# Patient Record
Sex: Male | Born: 1945 | Race: White | Hispanic: No | Marital: Married | Smoking: Former smoker
Health system: Southern US, Community
[De-identification: ages and names within clinical notes are randomized; demographics above are authoritative.]

## PROBLEM LIST (undated history)

## (undated) DIAGNOSIS — H35039 Hypertensive retinopathy, unspecified eye: Secondary | ICD-10-CM

## (undated) DIAGNOSIS — J449 Chronic obstructive pulmonary disease, unspecified: Secondary | ICD-10-CM

## (undated) DIAGNOSIS — M109 Gout, unspecified: Secondary | ICD-10-CM

## (undated) DIAGNOSIS — F419 Anxiety disorder, unspecified: Secondary | ICD-10-CM

## (undated) DIAGNOSIS — E119 Type 2 diabetes mellitus without complications: Secondary | ICD-10-CM

## (undated) DIAGNOSIS — M1990 Unspecified osteoarthritis, unspecified site: Secondary | ICD-10-CM

## (undated) DIAGNOSIS — I10 Essential (primary) hypertension: Secondary | ICD-10-CM

## (undated) DIAGNOSIS — C159 Malignant neoplasm of esophagus, unspecified: Secondary | ICD-10-CM

## (undated) DIAGNOSIS — E11319 Type 2 diabetes mellitus with unspecified diabetic retinopathy without macular edema: Secondary | ICD-10-CM

## (undated) DIAGNOSIS — K219 Gastro-esophageal reflux disease without esophagitis: Secondary | ICD-10-CM

## (undated) HISTORY — DX: Malignant neoplasm of esophagus, unspecified: C15.9

## (undated) HISTORY — DX: Unspecified osteoarthritis, unspecified site: M19.90

## (undated) HISTORY — PX: ROTATOR CUFF REPAIR: SHX139

## (undated) HISTORY — DX: Type 2 diabetes mellitus with unspecified diabetic retinopathy without macular edema: E11.319

## (undated) HISTORY — DX: Hypertensive retinopathy, unspecified eye: H35.039

## (undated) HISTORY — PX: KNEE ARTHROSCOPY: SUR90

## (undated) HISTORY — PX: NECK SURGERY: SHX720

---

## 2005-08-15 ENCOUNTER — Ambulatory Visit: Payer: Self-pay | Admitting: Cardiology

## 2012-09-16 DIAGNOSIS — I10 Essential (primary) hypertension: Secondary | ICD-10-CM | POA: Diagnosis not present

## 2012-09-16 DIAGNOSIS — J45901 Unspecified asthma with (acute) exacerbation: Secondary | ICD-10-CM | POA: Diagnosis not present

## 2012-09-23 DIAGNOSIS — E7800 Pure hypercholesterolemia, unspecified: Secondary | ICD-10-CM | POA: Diagnosis not present

## 2012-09-23 DIAGNOSIS — G609 Hereditary and idiopathic neuropathy, unspecified: Secondary | ICD-10-CM | POA: Diagnosis not present

## 2012-09-23 DIAGNOSIS — M129 Arthropathy, unspecified: Secondary | ICD-10-CM | POA: Diagnosis not present

## 2013-01-10 DIAGNOSIS — H6500 Acute serous otitis media, unspecified ear: Secondary | ICD-10-CM | POA: Diagnosis not present

## 2013-01-10 DIAGNOSIS — J019 Acute sinusitis, unspecified: Secondary | ICD-10-CM | POA: Diagnosis not present

## 2013-02-15 DIAGNOSIS — R21 Rash and other nonspecific skin eruption: Secondary | ICD-10-CM | POA: Diagnosis not present

## 2013-02-15 DIAGNOSIS — Z23 Encounter for immunization: Secondary | ICD-10-CM | POA: Diagnosis not present

## 2013-03-10 DIAGNOSIS — I10 Essential (primary) hypertension: Secondary | ICD-10-CM | POA: Diagnosis not present

## 2013-03-10 DIAGNOSIS — K219 Gastro-esophageal reflux disease without esophagitis: Secondary | ICD-10-CM | POA: Diagnosis not present

## 2013-03-10 DIAGNOSIS — E7800 Pure hypercholesterolemia, unspecified: Secondary | ICD-10-CM | POA: Diagnosis not present

## 2013-03-17 DIAGNOSIS — Z23 Encounter for immunization: Secondary | ICD-10-CM | POA: Diagnosis not present

## 2013-03-17 DIAGNOSIS — M129 Arthropathy, unspecified: Secondary | ICD-10-CM | POA: Diagnosis not present

## 2013-03-17 DIAGNOSIS — F329 Major depressive disorder, single episode, unspecified: Secondary | ICD-10-CM | POA: Diagnosis not present

## 2013-03-17 DIAGNOSIS — M109 Gout, unspecified: Secondary | ICD-10-CM | POA: Diagnosis not present

## 2013-03-17 DIAGNOSIS — F411 Generalized anxiety disorder: Secondary | ICD-10-CM | POA: Diagnosis not present

## 2013-03-17 DIAGNOSIS — IMO0001 Reserved for inherently not codable concepts without codable children: Secondary | ICD-10-CM | POA: Diagnosis not present

## 2013-04-11 DIAGNOSIS — H9190 Unspecified hearing loss, unspecified ear: Secondary | ICD-10-CM | POA: Diagnosis not present

## 2013-04-21 DIAGNOSIS — H903 Sensorineural hearing loss, bilateral: Secondary | ICD-10-CM | POA: Diagnosis not present

## 2013-06-28 DIAGNOSIS — IMO0001 Reserved for inherently not codable concepts without codable children: Secondary | ICD-10-CM | POA: Diagnosis not present

## 2013-06-28 DIAGNOSIS — G609 Hereditary and idiopathic neuropathy, unspecified: Secondary | ICD-10-CM | POA: Diagnosis not present

## 2013-06-28 DIAGNOSIS — M129 Arthropathy, unspecified: Secondary | ICD-10-CM | POA: Diagnosis not present

## 2013-07-07 DIAGNOSIS — J019 Acute sinusitis, unspecified: Secondary | ICD-10-CM | POA: Diagnosis not present

## 2013-07-07 DIAGNOSIS — R059 Cough, unspecified: Secondary | ICD-10-CM | POA: Diagnosis not present

## 2013-07-07 DIAGNOSIS — R05 Cough: Secondary | ICD-10-CM | POA: Diagnosis not present

## 2013-09-06 DIAGNOSIS — E7800 Pure hypercholesterolemia, unspecified: Secondary | ICD-10-CM | POA: Diagnosis not present

## 2013-09-06 DIAGNOSIS — I10 Essential (primary) hypertension: Secondary | ICD-10-CM | POA: Diagnosis not present

## 2013-09-06 DIAGNOSIS — IMO0001 Reserved for inherently not codable concepts without codable children: Secondary | ICD-10-CM | POA: Diagnosis not present

## 2013-09-13 DIAGNOSIS — M109 Gout, unspecified: Secondary | ICD-10-CM | POA: Diagnosis not present

## 2013-09-13 DIAGNOSIS — IMO0001 Reserved for inherently not codable concepts without codable children: Secondary | ICD-10-CM | POA: Diagnosis not present

## 2013-09-13 DIAGNOSIS — M129 Arthropathy, unspecified: Secondary | ICD-10-CM | POA: Diagnosis not present

## 2013-09-22 DIAGNOSIS — E669 Obesity, unspecified: Secondary | ICD-10-CM | POA: Diagnosis not present

## 2013-09-22 DIAGNOSIS — Z1211 Encounter for screening for malignant neoplasm of colon: Secondary | ICD-10-CM | POA: Diagnosis not present

## 2013-10-05 DIAGNOSIS — I10 Essential (primary) hypertension: Secondary | ICD-10-CM | POA: Diagnosis not present

## 2013-10-05 DIAGNOSIS — D128 Benign neoplasm of rectum: Secondary | ICD-10-CM | POA: Diagnosis not present

## 2013-10-05 DIAGNOSIS — E119 Type 2 diabetes mellitus without complications: Secondary | ICD-10-CM | POA: Diagnosis not present

## 2013-10-05 DIAGNOSIS — E669 Obesity, unspecified: Secondary | ICD-10-CM | POA: Diagnosis not present

## 2013-10-05 DIAGNOSIS — J45909 Unspecified asthma, uncomplicated: Secondary | ICD-10-CM | POA: Diagnosis not present

## 2013-10-05 DIAGNOSIS — F411 Generalized anxiety disorder: Secondary | ICD-10-CM | POA: Diagnosis not present

## 2013-10-05 DIAGNOSIS — Z1211 Encounter for screening for malignant neoplasm of colon: Secondary | ICD-10-CM | POA: Diagnosis not present

## 2013-10-05 DIAGNOSIS — D129 Benign neoplasm of anus and anal canal: Secondary | ICD-10-CM | POA: Diagnosis not present

## 2013-10-05 DIAGNOSIS — Z79899 Other long term (current) drug therapy: Secondary | ICD-10-CM | POA: Diagnosis not present

## 2013-10-05 DIAGNOSIS — D126 Benign neoplasm of colon, unspecified: Secondary | ICD-10-CM | POA: Diagnosis not present

## 2013-10-05 DIAGNOSIS — M109 Gout, unspecified: Secondary | ICD-10-CM | POA: Diagnosis not present

## 2013-11-01 DIAGNOSIS — D126 Benign neoplasm of colon, unspecified: Secondary | ICD-10-CM | POA: Diagnosis not present

## 2013-11-17 DIAGNOSIS — M1710 Unilateral primary osteoarthritis, unspecified knee: Secondary | ICD-10-CM | POA: Diagnosis not present

## 2013-11-17 DIAGNOSIS — M109 Gout, unspecified: Secondary | ICD-10-CM | POA: Diagnosis not present

## 2013-11-17 DIAGNOSIS — M25469 Effusion, unspecified knee: Secondary | ICD-10-CM | POA: Diagnosis not present

## 2013-11-17 DIAGNOSIS — M129 Arthropathy, unspecified: Secondary | ICD-10-CM | POA: Diagnosis not present

## 2013-11-24 DIAGNOSIS — M129 Arthropathy, unspecified: Secondary | ICD-10-CM | POA: Diagnosis not present

## 2013-11-24 DIAGNOSIS — M1710 Unilateral primary osteoarthritis, unspecified knee: Secondary | ICD-10-CM | POA: Diagnosis not present

## 2013-11-24 DIAGNOSIS — M25469 Effusion, unspecified knee: Secondary | ICD-10-CM | POA: Diagnosis not present

## 2013-11-24 DIAGNOSIS — M109 Gout, unspecified: Secondary | ICD-10-CM | POA: Diagnosis not present

## 2013-12-01 DIAGNOSIS — M25469 Effusion, unspecified knee: Secondary | ICD-10-CM | POA: Diagnosis not present

## 2013-12-01 DIAGNOSIS — M1710 Unilateral primary osteoarthritis, unspecified knee: Secondary | ICD-10-CM | POA: Diagnosis not present

## 2013-12-01 DIAGNOSIS — M109 Gout, unspecified: Secondary | ICD-10-CM | POA: Diagnosis not present

## 2013-12-01 DIAGNOSIS — M129 Arthropathy, unspecified: Secondary | ICD-10-CM | POA: Diagnosis not present

## 2013-12-21 DIAGNOSIS — G609 Hereditary and idiopathic neuropathy, unspecified: Secondary | ICD-10-CM | POA: Diagnosis not present

## 2013-12-21 DIAGNOSIS — M129 Arthropathy, unspecified: Secondary | ICD-10-CM | POA: Diagnosis not present

## 2013-12-21 DIAGNOSIS — M109 Gout, unspecified: Secondary | ICD-10-CM | POA: Diagnosis not present

## 2013-12-21 DIAGNOSIS — IMO0001 Reserved for inherently not codable concepts without codable children: Secondary | ICD-10-CM | POA: Diagnosis not present

## 2013-12-21 DIAGNOSIS — E7800 Pure hypercholesterolemia, unspecified: Secondary | ICD-10-CM | POA: Diagnosis not present

## 2014-02-14 DIAGNOSIS — M4722 Other spondylosis with radiculopathy, cervical region: Secondary | ICD-10-CM | POA: Diagnosis not present

## 2014-02-14 DIAGNOSIS — E11311 Type 2 diabetes mellitus with unspecified diabetic retinopathy with macular edema: Secondary | ICD-10-CM | POA: Diagnosis not present

## 2014-02-14 DIAGNOSIS — M7551 Bursitis of right shoulder: Secondary | ICD-10-CM | POA: Diagnosis not present

## 2014-02-17 DIAGNOSIS — M7551 Bursitis of right shoulder: Secondary | ICD-10-CM | POA: Diagnosis not present

## 2014-02-17 DIAGNOSIS — M25511 Pain in right shoulder: Secondary | ICD-10-CM | POA: Diagnosis not present

## 2014-02-17 DIAGNOSIS — M25611 Stiffness of right shoulder, not elsewhere classified: Secondary | ICD-10-CM | POA: Diagnosis not present

## 2014-02-17 DIAGNOSIS — M6281 Muscle weakness (generalized): Secondary | ICD-10-CM | POA: Diagnosis not present

## 2014-02-21 DIAGNOSIS — M25611 Stiffness of right shoulder, not elsewhere classified: Secondary | ICD-10-CM | POA: Diagnosis not present

## 2014-02-21 DIAGNOSIS — M7551 Bursitis of right shoulder: Secondary | ICD-10-CM | POA: Diagnosis not present

## 2014-02-21 DIAGNOSIS — M25511 Pain in right shoulder: Secondary | ICD-10-CM | POA: Diagnosis not present

## 2014-02-21 DIAGNOSIS — M6281 Muscle weakness (generalized): Secondary | ICD-10-CM | POA: Diagnosis not present

## 2014-02-23 DIAGNOSIS — M25611 Stiffness of right shoulder, not elsewhere classified: Secondary | ICD-10-CM | POA: Diagnosis not present

## 2014-02-23 DIAGNOSIS — M25511 Pain in right shoulder: Secondary | ICD-10-CM | POA: Diagnosis not present

## 2014-02-23 DIAGNOSIS — M6281 Muscle weakness (generalized): Secondary | ICD-10-CM | POA: Diagnosis not present

## 2014-02-23 DIAGNOSIS — M7551 Bursitis of right shoulder: Secondary | ICD-10-CM | POA: Diagnosis not present

## 2014-02-27 DIAGNOSIS — M7581 Other shoulder lesions, right shoulder: Secondary | ICD-10-CM | POA: Diagnosis not present

## 2014-02-27 DIAGNOSIS — M7551 Bursitis of right shoulder: Secondary | ICD-10-CM | POA: Diagnosis not present

## 2014-02-27 DIAGNOSIS — M19011 Primary osteoarthritis, right shoulder: Secondary | ICD-10-CM | POA: Diagnosis not present

## 2014-02-27 DIAGNOSIS — M25411 Effusion, right shoulder: Secondary | ICD-10-CM | POA: Diagnosis not present

## 2014-02-27 DIAGNOSIS — M6281 Muscle weakness (generalized): Secondary | ICD-10-CM | POA: Diagnosis not present

## 2014-02-27 DIAGNOSIS — M25611 Stiffness of right shoulder, not elsewhere classified: Secondary | ICD-10-CM | POA: Diagnosis not present

## 2014-02-27 DIAGNOSIS — M5032 Other cervical disc degeneration, mid-cervical region: Secondary | ICD-10-CM | POA: Diagnosis not present

## 2014-02-27 DIAGNOSIS — M542 Cervicalgia: Secondary | ICD-10-CM | POA: Diagnosis not present

## 2014-02-27 DIAGNOSIS — M25511 Pain in right shoulder: Secondary | ICD-10-CM | POA: Diagnosis not present

## 2014-03-06 DIAGNOSIS — M12811 Other specific arthropathies, not elsewhere classified, right shoulder: Secondary | ICD-10-CM | POA: Diagnosis not present

## 2014-03-06 DIAGNOSIS — M25411 Effusion, right shoulder: Secondary | ICD-10-CM | POA: Diagnosis not present

## 2014-03-06 DIAGNOSIS — S46811A Strain of other muscles, fascia and tendons at shoulder and upper arm level, right arm, initial encounter: Secondary | ICD-10-CM | POA: Diagnosis not present

## 2014-03-06 DIAGNOSIS — M25511 Pain in right shoulder: Secondary | ICD-10-CM | POA: Diagnosis not present

## 2014-03-06 DIAGNOSIS — S4381XA Sprain of other specified parts of right shoulder girdle, initial encounter: Secondary | ICD-10-CM | POA: Diagnosis not present

## 2014-03-06 DIAGNOSIS — M62511 Muscle wasting and atrophy, not elsewhere classified, right shoulder: Secondary | ICD-10-CM | POA: Diagnosis not present

## 2014-03-06 DIAGNOSIS — D1779 Benign lipomatous neoplasm of other sites: Secondary | ICD-10-CM | POA: Diagnosis not present

## 2014-03-07 DIAGNOSIS — E1165 Type 2 diabetes mellitus with hyperglycemia: Secondary | ICD-10-CM | POA: Diagnosis not present

## 2014-03-07 DIAGNOSIS — E780 Pure hypercholesterolemia: Secondary | ICD-10-CM | POA: Diagnosis not present

## 2014-03-07 DIAGNOSIS — I10 Essential (primary) hypertension: Secondary | ICD-10-CM | POA: Diagnosis not present

## 2014-03-13 DIAGNOSIS — E780 Pure hypercholesterolemia: Secondary | ICD-10-CM | POA: Diagnosis not present

## 2014-03-13 DIAGNOSIS — E1165 Type 2 diabetes mellitus with hyperglycemia: Secondary | ICD-10-CM | POA: Diagnosis not present

## 2014-03-13 DIAGNOSIS — M1000 Idiopathic gout, unspecified site: Secondary | ICD-10-CM | POA: Diagnosis not present

## 2014-03-13 DIAGNOSIS — Z1389 Encounter for screening for other disorder: Secondary | ICD-10-CM | POA: Diagnosis not present

## 2014-03-22 DIAGNOSIS — M75121 Complete rotator cuff tear or rupture of right shoulder, not specified as traumatic: Secondary | ICD-10-CM | POA: Diagnosis not present

## 2014-03-22 DIAGNOSIS — D1721 Benign lipomatous neoplasm of skin and subcutaneous tissue of right arm: Secondary | ICD-10-CM | POA: Diagnosis not present

## 2014-03-22 DIAGNOSIS — M47812 Spondylosis without myelopathy or radiculopathy, cervical region: Secondary | ICD-10-CM | POA: Diagnosis not present

## 2014-04-14 DIAGNOSIS — S4381XA Sprain of other specified parts of right shoulder girdle, initial encounter: Secondary | ICD-10-CM | POA: Diagnosis not present

## 2014-04-14 DIAGNOSIS — E119 Type 2 diabetes mellitus without complications: Secondary | ICD-10-CM | POA: Diagnosis not present

## 2014-04-14 DIAGNOSIS — M1990 Unspecified osteoarthritis, unspecified site: Secondary | ICD-10-CM | POA: Diagnosis not present

## 2014-04-14 DIAGNOSIS — E669 Obesity, unspecified: Secondary | ICD-10-CM | POA: Diagnosis not present

## 2014-04-14 DIAGNOSIS — S46111A Strain of muscle, fascia and tendon of long head of biceps, right arm, initial encounter: Secondary | ICD-10-CM | POA: Diagnosis not present

## 2014-04-14 DIAGNOSIS — J45909 Unspecified asthma, uncomplicated: Secondary | ICD-10-CM | POA: Diagnosis not present

## 2014-04-14 DIAGNOSIS — M19011 Primary osteoarthritis, right shoulder: Secondary | ICD-10-CM | POA: Diagnosis not present

## 2014-04-14 DIAGNOSIS — M47812 Spondylosis without myelopathy or radiculopathy, cervical region: Secondary | ICD-10-CM | POA: Diagnosis not present

## 2014-04-14 DIAGNOSIS — K589 Irritable bowel syndrome without diarrhea: Secondary | ICD-10-CM | POA: Diagnosis not present

## 2014-04-14 DIAGNOSIS — S46811A Strain of other muscles, fascia and tendons at shoulder and upper arm level, right arm, initial encounter: Secondary | ICD-10-CM | POA: Diagnosis not present

## 2014-04-14 DIAGNOSIS — J449 Chronic obstructive pulmonary disease, unspecified: Secondary | ICD-10-CM | POA: Diagnosis not present

## 2014-04-14 DIAGNOSIS — I10 Essential (primary) hypertension: Secondary | ICD-10-CM | POA: Diagnosis not present

## 2014-04-14 DIAGNOSIS — Z79899 Other long term (current) drug therapy: Secondary | ICD-10-CM | POA: Diagnosis not present

## 2014-04-14 DIAGNOSIS — D1721 Benign lipomatous neoplasm of skin and subcutaneous tissue of right arm: Secondary | ICD-10-CM | POA: Diagnosis not present

## 2014-04-14 DIAGNOSIS — Z6830 Body mass index (BMI) 30.0-30.9, adult: Secondary | ICD-10-CM | POA: Diagnosis not present

## 2014-04-14 DIAGNOSIS — S46211A Strain of muscle, fascia and tendon of other parts of biceps, right arm, initial encounter: Secondary | ICD-10-CM | POA: Diagnosis not present

## 2014-04-14 DIAGNOSIS — M75101 Unspecified rotator cuff tear or rupture of right shoulder, not specified as traumatic: Secondary | ICD-10-CM | POA: Diagnosis not present

## 2014-04-14 DIAGNOSIS — K219 Gastro-esophageal reflux disease without esophagitis: Secondary | ICD-10-CM | POA: Diagnosis not present

## 2014-04-14 DIAGNOSIS — M109 Gout, unspecified: Secondary | ICD-10-CM | POA: Diagnosis not present

## 2014-04-14 DIAGNOSIS — M75121 Complete rotator cuff tear or rupture of right shoulder, not specified as traumatic: Secondary | ICD-10-CM | POA: Diagnosis not present

## 2014-04-24 DIAGNOSIS — Z9889 Other specified postprocedural states: Secondary | ICD-10-CM | POA: Diagnosis not present

## 2014-04-24 DIAGNOSIS — M19011 Primary osteoarthritis, right shoulder: Secondary | ICD-10-CM | POA: Diagnosis not present

## 2014-04-25 DIAGNOSIS — M75101 Unspecified rotator cuff tear or rupture of right shoulder, not specified as traumatic: Secondary | ICD-10-CM | POA: Diagnosis not present

## 2014-04-25 DIAGNOSIS — M25511 Pain in right shoulder: Secondary | ICD-10-CM | POA: Diagnosis not present

## 2014-04-25 DIAGNOSIS — M6281 Muscle weakness (generalized): Secondary | ICD-10-CM | POA: Diagnosis not present

## 2014-04-27 DIAGNOSIS — M75101 Unspecified rotator cuff tear or rupture of right shoulder, not specified as traumatic: Secondary | ICD-10-CM | POA: Diagnosis not present

## 2014-04-27 DIAGNOSIS — M25511 Pain in right shoulder: Secondary | ICD-10-CM | POA: Diagnosis not present

## 2014-04-27 DIAGNOSIS — M6281 Muscle weakness (generalized): Secondary | ICD-10-CM | POA: Diagnosis not present

## 2014-05-01 DIAGNOSIS — M75101 Unspecified rotator cuff tear or rupture of right shoulder, not specified as traumatic: Secondary | ICD-10-CM | POA: Diagnosis not present

## 2014-05-01 DIAGNOSIS — M6281 Muscle weakness (generalized): Secondary | ICD-10-CM | POA: Diagnosis not present

## 2014-05-01 DIAGNOSIS — M25511 Pain in right shoulder: Secondary | ICD-10-CM | POA: Diagnosis not present

## 2014-05-03 DIAGNOSIS — M6281 Muscle weakness (generalized): Secondary | ICD-10-CM | POA: Diagnosis not present

## 2014-05-03 DIAGNOSIS — M75101 Unspecified rotator cuff tear or rupture of right shoulder, not specified as traumatic: Secondary | ICD-10-CM | POA: Diagnosis not present

## 2014-05-03 DIAGNOSIS — M25511 Pain in right shoulder: Secondary | ICD-10-CM | POA: Diagnosis not present

## 2014-05-05 DIAGNOSIS — M25511 Pain in right shoulder: Secondary | ICD-10-CM | POA: Diagnosis not present

## 2014-05-05 DIAGNOSIS — M75101 Unspecified rotator cuff tear or rupture of right shoulder, not specified as traumatic: Secondary | ICD-10-CM | POA: Diagnosis not present

## 2014-05-05 DIAGNOSIS — M6281 Muscle weakness (generalized): Secondary | ICD-10-CM | POA: Diagnosis not present

## 2014-05-08 DIAGNOSIS — M75101 Unspecified rotator cuff tear or rupture of right shoulder, not specified as traumatic: Secondary | ICD-10-CM | POA: Diagnosis not present

## 2014-05-08 DIAGNOSIS — M6281 Muscle weakness (generalized): Secondary | ICD-10-CM | POA: Diagnosis not present

## 2014-05-08 DIAGNOSIS — M25511 Pain in right shoulder: Secondary | ICD-10-CM | POA: Diagnosis not present

## 2014-05-10 DIAGNOSIS — M25511 Pain in right shoulder: Secondary | ICD-10-CM | POA: Diagnosis not present

## 2014-05-10 DIAGNOSIS — M6281 Muscle weakness (generalized): Secondary | ICD-10-CM | POA: Diagnosis not present

## 2014-05-10 DIAGNOSIS — M75101 Unspecified rotator cuff tear or rupture of right shoulder, not specified as traumatic: Secondary | ICD-10-CM | POA: Diagnosis not present

## 2014-05-12 DIAGNOSIS — M6281 Muscle weakness (generalized): Secondary | ICD-10-CM | POA: Diagnosis not present

## 2014-05-12 DIAGNOSIS — M25511 Pain in right shoulder: Secondary | ICD-10-CM | POA: Diagnosis not present

## 2014-05-12 DIAGNOSIS — M75101 Unspecified rotator cuff tear or rupture of right shoulder, not specified as traumatic: Secondary | ICD-10-CM | POA: Diagnosis not present

## 2014-05-15 DIAGNOSIS — M6281 Muscle weakness (generalized): Secondary | ICD-10-CM | POA: Diagnosis not present

## 2014-05-15 DIAGNOSIS — M25511 Pain in right shoulder: Secondary | ICD-10-CM | POA: Diagnosis not present

## 2014-05-15 DIAGNOSIS — M75101 Unspecified rotator cuff tear or rupture of right shoulder, not specified as traumatic: Secondary | ICD-10-CM | POA: Diagnosis not present

## 2014-05-17 DIAGNOSIS — M75101 Unspecified rotator cuff tear or rupture of right shoulder, not specified as traumatic: Secondary | ICD-10-CM | POA: Diagnosis not present

## 2014-05-17 DIAGNOSIS — M6281 Muscle weakness (generalized): Secondary | ICD-10-CM | POA: Diagnosis not present

## 2014-05-17 DIAGNOSIS — M25511 Pain in right shoulder: Secondary | ICD-10-CM | POA: Diagnosis not present

## 2014-05-19 DIAGNOSIS — M75101 Unspecified rotator cuff tear or rupture of right shoulder, not specified as traumatic: Secondary | ICD-10-CM | POA: Diagnosis not present

## 2014-05-19 DIAGNOSIS — M25511 Pain in right shoulder: Secondary | ICD-10-CM | POA: Diagnosis not present

## 2014-05-19 DIAGNOSIS — M6281 Muscle weakness (generalized): Secondary | ICD-10-CM | POA: Diagnosis not present

## 2014-05-24 DIAGNOSIS — M25511 Pain in right shoulder: Secondary | ICD-10-CM | POA: Diagnosis not present

## 2014-05-24 DIAGNOSIS — M75101 Unspecified rotator cuff tear or rupture of right shoulder, not specified as traumatic: Secondary | ICD-10-CM | POA: Diagnosis not present

## 2014-05-24 DIAGNOSIS — M6281 Muscle weakness (generalized): Secondary | ICD-10-CM | POA: Diagnosis not present

## 2014-05-29 DIAGNOSIS — M75101 Unspecified rotator cuff tear or rupture of right shoulder, not specified as traumatic: Secondary | ICD-10-CM | POA: Diagnosis not present

## 2014-05-29 DIAGNOSIS — M6281 Muscle weakness (generalized): Secondary | ICD-10-CM | POA: Diagnosis not present

## 2014-05-29 DIAGNOSIS — M25511 Pain in right shoulder: Secondary | ICD-10-CM | POA: Diagnosis not present

## 2014-05-31 DIAGNOSIS — M6281 Muscle weakness (generalized): Secondary | ICD-10-CM | POA: Diagnosis not present

## 2014-05-31 DIAGNOSIS — M75101 Unspecified rotator cuff tear or rupture of right shoulder, not specified as traumatic: Secondary | ICD-10-CM | POA: Diagnosis not present

## 2014-05-31 DIAGNOSIS — M25511 Pain in right shoulder: Secondary | ICD-10-CM | POA: Diagnosis not present

## 2014-06-02 DIAGNOSIS — M75101 Unspecified rotator cuff tear or rupture of right shoulder, not specified as traumatic: Secondary | ICD-10-CM | POA: Diagnosis not present

## 2014-06-02 DIAGNOSIS — M6281 Muscle weakness (generalized): Secondary | ICD-10-CM | POA: Diagnosis not present

## 2014-06-02 DIAGNOSIS — M25511 Pain in right shoulder: Secondary | ICD-10-CM | POA: Diagnosis not present

## 2014-06-06 DIAGNOSIS — R05 Cough: Secondary | ICD-10-CM | POA: Diagnosis not present

## 2014-06-06 DIAGNOSIS — J4520 Mild intermittent asthma, uncomplicated: Secondary | ICD-10-CM | POA: Diagnosis not present

## 2014-06-09 DIAGNOSIS — M75101 Unspecified rotator cuff tear or rupture of right shoulder, not specified as traumatic: Secondary | ICD-10-CM | POA: Diagnosis not present

## 2014-06-09 DIAGNOSIS — M25511 Pain in right shoulder: Secondary | ICD-10-CM | POA: Diagnosis not present

## 2014-06-09 DIAGNOSIS — M6281 Muscle weakness (generalized): Secondary | ICD-10-CM | POA: Diagnosis not present

## 2014-06-12 DIAGNOSIS — M25511 Pain in right shoulder: Secondary | ICD-10-CM | POA: Diagnosis not present

## 2014-06-12 DIAGNOSIS — M75101 Unspecified rotator cuff tear or rupture of right shoulder, not specified as traumatic: Secondary | ICD-10-CM | POA: Diagnosis not present

## 2014-06-12 DIAGNOSIS — M6281 Muscle weakness (generalized): Secondary | ICD-10-CM | POA: Diagnosis not present

## 2014-06-14 DIAGNOSIS — M25511 Pain in right shoulder: Secondary | ICD-10-CM | POA: Diagnosis not present

## 2014-06-14 DIAGNOSIS — M75101 Unspecified rotator cuff tear or rupture of right shoulder, not specified as traumatic: Secondary | ICD-10-CM | POA: Diagnosis not present

## 2014-06-14 DIAGNOSIS — M6281 Muscle weakness (generalized): Secondary | ICD-10-CM | POA: Diagnosis not present

## 2014-06-16 DIAGNOSIS — M25511 Pain in right shoulder: Secondary | ICD-10-CM | POA: Diagnosis not present

## 2014-06-16 DIAGNOSIS — M6281 Muscle weakness (generalized): Secondary | ICD-10-CM | POA: Diagnosis not present

## 2014-06-16 DIAGNOSIS — M75101 Unspecified rotator cuff tear or rupture of right shoulder, not specified as traumatic: Secondary | ICD-10-CM | POA: Diagnosis not present

## 2014-06-20 DIAGNOSIS — M75101 Unspecified rotator cuff tear or rupture of right shoulder, not specified as traumatic: Secondary | ICD-10-CM | POA: Diagnosis not present

## 2014-06-20 DIAGNOSIS — M25511 Pain in right shoulder: Secondary | ICD-10-CM | POA: Diagnosis not present

## 2014-06-20 DIAGNOSIS — M6281 Muscle weakness (generalized): Secondary | ICD-10-CM | POA: Diagnosis not present

## 2014-06-21 DIAGNOSIS — M25511 Pain in right shoulder: Secondary | ICD-10-CM | POA: Diagnosis not present

## 2014-06-21 DIAGNOSIS — M75101 Unspecified rotator cuff tear or rupture of right shoulder, not specified as traumatic: Secondary | ICD-10-CM | POA: Diagnosis not present

## 2014-06-21 DIAGNOSIS — M6281 Muscle weakness (generalized): Secondary | ICD-10-CM | POA: Diagnosis not present

## 2014-06-23 DIAGNOSIS — M6281 Muscle weakness (generalized): Secondary | ICD-10-CM | POA: Diagnosis not present

## 2014-06-23 DIAGNOSIS — M25511 Pain in right shoulder: Secondary | ICD-10-CM | POA: Diagnosis not present

## 2014-06-23 DIAGNOSIS — M75101 Unspecified rotator cuff tear or rupture of right shoulder, not specified as traumatic: Secondary | ICD-10-CM | POA: Diagnosis not present

## 2014-06-27 DIAGNOSIS — M6281 Muscle weakness (generalized): Secondary | ICD-10-CM | POA: Diagnosis not present

## 2014-06-27 DIAGNOSIS — M25511 Pain in right shoulder: Secondary | ICD-10-CM | POA: Diagnosis not present

## 2014-06-27 DIAGNOSIS — M75101 Unspecified rotator cuff tear or rupture of right shoulder, not specified as traumatic: Secondary | ICD-10-CM | POA: Diagnosis not present

## 2014-06-29 DIAGNOSIS — M6281 Muscle weakness (generalized): Secondary | ICD-10-CM | POA: Diagnosis not present

## 2014-06-29 DIAGNOSIS — M75101 Unspecified rotator cuff tear or rupture of right shoulder, not specified as traumatic: Secondary | ICD-10-CM | POA: Diagnosis not present

## 2014-06-29 DIAGNOSIS — M25511 Pain in right shoulder: Secondary | ICD-10-CM | POA: Diagnosis not present

## 2014-07-03 DIAGNOSIS — M6281 Muscle weakness (generalized): Secondary | ICD-10-CM | POA: Diagnosis not present

## 2014-07-03 DIAGNOSIS — M25511 Pain in right shoulder: Secondary | ICD-10-CM | POA: Diagnosis not present

## 2014-07-03 DIAGNOSIS — M75101 Unspecified rotator cuff tear or rupture of right shoulder, not specified as traumatic: Secondary | ICD-10-CM | POA: Diagnosis not present

## 2014-07-05 DIAGNOSIS — M6281 Muscle weakness (generalized): Secondary | ICD-10-CM | POA: Diagnosis not present

## 2014-07-05 DIAGNOSIS — M75101 Unspecified rotator cuff tear or rupture of right shoulder, not specified as traumatic: Secondary | ICD-10-CM | POA: Diagnosis not present

## 2014-07-05 DIAGNOSIS — M25511 Pain in right shoulder: Secondary | ICD-10-CM | POA: Diagnosis not present

## 2014-07-07 DIAGNOSIS — M25511 Pain in right shoulder: Secondary | ICD-10-CM | POA: Diagnosis not present

## 2014-07-07 DIAGNOSIS — M75101 Unspecified rotator cuff tear or rupture of right shoulder, not specified as traumatic: Secondary | ICD-10-CM | POA: Diagnosis not present

## 2014-07-07 DIAGNOSIS — M6281 Muscle weakness (generalized): Secondary | ICD-10-CM | POA: Diagnosis not present

## 2014-07-10 DIAGNOSIS — M6281 Muscle weakness (generalized): Secondary | ICD-10-CM | POA: Diagnosis not present

## 2014-07-10 DIAGNOSIS — M75101 Unspecified rotator cuff tear or rupture of right shoulder, not specified as traumatic: Secondary | ICD-10-CM | POA: Diagnosis not present

## 2014-07-10 DIAGNOSIS — M25511 Pain in right shoulder: Secondary | ICD-10-CM | POA: Diagnosis not present

## 2014-07-12 DIAGNOSIS — M6281 Muscle weakness (generalized): Secondary | ICD-10-CM | POA: Diagnosis not present

## 2014-07-12 DIAGNOSIS — M25511 Pain in right shoulder: Secondary | ICD-10-CM | POA: Diagnosis not present

## 2014-07-12 DIAGNOSIS — M75101 Unspecified rotator cuff tear or rupture of right shoulder, not specified as traumatic: Secondary | ICD-10-CM | POA: Diagnosis not present

## 2014-07-14 DIAGNOSIS — M75101 Unspecified rotator cuff tear or rupture of right shoulder, not specified as traumatic: Secondary | ICD-10-CM | POA: Diagnosis not present

## 2014-07-14 DIAGNOSIS — M6281 Muscle weakness (generalized): Secondary | ICD-10-CM | POA: Diagnosis not present

## 2014-07-14 DIAGNOSIS — M25511 Pain in right shoulder: Secondary | ICD-10-CM | POA: Diagnosis not present

## 2014-07-17 DIAGNOSIS — M75101 Unspecified rotator cuff tear or rupture of right shoulder, not specified as traumatic: Secondary | ICD-10-CM | POA: Diagnosis not present

## 2014-07-17 DIAGNOSIS — M25511 Pain in right shoulder: Secondary | ICD-10-CM | POA: Diagnosis not present

## 2014-07-17 DIAGNOSIS — M6281 Muscle weakness (generalized): Secondary | ICD-10-CM | POA: Diagnosis not present

## 2014-07-19 DIAGNOSIS — M6281 Muscle weakness (generalized): Secondary | ICD-10-CM | POA: Diagnosis not present

## 2014-07-19 DIAGNOSIS — M75101 Unspecified rotator cuff tear or rupture of right shoulder, not specified as traumatic: Secondary | ICD-10-CM | POA: Diagnosis not present

## 2014-07-19 DIAGNOSIS — M25511 Pain in right shoulder: Secondary | ICD-10-CM | POA: Diagnosis not present

## 2014-07-21 DIAGNOSIS — M6281 Muscle weakness (generalized): Secondary | ICD-10-CM | POA: Diagnosis not present

## 2014-07-21 DIAGNOSIS — M75101 Unspecified rotator cuff tear or rupture of right shoulder, not specified as traumatic: Secondary | ICD-10-CM | POA: Diagnosis not present

## 2014-07-21 DIAGNOSIS — M25511 Pain in right shoulder: Secondary | ICD-10-CM | POA: Diagnosis not present

## 2014-07-24 DIAGNOSIS — M6281 Muscle weakness (generalized): Secondary | ICD-10-CM | POA: Diagnosis not present

## 2014-07-24 DIAGNOSIS — M25511 Pain in right shoulder: Secondary | ICD-10-CM | POA: Diagnosis not present

## 2014-07-24 DIAGNOSIS — M75101 Unspecified rotator cuff tear or rupture of right shoulder, not specified as traumatic: Secondary | ICD-10-CM | POA: Diagnosis not present

## 2014-07-26 DIAGNOSIS — M75101 Unspecified rotator cuff tear or rupture of right shoulder, not specified as traumatic: Secondary | ICD-10-CM | POA: Diagnosis not present

## 2014-07-26 DIAGNOSIS — M6281 Muscle weakness (generalized): Secondary | ICD-10-CM | POA: Diagnosis not present

## 2014-07-26 DIAGNOSIS — M25511 Pain in right shoulder: Secondary | ICD-10-CM | POA: Diagnosis not present

## 2014-08-10 DIAGNOSIS — Z9889 Other specified postprocedural states: Secondary | ICD-10-CM | POA: Diagnosis not present

## 2014-09-28 DIAGNOSIS — E1165 Type 2 diabetes mellitus with hyperglycemia: Secondary | ICD-10-CM | POA: Diagnosis not present

## 2014-09-28 DIAGNOSIS — M129 Arthropathy, unspecified: Secondary | ICD-10-CM | POA: Diagnosis not present

## 2014-09-28 DIAGNOSIS — M1000 Idiopathic gout, unspecified site: Secondary | ICD-10-CM | POA: Diagnosis not present

## 2014-09-28 DIAGNOSIS — I10 Essential (primary) hypertension: Secondary | ICD-10-CM | POA: Diagnosis not present

## 2014-09-28 DIAGNOSIS — G629 Polyneuropathy, unspecified: Secondary | ICD-10-CM | POA: Diagnosis not present

## 2014-11-01 DIAGNOSIS — M25461 Effusion, right knee: Secondary | ICD-10-CM | POA: Diagnosis not present

## 2014-11-01 DIAGNOSIS — M1711 Unilateral primary osteoarthritis, right knee: Secondary | ICD-10-CM | POA: Diagnosis not present

## 2014-12-04 DIAGNOSIS — M4692 Unspecified inflammatory spondylopathy, cervical region: Secondary | ICD-10-CM | POA: Diagnosis not present

## 2014-12-12 DIAGNOSIS — M542 Cervicalgia: Secondary | ICD-10-CM | POA: Diagnosis not present

## 2014-12-12 DIAGNOSIS — M791 Myalgia: Secondary | ICD-10-CM | POA: Diagnosis not present

## 2014-12-14 DIAGNOSIS — M542 Cervicalgia: Secondary | ICD-10-CM | POA: Diagnosis not present

## 2014-12-14 DIAGNOSIS — M791 Myalgia: Secondary | ICD-10-CM | POA: Diagnosis not present

## 2014-12-19 DIAGNOSIS — M791 Myalgia: Secondary | ICD-10-CM | POA: Diagnosis not present

## 2014-12-19 DIAGNOSIS — M542 Cervicalgia: Secondary | ICD-10-CM | POA: Diagnosis not present

## 2014-12-21 DIAGNOSIS — M542 Cervicalgia: Secondary | ICD-10-CM | POA: Diagnosis not present

## 2014-12-21 DIAGNOSIS — M791 Myalgia: Secondary | ICD-10-CM | POA: Diagnosis not present

## 2014-12-26 DIAGNOSIS — M542 Cervicalgia: Secondary | ICD-10-CM | POA: Diagnosis not present

## 2014-12-26 DIAGNOSIS — M791 Myalgia: Secondary | ICD-10-CM | POA: Diagnosis not present

## 2014-12-28 DIAGNOSIS — M542 Cervicalgia: Secondary | ICD-10-CM | POA: Diagnosis not present

## 2014-12-28 DIAGNOSIS — M791 Myalgia: Secondary | ICD-10-CM | POA: Diagnosis not present

## 2015-01-01 DIAGNOSIS — E780 Pure hypercholesterolemia: Secondary | ICD-10-CM | POA: Diagnosis not present

## 2015-01-01 DIAGNOSIS — K219 Gastro-esophageal reflux disease without esophagitis: Secondary | ICD-10-CM | POA: Diagnosis not present

## 2015-01-01 DIAGNOSIS — E1165 Type 2 diabetes mellitus with hyperglycemia: Secondary | ICD-10-CM | POA: Diagnosis not present

## 2015-01-01 DIAGNOSIS — I10 Essential (primary) hypertension: Secondary | ICD-10-CM | POA: Diagnosis not present

## 2015-01-02 DIAGNOSIS — M791 Myalgia: Secondary | ICD-10-CM | POA: Diagnosis not present

## 2015-01-02 DIAGNOSIS — M542 Cervicalgia: Secondary | ICD-10-CM | POA: Diagnosis not present

## 2015-01-04 DIAGNOSIS — M542 Cervicalgia: Secondary | ICD-10-CM | POA: Diagnosis not present

## 2015-01-04 DIAGNOSIS — M791 Myalgia: Secondary | ICD-10-CM | POA: Diagnosis not present

## 2015-01-05 DIAGNOSIS — K219 Gastro-esophageal reflux disease without esophagitis: Secondary | ICD-10-CM | POA: Diagnosis not present

## 2015-01-05 DIAGNOSIS — W01198A Fall on same level from slipping, tripping and stumbling with subsequent striking against other object, initial encounter: Secondary | ICD-10-CM | POA: Diagnosis not present

## 2015-01-05 DIAGNOSIS — E119 Type 2 diabetes mellitus without complications: Secondary | ICD-10-CM | POA: Diagnosis not present

## 2015-01-05 DIAGNOSIS — M25551 Pain in right hip: Secondary | ICD-10-CM | POA: Diagnosis not present

## 2015-01-05 DIAGNOSIS — J45909 Unspecified asthma, uncomplicated: Secondary | ICD-10-CM | POA: Diagnosis not present

## 2015-01-05 DIAGNOSIS — S7001XA Contusion of right hip, initial encounter: Secondary | ICD-10-CM | POA: Diagnosis not present

## 2015-01-05 DIAGNOSIS — I10 Essential (primary) hypertension: Secondary | ICD-10-CM | POA: Diagnosis not present

## 2015-01-05 DIAGNOSIS — Z79899 Other long term (current) drug therapy: Secondary | ICD-10-CM | POA: Diagnosis not present

## 2015-01-09 DIAGNOSIS — M1000 Idiopathic gout, unspecified site: Secondary | ICD-10-CM | POA: Diagnosis not present

## 2015-01-09 DIAGNOSIS — F3289 Other specified depressive episodes: Secondary | ICD-10-CM | POA: Diagnosis not present

## 2015-01-09 DIAGNOSIS — I10 Essential (primary) hypertension: Secondary | ICD-10-CM | POA: Diagnosis not present

## 2015-01-09 DIAGNOSIS — Z13220 Encounter for screening for lipoid disorders: Secondary | ICD-10-CM | POA: Diagnosis not present

## 2015-01-09 DIAGNOSIS — K219 Gastro-esophageal reflux disease without esophagitis: Secondary | ICD-10-CM | POA: Diagnosis not present

## 2015-01-09 DIAGNOSIS — M129 Arthropathy, unspecified: Secondary | ICD-10-CM | POA: Diagnosis not present

## 2015-01-09 DIAGNOSIS — E1165 Type 2 diabetes mellitus with hyperglycemia: Secondary | ICD-10-CM | POA: Diagnosis not present

## 2015-01-09 DIAGNOSIS — M791 Myalgia: Secondary | ICD-10-CM | POA: Diagnosis not present

## 2015-01-09 DIAGNOSIS — M542 Cervicalgia: Secondary | ICD-10-CM | POA: Diagnosis not present

## 2015-01-09 DIAGNOSIS — Z0001 Encounter for general adult medical examination with abnormal findings: Secondary | ICD-10-CM | POA: Diagnosis not present

## 2015-01-09 DIAGNOSIS — F419 Anxiety disorder, unspecified: Secondary | ICD-10-CM | POA: Diagnosis not present

## 2015-02-01 DIAGNOSIS — M25461 Effusion, right knee: Secondary | ICD-10-CM | POA: Diagnosis not present

## 2015-02-01 DIAGNOSIS — M1711 Unilateral primary osteoarthritis, right knee: Secondary | ICD-10-CM | POA: Diagnosis not present

## 2015-02-08 DIAGNOSIS — M25461 Effusion, right knee: Secondary | ICD-10-CM | POA: Diagnosis not present

## 2015-02-08 DIAGNOSIS — M1711 Unilateral primary osteoarthritis, right knee: Secondary | ICD-10-CM | POA: Diagnosis not present

## 2015-02-08 DIAGNOSIS — E1165 Type 2 diabetes mellitus with hyperglycemia: Secondary | ICD-10-CM | POA: Diagnosis not present

## 2015-02-12 DIAGNOSIS — M1711 Unilateral primary osteoarthritis, right knee: Secondary | ICD-10-CM | POA: Diagnosis not present

## 2015-02-15 DIAGNOSIS — E1165 Type 2 diabetes mellitus with hyperglycemia: Secondary | ICD-10-CM | POA: Diagnosis not present

## 2015-02-15 DIAGNOSIS — M25461 Effusion, right knee: Secondary | ICD-10-CM | POA: Diagnosis not present

## 2015-02-15 DIAGNOSIS — M1711 Unilateral primary osteoarthritis, right knee: Secondary | ICD-10-CM | POA: Diagnosis not present

## 2015-03-07 DIAGNOSIS — M47812 Spondylosis without myelopathy or radiculopathy, cervical region: Secondary | ICD-10-CM | POA: Diagnosis not present

## 2015-03-07 DIAGNOSIS — Q7649 Other congenital malformations of spine, not associated with scoliosis: Secondary | ICD-10-CM | POA: Diagnosis not present

## 2015-03-07 DIAGNOSIS — M5030 Other cervical disc degeneration, unspecified cervical region: Secondary | ICD-10-CM | POA: Diagnosis not present

## 2015-03-07 DIAGNOSIS — M50223 Other cervical disc displacement at C6-C7 level: Secondary | ICD-10-CM | POA: Diagnosis not present

## 2015-03-07 DIAGNOSIS — M9981 Other biomechanical lesions of cervical region: Secondary | ICD-10-CM | POA: Diagnosis not present

## 2015-03-19 DIAGNOSIS — M4802 Spinal stenosis, cervical region: Secondary | ICD-10-CM | POA: Diagnosis not present

## 2015-03-19 DIAGNOSIS — M5020 Other cervical disc displacement, unspecified cervical region: Secondary | ICD-10-CM | POA: Diagnosis not present

## 2015-03-19 DIAGNOSIS — M4722 Other spondylosis with radiculopathy, cervical region: Secondary | ICD-10-CM | POA: Diagnosis not present

## 2015-03-21 DIAGNOSIS — H8112 Benign paroxysmal vertigo, left ear: Secondary | ICD-10-CM | POA: Diagnosis not present

## 2015-03-21 DIAGNOSIS — R42 Dizziness and giddiness: Secondary | ICD-10-CM | POA: Diagnosis not present

## 2015-03-26 DIAGNOSIS — R42 Dizziness and giddiness: Secondary | ICD-10-CM | POA: Diagnosis not present

## 2015-04-13 DIAGNOSIS — M1711 Unilateral primary osteoarthritis, right knee: Secondary | ICD-10-CM | POA: Diagnosis not present

## 2015-04-13 DIAGNOSIS — Z13220 Encounter for screening for lipoid disorders: Secondary | ICD-10-CM | POA: Diagnosis not present

## 2015-04-13 DIAGNOSIS — M1000 Idiopathic gout, unspecified site: Secondary | ICD-10-CM | POA: Diagnosis not present

## 2015-04-13 DIAGNOSIS — E1165 Type 2 diabetes mellitus with hyperglycemia: Secondary | ICD-10-CM | POA: Diagnosis not present

## 2015-04-13 DIAGNOSIS — G629 Polyneuropathy, unspecified: Secondary | ICD-10-CM | POA: Diagnosis not present

## 2015-04-13 DIAGNOSIS — Z1389 Encounter for screening for other disorder: Secondary | ICD-10-CM | POA: Diagnosis not present

## 2015-04-13 DIAGNOSIS — M4692 Unspecified inflammatory spondylopathy, cervical region: Secondary | ICD-10-CM | POA: Diagnosis not present

## 2015-04-16 DIAGNOSIS — Z01818 Encounter for other preprocedural examination: Secondary | ICD-10-CM | POA: Diagnosis not present

## 2015-04-18 DIAGNOSIS — M109 Gout, unspecified: Secondary | ICD-10-CM | POA: Diagnosis not present

## 2015-04-18 DIAGNOSIS — M4802 Spinal stenosis, cervical region: Secondary | ICD-10-CM | POA: Diagnosis not present

## 2015-04-18 DIAGNOSIS — Z79899 Other long term (current) drug therapy: Secondary | ICD-10-CM | POA: Diagnosis not present

## 2015-04-18 DIAGNOSIS — E1165 Type 2 diabetes mellitus with hyperglycemia: Secondary | ICD-10-CM | POA: Diagnosis not present

## 2015-04-18 DIAGNOSIS — K589 Irritable bowel syndrome without diarrhea: Secondary | ICD-10-CM | POA: Diagnosis present

## 2015-04-18 DIAGNOSIS — E119 Type 2 diabetes mellitus without complications: Secondary | ICD-10-CM | POA: Diagnosis not present

## 2015-04-18 DIAGNOSIS — M47812 Spondylosis without myelopathy or radiculopathy, cervical region: Secondary | ICD-10-CM | POA: Diagnosis not present

## 2015-04-18 DIAGNOSIS — Z7984 Long term (current) use of oral hypoglycemic drugs: Secondary | ICD-10-CM | POA: Diagnosis not present

## 2015-04-18 DIAGNOSIS — R221 Localized swelling, mass and lump, neck: Secondary | ICD-10-CM | POA: Diagnosis not present

## 2015-04-18 DIAGNOSIS — R1310 Dysphagia, unspecified: Secondary | ICD-10-CM | POA: Diagnosis not present

## 2015-04-18 DIAGNOSIS — M4722 Other spondylosis with radiculopathy, cervical region: Secondary | ICD-10-CM | POA: Diagnosis not present

## 2015-04-18 DIAGNOSIS — M4322 Fusion of spine, cervical region: Secondary | ICD-10-CM | POA: Diagnosis not present

## 2015-04-18 DIAGNOSIS — I10 Essential (primary) hypertension: Secondary | ICD-10-CM | POA: Diagnosis not present

## 2015-04-18 DIAGNOSIS — M1990 Unspecified osteoarthritis, unspecified site: Secondary | ICD-10-CM | POA: Diagnosis not present

## 2015-04-27 DIAGNOSIS — M4722 Other spondylosis with radiculopathy, cervical region: Secondary | ICD-10-CM | POA: Diagnosis not present

## 2015-04-27 DIAGNOSIS — M4322 Fusion of spine, cervical region: Secondary | ICD-10-CM | POA: Diagnosis not present

## 2015-04-27 DIAGNOSIS — Z981 Arthrodesis status: Secondary | ICD-10-CM | POA: Diagnosis not present

## 2015-05-25 DIAGNOSIS — M5000 Cervical disc disorder with myelopathy, unspecified cervical region: Secondary | ICD-10-CM | POA: Diagnosis not present

## 2015-05-25 DIAGNOSIS — G629 Polyneuropathy, unspecified: Secondary | ICD-10-CM | POA: Diagnosis not present

## 2015-05-25 DIAGNOSIS — E1165 Type 2 diabetes mellitus with hyperglycemia: Secondary | ICD-10-CM | POA: Diagnosis not present

## 2015-07-09 DIAGNOSIS — K219 Gastro-esophageal reflux disease without esophagitis: Secondary | ICD-10-CM | POA: Diagnosis not present

## 2015-07-09 DIAGNOSIS — E1165 Type 2 diabetes mellitus with hyperglycemia: Secondary | ICD-10-CM | POA: Diagnosis not present

## 2015-07-09 DIAGNOSIS — I10 Essential (primary) hypertension: Secondary | ICD-10-CM | POA: Diagnosis not present

## 2015-07-09 DIAGNOSIS — M1000 Idiopathic gout, unspecified site: Secondary | ICD-10-CM | POA: Diagnosis not present

## 2015-07-09 DIAGNOSIS — E7800 Pure hypercholesterolemia, unspecified: Secondary | ICD-10-CM | POA: Diagnosis not present

## 2015-07-12 DIAGNOSIS — F419 Anxiety disorder, unspecified: Secondary | ICD-10-CM | POA: Diagnosis not present

## 2015-07-12 DIAGNOSIS — Z13220 Encounter for screening for lipoid disorders: Secondary | ICD-10-CM | POA: Diagnosis not present

## 2015-07-12 DIAGNOSIS — E1165 Type 2 diabetes mellitus with hyperglycemia: Secondary | ICD-10-CM | POA: Diagnosis not present

## 2015-07-12 DIAGNOSIS — I10 Essential (primary) hypertension: Secondary | ICD-10-CM | POA: Diagnosis not present

## 2015-07-12 DIAGNOSIS — H8112 Benign paroxysmal vertigo, left ear: Secondary | ICD-10-CM | POA: Diagnosis not present

## 2015-07-12 DIAGNOSIS — G629 Polyneuropathy, unspecified: Secondary | ICD-10-CM | POA: Diagnosis not present

## 2015-07-12 DIAGNOSIS — M1000 Idiopathic gout, unspecified site: Secondary | ICD-10-CM | POA: Diagnosis not present

## 2015-08-11 DIAGNOSIS — J4520 Mild intermittent asthma, uncomplicated: Secondary | ICD-10-CM | POA: Diagnosis not present

## 2015-08-11 DIAGNOSIS — I10 Essential (primary) hypertension: Secondary | ICD-10-CM | POA: Diagnosis not present

## 2015-08-11 DIAGNOSIS — J209 Acute bronchitis, unspecified: Secondary | ICD-10-CM | POA: Diagnosis not present

## 2015-08-21 DIAGNOSIS — J0101 Acute recurrent maxillary sinusitis: Secondary | ICD-10-CM | POA: Diagnosis not present

## 2015-09-10 DIAGNOSIS — J019 Acute sinusitis, unspecified: Secondary | ICD-10-CM | POA: Diagnosis not present

## 2015-09-13 DIAGNOSIS — M4722 Other spondylosis with radiculopathy, cervical region: Secondary | ICD-10-CM | POA: Diagnosis not present

## 2015-09-13 DIAGNOSIS — M5020 Other cervical disc displacement, unspecified cervical region: Secondary | ICD-10-CM | POA: Diagnosis not present

## 2015-09-14 DIAGNOSIS — R05 Cough: Secondary | ICD-10-CM | POA: Diagnosis not present

## 2015-09-28 DIAGNOSIS — I10 Essential (primary) hypertension: Secondary | ICD-10-CM | POA: Diagnosis not present

## 2015-09-28 DIAGNOSIS — E7800 Pure hypercholesterolemia, unspecified: Secondary | ICD-10-CM | POA: Diagnosis not present

## 2015-09-28 DIAGNOSIS — E1165 Type 2 diabetes mellitus with hyperglycemia: Secondary | ICD-10-CM | POA: Diagnosis not present

## 2015-09-28 DIAGNOSIS — K219 Gastro-esophageal reflux disease without esophagitis: Secondary | ICD-10-CM | POA: Diagnosis not present

## 2015-10-02 DIAGNOSIS — E1165 Type 2 diabetes mellitus with hyperglycemia: Secondary | ICD-10-CM | POA: Diagnosis not present

## 2015-10-02 DIAGNOSIS — G629 Polyneuropathy, unspecified: Secondary | ICD-10-CM | POA: Diagnosis not present

## 2015-10-02 DIAGNOSIS — Z13220 Encounter for screening for lipoid disorders: Secondary | ICD-10-CM | POA: Diagnosis not present

## 2015-10-02 DIAGNOSIS — I10 Essential (primary) hypertension: Secondary | ICD-10-CM | POA: Diagnosis not present

## 2015-10-02 DIAGNOSIS — F419 Anxiety disorder, unspecified: Secondary | ICD-10-CM | POA: Diagnosis not present

## 2015-10-02 DIAGNOSIS — M4722 Other spondylosis with radiculopathy, cervical region: Secondary | ICD-10-CM | POA: Diagnosis not present

## 2015-10-02 DIAGNOSIS — F3289 Other specified depressive episodes: Secondary | ICD-10-CM | POA: Diagnosis not present

## 2015-10-02 DIAGNOSIS — M1000 Idiopathic gout, unspecified site: Secondary | ICD-10-CM | POA: Diagnosis not present

## 2015-10-18 DIAGNOSIS — M1611 Unilateral primary osteoarthritis, right hip: Secondary | ICD-10-CM | POA: Diagnosis not present

## 2015-10-18 DIAGNOSIS — Z6832 Body mass index (BMI) 32.0-32.9, adult: Secondary | ICD-10-CM | POA: Diagnosis not present

## 2015-10-18 DIAGNOSIS — M1711 Unilateral primary osteoarthritis, right knee: Secondary | ICD-10-CM | POA: Diagnosis not present

## 2015-11-01 DIAGNOSIS — M1711 Unilateral primary osteoarthritis, right knee: Secondary | ICD-10-CM | POA: Diagnosis not present

## 2015-11-01 DIAGNOSIS — M1611 Unilateral primary osteoarthritis, right hip: Secondary | ICD-10-CM | POA: Diagnosis not present

## 2015-11-08 DIAGNOSIS — M1611 Unilateral primary osteoarthritis, right hip: Secondary | ICD-10-CM | POA: Diagnosis not present

## 2015-11-08 DIAGNOSIS — M1711 Unilateral primary osteoarthritis, right knee: Secondary | ICD-10-CM | POA: Diagnosis not present

## 2015-11-15 DIAGNOSIS — M1711 Unilateral primary osteoarthritis, right knee: Secondary | ICD-10-CM | POA: Diagnosis not present

## 2015-11-15 DIAGNOSIS — M1611 Unilateral primary osteoarthritis, right hip: Secondary | ICD-10-CM | POA: Diagnosis not present

## 2015-12-06 ENCOUNTER — Other Ambulatory Visit: Payer: Self-pay

## 2015-12-28 DIAGNOSIS — G609 Hereditary and idiopathic neuropathy, unspecified: Secondary | ICD-10-CM | POA: Diagnosis not present

## 2015-12-28 DIAGNOSIS — M4722 Other spondylosis with radiculopathy, cervical region: Secondary | ICD-10-CM | POA: Diagnosis not present

## 2016-01-01 DIAGNOSIS — Z13220 Encounter for screening for lipoid disorders: Secondary | ICD-10-CM | POA: Diagnosis not present

## 2016-01-01 DIAGNOSIS — E1165 Type 2 diabetes mellitus with hyperglycemia: Secondary | ICD-10-CM | POA: Diagnosis not present

## 2016-01-01 DIAGNOSIS — I10 Essential (primary) hypertension: Secondary | ICD-10-CM | POA: Diagnosis not present

## 2016-01-01 DIAGNOSIS — K219 Gastro-esophageal reflux disease without esophagitis: Secondary | ICD-10-CM | POA: Diagnosis not present

## 2016-01-01 DIAGNOSIS — E7800 Pure hypercholesterolemia, unspecified: Secondary | ICD-10-CM | POA: Diagnosis not present

## 2016-01-03 DIAGNOSIS — E11610 Type 2 diabetes mellitus with diabetic neuropathic arthropathy: Secondary | ICD-10-CM | POA: Diagnosis not present

## 2016-01-03 DIAGNOSIS — F3289 Other specified depressive episodes: Secondary | ICD-10-CM | POA: Diagnosis not present

## 2016-01-03 DIAGNOSIS — E1165 Type 2 diabetes mellitus with hyperglycemia: Secondary | ICD-10-CM | POA: Diagnosis not present

## 2016-01-03 DIAGNOSIS — K219 Gastro-esophageal reflux disease without esophagitis: Secondary | ICD-10-CM | POA: Diagnosis not present

## 2016-01-03 DIAGNOSIS — M1000 Idiopathic gout, unspecified site: Secondary | ICD-10-CM | POA: Diagnosis not present

## 2016-01-03 DIAGNOSIS — I10 Essential (primary) hypertension: Secondary | ICD-10-CM | POA: Diagnosis not present

## 2016-01-03 DIAGNOSIS — F419 Anxiety disorder, unspecified: Secondary | ICD-10-CM | POA: Diagnosis not present

## 2016-01-03 DIAGNOSIS — Z6831 Body mass index (BMI) 31.0-31.9, adult: Secondary | ICD-10-CM | POA: Diagnosis not present

## 2016-02-12 DIAGNOSIS — E11319 Type 2 diabetes mellitus with unspecified diabetic retinopathy without macular edema: Secondary | ICD-10-CM | POA: Diagnosis not present

## 2016-03-04 DIAGNOSIS — M129 Arthropathy, unspecified: Secondary | ICD-10-CM | POA: Diagnosis not present

## 2016-03-04 DIAGNOSIS — M5000 Cervical disc disorder with myelopathy, unspecified cervical region: Secondary | ICD-10-CM | POA: Diagnosis not present

## 2016-03-04 DIAGNOSIS — M1611 Unilateral primary osteoarthritis, right hip: Secondary | ICD-10-CM | POA: Diagnosis not present

## 2016-03-04 DIAGNOSIS — Z6832 Body mass index (BMI) 32.0-32.9, adult: Secondary | ICD-10-CM | POA: Diagnosis not present

## 2016-03-24 DIAGNOSIS — H40013 Open angle with borderline findings, low risk, bilateral: Secondary | ICD-10-CM | POA: Diagnosis not present

## 2016-03-24 DIAGNOSIS — H2513 Age-related nuclear cataract, bilateral: Secondary | ICD-10-CM | POA: Diagnosis not present

## 2016-03-24 DIAGNOSIS — H2512 Age-related nuclear cataract, left eye: Secondary | ICD-10-CM | POA: Diagnosis not present

## 2016-03-24 DIAGNOSIS — E113393 Type 2 diabetes mellitus with moderate nonproliferative diabetic retinopathy without macular edema, bilateral: Secondary | ICD-10-CM | POA: Diagnosis not present

## 2016-03-24 DIAGNOSIS — H5213 Myopia, bilateral: Secondary | ICD-10-CM | POA: Diagnosis not present

## 2016-03-25 NOTE — Patient Instructions (Signed)
Your procedure is scheduled on:  04/03/2016   Report to [facility] at  1150   AM.  Call this number if you have problems the morning of surgery: [phone]   Do not eat food or drink liquids :After Midnight.      Take these medicines the morning of surgery with A SIP OF WATER: allopurinol, xanax, prozac, hydrocodone, prilosec, hytrin. Take your inhalers before you come and bring your rescue inhaler with you. DO NOT take any medications for diabetes the morning of surgery.   Do not wear jewelry, make-up or nail polish.  Do not wear lotions, powders, or perfumes. You may wear deodorant.  Do not shave 48 hours prior to surgery.  Do not bring valuables to the hospital.  Contacts, dentures or bridgework may not be worn into surgery.  Leave suitcase in the car. After surgery it may be brought to your room.  For patients admitted to the hospital, checkout time is 11:00 AM the day of discharge.   Patients discharged the day of surgery will not be allowed to drive home.  :     Please read over the following fact sheets that you were given: Coughing and Deep Breathing, Surgical Site Infection Prevention, Anesthesia Post-op Instructions and Care and Recovery After Surgery    Cataract A cataract is a clouding of the lens of the eye. When a lens becomes cloudy, vision is reduced based on the degree and nature of the clouding. Many cataracts reduce vision to some degree. Some cataracts make people more near-sighted as they develop. Other cataracts increase glare. Cataracts that are ignored and become worse can sometimes look white. The white color can be seen through the pupil. CAUSES   Aging. However, cataracts may occur at any age, even in newborns.   Certain drugs.   Trauma to the eye.   Certain diseases such as diabetes.   Specific eye diseases such as chronic inflammation inside the eye or a sudden attack of a rare form of glaucoma.   Inherited or acquired medical problems.    SYMPTOMS   Gradual, progressive drop in vision in the affected eye.   Severe, rapid visual loss. This most often happens when trauma is the cause.  DIAGNOSIS  To detect a cataract, an eye doctor examines the lens. Cataracts are best diagnosed with an exam of the eyes with the pupils enlarged (dilated) by drops.  TREATMENT  For an early cataract, vision may improve by using different eyeglasses or stronger lighting. If that does not help your vision, surgery is the only effective treatment. A cataract needs to be surgically removed when vision loss interferes with your everyday activities, such as driving, reading, or watching TV. A cataract may also have to be removed if it prevents examination or treatment of another eye problem. Surgery removes the cloudy lens and usually replaces it with a substitute lens (intraocular lens, IOL).  At a time when both you and your doctor agree, the cataract will be surgically removed. If you have cataracts in both eyes, only one is usually removed at a time. This allows the operated eye to heal and be out of danger from any possible problems after surgery (such as infection or poor wound healing). In rare cases, a cataract may be doing damage to your eye. In these cases, your caregiver may advise surgical removal right away. The vast majority of people who have cataract surgery have better vision afterward. HOME CARE INSTRUCTIONS  If  you are not planning surgery, you may be asked to do the following:  Use different eyeglasses.   Use stronger or brighter lighting.   Ask your eye doctor about reducing your medicine dose or changing medicines if it is thought that a medicine caused your cataract. Changing medicines does not make the cataract go away on its own.   Become familiar with your surroundings. Poor vision can lead to injury. Avoid bumping into things on the affected side. You are at a higher risk for tripping or falling.   Exercise extreme care when  driving or operating machinery.   Wear sunglasses if you are sensitive to bright light or experiencing problems with glare.  SEEK IMMEDIATE MEDICAL CARE IF:   You have a worsening or sudden vision loss.   You notice redness, swelling, or increasing pain in the eye.   You have a fever.  Document Released: 03/24/2005 Document Revised: 03/13/2011 Document Reviewed: 11/15/2010 [facility] Patient Information 2012 [email or web address] INSTRUCTIONS POST-ANESTHESIA  IMMEDIATELY FOLLOWING SURGERY:  Do not drive or operate machinery for the first twenty four hours after surgery.  Do not make any important decisions for twenty four hours after surgery or while taking narcotic pain medications or sedatives.  If you develop intractable nausea and vomiting or a severe headache please notify your doctor immediately.  FOLLOW-UP:  Please make an appointment with your surgeon as instructed. You do not need to follow up with anesthesia unless specifically instructed to do so.  WOUND CARE INSTRUCTIONS (if applicable):  Keep a dry clean dressing on the anesthesia/puncture wound site if there is drainage.  Once the wound has quit draining you may leave it open to air.  Generally you should leave the bandage intact for twenty four hours unless there is drainage.  If the epidural site drains for more than 36-48 hours please call the anesthesia department.  QUESTIONS?:  Please feel free to call your physician or the hospital operator if you have any questions, and they will be happy to assist you.        Monitored Anesthesia Care Anesthesia is a term that refers to techniques, procedures, and medicines that help a person stay safe and comfortable during a medical procedure. Monitored anesthesia care, or sedation, is one type of anesthesia. Your anesthesia specialist may recommend sedation if you will be having a procedure that does not require you to be unconscious, such as:  Cataract surgery.  A dental  procedure.  A biopsy.  A colonoscopy. During the procedure, you may receive a medicine to help you relax (sedative). There are three levels of sedation:  Mild sedation. At this level, you may feel awake and relaxed. You will be able to follow directions.  Moderate sedation. At this level, you will be sleepy. You may not remember the procedure.  Deep sedation. At this level, you will be asleep. You will not remember the procedure. The more medicine you are given, the deeper your level of sedation will be. Depending on how you respond to the procedure, the anesthesia specialist may change your level of sedation or the type of anesthesia to fit your needs. An anesthesia specialist will monitor you closely during the procedure. Let your health care provider know about:  Any allergies you have.  All medicines you are taking, including vitamins, herbs, eye drops, creams, and over-the-counter medicines.  Any use of steroids (by mouth or as a cream).  Any problems you or family members have had with sedatives and anesthetic medicines.  Any blood disorders you have.  Any surgeries you have had.  Any medical conditions you have, such as sleep apnea.  Whether you are pregnant or may be pregnant.  Any use of cigarettes, alcohol, or street drugs. What are the risks? Generally, this is a safe procedure. However, problems may occur, including:  Getting too much medicine (oversedation).  Nausea.  Allergic reaction to medicines.  Trouble breathing. If this happens, a breathing tube may be used to help with breathing. It will be removed when you are awake and breathing on your own.  Heart trouble.  Lung trouble. Before the procedure Staying hydrated  Follow instructions from your health care provider about hydration, which may include:  Up to 2 hours before the procedure - you may continue to drink clear liquids, such as water, clear fruit juice, black coffee, and plain tea. Eating  and drinking restrictions  Follow instructions from your health care provider about eating and drinking, which may include:  8 hours before the procedure - stop eating heavy meals or foods such as meat, fried foods, or fatty foods.  6 hours before the procedure - stop eating light meals or foods, such as toast or cereal.  6 hours before the procedure - stop drinking milk or drinks that contain milk.  2 hours before the procedure - stop drinking clear liquids. Medicines  Ask your health care provider about:  Changing or stopping your regular medicines. This is especially important if you are taking diabetes medicines or blood thinners.  Taking medicines such as aspirin and ibuprofen. These medicines can thin your blood. Do not take these medicines before your procedure if your health care provider instructs you not to. Tests and exams  You will have a physical exam.  You may have blood tests done to show:  How well your kidneys and liver are working.  How well your blood can clot.  General instructions  Plan to have someone take you home from the hospital or clinic.  If you will be going home right after the procedure, plan to have someone with you for 24 hours. What happens during the procedure?  Your blood pressure, heart rate, breathing, level of pain and overall condition will be monitored.  An IV tube will be inserted into one of your veins.  Your anesthesia specialist will give you medicines as needed to keep you comfortable during the procedure. This may mean changing the level of sedation.  The procedure will be performed. After the procedure  Your blood pressure, heart rate, breathing rate, and blood oxygen level will be monitored until the medicines you were given have worn off.  Do not drive for 24 hours if you received a sedative.  You may:  Feel sleepy, clumsy, or nauseous.  Feel forgetful about what happened after the procedure.  Have a sore throat if  you had a breathing tube during the procedure.  Vomit. This information is not intended to replace advice given to you by your health care provider. Make sure you discuss any questions you have with your health care provider. Document Released: 12/18/2004 Document Revised: 08/31/2015 Document Reviewed: 07/15/2015 Elsevier Interactive Patient Education  2017 [employer].

## 2016-03-26 ENCOUNTER — Other Ambulatory Visit: Payer: Self-pay

## 2016-03-26 ENCOUNTER — Encounter (HOSPITAL_COMMUNITY): Payer: Self-pay

## 2016-03-26 ENCOUNTER — Encounter (HOSPITAL_COMMUNITY)
Admission: RE | Admit: 2016-03-26 | Discharge: 2016-03-26 | Disposition: A | Discharge status: Home / Self Care | Payer: Medicare Other | Source: Ambulatory Visit | Attending: Ophthalmology | Admitting: Ophthalmology

## 2016-03-26 DIAGNOSIS — Z01812 Encounter for preprocedural laboratory examination: Secondary | ICD-10-CM | POA: Diagnosis not present

## 2016-03-26 DIAGNOSIS — H2512 Age-related nuclear cataract, left eye: Secondary | ICD-10-CM | POA: Diagnosis not present

## 2016-03-26 HISTORY — DX: Gout, unspecified: M10.9

## 2016-03-26 HISTORY — DX: Chronic obstructive pulmonary disease, unspecified: J44.9

## 2016-03-26 HISTORY — DX: Anxiety disorder, unspecified: F41.9

## 2016-03-26 HISTORY — DX: Gastro-esophageal reflux disease without esophagitis: K21.9

## 2016-03-26 HISTORY — DX: Essential (primary) hypertension: I10

## 2016-03-26 HISTORY — DX: Type 2 diabetes mellitus without complications: E11.9

## 2016-03-26 LAB — BASIC METABOLIC PANEL
ANION GAP: 14 (ref 5–15)
BUN: 15 mg/dL (ref 6–20)
CHLORIDE: 96 mmol/L — AB (ref 101–111)
CO2: 25 mmol/L (ref 22–32)
Calcium: 9.2 mg/dL (ref 8.9–10.3)
Creatinine, Ser: 0.87 mg/dL (ref 0.61–1.24)
GFR calc non Af Amer: >60 mL/min (ref >60)
Glucose, Bld: 250 mg/dL — ABNORMAL HIGH (ref 65–99)
POTASSIUM: 4.1 mmol/L (ref 3.5–5.1)
SODIUM: 135 mmol/L (ref 135–145)

## 2016-03-26 LAB — CBC
HEMATOCRIT: 38.6 % — AB (ref 39.0–52.0)
HEMOGLOBIN: 13.3 g/dL (ref 13.0–17.0)
MCH: 29.7 pg (ref 26.0–34.0)
MCHC: 34.5 g/dL (ref 30.0–36.0)
MCV: 86.2 fL (ref 78.0–100.0)
Platelets: 117 10*3/uL — ABNORMAL LOW (ref 150–400)
RBC: 4.48 MIL/uL (ref 4.22–5.81)
RDW: 13.2 % (ref 11.5–15.5)
WBC: 4.3 10*3/uL (ref 4.0–10.5)

## 2016-04-03 ENCOUNTER — Ambulatory Visit (HOSPITAL_COMMUNITY)
Admission: RE | Admit: 2016-04-03 | Discharge: 2016-04-03 | Disposition: A | Discharge status: Home / Self Care | Payer: Medicare Other | Source: Ambulatory Visit | Attending: Ophthalmology | Admitting: Ophthalmology

## 2016-04-03 ENCOUNTER — Ambulatory Visit (HOSPITAL_COMMUNITY): Payer: Medicare Other | Admitting: Anesthesiology

## 2016-04-03 ENCOUNTER — Encounter (HOSPITAL_COMMUNITY): Payer: Self-pay

## 2016-04-03 ENCOUNTER — Encounter (HOSPITAL_COMMUNITY)
Admission: RE | Disposition: A | Discharge status: Home / Self Care | Payer: Self-pay | Source: Ambulatory Visit | Attending: Ophthalmology

## 2016-04-03 DIAGNOSIS — H2512 Age-related nuclear cataract, left eye: Secondary | ICD-10-CM | POA: Diagnosis not present

## 2016-04-03 DIAGNOSIS — J449 Chronic obstructive pulmonary disease, unspecified: Secondary | ICD-10-CM | POA: Diagnosis not present

## 2016-04-03 DIAGNOSIS — Z87891 Personal history of nicotine dependence: Secondary | ICD-10-CM | POA: Insufficient documentation

## 2016-04-03 DIAGNOSIS — F419 Anxiety disorder, unspecified: Secondary | ICD-10-CM | POA: Insufficient documentation

## 2016-04-03 DIAGNOSIS — E119 Type 2 diabetes mellitus without complications: Secondary | ICD-10-CM | POA: Diagnosis not present

## 2016-04-03 DIAGNOSIS — K219 Gastro-esophageal reflux disease without esophagitis: Secondary | ICD-10-CM | POA: Diagnosis not present

## 2016-04-03 DIAGNOSIS — Z79899 Other long term (current) drug therapy: Secondary | ICD-10-CM | POA: Diagnosis not present

## 2016-04-03 DIAGNOSIS — Z7984 Long term (current) use of oral hypoglycemic drugs: Secondary | ICD-10-CM | POA: Diagnosis not present

## 2016-04-03 DIAGNOSIS — I10 Essential (primary) hypertension: Secondary | ICD-10-CM | POA: Diagnosis not present

## 2016-04-03 HISTORY — PX: CATARACT EXTRACTION W/PHACO: SHX586

## 2016-04-03 LAB — GLUCOSE, CAPILLARY: Glucose-Capillary: 275 mg/dL — ABNORMAL HIGH (ref 65–99)

## 2016-04-03 SURGERY — PHACOEMULSIFICATION, CATARACT, WITH IOL INSERTION
Anesthesia: Monitor Anesthesia Care | Site: Eye | Laterality: Left

## 2016-04-03 MED ORDER — TETRACAINE HCL 0.5 % OP SOLN
1.0000 [drp] | OPHTHALMIC | Status: AC
  Administered 2016-04-03 (×3): 1 [drp] via OPHTHALMIC

## 2016-04-03 MED ORDER — CYCLOPENTOLATE-PHENYLEPHRINE 0.2-1 % OP SOLN
1.0000 [drp] | OPHTHALMIC | Status: AC
  Administered 2016-04-03 (×3): 1 [drp] via OPHTHALMIC

## 2016-04-03 MED ORDER — EPINEPHRINE PF 1 MG/ML IJ SOLN
INTRAMUSCULAR | Status: AC
  Filled 2016-04-03: qty 1

## 2016-04-03 MED ORDER — PROVISC 10 MG/ML IO SOLN
INTRAOCULAR | Status: DC | PRN
  Administered 2016-04-03: 0.85 mL via INTRAOCULAR

## 2016-04-03 MED ORDER — FENTANYL CITRATE (PF) 100 MCG/2ML IJ SOLN
INTRAMUSCULAR | Status: AC
  Filled 2016-04-03: qty 2

## 2016-04-03 MED ORDER — POVIDONE-IODINE 5 % OP SOLN
OPHTHALMIC | Status: DC | PRN
  Administered 2016-04-03: 1 via OPHTHALMIC

## 2016-04-03 MED ORDER — BSS IO SOLN
INTRAOCULAR | Status: DC | PRN
  Administered 2016-04-03: 15 mL via INTRAOCULAR

## 2016-04-03 MED ORDER — FENTANYL CITRATE (PF) 100 MCG/2ML IJ SOLN
25.0000 ug | INTRAMUSCULAR | Status: AC | PRN
  Administered 2016-04-03 (×2): 25 ug via INTRAVENOUS

## 2016-04-03 MED ORDER — NEOMYCIN-POLYMYXIN-DEXAMETH 3.5-10000-0.1 OP SUSP
OPHTHALMIC | Status: DC | PRN
  Administered 2016-04-03: 1 [drp] via OPHTHALMIC

## 2016-04-03 MED ORDER — LIDOCAINE HCL 3.5 % OP GEL
1.0000 "application " | Freq: Once | OPHTHALMIC | Status: AC
  Administered 2016-04-03: 1 via OPHTHALMIC

## 2016-04-03 MED ORDER — MIDAZOLAM HCL 2 MG/2ML IJ SOLN
INTRAMUSCULAR | Status: AC
  Filled 2016-04-03: qty 2

## 2016-04-03 MED ORDER — EPINEPHRINE PF 1 MG/ML IJ SOLN
INTRAOCULAR | Status: DC | PRN
  Administered 2016-04-03: 500 mL

## 2016-04-03 MED ORDER — PHENYLEPHRINE HCL 2.5 % OP SOLN
1.0000 [drp] | OPHTHALMIC | Status: AC
  Administered 2016-04-03 (×3): 1 [drp] via OPHTHALMIC

## 2016-04-03 MED ORDER — LIDOCAINE HCL (PF) 1 % IJ SOLN
INTRAOCULAR | Status: DC | PRN
  Administered 2016-04-03: .9 mL via OPHTHALMIC

## 2016-04-03 MED ORDER — MIDAZOLAM HCL 2 MG/2ML IJ SOLN
1.0000 mg | INTRAMUSCULAR | Status: DC | PRN
  Administered 2016-04-03 (×2): 2 mg via INTRAVENOUS
  Filled 2016-04-03: qty 2

## 2016-04-03 MED ORDER — LACTATED RINGERS IV SOLN
INTRAVENOUS | Status: DC
  Administered 2016-04-03: 12:00:00 via INTRAVENOUS

## 2016-04-03 SURGICAL SUPPLY — 23 items
CAPSULAR TENSION RING-AMO (OPHTHALMIC RELATED) IMPLANT
CLOTH BEACON ORANGE TIMEOUT ST (SAFETY) ×3 IMPLANT
EYE SHIELD UNIVERSAL CLEAR (GAUZE/BANDAGES/DRESSINGS) ×3 IMPLANT
GLOVE BIOGEL PI IND STRL 6.5 (GLOVE) ×1 IMPLANT
GLOVE BIOGEL PI IND STRL 7.0 (GLOVE) ×2 IMPLANT
GLOVE BIOGEL PI INDICATOR 6.5 (GLOVE) ×2
GLOVE BIOGEL PI INDICATOR 7.0 (GLOVE) ×4
GLOVE EXAM NITRILE LRG STRL (GLOVE) IMPLANT
GLOVE EXAM NITRILE MD LF STRL (GLOVE) IMPLANT
KIT VITRECTOMY (OPHTHALMIC RELATED) IMPLANT
PAD ARMBOARD 7.5X6 YLW CONV (MISCELLANEOUS) ×3 IMPLANT
PROC W NO LENS (INTRAOCULAR LENS)
PROC W SPEC LENS (INTRAOCULAR LENS)
PROCESS W NO LENS (INTRAOCULAR LENS) IMPLANT
PROCESS W SPEC LENS (INTRAOCULAR LENS) IMPLANT
RETRACTOR IRIS SIGHTPATH (OPHTHALMIC RELATED) IMPLANT
RING MALYGIN (MISCELLANEOUS) IMPLANT
SIGHTPATH CAT PROC W REG LENS (Ophthalmic Related) ×3 IMPLANT
SYRINGE LUER LOK 1CC (MISCELLANEOUS) ×3 IMPLANT
TAPE SURG TRANSPORE 1 IN (GAUZE/BANDAGES/DRESSINGS) ×1 IMPLANT
TAPE SURGICAL TRANSPORE 1 IN (GAUZE/BANDAGES/DRESSINGS) ×2
VISCOELASTIC ADDITIONAL (OPHTHALMIC RELATED) IMPLANT
WATER STERILE IRR 250ML POUR (IV SOLUTION) ×3 IMPLANT

## 2016-04-03 NOTE — Anesthesia Postprocedure Evaluation (Signed)
Anesthesia Post Note  Patient: Kurt Duran  Procedure(s) Performed: Procedure(s) (LRB): CATARACT EXTRACTION PHACO AND INTRAOCULAR LENS PLACEMENT LEFT EYE CDE= 11.33 (Left)  Patient location during evaluation: Short Stay Anesthesia Type: MAC Level of consciousness: awake, oriented and patient cooperative Pain management: pain level controlled Vital Signs Assessment: post-procedure vital signs reviewed and stable Respiratory status: spontaneous breathing, nonlabored ventilation and respiratory function stable Cardiovascular status: blood pressure returned to baseline Postop Assessment: no signs of nausea or vomiting Anesthetic complications: no     Last Vitals:  Vitals:   04/03/16 1205 04/03/16 1210  BP: 130/76 113/67  Pulse:    Resp: 14 14  Temp:      Last Pain:  Vitals:   04/03/16 1132  TempSrc: Oral  PainSc: 0-No pain                 Aubrianna Orchard J

## 2016-04-03 NOTE — Transfer of Care (Signed)
Immediate Anesthesia Transfer of Care Note  Patient: Kurt Duran  Procedure(s) Performed: Procedure(s) with comments: CATARACT EXTRACTION PHACO AND INTRAOCULAR LENS PLACEMENT LEFT EYE CDE= 11.33 (Left) - left  Patient Location: Short Stay  Anesthesia Type:MAC  Level of Consciousness: awake, alert , oriented and patient cooperative  Airway & Oxygen Therapy: Patient Spontanous Breathing  Post-op Assessment: Report given to RN, Post -op Vital signs reviewed and stable and Patient moving all extremities  Post vital signs: Reviewed and stable  Last Vitals:  Vitals:   04/03/16 1205 04/03/16 1210  BP: 130/76 113/67  Pulse:    Resp: 14 14  Temp:      Last Pain:  Vitals:   04/03/16 1132  TempSrc: Oral  PainSc: 0-No pain      Patients Stated Pain Goal: 0 ([id number])  Complications: No apparent anesthesia complications

## 2016-04-03 NOTE — Op Note (Signed)
Date of Admission: 04/03/2016  Date of Surgery: 04/03/2016  Pre-Op Dx: Cataract Left  Eye  Post-Op Dx: Senile Nuclear Cataract  Left  Eye,  Dx Code H25.12  Surgeon: Tonny Branch, M.D.  Assistants: None  Anesthesia: Topical with MAC  Indications: Painless, progressive loss of vision with compromise of daily activities.  Surgery: Cataract Extraction with Intraocular lens Implant Left Eye  Discription: The patient had dilating drops and viscous lidocaine placed into the Left eye in the pre-op holding area. After transfer to the operating room, a time out was performed. The patient was then prepped and draped. Beginning with a 56 degree blade a paracentesis port was made at the surgeon's 2 o'clock position. The anterior chamber was then filled with 1% non-preserved lidocaine. This was followed by filling the anterior chamber with Provisc with epinepherine.  A 2.11mm keratome blade was used to make a clear corneal incision at the temporal limbus.  A bent cystatome needle was used to create a continuous tear capsulotomy. Hydrodissection was performed with balanced salt solution on a Fine canula. The lens nucleus was then removed using the phacoemulsification handpiece. Residual cortex was removed with the I&A handpiece. The anterior chamber and capsular bag were refilled with Provisc. A posterior chamber intraocular lens was placed into the capsular bag with it's injector. The implant was positioned with the Kuglan hook. The Provisc was then removed from the anterior chamber and capsular bag with the I&A handpiece. Stromal hydration of the main incision and paracentesis port was performed with BSS on a Fine canula. The wounds were tested for leak which was negative. The patient tolerated the procedure well. There were no operative complications. The patient was then transferred to the recovery room in stable condition.  Complications: None  Specimen: None  EBL: None  Prosthetic device: Abbott  Technis, PCB00, power 18.0, SN [id number].

## 2016-04-03 NOTE — H&P (Signed)
I have reviewed the H&P, the patient was re-examined, and I have identified no interval changes in medical condition and plan of care since the history and physical of record  

## 2016-04-03 NOTE — Discharge Instructions (Signed)

## 2016-04-03 NOTE — Anesthesia Preprocedure Evaluation (Signed)
Anesthesia Evaluation  Patient identified by MRN, date of birth, ID band Patient awake    Reviewed: Allergy & Precautions, NPO status , Patient's Chart, lab work & pertinent test results  Airway Mallampati: I  TM Distance: >3 FB     Dental  (+) Teeth Intact, Implants   Pulmonary COPD,  COPD inhaler, former smoker,    breath sounds clear to auscultation       Cardiovascular hypertension, Pt. on medications  Rhythm:Regular Rate:Normal     Neuro/Psych PSYCHIATRIC DISORDERS Anxiety    GI/Hepatic GERD  Controlled and Medicated,  Endo/Other  diabetes, Type 2, Oral Hypoglycemic Agents  Renal/GU      Musculoskeletal   Abdominal   Peds  Hematology   Anesthesia Other Findings   Reproductive/Obstetrics                             Anesthesia Physical Anesthesia Plan  ASA: III  Anesthesia Plan: MAC   Post-op Pain Management:    Induction: Intravenous  Airway Management Planned: Nasal Cannula  Additional Equipment:   Intra-op Plan:   Post-operative Plan:   Informed Consent: I have reviewed the patients History and Physical, chart, labs and discussed the procedure including the risks, benefits and alternatives for the proposed anesthesia with the patient or authorized representative who has indicated his/her understanding and acceptance.     Plan Discussed with:   Anesthesia Plan Comments:         Anesthesia Quick Evaluation  

## 2016-04-04 ENCOUNTER — Encounter (HOSPITAL_COMMUNITY): Payer: Self-pay | Admitting: Ophthalmology

## 2016-04-14 DIAGNOSIS — H2511 Age-related nuclear cataract, right eye: Secondary | ICD-10-CM | POA: Diagnosis not present

## 2016-04-15 DIAGNOSIS — M5020 Other cervical disc displacement, unspecified cervical region: Secondary | ICD-10-CM | POA: Diagnosis not present

## 2016-04-15 DIAGNOSIS — M4802 Spinal stenosis, cervical region: Secondary | ICD-10-CM | POA: Diagnosis not present

## 2016-04-15 DIAGNOSIS — M4722 Other spondylosis with radiculopathy, cervical region: Secondary | ICD-10-CM | POA: Diagnosis not present

## 2016-04-23 ENCOUNTER — Encounter (HOSPITAL_COMMUNITY)
Admission: RE | Admit: 2016-04-23 | Discharge: 2016-04-23 | Disposition: A | Discharge status: Home / Self Care | Payer: Medicare Other | Source: Ambulatory Visit | Attending: Ophthalmology | Admitting: Ophthalmology

## 2016-04-23 ENCOUNTER — Encounter (HOSPITAL_COMMUNITY): Payer: Self-pay

## 2016-04-28 ENCOUNTER — Encounter (HOSPITAL_COMMUNITY): Payer: Self-pay | Admitting: Anesthesiology

## 2016-04-28 ENCOUNTER — Encounter (HOSPITAL_COMMUNITY)
Admission: RE | Disposition: A | Discharge status: Home / Self Care | Payer: Self-pay | Source: Ambulatory Visit | Attending: Ophthalmology

## 2016-04-28 ENCOUNTER — Ambulatory Visit (HOSPITAL_COMMUNITY): Payer: Medicare Other | Admitting: Anesthesiology

## 2016-04-28 ENCOUNTER — Ambulatory Visit (HOSPITAL_COMMUNITY)
Admission: RE | Admit: 2016-04-28 | Discharge: 2016-04-28 | Disposition: A | Discharge status: Home / Self Care | Payer: Medicare Other | Source: Ambulatory Visit | Attending: Ophthalmology | Admitting: Ophthalmology

## 2016-04-28 DIAGNOSIS — J449 Chronic obstructive pulmonary disease, unspecified: Secondary | ICD-10-CM | POA: Insufficient documentation

## 2016-04-28 DIAGNOSIS — K219 Gastro-esophageal reflux disease without esophagitis: Secondary | ICD-10-CM | POA: Insufficient documentation

## 2016-04-28 DIAGNOSIS — Z79899 Other long term (current) drug therapy: Secondary | ICD-10-CM | POA: Diagnosis not present

## 2016-04-28 DIAGNOSIS — Z87891 Personal history of nicotine dependence: Secondary | ICD-10-CM | POA: Insufficient documentation

## 2016-04-28 DIAGNOSIS — I10 Essential (primary) hypertension: Secondary | ICD-10-CM | POA: Insufficient documentation

## 2016-04-28 DIAGNOSIS — E119 Type 2 diabetes mellitus without complications: Secondary | ICD-10-CM | POA: Diagnosis not present

## 2016-04-28 DIAGNOSIS — H2511 Age-related nuclear cataract, right eye: Secondary | ICD-10-CM | POA: Diagnosis not present

## 2016-04-28 DIAGNOSIS — Z7984 Long term (current) use of oral hypoglycemic drugs: Secondary | ICD-10-CM | POA: Diagnosis not present

## 2016-04-28 DIAGNOSIS — F419 Anxiety disorder, unspecified: Secondary | ICD-10-CM | POA: Diagnosis not present

## 2016-04-28 HISTORY — PX: CATARACT EXTRACTION W/PHACO: SHX586

## 2016-04-28 LAB — GLUCOSE, CAPILLARY: Glucose-Capillary: 310 mg/dL — ABNORMAL HIGH (ref 65–99)

## 2016-04-28 SURGERY — PHACOEMULSIFICATION, CATARACT, WITH IOL INSERTION
Anesthesia: Monitor Anesthesia Care | Site: Eye | Laterality: Right

## 2016-04-28 MED ORDER — LIDOCAINE 3.5 % OP GEL OPTIME - NO CHARGE
OPHTHALMIC | Status: DC | PRN
  Administered 2016-04-28: 1 [drp] via OPHTHALMIC

## 2016-04-28 MED ORDER — MIDAZOLAM HCL 2 MG/2ML IJ SOLN
0.5000 mg | INTRAMUSCULAR | Status: DC | PRN
  Administered 2016-04-28 (×2): 1 mg via INTRAVENOUS
  Filled 2016-04-28: qty 2

## 2016-04-28 MED ORDER — FENTANYL CITRATE (PF) 100 MCG/2ML IJ SOLN
25.0000 ug | INTRAMUSCULAR | Status: DC | PRN
  Administered 2016-04-28: 25 ug via INTRAVENOUS
  Filled 2016-04-28: qty 2

## 2016-04-28 MED ORDER — POVIDONE-IODINE 5 % OP SOLN
OPHTHALMIC | Status: DC | PRN
  Administered 2016-04-28: 1 via OPHTHALMIC

## 2016-04-28 MED ORDER — BSS IO SOLN
INTRAOCULAR | Status: DC | PRN
  Administered 2016-04-28: 15 mL via INTRAOCULAR

## 2016-04-28 MED ORDER — EPINEPHRINE PF 1 MG/ML IJ SOLN
INTRAOCULAR | Status: DC | PRN
  Administered 2016-04-28: 500 mL

## 2016-04-28 MED ORDER — NEOMYCIN-POLYMYXIN-DEXAMETH 3.5-10000-0.1 OP SUSP
OPHTHALMIC | Status: DC | PRN
  Administered 2016-04-28: 2 [drp] via OPHTHALMIC

## 2016-04-28 MED ORDER — LIDOCAINE HCL (PF) 1 % IJ SOLN
INTRAMUSCULAR | Status: DC | PRN
  Administered 2016-04-28: .8 mL via OPHTHALMIC

## 2016-04-28 MED ORDER — LIDOCAINE HCL 3.5 % OP GEL
1.0000 | Freq: Once | OPHTHALMIC | Status: AC
Start: 2016-04-28 — End: 2016-04-28
  Administered 2016-04-28: 1 via OPHTHALMIC

## 2016-04-28 MED ORDER — LACTATED RINGERS IV SOLN
INTRAVENOUS | Status: DC
  Administered 2016-04-28: 08:00:00 via INTRAVENOUS

## 2016-04-28 MED ORDER — EPINEPHRINE PF 1 MG/ML IJ SOLN
INTRAMUSCULAR | Status: AC
  Filled 2016-04-28: qty 2

## 2016-04-28 MED ORDER — PROVISC 10 MG/ML IO SOLN
INTRAOCULAR | Status: DC | PRN
  Administered 2016-04-28: 0.85 mL via INTRAOCULAR

## 2016-04-28 MED ORDER — CYCLOPENTOLATE-PHENYLEPHRINE 0.2-1 % OP SOLN
1.0000 [drp] | OPHTHALMIC | Status: AC
  Administered 2016-04-28 (×3): 1 [drp] via OPHTHALMIC

## 2016-04-28 MED ORDER — PHENYLEPHRINE HCL 2.5 % OP SOLN
1.0000 [drp] | OPHTHALMIC | Status: AC
  Administered 2016-04-28 (×3): 1 [drp] via OPHTHALMIC

## 2016-04-28 MED ORDER — TETRACAINE HCL 0.5 % OP SOLN
1.0000 [drp] | OPHTHALMIC | Status: AC
  Administered 2016-04-28 (×3): 1 [drp] via OPHTHALMIC

## 2016-04-28 SURGICAL SUPPLY — 23 items
CAPSULAR TENSION RING-AMO (OPHTHALMIC RELATED) IMPLANT
CLOTH BEACON ORANGE TIMEOUT ST (SAFETY) ×3 IMPLANT
EYE SHIELD UNIVERSAL CLEAR (GAUZE/BANDAGES/DRESSINGS) ×3 IMPLANT
GLOVE BIOGEL PI IND STRL 6.5 (GLOVE) IMPLANT
GLOVE BIOGEL PI IND STRL 7.0 (GLOVE) ×1 IMPLANT
GLOVE BIOGEL PI INDICATOR 6.5 (GLOVE)
GLOVE BIOGEL PI INDICATOR 7.0 (GLOVE) ×2
GLOVE EXAM NITRILE LRG STRL (GLOVE) ×3 IMPLANT
GLOVE EXAM NITRILE MD LF STRL (GLOVE) ×3 IMPLANT
KIT VITRECTOMY (OPHTHALMIC RELATED) ×3 IMPLANT
LENS ALC ACRYL/TECN (Ophthalmic Related) ×3 IMPLANT
PAD ARMBOARD 7.5X6 YLW CONV (MISCELLANEOUS) ×3 IMPLANT
PROC W NO LENS (INTRAOCULAR LENS)
PROC W SPEC LENS (INTRAOCULAR LENS)
PROCESS W NO LENS (INTRAOCULAR LENS) IMPLANT
PROCESS W SPEC LENS (INTRAOCULAR LENS) IMPLANT
RETRACTOR IRIS SIGHTPATH (OPHTHALMIC RELATED) IMPLANT
RING MALYGIN (MISCELLANEOUS) IMPLANT
SYRINGE LUER LOK 1CC (MISCELLANEOUS) ×3 IMPLANT
TAPE SURG TRANSPORE 1 IN (GAUZE/BANDAGES/DRESSINGS) ×1 IMPLANT
TAPE SURGICAL TRANSPORE 1 IN (GAUZE/BANDAGES/DRESSINGS) ×2
VISCOELASTIC ADDITIONAL (OPHTHALMIC RELATED) IMPLANT
WATER STERILE IRR 250ML POUR (IV SOLUTION) ×3 IMPLANT

## 2016-04-28 NOTE — Discharge Instructions (Signed)
Moderate Conscious Sedation, Adult, Care After °These instructions provide you with information about caring for yourself after your procedure. Your health care provider may also give you more specific instructions. Your treatment has been planned according to current medical practices, but problems sometimes occur. Call your health care provider if you have any problems or questions after your procedure. °What can I expect after the procedure? °After your procedure, it is common: °· To feel sleepy for several hours. °· To feel clumsy and have poor balance for several hours. °· To have poor judgment for several hours. °· To vomit if you eat too soon. °Follow these instructions at home: °For at least 24 hours after the procedure:  ° °· Do not: °¨ Participate in activities where you could fall or become injured. °¨ Drive. °¨ Use heavy machinery. °¨ Drink alcohol. °¨ Take sleeping pills or medicines that cause drowsiness. °¨ Make important decisions or sign legal documents. °¨ Take care of children on your own. °· Rest. °Eating and drinking  °· Follow the diet recommended by your health care provider. °· If you vomit: °¨ Drink water, juice, or soup when you can drink without vomiting. °¨ Make sure you have little or no nausea before eating solid foods. °General instructions  °· Have a responsible adult stay with you until you are awake and alert. °· Take over-the-counter and prescription medicines only as told by your health care provider. °· If you smoke, do not smoke without supervision. °· Keep all follow-up visits as told by your health care provider. This is important. °Contact a health care provider if: °· You keep feeling nauseous or you keep vomiting. °· You feel light-headed. °· You develop a rash. °· You have a fever. °Get help right away if: °· You have trouble breathing. °This information is not intended to replace advice given to you by your health care provider. Make sure you discuss any questions you  have with your health care provider. °Document Released: 01/12/2013 Document Revised: 08/27/2015 Document Reviewed: 07/14/2015 °Elsevier Interactive Patient Education © 2017 Elsevier Inc. ° °

## 2016-04-28 NOTE — H&P (Signed)
I have reviewed the H&P, the patient was re-examined, and I have identified no interval changes in medical condition and plan of care since the history and physical of record  

## 2016-04-28 NOTE — Op Note (Signed)
Date of Admission: 04/28/2016  Date of Surgery: 04/28/2016  Pre-Op Dx: Cataract Right  Eye  Post-Op Dx: Senile Nuclear Cataract  Right  Eye,  Dx Code H25.11  Surgeon: Tonny Branch, M.D.  Assistants: None  Anesthesia: Topical with MAC  Indications: Painless, progressive loss of vision with compromise of daily activities.  Surgery: Cataract Extraction with Intraocular lens Implant Right Eye  Discription: The patient had dilating drops and viscous lidocaine placed into the Right eye in the pre-op holding area. After transfer to the operating room, a time out was performed. The patient was then prepped and draped. Beginning with a 79 degree blade a paracentesis port was made at the surgeon's 2 o'clock position. The anterior chamber was then filled with 1% non-preserved lidocaine with epinepherine. This was followed by filling the anterior chamber with Provisc.  A 2.62m keratome blade was used to make a clear corneal incision at the temporal limbus.  A bent cystatome needle was used to create a continuous tear capsulotomy. Hydrodissection was performed with balanced salt solution on a Fine canula. The lens nucleus was then removed using the phacoemulsification handpiece. Residual cortex was removed with the I&A handpiece. The anterior chamber and capsular bag were refilled with Provisc. A posterior chamber intraocular lens was placed into the capsular bag with it's injector. The implant was positioned with the Kuglan hook. The Provisc was then removed from the anterior chamber and capsular bag with the I&A handpiece. Stromal hydration of the main incision and paracentesis port was performed with BSS on a Fine canula. The wounds were tested for leak which was negative. The patient tolerated the procedure well. There were no operative complications. The patient was then transferred to the recovery room in stable condition.  Complications: None  Specimen: None  EBL: None  Prosthetic device: Abbott  Technis, PCB00, power 16.0, SN [id number]

## 2016-04-28 NOTE — Anesthesia Preprocedure Evaluation (Signed)
Anesthesia Evaluation  Patient identified by MRN, date of birth, ID band Patient awake    Reviewed: Allergy & Precautions, NPO status , Patient's Chart, lab work & pertinent test results  Airway Mallampati: I  TM Distance: >3 FB     Dental  (+) Teeth Intact, Implants   Pulmonary COPD,  COPD inhaler, former smoker,    breath sounds clear to auscultation       Cardiovascular hypertension, Pt. on medications  Rhythm:Regular Rate:Normal     Neuro/Psych PSYCHIATRIC DISORDERS Anxiety    GI/Hepatic GERD  Controlled and Medicated,  Endo/Other  diabetes, Type 2, Oral Hypoglycemic Agents  Renal/GU      Musculoskeletal   Abdominal   Peds  Hematology   Anesthesia Other Findings   Reproductive/Obstetrics                             Anesthesia Physical Anesthesia Plan  ASA: III  Anesthesia Plan: MAC   Post-op Pain Management:    Induction: Intravenous  Airway Management Planned: Nasal Cannula  Additional Equipment:   Intra-op Plan:   Post-operative Plan:   Informed Consent: I have reviewed the patients History and Physical, chart, labs and discussed the procedure including the risks, benefits and alternatives for the proposed anesthesia with the patient or authorized representative who has indicated his/her understanding and acceptance.     Plan Discussed with:   Anesthesia Plan Comments:         Anesthesia Quick Evaluation

## 2016-04-28 NOTE — Transfer of Care (Signed)
Immediate Anesthesia Transfer of Care Note  Patient: Kurt Duran  Procedure(s) Performed: Procedure(s) with comments: CATARACT EXTRACTION PHACO AND INTRAOCULAR LENS PLACEMENT (IOC) (Right) - cde-10.23   Patient Location: PACU  Anesthesia Type:MAC  Level of Consciousness: awake, alert , oriented and patient cooperative  Airway & Oxygen Therapy: Patient Spontanous Breathing  Post-op Assessment: Report given to RN and Post -op Vital signs reviewed and stable  Post vital signs: Reviewed and stable  Last Vitals:  Vitals:   04/28/16 0750 04/28/16 0755  BP: 127/69 122/68  Pulse:    Resp:  18  Temp:      Last Pain:  Vitals:   04/28/16 0714  TempSrc: Oral      Patients Stated Pain Goal: 6 (19/16/60 6004)  Complications: No apparent anesthesia complications

## 2016-04-28 NOTE — Anesthesia Postprocedure Evaluation (Signed)
Anesthesia Post Note  Patient: Kurt Duran  Procedure(s) Performed: Procedure(s) (LRB): CATARACT EXTRACTION PHACO AND INTRAOCULAR LENS PLACEMENT (IOC) (Right)  Patient location during evaluation: PACU Anesthesia Type: MAC Level of consciousness: awake, awake and alert, oriented and patient cooperative Pain management: pain level controlled Vital Signs Assessment: post-procedure vital signs reviewed and stable Respiratory status: spontaneous breathing Cardiovascular status: blood pressure returned to baseline and stable Postop Assessment: no headache, no backache and adequate PO intake Anesthetic complications: no     Last Vitals:  Vitals:   04/28/16 0750 04/28/16 0755  BP: 127/69 122/68  Pulse:    Resp:  18  Temp:      Last Pain:  Vitals:   04/28/16 0714  TempSrc: Oral                 [name]

## 2016-04-29 ENCOUNTER — Encounter (HOSPITAL_COMMUNITY): Payer: Self-pay | Admitting: Ophthalmology

## 2016-04-29 NOTE — Addendum Note (Signed)
Addendum  created 04/29/16 0737 by Vista Deck, CRNA   Anesthesia Intra Flowsheets edited

## 2016-05-07 DIAGNOSIS — M5000 Cervical disc disorder with myelopathy, unspecified cervical region: Secondary | ICD-10-CM | POA: Diagnosis not present

## 2016-05-07 DIAGNOSIS — M1000 Idiopathic gout, unspecified site: Secondary | ICD-10-CM | POA: Diagnosis not present

## 2016-05-07 DIAGNOSIS — E1140 Type 2 diabetes mellitus with diabetic neuropathy, unspecified: Secondary | ICD-10-CM | POA: Diagnosis not present

## 2016-05-07 DIAGNOSIS — Z6832 Body mass index (BMI) 32.0-32.9, adult: Secondary | ICD-10-CM | POA: Diagnosis not present

## 2016-05-07 DIAGNOSIS — G629 Polyneuropathy, unspecified: Secondary | ICD-10-CM | POA: Diagnosis not present

## 2016-06-27 DIAGNOSIS — E1165 Type 2 diabetes mellitus with hyperglycemia: Secondary | ICD-10-CM | POA: Diagnosis not present

## 2016-06-27 DIAGNOSIS — E1140 Type 2 diabetes mellitus with diabetic neuropathy, unspecified: Secondary | ICD-10-CM | POA: Diagnosis not present

## 2016-06-27 DIAGNOSIS — I10 Essential (primary) hypertension: Secondary | ICD-10-CM | POA: Diagnosis not present

## 2016-06-27 DIAGNOSIS — E11610 Type 2 diabetes mellitus with diabetic neuropathic arthropathy: Secondary | ICD-10-CM | POA: Diagnosis not present

## 2016-06-27 DIAGNOSIS — F419 Anxiety disorder, unspecified: Secondary | ICD-10-CM | POA: Diagnosis not present

## 2016-06-27 DIAGNOSIS — E7800 Pure hypercholesterolemia, unspecified: Secondary | ICD-10-CM | POA: Diagnosis not present

## 2016-07-02 DIAGNOSIS — H905 Unspecified sensorineural hearing loss: Secondary | ICD-10-CM | POA: Diagnosis not present

## 2016-07-02 DIAGNOSIS — F419 Anxiety disorder, unspecified: Secondary | ICD-10-CM | POA: Diagnosis not present

## 2016-07-02 DIAGNOSIS — Z6832 Body mass index (BMI) 32.0-32.9, adult: Secondary | ICD-10-CM | POA: Diagnosis not present

## 2016-07-02 DIAGNOSIS — E1165 Type 2 diabetes mellitus with hyperglycemia: Secondary | ICD-10-CM | POA: Diagnosis not present

## 2016-07-02 DIAGNOSIS — G629 Polyneuropathy, unspecified: Secondary | ICD-10-CM | POA: Diagnosis not present

## 2016-07-09 DIAGNOSIS — Z6832 Body mass index (BMI) 32.0-32.9, adult: Secondary | ICD-10-CM | POA: Diagnosis not present

## 2016-07-09 DIAGNOSIS — M1611 Unilateral primary osteoarthritis, right hip: Secondary | ICD-10-CM | POA: Diagnosis not present

## 2016-07-09 DIAGNOSIS — M1711 Unilateral primary osteoarthritis, right knee: Secondary | ICD-10-CM | POA: Diagnosis not present

## 2016-07-16 DIAGNOSIS — M1711 Unilateral primary osteoarthritis, right knee: Secondary | ICD-10-CM | POA: Diagnosis not present

## 2016-07-17 DIAGNOSIS — M4722 Other spondylosis with radiculopathy, cervical region: Secondary | ICD-10-CM | POA: Diagnosis not present

## 2016-07-17 DIAGNOSIS — M5020 Other cervical disc displacement, unspecified cervical region: Secondary | ICD-10-CM | POA: Diagnosis not present

## 2016-07-23 DIAGNOSIS — M1711 Unilateral primary osteoarthritis, right knee: Secondary | ICD-10-CM | POA: Diagnosis not present

## 2016-07-25 DIAGNOSIS — J302 Other seasonal allergic rhinitis: Secondary | ICD-10-CM | POA: Diagnosis not present

## 2016-07-25 DIAGNOSIS — R1310 Dysphagia, unspecified: Secondary | ICD-10-CM | POA: Diagnosis not present

## 2016-07-25 DIAGNOSIS — Z8739 Personal history of other diseases of the musculoskeletal system and connective tissue: Secondary | ICD-10-CM | POA: Diagnosis not present

## 2016-07-25 DIAGNOSIS — Z87891 Personal history of nicotine dependence: Secondary | ICD-10-CM | POA: Diagnosis not present

## 2016-07-25 DIAGNOSIS — H9193 Unspecified hearing loss, bilateral: Secondary | ICD-10-CM | POA: Diagnosis not present

## 2016-07-25 DIAGNOSIS — R490 Dysphonia: Secondary | ICD-10-CM | POA: Diagnosis not present

## 2016-07-25 DIAGNOSIS — Z974 Presence of external hearing-aid: Secondary | ICD-10-CM | POA: Diagnosis not present

## 2016-09-19 DIAGNOSIS — M5020 Other cervical disc displacement, unspecified cervical region: Secondary | ICD-10-CM | POA: Diagnosis not present

## 2016-09-19 DIAGNOSIS — M4722 Other spondylosis with radiculopathy, cervical region: Secondary | ICD-10-CM | POA: Diagnosis not present

## 2016-09-19 DIAGNOSIS — M4802 Spinal stenosis, cervical region: Secondary | ICD-10-CM | POA: Diagnosis not present

## 2016-09-26 DIAGNOSIS — E1140 Type 2 diabetes mellitus with diabetic neuropathy, unspecified: Secondary | ICD-10-CM | POA: Diagnosis not present

## 2016-09-26 DIAGNOSIS — E1165 Type 2 diabetes mellitus with hyperglycemia: Secondary | ICD-10-CM | POA: Diagnosis not present

## 2016-09-26 DIAGNOSIS — E7800 Pure hypercholesterolemia, unspecified: Secondary | ICD-10-CM | POA: Diagnosis not present

## 2016-09-26 DIAGNOSIS — I10 Essential (primary) hypertension: Secondary | ICD-10-CM | POA: Diagnosis not present

## 2016-09-26 DIAGNOSIS — K219 Gastro-esophageal reflux disease without esophagitis: Secondary | ICD-10-CM | POA: Diagnosis not present

## 2016-09-30 DIAGNOSIS — E1165 Type 2 diabetes mellitus with hyperglycemia: Secondary | ICD-10-CM | POA: Diagnosis not present

## 2016-09-30 DIAGNOSIS — I10 Essential (primary) hypertension: Secondary | ICD-10-CM | POA: Diagnosis not present

## 2016-09-30 DIAGNOSIS — Z6831 Body mass index (BMI) 31.0-31.9, adult: Secondary | ICD-10-CM | POA: Diagnosis not present

## 2016-10-28 DIAGNOSIS — M25551 Pain in right hip: Secondary | ICD-10-CM | POA: Diagnosis not present

## 2016-10-28 DIAGNOSIS — M25512 Pain in left shoulder: Secondary | ICD-10-CM | POA: Diagnosis not present

## 2016-10-28 DIAGNOSIS — M4722 Other spondylosis with radiculopathy, cervical region: Secondary | ICD-10-CM | POA: Diagnosis not present

## 2016-10-28 DIAGNOSIS — Z6831 Body mass index (BMI) 31.0-31.9, adult: Secondary | ICD-10-CM | POA: Diagnosis not present

## 2016-10-28 DIAGNOSIS — M1611 Unilateral primary osteoarthritis, right hip: Secondary | ICD-10-CM | POA: Diagnosis not present

## 2016-12-23 DIAGNOSIS — M47812 Spondylosis without myelopathy or radiculopathy, cervical region: Secondary | ICD-10-CM | POA: Diagnosis not present

## 2016-12-25 DIAGNOSIS — Z961 Presence of intraocular lens: Secondary | ICD-10-CM | POA: Diagnosis not present

## 2017-01-08 DIAGNOSIS — R1031 Right lower quadrant pain: Secondary | ICD-10-CM | POA: Diagnosis not present

## 2017-01-08 DIAGNOSIS — Z6831 Body mass index (BMI) 31.0-31.9, adult: Secondary | ICD-10-CM | POA: Diagnosis not present

## 2017-01-13 DIAGNOSIS — R1031 Right lower quadrant pain: Secondary | ICD-10-CM | POA: Diagnosis not present

## 2017-01-13 DIAGNOSIS — I700 Atherosclerosis of aorta: Secondary | ICD-10-CM | POA: Diagnosis not present

## 2017-01-13 DIAGNOSIS — K829 Disease of gallbladder, unspecified: Secondary | ICD-10-CM | POA: Diagnosis not present

## 2017-01-20 DIAGNOSIS — K219 Gastro-esophageal reflux disease without esophagitis: Secondary | ICD-10-CM | POA: Diagnosis not present

## 2017-01-20 DIAGNOSIS — E7800 Pure hypercholesterolemia, unspecified: Secondary | ICD-10-CM | POA: Diagnosis not present

## 2017-01-20 DIAGNOSIS — E1165 Type 2 diabetes mellitus with hyperglycemia: Secondary | ICD-10-CM | POA: Diagnosis not present

## 2017-01-20 DIAGNOSIS — D519 Vitamin B12 deficiency anemia, unspecified: Secondary | ICD-10-CM | POA: Diagnosis not present

## 2017-01-20 DIAGNOSIS — E11610 Type 2 diabetes mellitus with diabetic neuropathic arthropathy: Secondary | ICD-10-CM | POA: Diagnosis not present

## 2017-01-20 DIAGNOSIS — I10 Essential (primary) hypertension: Secondary | ICD-10-CM | POA: Diagnosis not present

## 2017-01-20 DIAGNOSIS — F419 Anxiety disorder, unspecified: Secondary | ICD-10-CM | POA: Diagnosis not present

## 2017-01-22 DIAGNOSIS — M1000 Idiopathic gout, unspecified site: Secondary | ICD-10-CM | POA: Diagnosis not present

## 2017-01-22 DIAGNOSIS — E7800 Pure hypercholesterolemia, unspecified: Secondary | ICD-10-CM | POA: Diagnosis not present

## 2017-01-22 DIAGNOSIS — Z6830 Body mass index (BMI) 30.0-30.9, adult: Secondary | ICD-10-CM | POA: Diagnosis not present

## 2017-01-22 DIAGNOSIS — M129 Arthropathy, unspecified: Secondary | ICD-10-CM | POA: Diagnosis not present

## 2017-01-22 DIAGNOSIS — E1140 Type 2 diabetes mellitus with diabetic neuropathy, unspecified: Secondary | ICD-10-CM | POA: Diagnosis not present

## 2017-01-22 DIAGNOSIS — I10 Essential (primary) hypertension: Secondary | ICD-10-CM | POA: Diagnosis not present

## 2017-01-22 DIAGNOSIS — E1165 Type 2 diabetes mellitus with hyperglycemia: Secondary | ICD-10-CM | POA: Diagnosis not present

## 2017-01-22 DIAGNOSIS — Z0001 Encounter for general adult medical examination with abnormal findings: Secondary | ICD-10-CM | POA: Diagnosis not present

## 2017-01-22 DIAGNOSIS — M25512 Pain in left shoulder: Secondary | ICD-10-CM | POA: Diagnosis not present

## 2017-01-22 DIAGNOSIS — F419 Anxiety disorder, unspecified: Secondary | ICD-10-CM | POA: Diagnosis not present

## 2017-01-22 DIAGNOSIS — M5000 Cervical disc disorder with myelopathy, unspecified cervical region: Secondary | ICD-10-CM | POA: Diagnosis not present

## 2017-06-05 DIAGNOSIS — R05 Cough: Secondary | ICD-10-CM | POA: Diagnosis not present

## 2017-06-05 DIAGNOSIS — Z6830 Body mass index (BMI) 30.0-30.9, adult: Secondary | ICD-10-CM | POA: Diagnosis not present

## 2017-06-05 DIAGNOSIS — J0101 Acute recurrent maxillary sinusitis: Secondary | ICD-10-CM | POA: Diagnosis not present

## 2017-06-23 DIAGNOSIS — Z981 Arthrodesis status: Secondary | ICD-10-CM | POA: Diagnosis not present

## 2017-06-23 DIAGNOSIS — M4802 Spinal stenosis, cervical region: Secondary | ICD-10-CM | POA: Diagnosis not present

## 2017-06-23 DIAGNOSIS — M47812 Spondylosis without myelopathy or radiculopathy, cervical region: Secondary | ICD-10-CM | POA: Diagnosis not present

## 2017-06-24 DIAGNOSIS — Z6830 Body mass index (BMI) 30.0-30.9, adult: Secondary | ICD-10-CM | POA: Diagnosis not present

## 2017-06-24 DIAGNOSIS — J0101 Acute recurrent maxillary sinusitis: Secondary | ICD-10-CM | POA: Diagnosis not present

## 2017-06-24 DIAGNOSIS — R05 Cough: Secondary | ICD-10-CM | POA: Diagnosis not present

## 2017-06-24 DIAGNOSIS — J050 Acute obstructive laryngitis [croup]: Secondary | ICD-10-CM | POA: Diagnosis not present

## 2017-06-30 DIAGNOSIS — Z6830 Body mass index (BMI) 30.0-30.9, adult: Secondary | ICD-10-CM | POA: Diagnosis not present

## 2017-06-30 DIAGNOSIS — M1711 Unilateral primary osteoarthritis, right knee: Secondary | ICD-10-CM | POA: Diagnosis not present

## 2017-06-30 DIAGNOSIS — M4722 Other spondylosis with radiculopathy, cervical region: Secondary | ICD-10-CM | POA: Diagnosis not present

## 2017-06-30 DIAGNOSIS — E1165 Type 2 diabetes mellitus with hyperglycemia: Secondary | ICD-10-CM | POA: Diagnosis not present

## 2017-07-07 DIAGNOSIS — Z6830 Body mass index (BMI) 30.0-30.9, adult: Secondary | ICD-10-CM | POA: Diagnosis not present

## 2017-07-07 DIAGNOSIS — M4722 Other spondylosis with radiculopathy, cervical region: Secondary | ICD-10-CM | POA: Diagnosis not present

## 2017-07-07 DIAGNOSIS — M1711 Unilateral primary osteoarthritis, right knee: Secondary | ICD-10-CM | POA: Diagnosis not present

## 2017-07-07 DIAGNOSIS — J0101 Acute recurrent maxillary sinusitis: Secondary | ICD-10-CM | POA: Diagnosis not present

## 2017-07-07 DIAGNOSIS — E1165 Type 2 diabetes mellitus with hyperglycemia: Secondary | ICD-10-CM | POA: Diagnosis not present

## 2017-07-14 DIAGNOSIS — M1711 Unilateral primary osteoarthritis, right knee: Secondary | ICD-10-CM | POA: Diagnosis not present

## 2017-07-14 DIAGNOSIS — Z6830 Body mass index (BMI) 30.0-30.9, adult: Secondary | ICD-10-CM | POA: Diagnosis not present

## 2017-07-14 DIAGNOSIS — M4722 Other spondylosis with radiculopathy, cervical region: Secondary | ICD-10-CM | POA: Diagnosis not present

## 2017-07-14 DIAGNOSIS — E1165 Type 2 diabetes mellitus with hyperglycemia: Secondary | ICD-10-CM | POA: Diagnosis not present

## 2017-07-27 DIAGNOSIS — I10 Essential (primary) hypertension: Secondary | ICD-10-CM | POA: Diagnosis not present

## 2017-07-27 DIAGNOSIS — K219 Gastro-esophageal reflux disease without esophagitis: Secondary | ICD-10-CM | POA: Diagnosis not present

## 2017-07-27 DIAGNOSIS — E1165 Type 2 diabetes mellitus with hyperglycemia: Secondary | ICD-10-CM | POA: Diagnosis not present

## 2017-07-27 DIAGNOSIS — E11610 Type 2 diabetes mellitus with diabetic neuropathic arthropathy: Secondary | ICD-10-CM | POA: Diagnosis not present

## 2017-07-30 DIAGNOSIS — Z6829 Body mass index (BMI) 29.0-29.9, adult: Secondary | ICD-10-CM | POA: Diagnosis not present

## 2017-07-30 DIAGNOSIS — M25461 Effusion, right knee: Secondary | ICD-10-CM | POA: Diagnosis not present

## 2017-07-30 DIAGNOSIS — F419 Anxiety disorder, unspecified: Secondary | ICD-10-CM | POA: Diagnosis not present

## 2017-07-30 DIAGNOSIS — E1140 Type 2 diabetes mellitus with diabetic neuropathy, unspecified: Secondary | ICD-10-CM | POA: Diagnosis not present

## 2017-07-30 DIAGNOSIS — M129 Arthropathy, unspecified: Secondary | ICD-10-CM | POA: Diagnosis not present

## 2017-08-03 DIAGNOSIS — M65332 Trigger finger, left middle finger: Secondary | ICD-10-CM | POA: Diagnosis not present

## 2017-08-03 DIAGNOSIS — M79642 Pain in left hand: Secondary | ICD-10-CM | POA: Diagnosis not present

## 2017-08-03 DIAGNOSIS — M79641 Pain in right hand: Secondary | ICD-10-CM | POA: Diagnosis not present

## 2017-08-03 DIAGNOSIS — M65331 Trigger finger, right middle finger: Secondary | ICD-10-CM | POA: Diagnosis not present

## 2017-09-02 DIAGNOSIS — M79642 Pain in left hand: Secondary | ICD-10-CM | POA: Diagnosis not present

## 2017-09-02 DIAGNOSIS — M79641 Pain in right hand: Secondary | ICD-10-CM | POA: Diagnosis not present

## 2017-09-02 DIAGNOSIS — M65332 Trigger finger, left middle finger: Secondary | ICD-10-CM | POA: Diagnosis not present

## 2017-09-02 DIAGNOSIS — M65331 Trigger finger, right middle finger: Secondary | ICD-10-CM | POA: Diagnosis not present

## 2017-09-15 DIAGNOSIS — E11319 Type 2 diabetes mellitus with unspecified diabetic retinopathy without macular edema: Secondary | ICD-10-CM | POA: Diagnosis not present

## 2017-09-25 DIAGNOSIS — M47812 Spondylosis without myelopathy or radiculopathy, cervical region: Secondary | ICD-10-CM | POA: Diagnosis not present

## 2017-09-25 DIAGNOSIS — M65332 Trigger finger, left middle finger: Secondary | ICD-10-CM | POA: Diagnosis not present

## 2017-10-02 DIAGNOSIS — Z6830 Body mass index (BMI) 30.0-30.9, adult: Secondary | ICD-10-CM | POA: Diagnosis not present

## 2017-10-02 DIAGNOSIS — L03031 Cellulitis of right toe: Secondary | ICD-10-CM | POA: Diagnosis not present

## 2017-10-12 DIAGNOSIS — L03031 Cellulitis of right toe: Secondary | ICD-10-CM | POA: Diagnosis not present

## 2017-10-12 DIAGNOSIS — Z6830 Body mass index (BMI) 30.0-30.9, adult: Secondary | ICD-10-CM | POA: Diagnosis not present

## 2017-10-27 DIAGNOSIS — S91109A Unspecified open wound of unspecified toe(s) without damage to nail, initial encounter: Secondary | ICD-10-CM | POA: Diagnosis not present

## 2017-10-27 DIAGNOSIS — Z6830 Body mass index (BMI) 30.0-30.9, adult: Secondary | ICD-10-CM | POA: Diagnosis not present

## 2017-11-09 DIAGNOSIS — Z6831 Body mass index (BMI) 31.0-31.9, adult: Secondary | ICD-10-CM | POA: Diagnosis not present

## 2017-11-09 DIAGNOSIS — S91109A Unspecified open wound of unspecified toe(s) without damage to nail, initial encounter: Secondary | ICD-10-CM | POA: Diagnosis not present

## 2017-12-17 DIAGNOSIS — E119 Type 2 diabetes mellitus without complications: Secondary | ICD-10-CM | POA: Diagnosis not present

## 2017-12-17 DIAGNOSIS — Z7984 Long term (current) use of oral hypoglycemic drugs: Secondary | ICD-10-CM | POA: Diagnosis not present

## 2017-12-17 DIAGNOSIS — J441 Chronic obstructive pulmonary disease with (acute) exacerbation: Secondary | ICD-10-CM | POA: Diagnosis not present

## 2017-12-17 DIAGNOSIS — Z87891 Personal history of nicotine dependence: Secondary | ICD-10-CM | POA: Diagnosis not present

## 2017-12-17 DIAGNOSIS — R05 Cough: Secondary | ICD-10-CM | POA: Diagnosis not present

## 2017-12-17 DIAGNOSIS — E1165 Type 2 diabetes mellitus with hyperglycemia: Secondary | ICD-10-CM | POA: Diagnosis not present

## 2017-12-17 DIAGNOSIS — R0902 Hypoxemia: Secondary | ICD-10-CM | POA: Diagnosis not present

## 2017-12-17 DIAGNOSIS — K219 Gastro-esophageal reflux disease without esophagitis: Secondary | ICD-10-CM | POA: Diagnosis not present

## 2017-12-17 DIAGNOSIS — R531 Weakness: Secondary | ICD-10-CM | POA: Diagnosis not present

## 2017-12-17 DIAGNOSIS — R0602 Shortness of breath: Secondary | ICD-10-CM | POA: Diagnosis not present

## 2017-12-17 DIAGNOSIS — I10 Essential (primary) hypertension: Secondary | ICD-10-CM | POA: Diagnosis not present

## 2017-12-17 DIAGNOSIS — J45901 Unspecified asthma with (acute) exacerbation: Secondary | ICD-10-CM | POA: Diagnosis not present

## 2017-12-17 DIAGNOSIS — M109 Gout, unspecified: Secondary | ICD-10-CM | POA: Diagnosis not present

## 2017-12-18 DIAGNOSIS — E119 Type 2 diabetes mellitus without complications: Secondary | ICD-10-CM | POA: Diagnosis not present

## 2017-12-18 DIAGNOSIS — R531 Weakness: Secondary | ICD-10-CM | POA: Diagnosis present

## 2017-12-18 DIAGNOSIS — M109 Gout, unspecified: Secondary | ICD-10-CM | POA: Diagnosis not present

## 2017-12-18 DIAGNOSIS — Z7984 Long term (current) use of oral hypoglycemic drugs: Secondary | ICD-10-CM | POA: Diagnosis not present

## 2017-12-18 DIAGNOSIS — E1165 Type 2 diabetes mellitus with hyperglycemia: Secondary | ICD-10-CM | POA: Diagnosis not present

## 2017-12-18 DIAGNOSIS — R0902 Hypoxemia: Secondary | ICD-10-CM | POA: Diagnosis present

## 2017-12-18 DIAGNOSIS — Z87891 Personal history of nicotine dependence: Secondary | ICD-10-CM | POA: Diagnosis not present

## 2017-12-18 DIAGNOSIS — K219 Gastro-esophageal reflux disease without esophagitis: Secondary | ICD-10-CM | POA: Diagnosis present

## 2017-12-18 DIAGNOSIS — J441 Chronic obstructive pulmonary disease with (acute) exacerbation: Secondary | ICD-10-CM | POA: Diagnosis not present

## 2017-12-18 DIAGNOSIS — H9190 Unspecified hearing loss, unspecified ear: Secondary | ICD-10-CM | POA: Diagnosis present

## 2017-12-18 DIAGNOSIS — I10 Essential (primary) hypertension: Secondary | ICD-10-CM | POA: Diagnosis not present

## 2017-12-18 DIAGNOSIS — R0602 Shortness of breath: Secondary | ICD-10-CM | POA: Diagnosis not present

## 2017-12-18 DIAGNOSIS — T380X5A Adverse effect of glucocorticoids and synthetic analogues, initial encounter: Secondary | ICD-10-CM | POA: Diagnosis not present

## 2017-12-29 DIAGNOSIS — M47812 Spondylosis without myelopathy or radiculopathy, cervical region: Secondary | ICD-10-CM | POA: Diagnosis not present

## 2018-01-18 DIAGNOSIS — E11621 Type 2 diabetes mellitus with foot ulcer: Secondary | ICD-10-CM | POA: Diagnosis not present

## 2018-01-18 DIAGNOSIS — E1140 Type 2 diabetes mellitus with diabetic neuropathy, unspecified: Secondary | ICD-10-CM | POA: Diagnosis not present

## 2018-01-18 DIAGNOSIS — L97509 Non-pressure chronic ulcer of other part of unspecified foot with unspecified severity: Secondary | ICD-10-CM | POA: Diagnosis not present

## 2018-01-18 DIAGNOSIS — J441 Chronic obstructive pulmonary disease with (acute) exacerbation: Secondary | ICD-10-CM | POA: Diagnosis not present

## 2018-01-18 DIAGNOSIS — M1000 Idiopathic gout, unspecified site: Secondary | ICD-10-CM | POA: Diagnosis not present

## 2018-01-18 DIAGNOSIS — M5000 Cervical disc disorder with myelopathy, unspecified cervical region: Secondary | ICD-10-CM | POA: Diagnosis not present

## 2018-01-18 DIAGNOSIS — J439 Emphysema, unspecified: Secondary | ICD-10-CM | POA: Diagnosis not present

## 2018-01-18 DIAGNOSIS — I10 Essential (primary) hypertension: Secondary | ICD-10-CM | POA: Diagnosis not present

## 2018-01-20 DIAGNOSIS — K219 Gastro-esophageal reflux disease without esophagitis: Secondary | ICD-10-CM | POA: Diagnosis not present

## 2018-01-20 DIAGNOSIS — E1140 Type 2 diabetes mellitus with diabetic neuropathy, unspecified: Secondary | ICD-10-CM | POA: Diagnosis not present

## 2018-01-20 DIAGNOSIS — E1165 Type 2 diabetes mellitus with hyperglycemia: Secondary | ICD-10-CM | POA: Diagnosis not present

## 2018-01-20 DIAGNOSIS — I10 Essential (primary) hypertension: Secondary | ICD-10-CM | POA: Diagnosis not present

## 2018-01-20 DIAGNOSIS — Z6829 Body mass index (BMI) 29.0-29.9, adult: Secondary | ICD-10-CM | POA: Diagnosis not present

## 2018-01-20 DIAGNOSIS — Z0001 Encounter for general adult medical examination with abnormal findings: Secondary | ICD-10-CM | POA: Diagnosis not present

## 2018-01-26 DIAGNOSIS — E11610 Type 2 diabetes mellitus with diabetic neuropathic arthropathy: Secondary | ICD-10-CM | POA: Diagnosis not present

## 2018-01-26 DIAGNOSIS — Z6830 Body mass index (BMI) 30.0-30.9, adult: Secondary | ICD-10-CM | POA: Diagnosis not present

## 2018-01-26 DIAGNOSIS — J439 Emphysema, unspecified: Secondary | ICD-10-CM | POA: Diagnosis not present

## 2018-01-26 DIAGNOSIS — E1140 Type 2 diabetes mellitus with diabetic neuropathy, unspecified: Secondary | ICD-10-CM | POA: Diagnosis not present

## 2018-01-26 DIAGNOSIS — M1000 Idiopathic gout, unspecified site: Secondary | ICD-10-CM | POA: Diagnosis not present

## 2018-01-26 DIAGNOSIS — R1310 Dysphagia, unspecified: Secondary | ICD-10-CM | POA: Diagnosis not present

## 2018-01-26 DIAGNOSIS — E785 Hyperlipidemia, unspecified: Secondary | ICD-10-CM | POA: Diagnosis not present

## 2018-01-26 DIAGNOSIS — I10 Essential (primary) hypertension: Secondary | ICD-10-CM | POA: Diagnosis not present

## 2018-02-15 DIAGNOSIS — Z299 Encounter for prophylactic measures, unspecified: Secondary | ICD-10-CM | POA: Diagnosis not present

## 2018-02-15 DIAGNOSIS — E1165 Type 2 diabetes mellitus with hyperglycemia: Secondary | ICD-10-CM | POA: Diagnosis not present

## 2018-02-15 DIAGNOSIS — Z6832 Body mass index (BMI) 32.0-32.9, adult: Secondary | ICD-10-CM | POA: Diagnosis not present

## 2018-02-15 DIAGNOSIS — R0989 Other specified symptoms and signs involving the circulatory and respiratory systems: Secondary | ICD-10-CM | POA: Diagnosis not present

## 2018-02-15 DIAGNOSIS — R011 Cardiac murmur, unspecified: Secondary | ICD-10-CM | POA: Diagnosis not present

## 2018-02-15 DIAGNOSIS — F419 Anxiety disorder, unspecified: Secondary | ICD-10-CM | POA: Diagnosis not present

## 2018-02-26 DIAGNOSIS — I6529 Occlusion and stenosis of unspecified carotid artery: Secondary | ICD-10-CM | POA: Diagnosis not present

## 2018-02-26 DIAGNOSIS — R010 Benign and innocent cardiac murmurs: Secondary | ICD-10-CM | POA: Diagnosis not present

## 2018-03-16 DIAGNOSIS — E11319 Type 2 diabetes mellitus with unspecified diabetic retinopathy without macular edema: Secondary | ICD-10-CM | POA: Diagnosis not present

## 2018-04-02 DIAGNOSIS — Z299 Encounter for prophylactic measures, unspecified: Secondary | ICD-10-CM | POA: Diagnosis not present

## 2018-04-02 DIAGNOSIS — Z6832 Body mass index (BMI) 32.0-32.9, adult: Secondary | ICD-10-CM | POA: Diagnosis not present

## 2018-04-02 DIAGNOSIS — E11649 Type 2 diabetes mellitus with hypoglycemia without coma: Secondary | ICD-10-CM | POA: Diagnosis not present

## 2018-04-02 DIAGNOSIS — M1711 Unilateral primary osteoarthritis, right knee: Secondary | ICD-10-CM | POA: Diagnosis not present

## 2018-04-16 DIAGNOSIS — M170 Bilateral primary osteoarthritis of knee: Secondary | ICD-10-CM | POA: Diagnosis not present

## 2018-04-16 DIAGNOSIS — I10 Essential (primary) hypertension: Secondary | ICD-10-CM | POA: Diagnosis not present

## 2018-04-16 DIAGNOSIS — M1711 Unilateral primary osteoarthritis, right knee: Secondary | ICD-10-CM | POA: Diagnosis not present

## 2018-04-16 DIAGNOSIS — R262 Difficulty in walking, not elsewhere classified: Secondary | ICD-10-CM | POA: Diagnosis not present

## 2018-04-16 DIAGNOSIS — Z6832 Body mass index (BMI) 32.0-32.9, adult: Secondary | ICD-10-CM | POA: Diagnosis not present

## 2018-04-16 DIAGNOSIS — M1712 Unilateral primary osteoarthritis, left knee: Secondary | ICD-10-CM | POA: Diagnosis not present

## 2018-04-16 DIAGNOSIS — Z299 Encounter for prophylactic measures, unspecified: Secondary | ICD-10-CM | POA: Diagnosis not present

## 2018-04-19 DIAGNOSIS — M1711 Unilateral primary osteoarthritis, right knee: Secondary | ICD-10-CM | POA: Diagnosis not present

## 2018-04-19 DIAGNOSIS — I10 Essential (primary) hypertension: Secondary | ICD-10-CM | POA: Diagnosis not present

## 2018-04-19 DIAGNOSIS — Z299 Encounter for prophylactic measures, unspecified: Secondary | ICD-10-CM | POA: Diagnosis not present

## 2018-04-19 DIAGNOSIS — Z6832 Body mass index (BMI) 32.0-32.9, adult: Secondary | ICD-10-CM | POA: Diagnosis not present

## 2018-04-20 DIAGNOSIS — R260 Ataxic gait: Secondary | ICD-10-CM | POA: Diagnosis not present

## 2018-04-20 DIAGNOSIS — M170 Bilateral primary osteoarthritis of knee: Secondary | ICD-10-CM | POA: Diagnosis not present

## 2018-04-20 DIAGNOSIS — Z299 Encounter for prophylactic measures, unspecified: Secondary | ICD-10-CM | POA: Diagnosis not present

## 2018-04-20 DIAGNOSIS — M1712 Unilateral primary osteoarthritis, left knee: Secondary | ICD-10-CM | POA: Diagnosis not present

## 2018-04-20 DIAGNOSIS — Z6832 Body mass index (BMI) 32.0-32.9, adult: Secondary | ICD-10-CM | POA: Diagnosis not present

## 2018-04-20 DIAGNOSIS — M25562 Pain in left knee: Secondary | ICD-10-CM | POA: Diagnosis not present

## 2018-04-21 DIAGNOSIS — M1711 Unilateral primary osteoarthritis, right knee: Secondary | ICD-10-CM | POA: Diagnosis not present

## 2018-04-21 DIAGNOSIS — E11649 Type 2 diabetes mellitus with hypoglycemia without coma: Secondary | ICD-10-CM | POA: Diagnosis not present

## 2018-04-21 DIAGNOSIS — Z299 Encounter for prophylactic measures, unspecified: Secondary | ICD-10-CM | POA: Diagnosis not present

## 2018-04-21 DIAGNOSIS — M170 Bilateral primary osteoarthritis of knee: Secondary | ICD-10-CM | POA: Diagnosis not present

## 2018-04-21 DIAGNOSIS — R260 Ataxic gait: Secondary | ICD-10-CM | POA: Diagnosis not present

## 2018-04-21 DIAGNOSIS — Z6832 Body mass index (BMI) 32.0-32.9, adult: Secondary | ICD-10-CM | POA: Diagnosis not present

## 2018-04-22 DIAGNOSIS — I10 Essential (primary) hypertension: Secondary | ICD-10-CM | POA: Diagnosis not present

## 2018-04-22 DIAGNOSIS — Z6832 Body mass index (BMI) 32.0-32.9, adult: Secondary | ICD-10-CM | POA: Diagnosis not present

## 2018-04-22 DIAGNOSIS — Z299 Encounter for prophylactic measures, unspecified: Secondary | ICD-10-CM | POA: Diagnosis not present

## 2018-04-22 DIAGNOSIS — M2560 Stiffness of unspecified joint, not elsewhere classified: Secondary | ICD-10-CM | POA: Diagnosis not present

## 2018-04-22 DIAGNOSIS — M1712 Unilateral primary osteoarthritis, left knee: Secondary | ICD-10-CM | POA: Diagnosis not present

## 2018-04-22 DIAGNOSIS — R260 Ataxic gait: Secondary | ICD-10-CM | POA: Diagnosis not present

## 2018-04-27 DIAGNOSIS — I10 Essential (primary) hypertension: Secondary | ICD-10-CM | POA: Diagnosis not present

## 2018-04-27 DIAGNOSIS — Z6832 Body mass index (BMI) 32.0-32.9, adult: Secondary | ICD-10-CM | POA: Diagnosis not present

## 2018-04-27 DIAGNOSIS — Z299 Encounter for prophylactic measures, unspecified: Secondary | ICD-10-CM | POA: Diagnosis not present

## 2018-04-27 DIAGNOSIS — M1712 Unilateral primary osteoarthritis, left knee: Secondary | ICD-10-CM | POA: Diagnosis not present

## 2018-04-27 DIAGNOSIS — M2560 Stiffness of unspecified joint, not elsewhere classified: Secondary | ICD-10-CM | POA: Diagnosis not present

## 2018-04-27 DIAGNOSIS — M170 Bilateral primary osteoarthritis of knee: Secondary | ICD-10-CM | POA: Diagnosis not present

## 2018-04-29 DIAGNOSIS — Z6831 Body mass index (BMI) 31.0-31.9, adult: Secondary | ICD-10-CM | POA: Diagnosis not present

## 2018-04-29 DIAGNOSIS — I10 Essential (primary) hypertension: Secondary | ICD-10-CM | POA: Diagnosis not present

## 2018-04-29 DIAGNOSIS — Z299 Encounter for prophylactic measures, unspecified: Secondary | ICD-10-CM | POA: Diagnosis not present

## 2018-04-29 DIAGNOSIS — M1712 Unilateral primary osteoarthritis, left knee: Secondary | ICD-10-CM | POA: Diagnosis not present

## 2018-05-04 DIAGNOSIS — Z6831 Body mass index (BMI) 31.0-31.9, adult: Secondary | ICD-10-CM | POA: Diagnosis not present

## 2018-05-04 DIAGNOSIS — I10 Essential (primary) hypertension: Secondary | ICD-10-CM | POA: Diagnosis not present

## 2018-05-04 DIAGNOSIS — M1711 Unilateral primary osteoarthritis, right knee: Secondary | ICD-10-CM | POA: Diagnosis not present

## 2018-05-04 DIAGNOSIS — M1712 Unilateral primary osteoarthritis, left knee: Secondary | ICD-10-CM | POA: Diagnosis not present

## 2018-05-04 DIAGNOSIS — Z299 Encounter for prophylactic measures, unspecified: Secondary | ICD-10-CM | POA: Diagnosis not present

## 2018-05-06 DIAGNOSIS — E1165 Type 2 diabetes mellitus with hyperglycemia: Secondary | ICD-10-CM | POA: Diagnosis not present

## 2018-05-06 DIAGNOSIS — M1712 Unilateral primary osteoarthritis, left knee: Secondary | ICD-10-CM | POA: Diagnosis not present

## 2018-05-06 DIAGNOSIS — N400 Enlarged prostate without lower urinary tract symptoms: Secondary | ICD-10-CM | POA: Diagnosis not present

## 2018-05-06 DIAGNOSIS — M170 Bilateral primary osteoarthritis of knee: Secondary | ICD-10-CM | POA: Diagnosis not present

## 2018-05-06 DIAGNOSIS — Z299 Encounter for prophylactic measures, unspecified: Secondary | ICD-10-CM | POA: Diagnosis not present

## 2018-05-06 DIAGNOSIS — Z6831 Body mass index (BMI) 31.0-31.9, adult: Secondary | ICD-10-CM | POA: Diagnosis not present

## 2018-05-06 DIAGNOSIS — I10 Essential (primary) hypertension: Secondary | ICD-10-CM | POA: Diagnosis not present

## 2018-05-07 DIAGNOSIS — E7800 Pure hypercholesterolemia, unspecified: Secondary | ICD-10-CM | POA: Diagnosis not present

## 2018-05-07 DIAGNOSIS — Z299 Encounter for prophylactic measures, unspecified: Secondary | ICD-10-CM | POA: Diagnosis not present

## 2018-05-07 DIAGNOSIS — I10 Essential (primary) hypertension: Secondary | ICD-10-CM | POA: Diagnosis not present

## 2018-05-07 DIAGNOSIS — E1165 Type 2 diabetes mellitus with hyperglycemia: Secondary | ICD-10-CM | POA: Diagnosis not present

## 2018-05-07 DIAGNOSIS — Z6831 Body mass index (BMI) 31.0-31.9, adult: Secondary | ICD-10-CM | POA: Diagnosis not present

## 2018-05-11 DIAGNOSIS — E11649 Type 2 diabetes mellitus with hypoglycemia without coma: Secondary | ICD-10-CM | POA: Diagnosis not present

## 2018-05-11 DIAGNOSIS — R269 Unspecified abnormalities of gait and mobility: Secondary | ICD-10-CM | POA: Diagnosis not present

## 2018-05-11 DIAGNOSIS — M1711 Unilateral primary osteoarthritis, right knee: Secondary | ICD-10-CM | POA: Diagnosis not present

## 2018-05-11 DIAGNOSIS — Z299 Encounter for prophylactic measures, unspecified: Secondary | ICD-10-CM | POA: Diagnosis not present

## 2018-05-11 DIAGNOSIS — Z6831 Body mass index (BMI) 31.0-31.9, adult: Secondary | ICD-10-CM | POA: Diagnosis not present

## 2018-05-11 DIAGNOSIS — I10 Essential (primary) hypertension: Secondary | ICD-10-CM | POA: Diagnosis not present

## 2018-05-11 DIAGNOSIS — M2560 Stiffness of unspecified joint, not elsewhere classified: Secondary | ICD-10-CM | POA: Diagnosis not present

## 2018-05-11 DIAGNOSIS — R262 Difficulty in walking, not elsewhere classified: Secondary | ICD-10-CM | POA: Diagnosis not present

## 2018-05-13 DIAGNOSIS — M25562 Pain in left knee: Secondary | ICD-10-CM | POA: Diagnosis not present

## 2018-05-13 DIAGNOSIS — M170 Bilateral primary osteoarthritis of knee: Secondary | ICD-10-CM | POA: Diagnosis not present

## 2018-05-13 DIAGNOSIS — Z6831 Body mass index (BMI) 31.0-31.9, adult: Secondary | ICD-10-CM | POA: Diagnosis not present

## 2018-05-13 DIAGNOSIS — I10 Essential (primary) hypertension: Secondary | ICD-10-CM | POA: Diagnosis not present

## 2018-05-13 DIAGNOSIS — Z299 Encounter for prophylactic measures, unspecified: Secondary | ICD-10-CM | POA: Diagnosis not present

## 2018-05-18 DIAGNOSIS — Z299 Encounter for prophylactic measures, unspecified: Secondary | ICD-10-CM | POA: Diagnosis not present

## 2018-05-18 DIAGNOSIS — M1711 Unilateral primary osteoarthritis, right knee: Secondary | ICD-10-CM | POA: Diagnosis not present

## 2018-05-18 DIAGNOSIS — Z6831 Body mass index (BMI) 31.0-31.9, adult: Secondary | ICD-10-CM | POA: Diagnosis not present

## 2018-05-18 DIAGNOSIS — I10 Essential (primary) hypertension: Secondary | ICD-10-CM | POA: Diagnosis not present

## 2018-05-20 DIAGNOSIS — Z6831 Body mass index (BMI) 31.0-31.9, adult: Secondary | ICD-10-CM | POA: Diagnosis not present

## 2018-05-20 DIAGNOSIS — N400 Enlarged prostate without lower urinary tract symptoms: Secondary | ICD-10-CM | POA: Diagnosis not present

## 2018-05-20 DIAGNOSIS — E1165 Type 2 diabetes mellitus with hyperglycemia: Secondary | ICD-10-CM | POA: Diagnosis not present

## 2018-05-20 DIAGNOSIS — M1712 Unilateral primary osteoarthritis, left knee: Secondary | ICD-10-CM | POA: Diagnosis not present

## 2018-05-20 DIAGNOSIS — Z299 Encounter for prophylactic measures, unspecified: Secondary | ICD-10-CM | POA: Diagnosis not present

## 2018-05-20 DIAGNOSIS — I10 Essential (primary) hypertension: Secondary | ICD-10-CM | POA: Diagnosis not present

## 2018-05-20 DIAGNOSIS — M170 Bilateral primary osteoarthritis of knee: Secondary | ICD-10-CM | POA: Diagnosis not present

## 2018-05-26 DIAGNOSIS — E1165 Type 2 diabetes mellitus with hyperglycemia: Secondary | ICD-10-CM | POA: Diagnosis not present

## 2018-05-26 DIAGNOSIS — Z6831 Body mass index (BMI) 31.0-31.9, adult: Secondary | ICD-10-CM | POA: Diagnosis not present

## 2018-05-26 DIAGNOSIS — E1142 Type 2 diabetes mellitus with diabetic polyneuropathy: Secondary | ICD-10-CM | POA: Diagnosis not present

## 2018-05-26 DIAGNOSIS — I10 Essential (primary) hypertension: Secondary | ICD-10-CM | POA: Diagnosis not present

## 2018-05-26 DIAGNOSIS — Z299 Encounter for prophylactic measures, unspecified: Secondary | ICD-10-CM | POA: Diagnosis not present

## 2018-06-24 DIAGNOSIS — F419 Anxiety disorder, unspecified: Secondary | ICD-10-CM | POA: Diagnosis not present

## 2018-06-24 DIAGNOSIS — E1142 Type 2 diabetes mellitus with diabetic polyneuropathy: Secondary | ICD-10-CM | POA: Diagnosis not present

## 2018-06-24 DIAGNOSIS — Z6831 Body mass index (BMI) 31.0-31.9, adult: Secondary | ICD-10-CM | POA: Diagnosis not present

## 2018-06-24 DIAGNOSIS — Z299 Encounter for prophylactic measures, unspecified: Secondary | ICD-10-CM | POA: Diagnosis not present

## 2018-06-24 DIAGNOSIS — E1165 Type 2 diabetes mellitus with hyperglycemia: Secondary | ICD-10-CM | POA: Diagnosis not present

## 2018-06-24 DIAGNOSIS — I10 Essential (primary) hypertension: Secondary | ICD-10-CM | POA: Diagnosis not present

## 2018-08-16 DIAGNOSIS — E1165 Type 2 diabetes mellitus with hyperglycemia: Secondary | ICD-10-CM | POA: Diagnosis not present

## 2018-08-16 DIAGNOSIS — I10 Essential (primary) hypertension: Secondary | ICD-10-CM | POA: Diagnosis not present

## 2018-08-16 DIAGNOSIS — M109 Gout, unspecified: Secondary | ICD-10-CM | POA: Diagnosis not present

## 2018-08-16 DIAGNOSIS — E1142 Type 2 diabetes mellitus with diabetic polyneuropathy: Secondary | ICD-10-CM | POA: Diagnosis not present

## 2018-08-16 DIAGNOSIS — Z6832 Body mass index (BMI) 32.0-32.9, adult: Secondary | ICD-10-CM | POA: Diagnosis not present

## 2018-08-16 DIAGNOSIS — Z299 Encounter for prophylactic measures, unspecified: Secondary | ICD-10-CM | POA: Diagnosis not present

## 2018-10-14 DIAGNOSIS — E11319 Type 2 diabetes mellitus with unspecified diabetic retinopathy without macular edema: Secondary | ICD-10-CM | POA: Diagnosis not present

## 2018-10-24 NOTE — Progress Notes (Addendum)
[facility] Note  10/25/2018     CHIEF COMPLAINT Patient presents for Retina Evaluation   HISTORY OF PRESENT ILLNESS: Kurt Duran is a 73 y.o. male who presents to the clinic today for:   HPI    Retina Evaluation    In both eyes.  This started weeks ago.  Duration of weeks.  Context:  distance vision.  I, the attending physician,  performed the HPI with the patient and updated documentation appropriately.          Comments    BS this: 186 Last HgA1c: 9.0 Patient states his vision is very good in both eyes since cataract surgery OU.  Patient had cataract surgery in 2017 and 2018, performed by Dr. Tonny Branch.  Patient denies eye pain or discomfort.  Patient denies floaters or fol.       Last edited by Bernarda Caffey, MD on 10/25/2018  9:53 AM. (History)    Patient states saw Dr. Radford Pax about 1 month ago for 6 month diabetic eye exam. Saw retinal bleeding on dilated exam. Patient hasn't had any recent vision changes. Patient denies any history of cancer, anemia,   Referring physician: Particia Nearing, [place],  [place] [address]  HISTORICAL INFORMATION:   Selected notes from the MEDICAL RECORD NUMBER Referred by Dr. Radford Pax for concern of BDR LEE:  Ocular Hx- PMH-anxiety, COPD, gout, HLD, DM (takes metformin, glipizide, januvia)   CURRENT MEDICATIONS: No current outpatient medications on file. (Ophthalmic Drugs)   No current facility-administered medications for this visit.  (Ophthalmic Drugs)   Current Outpatient Medications (Other)  Medication Sig  . albuterol (PROVENTIL HFA;VENTOLIN HFA) 108 (90 Base) MCG/ACT inhaler Inhale 2 puffs into the lungs every 4 (four) hours as needed for wheezing or shortness of breath.  . allopurinol (ZYLOPRIM) 300 MG tablet Take 300 mg by mouth daily.  Marland Kitchen ALPRAZolam (XANAX) 0.5 MG tablet Take 1 tablet by mouth 2 (two) times daily.  Marland Kitchen FLUoxetine (PROZAC) 20 MG capsule Take 20 mg by mouth  daily.  Marland Kitchen glipiZIDE (GLUCOTROL XL) 10 MG 24 hr tablet Take 10 mg by mouth 2 (two) times daily.  Marland Kitchen HYDROcodone-acetaminophen (NORCO/VICODIN) 5-325 MG tablet Take 0.5-1 tablets by mouth 2 (two) times daily. Pt takes 0.5 tablet every morning and 1 tablet at bedtime  . JANUVIA 100 MG tablet Take 1 tablet by mouth daily.  Marland Kitchen lovastatin (MEVACOR) 40 MG tablet Take 40 mg by mouth daily.  . metFORMIN (GLUCOPHAGE) 1000 MG tablet Take 1 tablet by mouth 2 (two) times daily.  . Multiple Minerals (CALCIUM-MAGNESIUM-ZINC) TABS Take 1 tablet by mouth 2 (two) times daily.  . Omega-3 Fatty Acids (FISH OIL) 1000 MG CAPS Take 2 capsules by mouth 2 (two) times daily.  Marland Kitchen omeprazole (PRILOSEC) 20 MG capsule Take 20 mg by mouth daily.  Marland Kitchen terazosin (HYTRIN) 10 MG capsule Take 10 mg by mouth daily.   No current facility-administered medications for this visit.  (Other)      REVIEW OF SYSTEMS: ROS    Positive for: Endocrine, Eyes   Negative for: Constitutional, Gastrointestinal, Neurological, Skin, Genitourinary, Musculoskeletal, HENT, Cardiovascular, Respiratory, Psychiatric, Allergic/Imm, Heme/Lymph   Last edited by Doneen Poisson on 10/25/2018  9:22 AM. (History)       ALLERGIES Allergies  Allergen Reactions  . Eggs Or Egg-Derived Products Nausea And Vomiting    PAST MEDICAL HISTORY Past Medical History:  Diagnosis Date  . Anxiety   .  COPD (chronic obstructive pulmonary disease) ([place])   . Diabetes mellitus without complication ([place])   . GERD (gastroesophageal reflux disease)   . Gout   . Hypertension    Past Surgical History:  Procedure Laterality Date  . CATARACT EXTRACTION W/PHACO Left 04/03/2016   Procedure: CATARACT EXTRACTION PHACO AND INTRAOCULAR LENS PLACEMENT LEFT EYE CDE= 11.33;  Surgeon: Tonny Branch, MD;  Location: [facility];  Service: Ophthalmology;  Laterality: Left;  left  . CATARACT EXTRACTION W/PHACO Right 04/28/2016   Procedure: CATARACT EXTRACTION PHACO AND INTRAOCULAR LENS  PLACEMENT (IOC);  Surgeon: Tonny Branch, MD;  Location: [facility];  Service: Ophthalmology;  Laterality: Right;  cde-10.23   . KNEE ARTHROSCOPY Right   . NECK SURGERY    . ROTATOR CUFF REPAIR Right     FAMILY HISTORY Family History  Adopted: Yes    SOCIAL HISTORY Social History   Tobacco Use  . Smoking status: Former Smoker    Types: Cigarettes    Quit date: 03/26/1989    Years since quitting: 29.6  . Smokeless tobacco: Never Used  Substance Use Topics  . Alcohol use: No  . Drug use: No         OPHTHALMIC EXAM:  Base Eye Exam    Visual Acuity (Snellen - Linear)      Right Left   Dist [place] 20/20 -2 20/20 -1       Tonometry (Tonopen, 9:27 AM)      Right Left   Pressure 13 13       Pupils      Dark Light Shape React APD   Right 2 1 Round Minimal 0   Left 2 1 Round Minimal 0       Extraocular Movement      Right Left    Full Full       Neuro/Psych    Oriented x3: Yes   Mood/Affect: Normal       Dilation    Both eyes: 1.0% Mydriacyl, 2.5% Phenylephrine @ 9:27 AM        Slit Lamp and Fundus Exam    Slit Lamp Exam      Right Left   Lids/Lashes dermatochalasis, mild MGD dermatochalasis, mild MGD   Conjunctiva/Sclera White and quiet White and quiet, temproal pinguecula   Cornea 1+ PEE, tear film debris, well healed temporal catarct wound 1+ PEE, tear film debris, well healed temporal catarct wound   Anterior Chamber deep and clear deep and clear   Iris round and moderately dilated to 5 mm, no NVI round and moderately dilated to 5 mm   Lens PCIOL in good position PCIOL in good position   Vitreous syneresis syneresis       Fundus Exam      Right Left   Disc mild pallor, temporal PPA mild pallor, temp PPA   C/D Ratio 0.6 0.35   Macula Multiple blot hemes superior macula, +MA, good foveal reflex, RPE mottling and clumping flat, blunted foveal reflex, +edema with cluster of MA, superonasal to fovea, scattered MA elsewhere   Vessels attenuated, +A/V crossing  changes attenuated, +A/V crossing changes   Periphery attached, scattered blot hemes +peripapillary flame hemes nasal to disc Attached, scattered MA; blot hemes superiorly        Refraction    Manifest Refraction      Sphere Cylinder Dist VA   Right -0.25 Sphere 20/20   Left -0.25 Sphere 20/20          IMAGING AND PROCEDURES  Imaging and Procedures for @TODAY @  OCT, Retina - OU - Both Eyes       Right Eye Quality was good. Central Foveal Thickness: 309. Progression has no prior data. Findings include normal foveal contour, no IRF, no SRF (Trace cystic changes, mild ERM).   Left Eye Quality was good. Central Foveal Thickness: 344. Progression has no prior data. Findings include abnormal foveal contour, no SRF, intraretinal fluid (Nasal DME, mild ERM).   Notes *Images captured and stored on drive  Diagnosis / Impression:  OD: NFP, no IRF/SRF, trace cystic changes, no frank DME OS: +DME nasal macula   Clinical management:  See below  Abbreviations: NFP - Normal foveal profile. CME - cystoid macular edema. PED - pigment epithelial detachment. IRF - intraretinal fluid. SRF - subretinal fluid. EZ - ellipsoid zone. ERM - epiretinal membrane. ORA - outer retinal atrophy. ORT - outer retinal tubulation. SRHM - subretinal hyper-reflective material        Fluorescein Angiography Optos (Transit OD)       Right Eye   Progression has no prior data. Early phase findings include blockage, microaneurysm. Mid/Late phase findings include blockage, microaneurysm, leakage (Very mild late leakage from MA's). Choroidal neovascularization is not present.   Left Eye   Progression has no prior data. Early phase findings include microaneurysm. Mid/Late phase findings include microaneurysm, leakage (Cluster of late leakage superonasal corresponding to DME on OCT).   Notes **Images stored on drive**  Impression: OD: severe NPDR without DME OS: severe NPDR with central  leakage/DME        Intravitreal Injection, Pharmacologic Agent - OS - Left Eye       Time Out 10/25/2018. 10:45 AM. Confirmed correct patient, procedure, site, and patient consented.   Anesthesia Topical anesthesia was used. Anesthetic medications included Lidocaine 2%, Proparacaine 0.5%.   Procedure Preparation included 5% betadine to ocular surface, eyelid speculum. A supplied needle was used.   Injection:  1.25 mg Bevacizumab (AVASTIN) SOLN   NDC: 16109-604-54, Lot: [id number] , Expiration date: 12/15/2018   Route: Intravitreal, Site: Left Eye, Waste: 0 mL  Post-op Post injection exam found visual acuity of at least counting fingers. The patient tolerated the procedure well. There were no complications. The patient received written and verbal post procedure care education.                 ASSESSMENT/PLAN:    ICD-10-CM   1. 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 Retinal edema  H35.81 OCT, Retina - OU - Both Eyes  4. Essential hypertension  I10   5. Hypertensive retinopathy of both eyes  H35.033 Fluorescein Angiography Optos (Transit OD)  6. Pseudophakia of both eyes  Z96.1     1-3. Severe non-proliferative diabetic retinopathy, both eyes - OD no DME - OS +DME - The incidence, risk factors for progression, natural history and treatment options for diabetic retinopathy were discussed with patient.   - The need for close monitoring of blood glucose, blood pressure, and serum lipids, avoiding cigarette or any type of tobacco, and the need for long term follow up was also discussed with patient. - BCVA 20/20 OU today - FA 10/25/18 - late  leaking MA  OU; no NV;  - OCT shows trace cystic changes, no frank DME OD: OS: +DME nasal macula The natural history, pathology, and characteristics of diabetic macular edema discussed with patient.  A generalized discussion of the major clinical trials concerning treatment of diabetic macular edema (ETDRS, DCT, SCORE, RISE / RIDE, and ongoing DRCR net studies) was completed.  This discussion included mention of the various approaches to treating diabetic macular edema (observation, laser photocoagulation, anti-VEGF injections with lucentis / Avastin / Eylea, steroid injections with Kenalog / Ozurdex, and intraocular surgery with vitrectomy).  The goal hemoglobin A1C of 6-7 was discussed, as well as importance of smoking cessation and hypertension control.  Need for ongoing treatment and monitoring were specifically discussed with reference to chronic nature of diabetic macular edema. - recommend IVA #1 OS today, 07.20.20 - pt wishes to proceed - RBA of procedure discussed, questions answered - informed consent obtained and signed - see procedure note - f/u in 4 wks  4,5.Hypertensive retinopathy OU - discussed importance of tight BP control - monitor  6. Pseudophakia OU  - s/p CE/IOL OU  - beautiful surgeries, doing well  - monitor   Ophthalmic Meds Ordered this visit:  Meds ordered this encounter  Medications  . Bevacizumab (AVASTIN) SOLN 1.25 mg       Return 4 weeks, for DFE, OCT.  There are no Patient Instructions on file for this visit.   Explained the diagnoses, plan, and follow up with the patient and they expressed understanding.  Patient expressed understanding of the importance of proper follow up care.   This document serves as a record of services personally performed by Gardiner Sleeper, MD, PhD. It was created on their behalf by Ernest Mallick, OA, an ophthalmic assistant. The creation of this record is the provider's dictation and/or activities during the visit.     Electronically signed by: Ernest Mallick, OA  07.19.2020 5:22 PM    Gardiner Sleeper, M.D., Ph.D. Diseases & Surgery of the Retina and Vitreous [facility]  I have reviewed the above documentation for accuracy and completeness, and I agree with the above. Gardiner Sleeper, M.D., Ph.D. 10/25/18 5:22 PM    Abbreviations: M myopia (nearsighted); A astigmatism; H hyperopia (farsighted); P presbyopia; Mrx spectacle prescription;  CTL contact lenses; OD right eye; OS left eye; OU both eyes  XT exotropia; ET esotropia; PEK punctate epithelial keratitis; PEE punctate epithelial erosions; DES dry eye syndrome; MGD meibomian gland dysfunction; ATs artificial tears; PFAT's preservative free artificial tears; [place] nuclear sclerotic cataract; PSC posterior subcapsular cataract; ERM epi-retinal membrane; PVD posterior vitreous detachment; RD retinal detachment; DM diabetes mellitus; DR diabetic retinopathy; NPDR non-proliferative diabetic retinopathy; PDR proliferative diabetic retinopathy; CSME clinically significant macular edema; DME diabetic macular edema; dbh dot blot hemorrhages; CWS cotton wool spot; POAG primary open angle glaucoma; C/D cup-to-disc ratio; HVF humphrey visual field; GVF goldmann visual field; OCT optical coherence tomography; IOP intraocular pressure; BRVO Branch retinal vein occlusion; CRVO central retinal vein occlusion; CRAO central retinal artery occlusion; BRAO branch retinal artery occlusion; RT retinal tear; SB scleral buckle; PPV pars plana vitrectomy; VH Vitreous hemorrhage; PRP panretinal laser photocoagulation; IVK intravitreal kenalog; VMT vitreomacular traction; MH Macular hole;  NVD neovascularization of the disc; NVE neovascularization elsewhere; AREDS age related eye disease study; ARMD age related macular degeneration; POAG primary open angle glaucoma; EBMD epithelial/anterior basement membrane dystrophy; ACIOL anterior chamber intraocular lens; IOL  intraocular lens; PCIOL posterior chamber intraocular lens; Phaco/IOL phacoemulsification with  intraocular lens placement; [place] photorefractive keratectomy; LASIK laser assisted in situ keratomileusis; HTN hypertension; DM diabetes mellitus; COPD chronic obstructive pulmonary disease

## 2018-10-25 ENCOUNTER — Encounter (INDEPENDENT_AMBULATORY_CARE_PROVIDER_SITE_OTHER): Payer: Self-pay | Admitting: Ophthalmology

## 2018-10-25 ENCOUNTER — Other Ambulatory Visit: Payer: Self-pay

## 2018-10-25 ENCOUNTER — Ambulatory Visit (INDEPENDENT_AMBULATORY_CARE_PROVIDER_SITE_OTHER): Payer: Medicare Other | Admitting: Ophthalmology

## 2018-10-25 DIAGNOSIS — H35033 Hypertensive retinopathy, bilateral: Secondary | ICD-10-CM | POA: Diagnosis not present

## 2018-10-25 DIAGNOSIS — I10 Essential (primary) hypertension: Secondary | ICD-10-CM

## 2018-10-25 DIAGNOSIS — Z961 Presence of intraocular lens: Secondary | ICD-10-CM

## 2018-10-25 DIAGNOSIS — E113491 Type 2 diabetes mellitus with severe nonproliferative diabetic retinopathy without macular edema, right eye: Secondary | ICD-10-CM | POA: Diagnosis not present

## 2018-10-25 DIAGNOSIS — E113412 Type 2 diabetes mellitus with severe nonproliferative diabetic retinopathy with macular edema, left eye: Secondary | ICD-10-CM | POA: Diagnosis not present

## 2018-10-25 DIAGNOSIS — H3581 Retinal edema: Secondary | ICD-10-CM | POA: Diagnosis not present

## 2018-10-25 MED ORDER — BEVACIZUMAB CHEMO INJECTION 1.25MG/0.05ML SYRINGE FOR KALEIDOSCOPE
1.2500 mg | INTRAVITREAL | Status: AC | PRN
  Administered 2018-10-25: 1.25 mg via INTRAVITREAL

## 2018-11-05 ENCOUNTER — Other Ambulatory Visit: Payer: Self-pay

## 2018-11-21 NOTE — Progress Notes (Signed)
[facility] Note  11/22/2018     CHIEF COMPLAINT Patient presents for Retina Follow Up   HISTORY OF PRESENT ILLNESS: Kurt Duran is a 73 y.o. male who presents to the clinic today for:   HPI    Retina Follow Up    Patient presents with  Diabetic Retinopathy.  In right eye.  This started 3 weeks ago.  Severity is mild.  Since onset it is stable.  I, the attending physician,  performed the HPI with the patient and updated documentation appropriately.          Comments    F/U NPDR OU. Patient states he has occasional floaters os, otherwise  vision is good. BS 209 (11/21/18).        Last edited by Bernarda Caffey, MD on 11/22/2018  9:19 AM. (History)    Patient states he feels like the injection at last visit helped his vision, he states he did not have any problems after the procedure other than burning,   Referring physician: Rory Percy, MD [place],  [place] [address]  HISTORICAL INFORMATION:   Selected notes from the MEDICAL RECORD NUMBER Referred by Dr. Radford Pax for concern of BDR LEE:  Ocular Hx- PMH-anxiety, COPD, gout, HLD, DM (takes metformin, glipizide, januvia)   CURRENT MEDICATIONS: No current outpatient medications on file. (Ophthalmic Drugs)   No current facility-administered medications for this visit.  (Ophthalmic Drugs)   Current Outpatient Medications (Other)  Medication Sig  . albuterol (PROVENTIL HFA;VENTOLIN HFA) 108 (90 Base) MCG/ACT inhaler Inhale 2 puffs into the lungs every 4 (four) hours as needed for wheezing or shortness of breath.  . allopurinol (ZYLOPRIM) 300 MG tablet Take 300 mg by mouth daily.  Marland Kitchen ALPRAZolam (XANAX) 0.5 MG tablet Take 1 tablet by mouth 2 (two) times daily.  Marland Kitchen FLUoxetine (PROZAC) 20 MG capsule Take 20 mg by mouth daily.  Marland Kitchen glipiZIDE (GLUCOTROL XL) 10 MG 24 hr tablet Take 10 mg by mouth 2 (two) times daily.  Marland Kitchen HYDROcodone-acetaminophen (NORCO/VICODIN) 5-325 MG tablet Take 0.5-1 tablets by  mouth 2 (two) times daily. Pt takes 0.5 tablet every morning and 1 tablet at bedtime  . JANUVIA 100 MG tablet Take 1 tablet by mouth daily.  Marland Kitchen lovastatin (MEVACOR) 40 MG tablet Take 40 mg by mouth daily.  . metFORMIN (GLUCOPHAGE) 1000 MG tablet Take 1 tablet by mouth 2 (two) times daily.  . Multiple Minerals (CALCIUM-MAGNESIUM-ZINC) TABS Take 1 tablet by mouth 2 (two) times daily.  . Omega-3 Fatty Acids (FISH OIL) 1000 MG CAPS Take 2 capsules by mouth 2 (two) times daily.  Marland Kitchen omeprazole (PRILOSEC) 20 MG capsule Take 20 mg by mouth daily.  Marland Kitchen terazosin (HYTRIN) 10 MG capsule Take 10 mg by mouth daily.   No current facility-administered medications for this visit.  (Other)      REVIEW OF SYSTEMS: ROS    Positive for: Endocrine, Eyes   Negative for: Constitutional, Gastrointestinal, Neurological, Skin, Genitourinary, Musculoskeletal, HENT, Cardiovascular, Respiratory, Psychiatric, Allergic/Imm, Heme/Lymph   Last edited by Zenovia Jordan, LPN on 0/62/3762  8:31 AM. (History)       ALLERGIES Allergies  Allergen Reactions  . Eggs Or Egg-Derived Products Nausea And Vomiting    PAST MEDICAL HISTORY Past Medical History:  Diagnosis Date  . Anxiety   . COPD (chronic obstructive pulmonary disease) ([place])   . Diabetes mellitus without complication ([place])   . GERD (gastroesophageal reflux disease)   . Gout   .  Hypertension    Past Surgical History:  Procedure Laterality Date  . CATARACT EXTRACTION W/PHACO Left 04/03/2016   Procedure: CATARACT EXTRACTION PHACO AND INTRAOCULAR LENS PLACEMENT LEFT EYE CDE= 11.33;  Surgeon: Tonny Branch, MD;  Location: [facility];  Service: Ophthalmology;  Laterality: Left;  left  . CATARACT EXTRACTION W/PHACO Right 04/28/2016   Procedure: CATARACT EXTRACTION PHACO AND INTRAOCULAR LENS PLACEMENT (IOC);  Surgeon: Tonny Branch, MD;  Location: [facility];  Service: Ophthalmology;  Laterality: Right;  cde-10.23   . KNEE ARTHROSCOPY Right   . NECK SURGERY    . ROTATOR  CUFF REPAIR Right     FAMILY HISTORY Family History  Adopted: Yes    SOCIAL HISTORY Social History   Tobacco Use  . Smoking status: Former Smoker    Types: Cigarettes    Quit date: 03/26/1989    Years since quitting: 29.6  . Smokeless tobacco: Never Used  Substance Use Topics  . Alcohol use: No  . Drug use: No         OPHTHALMIC EXAM:  Base Eye Exam    Visual Acuity (Snellen - Linear)      Right Left   Dist Post 20/25 -1 20/25 +1   Dist ph [place] 20/25 +2 20/25 +2       Tonometry (Tonopen, 8:21 AM)      Right Left   Pressure 12 11       Pupils      Dark Light Shape React APD   Right 3 2 Round Brisk None   Left 3 2 Round Brisk None       Visual Fields (Counting fingers)      Left Right    Full Full       Extraocular Movement      Right Left    Full, Ortho Full, Ortho       Neuro/Psych    Oriented x3: Yes   Mood/Affect: Normal       Dilation    Both eyes: 1.0% Mydriacyl, 2.5% Phenylephrine @ 8:21 AM        Slit Lamp and Fundus Exam    Slit Lamp Exam      Right Left   Lids/Lashes dermatochalasis, mild MGD dermatochalasis, mild MGD   Conjunctiva/Sclera White and quiet White and quiet, temproal pinguecula   Cornea 1+ PEE, tear film debris, well healed temporal catarct wound 1+ PEE, tear film debris, well healed temporal catarct wound   Anterior Chamber deep and clear deep and clear   Iris round and moderately dilated to 5 mm, no NVI round and moderately dilated to 5 mm   Lens PCIOL in good position PCIOL in good position   Vitreous syneresis, Posterior vitreous detachment syneresis       Fundus Exam      Right Left   Disc mild pallor, temporal PPA, tilted disc mild pallor, temp PPA   C/D Ratio 0.4 0.35   Macula Multiple blot hemes superior macula, +MA, good foveal reflex, RPE mottling and clumping flat, blunted foveal reflex, +edema with cluster of MA superonasal to fovea - mild interval improvement, scattered MA elsewhere   Vessels attenuated,  +A/V crossing changes attenuated, +A/V crossing changes   Periphery attached, scattered blot hemes +peripapillary flame hemes nasal to disc Attached, scattered MA; blot hemes superiorly          IMAGING AND PROCEDURES  Imaging and Procedures for @TODAY @  OCT, Retina - OU - Both Eyes       Right  Eye Quality was good. Central Foveal Thickness: 301. Progression has been stable. Findings include normal foveal contour, no SRF, intraretinal fluid (mild cystic changes ST macula, mild ERM).   Left Eye Quality was good. Central Foveal Thickness: 325. Progression has improved. Findings include abnormal foveal contour, no SRF, intraretinal fluid (Interval improvement in foveal profile and IRF).   Notes *Images captured and stored on drive  Diagnosis / Impression:  OD: NFP, no IRF/SRF, mild cystic changes ST macula, no frank DME OS: +DME nasal macula; Interval improvement in foveal profile and IRF    Clinical management:  See below  Abbreviations: NFP - Normal foveal profile. CME - cystoid macular edema. PED - pigment epithelial detachment. IRF - intraretinal fluid. SRF - subretinal fluid. EZ - ellipsoid zone. ERM - epiretinal membrane. ORA - outer retinal atrophy. ORT - outer retinal tubulation. SRHM - subretinal hyper-reflective material        Intravitreal Injection, Pharmacologic Agent - OS - Left Eye       Time Out 11/22/2018. 8:26 AM. Confirmed correct patient, procedure, site, and patient consented.   Anesthesia Topical anesthesia was used. Anesthetic medications included Lidocaine 2%, Proparacaine 0.5%.   Procedure Preparation included 5% betadine to ocular surface, eyelid speculum. A 30 gauge needle was used.   Injection:  1.25 mg Bevacizumab (AVASTIN) SOLN   NDC: 97026-378-58, Lot: [id number] , Expiration date: 01/06/2019   Route: Intravitreal, Site: Left Eye, Waste: 0 mL  Post-op Post injection exam found visual acuity of at least counting fingers. The patient  tolerated the procedure well. There were no complications. The patient received written and verbal post procedure care education.                 ASSESSMENT/PLAN:    ICD-10-CM   1. 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 Retinal edema  H35.81 OCT, Retina - OU - Both Eyes  4. Essential hypertension  I10   5. Hypertensive retinopathy of both eyes  H35.033   6. Pseudophakia of both eyes  Z96.1     1-3. Severe non-proliferative diabetic retinopathy, both eyes  - OD mild cystic changes  - OS +DME  - FA 10/25/18 - late leaking MA OU; no NV;   - s/p IVA OS #1 (07.20.20)  - OCT shows mild cystic changes ST macula, no frank DME OD: OS: interval improvement in foveal profile and IRF   - BCVA 20/25 OU today  - recommend IVA #2 OS today, 08.17.20  - pt wishes to proceed  - RBA of procedure discussed, questions answered  - informed consent obtained and signed  - see procedure note  - f/u in 4 wks  4,5.Hypertensive retinopathy OU  - discussed importance of tight BP control  - monitor  6. Pseudophakia OU  - s/p CE/IOL OU  - beautiful surgeries, doing well  - monitor   Ophthalmic Meds Ordered this visit:  Meds ordered this encounter  Medications  . Bevacizumab (AVASTIN) SOLN 1.25 mg       Return in about 4 weeks (around 12/20/2018) for f/u NPDR OU, DFE, OCT.  There are no Patient Instructions on file for this visit.   Explained the diagnoses, plan, and follow  up with the patient and they expressed understanding.  Patient expressed understanding of the importance of proper follow up care.   This document serves as a record of services  personally performed by Gardiner Sleeper, MD, PhD. It was created on their behalf by Ernest Mallick, OA, an ophthalmic assistant. The creation of this record is the provider's dictation and/or activities during the visit.    Electronically signed by: Ernest Mallick, OA 08.16.2020 9:21 AM     Gardiner Sleeper, M.D., Ph.D. Diseases & Surgery of the Retina and Vitreous [facility]  I have reviewed the above documentation for accuracy and completeness, and I agree with the above. Gardiner Sleeper, M.D., Ph.D. 11/22/18 9:21 AM     Abbreviations: M myopia (nearsighted); A astigmatism; H hyperopia (farsighted); P presbyopia; Mrx spectacle prescription;  CTL contact lenses; OD right eye; OS left eye; OU both eyes  XT exotropia; ET esotropia; PEK punctate epithelial keratitis; PEE punctate epithelial erosions; DES dry eye syndrome; MGD meibomian gland dysfunction; ATs artificial tears; PFAT's preservative free artificial tears; [place] nuclear sclerotic cataract; PSC posterior subcapsular cataract; ERM epi-retinal membrane; PVD posterior vitreous detachment; RD retinal detachment; DM diabetes mellitus; DR diabetic retinopathy; NPDR non-proliferative diabetic retinopathy; PDR proliferative diabetic retinopathy; CSME clinically significant macular edema; DME diabetic macular edema; dbh dot blot hemorrhages; CWS cotton wool spot; POAG primary open angle glaucoma; C/D cup-to-disc ratio; HVF humphrey visual field; GVF goldmann visual field; OCT optical coherence tomography; IOP intraocular pressure; BRVO Branch retinal vein occlusion; CRVO central retinal vein occlusion; CRAO central retinal artery occlusion; BRAO branch retinal artery occlusion; RT retinal tear; SB scleral buckle; PPV pars plana vitrectomy; VH Vitreous hemorrhage; PRP panretinal laser photocoagulation; IVK intravitreal kenalog; VMT vitreomacular traction; MH Macular hole;  NVD neovascularization of the disc; NVE neovascularization  elsewhere; AREDS age related eye disease study; ARMD age related macular degeneration; POAG primary open angle glaucoma; EBMD epithelial/anterior basement membrane dystrophy; ACIOL anterior chamber intraocular lens; IOL intraocular lens; PCIOL posterior chamber intraocular lens; Phaco/IOL phacoemulsification with intraocular lens placement; [place] photorefractive keratectomy; LASIK laser assisted in situ keratomileusis; HTN hypertension; DM diabetes mellitus; COPD chronic obstructive pulmonary disease

## 2018-11-22 ENCOUNTER — Other Ambulatory Visit: Payer: Self-pay

## 2018-11-22 ENCOUNTER — Ambulatory Visit (INDEPENDENT_AMBULATORY_CARE_PROVIDER_SITE_OTHER): Payer: Medicare Other | Admitting: Ophthalmology

## 2018-11-22 ENCOUNTER — Encounter (INDEPENDENT_AMBULATORY_CARE_PROVIDER_SITE_OTHER): Payer: Self-pay | Admitting: Ophthalmology

## 2018-11-22 DIAGNOSIS — I10 Essential (primary) hypertension: Secondary | ICD-10-CM | POA: Diagnosis not present

## 2018-11-22 DIAGNOSIS — H35033 Hypertensive retinopathy, bilateral: Secondary | ICD-10-CM | POA: Diagnosis not present

## 2018-11-22 DIAGNOSIS — H3581 Retinal edema: Secondary | ICD-10-CM | POA: Diagnosis not present

## 2018-11-22 DIAGNOSIS — E113412 Type 2 diabetes mellitus with severe nonproliferative diabetic retinopathy with macular edema, left eye: Secondary | ICD-10-CM

## 2018-11-22 DIAGNOSIS — E113491 Type 2 diabetes mellitus with severe nonproliferative diabetic retinopathy without macular edema, right eye: Secondary | ICD-10-CM | POA: Diagnosis not present

## 2018-11-22 DIAGNOSIS — Z961 Presence of intraocular lens: Secondary | ICD-10-CM

## 2018-11-22 MED ORDER — BEVACIZUMAB CHEMO INJECTION 1.25MG/0.05ML SYRINGE FOR KALEIDOSCOPE
1.2500 mg | INTRAVITREAL | Status: AC | PRN
  Administered 2018-11-22: 1.25 mg via INTRAVITREAL

## 2018-11-23 DIAGNOSIS — F419 Anxiety disorder, unspecified: Secondary | ICD-10-CM | POA: Diagnosis not present

## 2018-11-23 DIAGNOSIS — F329 Major depressive disorder, single episode, unspecified: Secondary | ICD-10-CM | POA: Diagnosis not present

## 2018-11-23 DIAGNOSIS — E11649 Type 2 diabetes mellitus with hypoglycemia without coma: Secondary | ICD-10-CM | POA: Diagnosis not present

## 2018-11-23 DIAGNOSIS — M4302 Spondylolysis, cervical region: Secondary | ICD-10-CM | POA: Diagnosis not present

## 2018-11-23 DIAGNOSIS — M50323 Other cervical disc degeneration at C6-C7 level: Secondary | ICD-10-CM | POA: Diagnosis not present

## 2018-11-23 DIAGNOSIS — M47812 Spondylosis without myelopathy or radiculopathy, cervical region: Secondary | ICD-10-CM | POA: Diagnosis not present

## 2018-11-23 DIAGNOSIS — Z6832 Body mass index (BMI) 32.0-32.9, adult: Secondary | ICD-10-CM | POA: Diagnosis not present

## 2018-11-23 DIAGNOSIS — Z299 Encounter for prophylactic measures, unspecified: Secondary | ICD-10-CM | POA: Diagnosis not present

## 2018-11-23 DIAGNOSIS — Z981 Arthrodesis status: Secondary | ICD-10-CM | POA: Diagnosis not present

## 2018-11-30 DIAGNOSIS — H6690 Otitis media, unspecified, unspecified ear: Secondary | ICD-10-CM | POA: Diagnosis not present

## 2018-11-30 DIAGNOSIS — E11649 Type 2 diabetes mellitus with hypoglycemia without coma: Secondary | ICD-10-CM | POA: Diagnosis not present

## 2018-11-30 DIAGNOSIS — Z299 Encounter for prophylactic measures, unspecified: Secondary | ICD-10-CM | POA: Diagnosis not present

## 2018-11-30 DIAGNOSIS — I10 Essential (primary) hypertension: Secondary | ICD-10-CM | POA: Diagnosis not present

## 2018-11-30 DIAGNOSIS — H6980 Other specified disorders of Eustachian tube, unspecified ear: Secondary | ICD-10-CM | POA: Diagnosis not present

## 2018-11-30 DIAGNOSIS — F419 Anxiety disorder, unspecified: Secondary | ICD-10-CM | POA: Diagnosis not present

## 2018-11-30 DIAGNOSIS — Z6831 Body mass index (BMI) 31.0-31.9, adult: Secondary | ICD-10-CM | POA: Diagnosis not present

## 2018-12-19 NOTE — Progress Notes (Signed)
[facility] Note  12/20/2018     CHIEF COMPLAINT Patient presents for Retina Follow Up   HISTORY OF PRESENT ILLNESS: Kurt Duran is a 73 y.o. male who presents to the clinic today for:   HPI    Retina Follow Up    Patient presents with  Diabetic Retinopathy.  In both eyes.  This started weeks ago.  Severity is moderate.  Duration of weeks.  Since onset it is stable.  I, the attending physician,  performed the HPI with the patient and updated documentation appropriately.          Comments    BS: 196 this morning Patient states his vision is still very good.  Patient denies eye pain or discomfort and denies any new or worsening floaters or fol OU.       Last edited by Bernarda Caffey, MD on 12/20/2018  8:26 AM. (History)    Patient states he has not had any problems with his eyes, no problems receiving injections  Referring physician: No referring provider defined for this encounter.  HISTORICAL INFORMATION:   Selected notes from the MEDICAL RECORD NUMBER Referred by Dr. Radford Pax for concern of BDR LEE:  Ocular Hx- PMH-anxiety, COPD, gout, HLD, DM (takes metformin, glipizide, januvia)   CURRENT MEDICATIONS: No current outpatient medications on file. (Ophthalmic Drugs)   No current facility-administered medications for this visit.  (Ophthalmic Drugs)   Current Outpatient Medications (Other)  Medication Sig  . albuterol (PROVENTIL HFA;VENTOLIN HFA) 108 (90 Base) MCG/ACT inhaler Inhale 2 puffs into the lungs every 4 (four) hours as needed for wheezing or shortness of breath.  . allopurinol (ZYLOPRIM) 300 MG tablet Take 300 mg by mouth daily.  Marland Kitchen ALPRAZolam (XANAX) 0.5 MG tablet Take 1 tablet by mouth 2 (two) times daily.  Marland Kitchen FLUoxetine (PROZAC) 20 MG capsule Take 20 mg by mouth daily.  Marland Kitchen glipiZIDE (GLUCOTROL XL) 10 MG 24 hr tablet Take 10 mg by mouth 2 (two) times daily.  Marland Kitchen HYDROcodone-acetaminophen (NORCO/VICODIN) 5-325 MG tablet Take 0.5-1  tablets by mouth 2 (two) times daily. Pt takes 0.5 tablet every morning and 1 tablet at bedtime  . JANUVIA 100 MG tablet Take 1 tablet by mouth daily.  Marland Kitchen lovastatin (MEVACOR) 40 MG tablet Take 40 mg by mouth daily.  . metFORMIN (GLUCOPHAGE) 1000 MG tablet Take 1 tablet by mouth 2 (two) times daily.  . Multiple Minerals (CALCIUM-MAGNESIUM-ZINC) TABS Take 1 tablet by mouth 2 (two) times daily.  . Omega-3 Fatty Acids (FISH OIL) 1000 MG CAPS Take 2 capsules by mouth 2 (two) times daily.  Marland Kitchen omeprazole (PRILOSEC) 20 MG capsule Take 20 mg by mouth daily.  Marland Kitchen terazosin (HYTRIN) 10 MG capsule Take 10 mg by mouth daily.   No current facility-administered medications for this visit.  (Other)      REVIEW OF SYSTEMS: ROS    Positive for: Endocrine, Eyes   Negative for: Constitutional, Gastrointestinal, Neurological, Skin, Genitourinary, Musculoskeletal, HENT, Cardiovascular, Respiratory, Psychiatric, Allergic/Imm, Heme/Lymph   Last edited by Doneen Poisson on 12/20/2018  8:12 AM. (History)       ALLERGIES Allergies  Allergen Reactions  . Eggs Or Egg-Derived Products Nausea And Vomiting    PAST MEDICAL HISTORY Past Medical History:  Diagnosis Date  . Anxiety   . COPD (chronic obstructive pulmonary disease) ([place])   . Diabetes mellitus without complication ([place])   . GERD (gastroesophageal reflux disease)   . Gout   . Hypertension  Past Surgical History:  Procedure Laterality Date  . CATARACT EXTRACTION W/PHACO Left 04/03/2016   Procedure: CATARACT EXTRACTION PHACO AND INTRAOCULAR LENS PLACEMENT LEFT EYE CDE= 11.33;  Surgeon: Tonny Branch, MD;  Location: [facility];  Service: Ophthalmology;  Laterality: Left;  left  . CATARACT EXTRACTION W/PHACO Right 04/28/2016   Procedure: CATARACT EXTRACTION PHACO AND INTRAOCULAR LENS PLACEMENT (IOC);  Surgeon: Tonny Branch, MD;  Location: [facility];  Service: Ophthalmology;  Laterality: Right;  [id number]   . KNEE ARTHROSCOPY Right   . NECK SURGERY    .  ROTATOR CUFF REPAIR Right     FAMILY HISTORY Family History  Adopted: Yes    SOCIAL HISTORY Social History   Tobacco Use  . Smoking status: Former Smoker    Types: Cigarettes    Quit date: 03/26/1989    Years since quitting: 29.7  . Smokeless tobacco: Never Used  Substance Use Topics  . Alcohol use: No  . Drug use: No         OPHTHALMIC EXAM:  Base Eye Exam    Visual Acuity (Snellen - Linear)      Right Left   Dist [place] 20/25 +1 20/20 -2   Dist ph [place] 20/20 -2    Correction: Glasses       Tonometry (Tonopen, 8:14 AM)      Right Left   Pressure 17 17       Pupils      Dark Light Shape React APD   Right 3 2 Round Brisk 0   Left 3 2 Round Brisk 0       Visual Fields      Left Right    Full Full       Extraocular Movement      Right Left    Full Full       Neuro/Psych    Oriented x3: Yes   Mood/Affect: Normal       Dilation    Both eyes: 1.0% Mydriacyl, 2.5% Phenylephrine @ 8:15 AM        Slit Lamp and Fundus Exam    Slit Lamp Exam      Right Left   Lids/Lashes dermatochalasis, mild MGD dermatochalasis, mild MGD   Conjunctiva/Sclera White and quiet White and quiet, temproal pinguecula   Cornea 1+ PEE, tear film debris, well healed temporal catarct wound 1+ PEE, tear film debris, well healed temporal catarct wound   Anterior Chamber deep and clear deep and clear   Iris round and moderately dilated to 5 mm, no NVI round and poorly dilated to 4.5 mm   Lens PCIOL in good position PCIOL in good position   Vitreous syneresis, Posterior vitreous detachment syneresis       Fundus Exam      Right Left   Disc mild pallor, temporal PPA, tilted disc mild pallor, mild temp PPA   C/D Ratio 0.6 0.35   Macula Multiple blot hemes superior macula, +MA, good foveal reflex, RPE mottling and clumping flat, blunted foveal reflex, +edema with cluster of MA superonasal to fovea - mild interval improvement, scattered MA elsewhere   Vessels attenuated, +A/V crossing  changes, Tortuous attenuated, +A/V crossing changes, Tortuous   Periphery attached, scattered blot hemes +peripapillary flame hemes nasal to disc Attached, scattered MA; blot hemes superiorly          IMAGING AND PROCEDURES  Imaging and Procedures for @TODAY @  OCT, Retina - OU - Both Eyes       Right Eye Quality  was good. Central Foveal Thickness: 305. Progression has been stable. Findings include normal foveal contour, no SRF, intraretinal fluid (mild cystic changes ST macula, mild ERM).   Left Eye Quality was good. Central Foveal Thickness: 321. Progression has improved. Findings include abnormal foveal contour, no SRF, intraretinal fluid (Interval improvement in foveal profile and IRF).   Notes *Images captured and stored on drive  Diagnosis / Impression:  OD: NFP, no IRF/SRF, mild cystic changes ST macula, no frank DME OS: +DME nasal macula; Mild interval improvement in foveal profile and IRF    Clinical management:  See below  Abbreviations: NFP - Normal foveal profile. CME - cystoid macular edema. PED - pigment epithelial detachment. IRF - intraretinal fluid. SRF - subretinal fluid. EZ - ellipsoid zone. ERM - epiretinal membrane. ORA - outer retinal atrophy. ORT - outer retinal tubulation. SRHM - subretinal hyper-reflective material        Intravitreal Injection, Pharmacologic Agent - OS - Left Eye       Time Out 12/20/2018. 8:11 AM. Confirmed correct patient, procedure, site, and patient consented.   Anesthesia Topical anesthesia was used. Anesthetic medications included Lidocaine 2%, Proparacaine 0.5%.   Procedure Preparation included 5% betadine to ocular surface, eyelid speculum. A 30 gauge needle was used.   Injection:  1.25 mg Bevacizumab (AVASTIN) SOLN   NDC: [id number], Lot: [id number] , Expiration date: 02/15/2019   Route: Intravitreal, Site: Left Eye, Waste: 0 mL  Post-op Post injection exam found visual acuity of at least counting fingers.  The patient tolerated the procedure well. There were no complications. The patient received written and verbal post procedure care education.                 ASSESSMENT/PLAN:    ICD-10-CM   1. 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 Retinal edema  H35.81 OCT, Retina - OU - Both Eyes  4. Essential hypertension  I10   5. Hypertensive retinopathy of both eyes  H35.033   6. Pseudophakia of both eyes  Z96.1     1-3. Severe non-proliferative diabetic retinopathy, both eyes  - OD mild cystic changes  - OS +DME  - FA 10/25/18 - late leaking MA OU; no NV;   - s/p IVA OS #1 (07.20.20), #2 (08.17.20)  - OCT shows mild cystic changes ST macula, no frank DME OD: OS: persistent IRF   - BCVA 20/20 OU today  - recommend IVA #3 OS today, 09.14.20  - pt wishes to proceed  - RBA of procedure discussed, questions answered  - informed consent obtained and signed  - see procedure note  - f/u in 4 wks, DFE, OCT, possible focal laser vs injection?  4,5.Hypertensive retinopathy OU  - discussed importance of tight BP control  - monitor  6. Pseudophakia OU  - s/p CE/IOL OU  - beautiful surgeries, doing well  - monitor   Ophthalmic Meds Ordered this visit:  Meds ordered this encounter  Medications  . Bevacizumab (AVASTIN) SOLN 1.25 mg       Return in about 4 weeks (around 01/17/2019) for f/u NPDR OU, DFE, OCT.  There are no Patient Instructions on file for this visit.   Explained the diagnoses,  plan, and follow up with the patient and they expressed understanding.  Patient expressed understanding of the importance of proper follow up care.   .This  document serves as a record of services personally performed by Gardiner Sleeper, MD, PhD. It was created on their behalf by Ernest Mallick, OA, an ophthalmic assistant. The creation of this record is the provider's dictation and/or activities during the visit.    Electronically signed by: Ernest Mallick, OA 09.13.2020 9:14 PM    Gardiner Sleeper, M.D., Ph.D. Diseases & Surgery of the Retina and Vitreous [facility]  I have reviewed the above documentation for accuracy and completeness, and I agree with the above. Gardiner Sleeper, M.D., Ph.D. 12/20/18 9:15 PM    Abbreviations: M myopia (nearsighted); A astigmatism; H hyperopia (farsighted); P presbyopia; Mrx spectacle prescription;  CTL contact lenses; OD right eye; OS left eye; OU both eyes  XT exotropia; ET esotropia; PEK punctate epithelial keratitis; PEE punctate epithelial erosions; DES dry eye syndrome; MGD meibomian gland dysfunction; ATs artificial tears; PFAT's preservative free artificial tears; [place] nuclear sclerotic cataract; PSC posterior subcapsular cataract; ERM epi-retinal membrane; PVD posterior vitreous detachment; RD retinal detachment; DM diabetes mellitus; DR diabetic retinopathy; NPDR non-proliferative diabetic retinopathy; PDR proliferative diabetic retinopathy; CSME clinically significant macular edema; DME diabetic macular edema; dbh dot blot hemorrhages; CWS cotton wool spot; POAG primary open angle glaucoma; C/D cup-to-disc ratio; HVF humphrey visual field; GVF goldmann visual field; OCT optical coherence tomography; IOP intraocular pressure; BRVO Branch retinal vein occlusion; CRVO central retinal vein occlusion; CRAO central retinal artery occlusion; BRAO branch retinal artery occlusion; RT retinal tear; SB scleral buckle; PPV pars plana vitrectomy; VH Vitreous hemorrhage; PRP panretinal laser photocoagulation; IVK intravitreal kenalog; VMT vitreomacular traction; MH Macular hole;  NVD neovascularization of  the disc; NVE neovascularization elsewhere; AREDS age related eye disease study; ARMD age related macular degeneration; POAG primary open angle glaucoma; EBMD epithelial/anterior basement membrane dystrophy; ACIOL anterior chamber intraocular lens; IOL intraocular lens; PCIOL posterior chamber intraocular lens; Phaco/IOL phacoemulsification with intraocular lens placement; [place] photorefractive keratectomy; LASIK laser assisted in situ keratomileusis; HTN hypertension; DM diabetes mellitus; COPD chronic obstructive pulmonary disease

## 2018-12-20 ENCOUNTER — Ambulatory Visit (INDEPENDENT_AMBULATORY_CARE_PROVIDER_SITE_OTHER): Payer: Medicare Other | Admitting: Ophthalmology

## 2018-12-20 ENCOUNTER — Encounter (INDEPENDENT_AMBULATORY_CARE_PROVIDER_SITE_OTHER): Payer: Self-pay | Admitting: Ophthalmology

## 2018-12-20 DIAGNOSIS — E113412 Type 2 diabetes mellitus with severe nonproliferative diabetic retinopathy with macular edema, left eye: Secondary | ICD-10-CM

## 2018-12-20 DIAGNOSIS — H3581 Retinal edema: Secondary | ICD-10-CM | POA: Diagnosis not present

## 2018-12-20 DIAGNOSIS — I10 Essential (primary) hypertension: Secondary | ICD-10-CM

## 2018-12-20 DIAGNOSIS — E113491 Type 2 diabetes mellitus with severe nonproliferative diabetic retinopathy without macular edema, right eye: Secondary | ICD-10-CM

## 2018-12-20 DIAGNOSIS — Z961 Presence of intraocular lens: Secondary | ICD-10-CM

## 2018-12-20 DIAGNOSIS — H35033 Hypertensive retinopathy, bilateral: Secondary | ICD-10-CM

## 2018-12-20 MED ORDER — BEVACIZUMAB CHEMO INJECTION 1.25MG/0.05ML SYRINGE FOR KALEIDOSCOPE
1.2500 mg | INTRAVITREAL | Status: AC | PRN
  Administered 2018-12-20: 1.25 mg via INTRAVITREAL

## 2019-01-10 ENCOUNTER — Other Ambulatory Visit: Payer: Self-pay

## 2019-01-10 ENCOUNTER — Ambulatory Visit (INDEPENDENT_AMBULATORY_CARE_PROVIDER_SITE_OTHER): Payer: Medicare Other | Admitting: Otolaryngology

## 2019-01-10 DIAGNOSIS — H6121 Impacted cerumen, right ear: Secondary | ICD-10-CM

## 2019-01-10 DIAGNOSIS — H903 Sensorineural hearing loss, bilateral: Secondary | ICD-10-CM

## 2019-01-11 NOTE — Progress Notes (Signed)
[facility] Note  01/17/2019     CHIEF COMPLAINT Patient presents for Retina Follow Up   HISTORY OF PRESENT ILLNESS: Kurt Duran is a 73 y.o. male who presents to the clinic today for:   HPI    Retina Follow Up    Patient presents with  Diabetic Retinopathy.  In both eyes.  This started weeks ago.  Severity is moderate.  Duration of weeks.  Since onset it is stable.  I, the attending physician,  performed the HPI with the patient and updated documentation appropriately.          Comments    BS: 164 yesterday Patient states his vision is about the same OU.  Patient denies eye pain or discomfort and denies any new or worsening floaters or fol OU.       Last edited by Bernarda Caffey, MD on 01/17/2019  8:57 AM. (History)    Patient states  Referring physician: Particia Nearing, [place] [address],  [place] [address]  HISTORICAL INFORMATION:   Selected notes from the MEDICAL RECORD NUMBER Referred by Dr. Radford Pax for concern of BDR LEE:  Ocular Hx- PMH-anxiety, COPD, gout, HLD, DM (takes metformin, glipizide, januvia)   CURRENT MEDICATIONS: Current Outpatient Medications (Ophthalmic Drugs)  Medication Sig  . prednisoLONE acetate (PRED FORTE) 1 % ophthalmic suspension Place 1 drop into the left eye 4 (four) times daily for 7 days.   No current facility-administered medications for this visit.  (Ophthalmic Drugs)   Current Outpatient Medications (Other)  Medication Sig  . albuterol (PROVENTIL HFA;VENTOLIN HFA) 108 (90 Base) MCG/ACT inhaler Inhale 2 puffs into the lungs every 4 (four) hours as needed for wheezing or shortness of breath.  . allopurinol (ZYLOPRIM) 300 MG tablet Take 300 mg by mouth daily.  Marland Kitchen ALPRAZolam (XANAX) 0.5 MG tablet Take 1 tablet by mouth 2 (two) times daily.  Marland Kitchen FLUoxetine (PROZAC) 20 MG capsule Take 20 mg by mouth daily.  Marland Kitchen glipiZIDE (GLUCOTROL XL) 10 MG 24 hr tablet Take 10 mg by mouth 2 (two) times daily.   Marland Kitchen HYDROcodone-acetaminophen (NORCO/VICODIN) 5-325 MG tablet Take 0.5-1 tablets by mouth 2 (two) times daily. Pt takes 0.5 tablet every morning and 1 tablet at bedtime  . JANUVIA 100 MG tablet Take 1 tablet by mouth daily.  Marland Kitchen lovastatin (MEVACOR) 40 MG tablet Take 40 mg by mouth daily.  . metFORMIN (GLUCOPHAGE) 1000 MG tablet Take 1 tablet by mouth 2 (two) times daily.  . Multiple Minerals (CALCIUM-MAGNESIUM-ZINC) TABS Take 1 tablet by mouth 2 (two) times daily.  . Omega-3 Fatty Acids (FISH OIL) 1000 MG CAPS Take 2 capsules by mouth 2 (two) times daily.  Marland Kitchen omeprazole (PRILOSEC) 20 MG capsule Take 20 mg by mouth daily.  Marland Kitchen terazosin (HYTRIN) 10 MG capsule Take 10 mg by mouth daily.   No current facility-administered medications for this visit.  (Other)      REVIEW OF SYSTEMS: ROS    Positive for: Endocrine, Eyes   Negative for: Constitutional, Gastrointestinal, Neurological, Skin, Genitourinary, Musculoskeletal, HENT, Cardiovascular, Respiratory, Psychiatric, Allergic/Imm, Heme/Lymph   Last edited by Doneen Poisson on 01/17/2019  8:48 AM. (History)       ALLERGIES Allergies  Allergen Reactions  . Eggs Or Egg-Derived Products Nausea And Vomiting    PAST MEDICAL HISTORY Past Medical History:  Diagnosis Date  . Anxiety   . COPD (chronic obstructive pulmonary disease) ([place])   . Diabetes mellitus without complication ([place])   .  GERD (gastroesophageal reflux disease)   . Gout   . Hypertension    Past Surgical History:  Procedure Laterality Date  . CATARACT EXTRACTION W/PHACO Left 04/03/2016   Procedure: CATARACT EXTRACTION PHACO AND INTRAOCULAR LENS PLACEMENT LEFT EYE CDE= 11.33;  Surgeon: Tonny Branch, MD;  Location: [facility];  Service: Ophthalmology;  Laterality: Left;  left  . CATARACT EXTRACTION W/PHACO Right 04/28/2016   Procedure: CATARACT EXTRACTION PHACO AND INTRAOCULAR LENS PLACEMENT (IOC);  Surgeon: Tonny Branch, MD;  Location: [facility];  Service: Ophthalmology;  Laterality:  Right;  [id number]   . KNEE ARTHROSCOPY Right   . NECK SURGERY    . ROTATOR CUFF REPAIR Right     FAMILY HISTORY Family History  Adopted: Yes    SOCIAL HISTORY Social History   Tobacco Use  . Smoking status: Former Smoker    Types: Cigarettes    Quit date: 03/26/1989    Years since quitting: 29.8  . Smokeless tobacco: Never Used  Substance Use Topics  . Alcohol use: No  . Drug use: No         OPHTHALMIC EXAM:  Base Eye Exam    Visual Acuity (Snellen - Linear)      Right Left   Dist [place] 20/20 -2 20/20 -2       Tonometry (Tonopen, 8:51 AM)      Right Left   Pressure 17 16       Pupils      Dark Light Shape React APD   Right 2 1 Round Minimal 0   Left 2 1 Round Minimal 0       Visual Fields      Left Right    Full Full       Extraocular Movement      Right Left    Full Full       Neuro/Psych    Oriented x3: Yes   Mood/Affect: Normal       Dilation    Both eyes: 1.0% Mydriacyl, 2.5% Phenylephrine @ 8:51 AM        Slit Lamp and Fundus Exam    Slit Lamp Exam      Right Left   Lids/Lashes dermatochalasis, mild MGD dermatochalasis, mild MGD   Conjunctiva/Sclera White and quiet White and quiet, temproal pinguecula   Cornea 1+ PEE, tear film debris, well healed temporal catarct wound 1+ PEE, tear film debris, well healed temporal catarct wound   Anterior Chamber deep and clear deep and clear   Iris round and moderately dilated to 5 mm, no NVI round and poorly dilated to 4.5 mm   Lens PCIOL in good position PCIOL in good position   Vitreous syneresis, Posterior vitreous detachment syneresis       Fundus Exam      Right Left   Disc mild pallor, temporal PPA, tilted disc mild pallor, mild temp PPA   C/D Ratio 0.6 0.5   Macula Multiple blot hemes superior macula, +cluster of MA superior and ST to fovea, good foveal reflex, RPE mottling and clumping flat, blunted foveal reflex, +focal cluster of MA superonasal to fovea - mild interval improvement,  scattered MA elsewhere   Vessels attenuated, +A/V crossing changes, Tortuous attenuated, +A/V crossing changes, Tortuous   Periphery attached, scattered blot hemes +peripapillary flame hemes nasal to disc Attached, scattered MA; blot hemes superiorly          IMAGING AND PROCEDURES  Imaging and Procedures for @TODAY @  OCT, Retina - OU - Both Eyes  Right Eye Quality was good. Central Foveal Thickness: 319. Progression has been stable. Findings include normal foveal contour, no SRF, intraretinal fluid (Persistent IRF).   Left Eye Quality was good. Central Foveal Thickness: 317. Progression has improved. Findings include abnormal foveal contour, no SRF, intraretinal fluid (Persistent IRF -- slightly improved from prior).   Notes *Images captured and stored on drive  Diagnosis / Impression:  OD: NFP, no IRF/SRF, mild cystic changes ST macula, no frank DME - Persistent IRF OS: +DME nasal macula; Persistent IRF -- slightly improved from prior   Clinical management:  See below  Abbreviations: NFP - Normal foveal profile. CME - cystoid macular edema. PED - pigment epithelial detachment. IRF - intraretinal fluid. SRF - subretinal fluid. EZ - ellipsoid zone. ERM - epiretinal membrane. ORA - outer retinal atrophy. ORT - outer retinal tubulation. SRHM - subretinal hyper-reflective material        Focal Laser - OS - Left Eye       LASER PROCEDURE NOTE  Diagnosis:   Diabetic macular edema, left eye  Procedure:  Focal laser photocoagulation using slit lamp laser, left eye  Anesthesia:  Topical  Surgeon: Bernarda Caffey, MD, PhD   Informed consent obtained, operative eye marked, and time out performed prior to initiation of laser.   [place] [id number] Focal/Grid laser Power: 95 mW Duration: 40 msec  Spot size: 125 microns  # spots: 61 spots placed to cluster of MAs superonasal to fovea and scattered in temporal macula.  Complications: None.  RTC: 4 wks  Patient  tolerated the procedure well and received written and verbal post-procedure care information/education.                   ASSESSMENT/PLAN:    ICD-10-CM   1. 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 Retinal edema  H35.81 OCT, Retina - OU - Both Eyes  4. Essential hypertension  I10   5. Hypertensive retinopathy of both eyes  H35.033   6. Pseudophakia of both eyes  Z96.1     1-3. Severe non-proliferative diabetic retinopathy, both eyes  - OD mild cystic changes  - OS +DME  - FA 10/25/18 - late leaking MA OU; no NV  - s/p IVA OS #1 (07.20.20), #2 (08.17.20), #3 (09.14.20)  - OCT shows persistent IRF OU; OS slightly improved from prior  - BCVA 20/20 OU today  - exam shows clusters of MA amenable to focal laser  - recommend focal laser OU, OS first today, 10.12.20  - pt wishes to proceed  - RBA of procedure discussed, questions answered  - informed consent obtained and signed  - see procedure note  - start PF qid OS x7 days  - f/u in 4 wks, DFE, OCT, focal laser OD  4,5.Hypertensive retinopathy OU  - discussed importance of tight BP control  - monitor  6. Pseudophakia OU  - s/p CE/IOL OU  - beautiful surgeries, doing well  - monitor   Ophthalmic Meds Ordered this visit:  Meds ordered this encounter  Medications  . prednisoLONE acetate (PRED FORTE) 1 % ophthalmic suspension    Sig: Place 1 drop into the left eye 4 (four) times daily for 7 days.  Dispense:  10 mL    Refill:  0       Return in about 4 weeks (around 02/14/2019) for f/u 4 weeks, NPDR OU, Dilated Exam, OCT.  There are no Patient Instructions on file for this  visit.   Explained the diagnoses, plan, and follow up with the patient and they expressed understanding.  Patient expressed understanding of the importance of proper follow up care.    Electronically signed by: Leeann Must, [place] 01/11/2019 10:09AM  This document serves as a record of services personally performed by Gardiner Sleeper, MD, PhD. It was created on their behalf by Ernest Mallick, OA, an ophthalmic assistant. The creation of this record is the provider's dictation and/or activities during the visit.    Electronically signed by: Ernest Mallick, OA 10.12.2020 9:52 AM    Gardiner Sleeper, M.D., Ph.D. Diseases & Surgery of the Retina and Vitreous [facility]  I have reviewed the above documentation for accuracy and completeness, and I agree with the above. Gardiner Sleeper, M.D., Ph.D. 01/17/19 9:53 AM   Abbreviations: M myopia (nearsighted); A astigmatism; H hyperopia (farsighted); P presbyopia; Mrx spectacle prescription;  CTL contact lenses; OD right eye; OS left eye; OU both eyes  XT exotropia; ET esotropia; PEK punctate epithelial keratitis; PEE punctate epithelial erosions; DES dry eye syndrome; MGD meibomian gland dysfunction; ATs artificial tears; PFAT's preservative free artificial tears; [place] nuclear sclerotic cataract; PSC posterior subcapsular cataract; ERM epi-retinal membrane; PVD posterior vitreous detachment; RD retinal detachment; DM diabetes mellitus; DR diabetic retinopathy; NPDR non-proliferative diabetic retinopathy; PDR proliferative diabetic retinopathy; CSME clinically significant macular edema; DME diabetic macular edema; dbh dot blot hemorrhages; CWS cotton wool spot; POAG primary open angle glaucoma; C/D cup-to-disc ratio; HVF humphrey visual field; GVF goldmann visual field; OCT optical coherence tomography; IOP intraocular pressure; BRVO Branch retinal vein occlusion; CRVO central retinal vein occlusion; CRAO central retinal artery occlusion;  BRAO branch retinal artery occlusion; RT retinal tear; SB scleral buckle; PPV pars plana vitrectomy; VH Vitreous hemorrhage; PRP panretinal laser photocoagulation; IVK intravitreal kenalog; VMT vitreomacular traction; MH Macular hole;  NVD neovascularization of the disc; NVE neovascularization elsewhere; AREDS age related eye disease study; ARMD age related macular degeneration; POAG primary open angle glaucoma; EBMD epithelial/anterior basement membrane dystrophy; ACIOL anterior chamber intraocular lens; IOL intraocular lens; PCIOL posterior chamber intraocular lens; Phaco/IOL phacoemulsification with intraocular lens placement; [place] photorefractive keratectomy; LASIK laser assisted in situ keratomileusis; HTN hypertension; DM diabetes mellitus; COPD chronic obstructive pulmonary disease

## 2019-01-17 ENCOUNTER — Encounter (INDEPENDENT_AMBULATORY_CARE_PROVIDER_SITE_OTHER): Payer: Self-pay | Admitting: Ophthalmology

## 2019-01-17 ENCOUNTER — Ambulatory Visit (INDEPENDENT_AMBULATORY_CARE_PROVIDER_SITE_OTHER): Payer: Medicare Other | Admitting: Ophthalmology

## 2019-01-17 DIAGNOSIS — I10 Essential (primary) hypertension: Secondary | ICD-10-CM | POA: Diagnosis not present

## 2019-01-17 DIAGNOSIS — H3581 Retinal edema: Secondary | ICD-10-CM | POA: Diagnosis not present

## 2019-01-17 DIAGNOSIS — E113412 Type 2 diabetes mellitus with severe nonproliferative diabetic retinopathy with macular edema, left eye: Secondary | ICD-10-CM | POA: Diagnosis not present

## 2019-01-17 DIAGNOSIS — H35033 Hypertensive retinopathy, bilateral: Secondary | ICD-10-CM | POA: Diagnosis not present

## 2019-01-17 DIAGNOSIS — Z961 Presence of intraocular lens: Secondary | ICD-10-CM | POA: Diagnosis not present

## 2019-01-17 DIAGNOSIS — E113491 Type 2 diabetes mellitus with severe nonproliferative diabetic retinopathy without macular edema, right eye: Secondary | ICD-10-CM | POA: Diagnosis not present

## 2019-01-17 MED ORDER — PREDNISOLONE ACETATE 1 % OP SUSP: 1.0000 [drp] | Freq: Four times a day (QID) | OPHTHALMIC | 0 refills | Status: AC

## 2019-02-07 DIAGNOSIS — Z2821 Immunization not carried out because of patient refusal: Secondary | ICD-10-CM | POA: Diagnosis not present

## 2019-02-07 DIAGNOSIS — Z1211 Encounter for screening for malignant neoplasm of colon: Secondary | ICD-10-CM | POA: Diagnosis not present

## 2019-02-07 DIAGNOSIS — I10 Essential (primary) hypertension: Secondary | ICD-10-CM | POA: Diagnosis not present

## 2019-02-07 DIAGNOSIS — Z125 Encounter for screening for malignant neoplasm of prostate: Secondary | ICD-10-CM | POA: Diagnosis not present

## 2019-02-07 DIAGNOSIS — Z7189 Other specified counseling: Secondary | ICD-10-CM | POA: Diagnosis not present

## 2019-02-07 DIAGNOSIS — Z0000 Encounter for general adult medical examination without abnormal findings: Secondary | ICD-10-CM | POA: Diagnosis not present

## 2019-02-07 DIAGNOSIS — Z299 Encounter for prophylactic measures, unspecified: Secondary | ICD-10-CM | POA: Diagnosis not present

## 2019-02-07 DIAGNOSIS — E7800 Pure hypercholesterolemia, unspecified: Secondary | ICD-10-CM | POA: Diagnosis not present

## 2019-02-07 DIAGNOSIS — Z87891 Personal history of nicotine dependence: Secondary | ICD-10-CM | POA: Diagnosis not present

## 2019-02-07 DIAGNOSIS — Z1331 Encounter for screening for depression: Secondary | ICD-10-CM | POA: Diagnosis not present

## 2019-02-07 DIAGNOSIS — Z1339 Encounter for screening examination for other mental health and behavioral disorders: Secondary | ICD-10-CM | POA: Diagnosis not present

## 2019-02-07 DIAGNOSIS — F419 Anxiety disorder, unspecified: Secondary | ICD-10-CM | POA: Diagnosis not present

## 2019-02-07 DIAGNOSIS — Z79899 Other long term (current) drug therapy: Secondary | ICD-10-CM | POA: Diagnosis not present

## 2019-02-07 DIAGNOSIS — F329 Major depressive disorder, single episode, unspecified: Secondary | ICD-10-CM | POA: Diagnosis not present

## 2019-02-07 DIAGNOSIS — Z6830 Body mass index (BMI) 30.0-30.9, adult: Secondary | ICD-10-CM | POA: Diagnosis not present

## 2019-02-08 DIAGNOSIS — E1140 Type 2 diabetes mellitus with diabetic neuropathy, unspecified: Secondary | ICD-10-CM | POA: Diagnosis not present

## 2019-02-08 DIAGNOSIS — M79604 Pain in right leg: Secondary | ICD-10-CM | POA: Diagnosis not present

## 2019-02-09 NOTE — Progress Notes (Signed)
[facility] Note  02/14/2019     CHIEF COMPLAINT Patient presents for Retina Follow Up   HISTORY OF PRESENT ILLNESS: Kurt Duran is a 73 y.o. male who presents to the clinic today for:   HPI    Retina Follow Up    Patient presents with  Diabetic Retinopathy.  In both eyes.  This started months ago.  Severity is mild.  Duration of 4 weeks.  Since onset it is stable.  I, the attending physician,  performed the HPI with the patient and updated documentation appropriately.          Comments    73 y/o male pt here for 4 wk f/u for NPDR OU.  S/p focal laser OS 10.12.20.  No change in [place] OU.  Denies pain, flashes, new floaters.  No gtts.  BS 176 yesterday.  A1C unknown.  BP doing well.       Last edited by Bernarda Caffey, MD on 02/14/2019  8:07 AM. (History)    Patient states he has no problems after the laser treatment at last visit  Referring physician: Particia Nearing, [place] [address],  [place] [address]  HISTORICAL INFORMATION:   Selected notes from the MEDICAL RECORD NUMBER Referred by Dr. Radford Pax for concern of BDR LEE:  Ocular Hx- PMH-anxiety, COPD, gout, HLD, DM (takes metformin, glipizide, januvia)   CURRENT MEDICATIONS: No current outpatient medications on file. (Ophthalmic Drugs)   No current facility-administered medications for this visit.  (Ophthalmic Drugs)   Current Outpatient Medications (Other)  Medication Sig  . albuterol (PROVENTIL HFA;VENTOLIN HFA) 108 (90 Base) MCG/ACT inhaler Inhale 2 puffs into the lungs every 4 (four) hours as needed for wheezing or shortness of breath.  . allopurinol (ZYLOPRIM) 300 MG tablet Take 300 mg by mouth daily.  Marland Kitchen ALPRAZolam (XANAX) 0.5 MG tablet Take 1 tablet by mouth 2 (two) times daily.  Marland Kitchen atorvastatin (LIPITOR) 40 MG tablet Take 40 mg by mouth every evening. for cholesterol  . diclofenac sodium (VOLTAREN) 1 % GEL Voltaren 1 % topical gel  . FARXIGA 10 MG TABS tablet Take 10 mg  by mouth daily.  Marland Kitchen FLUoxetine (PROZAC) 20 MG capsule Take 20 mg by mouth daily.  Marland Kitchen gabapentin (NEURONTIN) 100 MG capsule TAKE ONE CAPSULE BY MOUTH EVERY MORNING & TAKE TWO CAPSULES BY MOUTH AT BEDTIME  . glipiZIDE (GLUCOTROL XL) 10 MG 24 hr tablet Take 10 mg by mouth 2 (two) times daily.  Marland Kitchen HYDROcodone-acetaminophen (NORCO/VICODIN) 5-325 MG tablet Take 0.5-1 tablets by mouth 2 (two) times daily. Pt takes 0.5 tablet every morning and 1 tablet at bedtime  . JANUVIA 100 MG tablet Take 1 tablet by mouth daily.  Marland Kitchen lisinopril (ZESTRIL) 20 MG tablet Take by mouth.  . lovastatin (MEVACOR) 40 MG tablet Take 40 mg by mouth daily.  . metFORMIN (GLUCOPHAGE) 1000 MG tablet Take 1 tablet by mouth 2 (two) times daily.  . Multiple Minerals (CALCIUM-MAGNESIUM-ZINC) TABS Take 1 tablet by mouth 2 (two) times daily.  Marland Kitchen omega-3 acid ethyl esters (LOVAZA) 1 g capsule Take by mouth.  . Omega-3 Fatty Acids (FISH OIL) 1000 MG CAPS Take 2 capsules by mouth 2 (two) times daily.  Marland Kitchen omeprazole (PRILOSEC) 20 MG capsule Take 20 mg by mouth daily.  . predniSONE (STERAPRED UNI-PAK 21 TAB) 5 MG (21) TBPK tablet TAKE AS DIRECTED WITH FOOD  . terazosin (HYTRIN) 10 MG capsule Take 10 mg by mouth daily.  No current facility-administered medications for this visit.  (Other)      REVIEW OF SYSTEMS: ROS    Positive for: Endocrine, Eyes   Negative for: Constitutional, Gastrointestinal, Neurological, Skin, Genitourinary, Musculoskeletal, HENT, Cardiovascular, Respiratory, Psychiatric, Allergic/Imm, Heme/Lymph   Last edited by Matthew Folks, COA on 02/14/2019  7:41 AM. (History)       ALLERGIES Allergies  Allergen Reactions  . Eggs Or Egg-Derived Products Nausea And Vomiting    PAST MEDICAL HISTORY Past Medical History:  Diagnosis Date  . Anxiety   . COPD (chronic obstructive pulmonary disease) ([place])   . Diabetes mellitus without complication ([place])   . Diabetic retinopathy ([place])    NPDR OU  . GERD  (gastroesophageal reflux disease)   . Gout   . Hypertension   . Hypertensive retinopathy    OU   Past Surgical History:  Procedure Laterality Date  . CATARACT EXTRACTION W/PHACO Left 04/03/2016   Procedure: CATARACT EXTRACTION PHACO AND INTRAOCULAR LENS PLACEMENT LEFT EYE CDE= 11.33;  Surgeon: Tonny Branch, MD;  Location: [facility];  Service: Ophthalmology;  Laterality: Left;  left  . CATARACT EXTRACTION W/PHACO Right 04/28/2016   Procedure: CATARACT EXTRACTION PHACO AND INTRAOCULAR LENS PLACEMENT (IOC);  Surgeon: Tonny Branch, MD;  Location: [facility];  Service: Ophthalmology;  Laterality: Right;  cde-10.23   . EYE SURGERY Bilateral    Cat Sx OU  . KNEE ARTHROSCOPY Right   . NECK SURGERY    . ROTATOR CUFF REPAIR Right     FAMILY HISTORY Family History  Adopted: Yes    SOCIAL HISTORY Social History   Tobacco Use  . Smoking status: Former Smoker    Types: Cigarettes    Quit date: 03/26/1989    Years since quitting: 29.9  . Smokeless tobacco: Never Used  Substance Use Topics  . Alcohol use: No  . Drug use: No         OPHTHALMIC EXAM:  Base Eye Exam    Visual Acuity (Snellen - Linear)      Right Left   Dist [place] 20/20 -2 20/20       Tonometry (Tonopen, 7:45 AM)      Right Left   Pressure 12 13       Pupils      Dark Light Shape React APD   Right 3 2 Round Brisk None   Left 3 2 Round Brisk None       Visual Fields (Counting fingers)      Left Right    Full Full       Extraocular Movement      Right Left    Full, Ortho Full, Ortho       Neuro/Psych    Oriented x3: Yes   Mood/Affect: Normal       Dilation    Both eyes: 1.0% Mydriacyl, 2.5% Phenylephrine @ 7:45 AM        Slit Lamp and Fundus Exam    Slit Lamp Exam      Right Left   Lids/Lashes dermatochalasis, mild MGD dermatochalasis, mild MGD   Conjunctiva/Sclera White and quiet White and quiet, temproal pinguecula   Cornea 1+ PEE, tear film debris, well healed temporal catarct wound 1+ PEE, tear  film debris, well healed temporal catarct wound   Anterior Chamber deep and clear deep and clear   Iris round and moderately dilated to 5 mm, no NVI round and poorly dilated to 4.5 mm   Lens PCIOL in good position PCIOL in good  position   Vitreous syneresis, Posterior vitreous detachment syneresis, Posterior vitreous detachment       Fundus Exam      Right Left   Disc mild pallor, temporal PPA, tilted disc mild pallor, mild temp PPA   C/D Ratio 0.6 0.5   Macula Good foveal reflex, Multiple blot hemes superior macula, +cluster of MA superior and ST to fovea, RPE mottling and clumping, mild cystic changes flat, blunted foveal reflex, +focal cluster of MA -- improved, now punctate focal laser scars, interval improvemetn in cystic changes, scattered MA elsewhere   Vessels attenuated, +A/V crossing changes, Tortuous attenuated, +A/V crossing changes, Tortuous   Periphery attached, scattered blot hemes +peripapillary flame hemes nasal to disc Attached, scattered MA; blot hemes superiorly          IMAGING AND PROCEDURES  Imaging and Procedures for @TODAY @  OCT, Retina - OU - Both Eyes       Right Eye Quality was good. Central Foveal Thickness: 317. Progression has been stable. Findings include normal foveal contour, no SRF, intraretinal fluid (Persistent IRF).   Left Eye Quality was good. Central Foveal Thickness: 309. Progression has improved. Findings include no SRF, intraretinal fluid, normal foveal contour (Interval improvement in IRF/cystic changes).   Notes *Images captured and stored on drive  Diagnosis / Impression:  OD: NFP, no SRF, persistent IRF/cystic changes OS: +DME nasal macula improved -- focal ORA nasal macula    Clinical management:  See below  Abbreviations: NFP - Normal foveal profile. CME - cystoid macular edema. PED - pigment epithelial detachment. IRF - intraretinal fluid. SRF - subretinal fluid. EZ - ellipsoid zone. ERM - epiretinal membrane. ORA - outer  retinal atrophy. ORT - outer retinal tubulation. SRHM - subretinal hyper-reflective material        Focal Laser - OD - Right Eye       LASER PROCEDURE NOTE  Diagnosis:   Diabetic macular edema, right eye  Procedure:  Focal laser photocoagulation using slit lamp laser, right eye  Anesthesia:  Topical  Surgeon: Bernarda Caffey, MD, PhD   Informed consent obtained, operative eye marked, and time out performed prior to initiation of laser.   [place] [id number] Focal/Grid laser Power: 195 mW Duration: 40 msec  Spot size: 125 microns  # spots: 67 spots placed to MAs mostly superior and temporal to fovea.  Complications: None.  RTC: 4 wks  Patient tolerated the procedure well and received written and verbal post-procedure care information/education.                   ASSESSMENT/PLAN:    ICD-10-CM   1. 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 Severe nonproliferative diabetic retinopathy of left eye with macular edema associated with type 2 diabetes mellitus ([place])  [id number]   3. Retinal edema  H35.81 OCT, Retina - OU - Both Eyes  4. Essential hypertension  I10   5. Hypertensive retinopathy of both eyes  H35.033   6. Pseudophakia of both eyes  Z96.1     1-3. Severe non-proliferative diabetic retinopathy, both eyes  - OD mild cystic changes/DME  - OS +DME  - FA 10/25/18 - late leaking MA OU; no NV  - s/p IVA OS #1 (07.20.20), #2 (08.17.20), #3 (09.14.20)  - s/p focal laser OS (10.12.20)  - OCT shows persistent IRF OD; OS interval improvement in IRF/cystic changes  -  BCVA 20/20 OU today  - exam shows clusters of MA amenable to focal laser  - recommend focal laser OD today, 11.09.20  - pt wishes to proceed  - RBA of procedure discussed, questions answered  - informed consent obtained and signed  - see procedure note  - start PF qid OD x7  days  - f/u in 4 wks, DFE, OCT  4,5.Hypertensive retinopathy OU  - discussed importance of tight BP control  - monitor  6. Pseudophakia OU  - s/p CE/IOL OU  - beautiful surgeries, doing well  - monitor   Ophthalmic Meds Ordered this visit:  No orders of the defined types were placed in this encounter.      Return in about 4 weeks (around 03/14/2019) for f/u NPDR OU, DFE, OCT.  There are no Patient Instructions on file for this visit.   Explained the diagnoses, plan, and follow up with the patient and they expressed understanding.  Patient expressed understanding of the importance of proper follow up care.    This document serves as a record of services personally performed by Gardiner Sleeper, MD, PhD. It was created on their behalf by Leeann Must, [place], a certified ophthalmic assistant. The creation of this record is the provider's dictation and/or activities during the visit.    Electronically signed by: Leeann Must, [place] 11.06.2020 9:14 AM   This document serves as a record of services personally performed by Gardiner Sleeper, MD, PhD. It was created on their behalf by Ernest Mallick, OA, an ophthalmic assistant. The creation of this record is the provider's dictation and/or activities during the visit.    Electronically signed by: Ernest Mallick, OA 11.09.2020 9:14 AM    Gardiner Sleeper, M.D., Ph.D. Diseases & Surgery of the Retina and Vitreous [facility]  I have reviewed the above documentation for accuracy and completeness, and I agree with the above. Gardiner Sleeper, M.D., Ph.D. 02/14/19 9:14 AM    Abbreviations: M myopia (nearsighted); A astigmatism; H hyperopia (farsighted); P presbyopia; Mrx spectacle prescription;  CTL contact lenses; OD right eye; OS left eye; OU both eyes  XT exotropia; ET esotropia; PEK punctate epithelial keratitis; PEE punctate epithelial erosions; DES dry eye syndrome; MGD meibomian gland dysfunction; ATs artificial  tears; PFAT's preservative free artificial tears; [place] nuclear sclerotic cataract; PSC posterior subcapsular cataract; ERM epi-retinal membrane; PVD posterior vitreous detachment; RD retinal detachment; DM diabetes mellitus; DR diabetic retinopathy; NPDR non-proliferative diabetic retinopathy; PDR proliferative diabetic retinopathy; CSME clinically significant macular edema; DME diabetic macular edema; dbh dot blot hemorrhages; CWS cotton wool spot; POAG primary open angle glaucoma; C/D cup-to-disc ratio; HVF humphrey visual field; GVF goldmann visual field; OCT optical coherence tomography; IOP intraocular pressure; BRVO Branch retinal vein occlusion; CRVO central retinal vein occlusion; CRAO central retinal artery occlusion; BRAO branch retinal artery occlusion; RT retinal tear; SB scleral buckle; PPV pars plana vitrectomy; VH Vitreous hemorrhage; PRP panretinal laser photocoagulation; IVK intravitreal kenalog; VMT vitreomacular traction; MH Macular hole;  NVD neovascularization of the disc; NVE neovascularization elsewhere; AREDS age related eye disease study; ARMD age related macular degeneration; POAG primary open angle glaucoma; EBMD epithelial/anterior basement membrane dystrophy; ACIOL anterior chamber intraocular lens; IOL intraocular lens; PCIOL posterior chamber intraocular lens; Phaco/IOL phacoemulsification with intraocular lens placement; [place] photorefractive keratectomy; LASIK laser assisted in situ keratomileusis; HTN hypertension; DM diabetes mellitus; COPD chronic obstructive pulmonary disease

## 2019-02-14 ENCOUNTER — Other Ambulatory Visit: Payer: Self-pay

## 2019-02-14 ENCOUNTER — Ambulatory Visit (INDEPENDENT_AMBULATORY_CARE_PROVIDER_SITE_OTHER): Payer: Medicare Other | Admitting: Ophthalmology

## 2019-02-14 ENCOUNTER — Encounter (INDEPENDENT_AMBULATORY_CARE_PROVIDER_SITE_OTHER): Payer: Self-pay | Admitting: Ophthalmology

## 2019-02-14 DIAGNOSIS — E113411 Type 2 diabetes mellitus with severe nonproliferative diabetic retinopathy with macular edema, right eye: Secondary | ICD-10-CM

## 2019-02-14 DIAGNOSIS — M47818 Spondylosis without myelopathy or radiculopathy, sacral and sacrococcygeal region: Secondary | ICD-10-CM | POA: Diagnosis not present

## 2019-02-14 DIAGNOSIS — Z299 Encounter for prophylactic measures, unspecified: Secondary | ICD-10-CM | POA: Diagnosis not present

## 2019-02-14 DIAGNOSIS — H3581 Retinal edema: Secondary | ICD-10-CM | POA: Diagnosis not present

## 2019-02-14 DIAGNOSIS — Z6832 Body mass index (BMI) 32.0-32.9, adult: Secondary | ICD-10-CM | POA: Diagnosis not present

## 2019-02-14 DIAGNOSIS — F329 Major depressive disorder, single episode, unspecified: Secondary | ICD-10-CM | POA: Diagnosis not present

## 2019-02-14 DIAGNOSIS — E113412 Type 2 diabetes mellitus with severe nonproliferative diabetic retinopathy with macular edema, left eye: Secondary | ICD-10-CM

## 2019-02-14 DIAGNOSIS — H35033 Hypertensive retinopathy, bilateral: Secondary | ICD-10-CM

## 2019-02-14 DIAGNOSIS — E11649 Type 2 diabetes mellitus with hypoglycemia without coma: Secondary | ICD-10-CM | POA: Diagnosis not present

## 2019-02-14 DIAGNOSIS — Z961 Presence of intraocular lens: Secondary | ICD-10-CM

## 2019-02-14 DIAGNOSIS — I10 Essential (primary) hypertension: Secondary | ICD-10-CM

## 2019-02-14 DIAGNOSIS — F419 Anxiety disorder, unspecified: Secondary | ICD-10-CM | POA: Diagnosis not present

## 2019-02-22 DIAGNOSIS — F419 Anxiety disorder, unspecified: Secondary | ICD-10-CM | POA: Diagnosis not present

## 2019-02-22 DIAGNOSIS — Z6830 Body mass index (BMI) 30.0-30.9, adult: Secondary | ICD-10-CM | POA: Diagnosis not present

## 2019-02-22 DIAGNOSIS — I10 Essential (primary) hypertension: Secondary | ICD-10-CM | POA: Diagnosis not present

## 2019-02-22 DIAGNOSIS — F329 Major depressive disorder, single episode, unspecified: Secondary | ICD-10-CM | POA: Diagnosis not present

## 2019-02-22 DIAGNOSIS — E7800 Pure hypercholesterolemia, unspecified: Secondary | ICD-10-CM | POA: Diagnosis not present

## 2019-02-22 DIAGNOSIS — E1165 Type 2 diabetes mellitus with hyperglycemia: Secondary | ICD-10-CM | POA: Diagnosis not present

## 2019-02-22 DIAGNOSIS — Z299 Encounter for prophylactic measures, unspecified: Secondary | ICD-10-CM | POA: Diagnosis not present

## 2019-02-28 DIAGNOSIS — E1142 Type 2 diabetes mellitus with diabetic polyneuropathy: Secondary | ICD-10-CM | POA: Diagnosis not present

## 2019-02-28 DIAGNOSIS — F419 Anxiety disorder, unspecified: Secondary | ICD-10-CM | POA: Diagnosis not present

## 2019-02-28 DIAGNOSIS — F329 Major depressive disorder, single episode, unspecified: Secondary | ICD-10-CM | POA: Diagnosis not present

## 2019-02-28 DIAGNOSIS — I10 Essential (primary) hypertension: Secondary | ICD-10-CM | POA: Diagnosis not present

## 2019-02-28 DIAGNOSIS — E1165 Type 2 diabetes mellitus with hyperglycemia: Secondary | ICD-10-CM | POA: Diagnosis not present

## 2019-02-28 DIAGNOSIS — Z299 Encounter for prophylactic measures, unspecified: Secondary | ICD-10-CM | POA: Diagnosis not present

## 2019-02-28 DIAGNOSIS — Z6830 Body mass index (BMI) 30.0-30.9, adult: Secondary | ICD-10-CM | POA: Diagnosis not present

## 2019-03-14 ENCOUNTER — Encounter (INDEPENDENT_AMBULATORY_CARE_PROVIDER_SITE_OTHER): Payer: Medicare Other | Admitting: Ophthalmology

## 2019-03-14 NOTE — Progress Notes (Signed)
[facility] Note  03/16/2019     CHIEF COMPLAINT Patient presents for Retina Follow Up   HISTORY OF PRESENT ILLNESS: Kurt Duran is a 73 y.o. male who presents to the clinic today for:   HPI    Retina Follow Up    Patient presents with  Diabetic Retinopathy.  In both eyes.  This started weeks ago.  Severity is moderate.  Duration of weeks.  Since onset it is stable.  I, the attending physician,  performed the HPI with the patient and updated documentation appropriately.          Comments    BS: 154 this AM A1c: ? Pt states his vision is stable OU.  Patient denies eye pain or discomfort and denies any new or worsening floaters or fol OU.       Last edited by Bernarda Caffey, MD on 03/16/2019 10:14 AM. (History)    Patient had no problems with laser at last visit, he used the PF as directed, he states his PCP said his blood sugar was "looking good"  Referring physician: No referring provider defined for this encounter.  HISTORICAL INFORMATION:   Selected notes from the MEDICAL RECORD NUMBER Referred by Dr. Radford Pax for concern of BDR LEE:  Ocular Hx- PMH-anxiety, COPD, gout, HLD, DM (takes metformin, glipizide, januvia)   CURRENT MEDICATIONS: No current outpatient medications on file. (Ophthalmic Drugs)   No current facility-administered medications for this visit. (Ophthalmic Drugs)   Current Outpatient Medications (Other)  Medication Sig  . albuterol (PROVENTIL HFA;VENTOLIN HFA) 108 (90 Base) MCG/ACT inhaler Inhale 2 puffs into the lungs every 4 (four) hours as needed for wheezing or shortness of breath.  . allopurinol (ZYLOPRIM) 300 MG tablet Take 300 mg by mouth daily.  Marland Kitchen ALPRAZolam (XANAX) 0.5 MG tablet Take 1 tablet by mouth 2 (two) times daily.  Marland Kitchen atorvastatin (LIPITOR) 40 MG tablet Take 40 mg by mouth every evening. for cholesterol  . diclofenac sodium (VOLTAREN) 1 % GEL Voltaren 1 % topical gel  . FARXIGA 10 MG TABS tablet Take 10  mg by mouth daily.  Marland Kitchen FLUoxetine (PROZAC) 20 MG capsule Take 20 mg by mouth daily.  Marland Kitchen gabapentin (NEURONTIN) 100 MG capsule TAKE ONE CAPSULE BY MOUTH EVERY MORNING & TAKE TWO CAPSULES BY MOUTH AT BEDTIME  . glipiZIDE (GLUCOTROL XL) 10 MG 24 hr tablet Take 10 mg by mouth 2 (two) times daily.  Marland Kitchen HYDROcodone-acetaminophen (NORCO/VICODIN) 5-325 MG tablet Take 0.5-1 tablets by mouth 2 (two) times daily. Pt takes 0.5 tablet every morning and 1 tablet at bedtime  . JANUVIA 100 MG tablet Take 1 tablet by mouth daily.  Marland Kitchen lisinopril (ZESTRIL) 20 MG tablet Take by mouth.  . lovastatin (MEVACOR) 40 MG tablet Take 40 mg by mouth daily.  . metFORMIN (GLUCOPHAGE) 1000 MG tablet Take 1 tablet by mouth 2 (two) times daily.  . Multiple Minerals (CALCIUM-MAGNESIUM-ZINC) TABS Take 1 tablet by mouth 2 (two) times daily.  Marland Kitchen omega-3 acid ethyl esters (LOVAZA) 1 g capsule Take by mouth.  . Omega-3 Fatty Acids (FISH OIL) 1000 MG CAPS Take 2 capsules by mouth 2 (two) times daily.  Marland Kitchen omeprazole (PRILOSEC) 20 MG capsule Take 20 mg by mouth daily.  . predniSONE (STERAPRED UNI-PAK 21 TAB) 5 MG (21) TBPK tablet TAKE AS DIRECTED WITH FOOD  . terazosin (HYTRIN) 10 MG capsule Take 10 mg by mouth daily.   No current facility-administered medications for this visit. (Other)  REVIEW OF SYSTEMS: ROS    Positive for: Endocrine, Eyes   Negative for: Constitutional, Gastrointestinal, Neurological, Skin, Genitourinary, Musculoskeletal, HENT, Cardiovascular, Respiratory, Psychiatric, Allergic/Imm, Heme/Lymph   Last edited by Doneen Poisson on 03/16/2019  9:27 AM. (History)       ALLERGIES Allergies  Allergen Reactions  . Eggs Or Egg-Derived Products Nausea And Vomiting    PAST MEDICAL HISTORY Past Medical History:  Diagnosis Date  . Anxiety   . COPD (chronic obstructive pulmonary disease) ([place])   . Diabetes mellitus without complication ([place])   . Diabetic retinopathy ([place])    NPDR OU  . GERD (gastroesophageal  reflux disease)   . Gout   . Hypertension   . Hypertensive retinopathy    OU   Past Surgical History:  Procedure Laterality Date  . CATARACT EXTRACTION W/PHACO Left 04/03/2016   Procedure: CATARACT EXTRACTION PHACO AND INTRAOCULAR LENS PLACEMENT LEFT EYE CDE= 11.33;  Surgeon: Tonny Branch, MD;  Location: [facility];  Service: Ophthalmology;  Laterality: Left;  left  . CATARACT EXTRACTION W/PHACO Right 04/28/2016   Procedure: CATARACT EXTRACTION PHACO AND INTRAOCULAR LENS PLACEMENT (IOC);  Surgeon: Tonny Branch, MD;  Location: [facility];  Service: Ophthalmology;  Laterality: Right;  cde-10.23   . EYE SURGERY Bilateral    Cat Sx OU  . KNEE ARTHROSCOPY Right   . NECK SURGERY    . ROTATOR CUFF REPAIR Right     FAMILY HISTORY Family History  Adopted: Yes    SOCIAL HISTORY Social History   Tobacco Use  . Smoking status: Former Smoker    Types: Cigarettes    Quit date: 03/26/1989    Years since quitting: 30.0  . Smokeless tobacco: Never Used  Substance Use Topics  . Alcohol use: No  . Drug use: No         OPHTHALMIC EXAM:  Base Eye Exam    Visual Acuity (Snellen - Linear)      Right Left   Dist [place] 20/20 -1 20/20 -2       Tonometry (Tonopen, 9:30 AM)      Right Left   Pressure 17 16       Pupils      Dark Light Shape React APD   Right 2 1 Round Minimal 0   Left 2 1 Round Minimal 0       Visual Fields      Left Right    Full Full       Extraocular Movement      Right Left    Full Full       Neuro/Psych    Oriented x3: Yes   Mood/Affect: Normal       Dilation    Both eyes: 1.0% Mydriacyl, 2.5% Phenylephrine @ 9:30 AM        Slit Lamp and Fundus Exam    Slit Lamp Exam      Right Left   Lids/Lashes dermatochalasis, mild MGD dermatochalasis, mild MGD   Conjunctiva/Sclera White and quiet White and quiet, temproal pinguecula   Cornea 1+ PEE, tear film debris, well healed temporal catarct wound 1+ PEE, tear film debris, well healed temporal catarct wound    Anterior Chamber deep and clear deep and clear   Iris round and moderately dilated to 5 mm, no NVI round and poorly dilated to 4.5 mm   Lens PCIOL in good position PCIOL in good position   Vitreous syneresis, Posterior vitreous detachment syneresis, Posterior vitreous detachment  Fundus Exam      Right Left   Disc mild pallor, temporal PPA, tilted disc mild pallor, mild temp PPA   C/D Ratio 0.6 0.5   Macula Good foveal reflex, Multiple blot hemes superior macula, +cluster of MA superior and ST to fovea - improved, RPE mottling and clumping, mild cystic changes, good focal laser changes superior and temporal to fovea flat, blunted foveal reflex, +focal cluster of MA -- improved, now punctate focal laser scars -- SN macula, interval improvement in cystic changes, rare MA elsewhere   Vessels attenuated, +A/V crossing changes, Tortuous attenuated, +A/V crossing changes, Tortuous   Periphery attached, scattered blot hemes +peripapillary flame hemes nasal to disc Attached, scattered MA; blot hemes superiorly          IMAGING AND PROCEDURES  Imaging and Procedures for @TODAY @  OCT, Retina - OU - Both Eyes       Right Eye Quality was good. Central Foveal Thickness: 318. Progression has been stable. Findings include normal foveal contour, no SRF, intraretinal fluid (Persistent IRF).   Left Eye Quality was good. Central Foveal Thickness: 316. Progression has improved. Findings include no SRF, intraretinal fluid, normal foveal contour (Mild Interval improvement in IRF/cystic changes).   Notes *Images captured and stored on drive  Diagnosis / Impression:  OD: NFP, no SRF, persistent IRF/cystic changes OS: +DME nasal macula improved -- focal ORA nasal macula    Clinical management:  See below  Abbreviations: NFP - Normal foveal profile. CME - cystoid macular edema. PED - pigment epithelial detachment. IRF - intraretinal fluid. SRF - subretinal fluid. EZ - ellipsoid zone. ERM -  epiretinal membrane. ORA - outer retinal atrophy. ORT - outer retinal tubulation. SRHM - subretinal hyper-reflective material                 ASSESSMENT/PLAN:    ICD-10-CM   1. 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 Severe nonproliferative diabetic retinopathy of left eye with macular edema associated with type 2 diabetes mellitus ([place])  [id number]   3. Retinal edema  H35.81 OCT, Retina - OU - Both Eyes  4. Essential hypertension  I10   5. Hypertensive retinopathy of both eyes  H35.033   6. Pseudophakia of both eyes  Z96.1     1-3. Severe non-proliferative diabetic retinopathy, both eyes  - OD mild cystic changes/DME  - OS +DME  - FA 10/25/18 - late leaking MA OU; no NV  - s/p IVA OS #1 (07.20.20), #2 (08.17.20), #3 (09.14.20)  - s/p focal laser OS (10.12.20)  - s/p focal laser OD (11.09.20)  - OCT shows persistent IRF OD; OS interval improvement in IRF/cystic changes  - BCVA 20/20 OU today  - completed  PF qid OD x7 days  - f/u in 3 months, DFE, OCT  4,5.Hypertensive retinopathy OU  - discussed importance of tight BP control  - monitor  6. Pseudophakia OU  - s/p CE/IOL OU  - beautiful surgeries, doing well  - monitor   Ophthalmic Meds Ordered this visit:  No orders of the defined types were placed in this encounter.      Return in about 3 months (around 06/14/2019) for f/u NPDR OU, DFE, OCT.  There are no Patient Instructions on file for this visit.   Explained the diagnoses, plan, and follow up with the patient and they expressed understanding.  Patient expressed understanding of the importance of proper  follow up care.    This document serves as a record of services personally performed by Gardiner Sleeper, MD, PhD. It was created on their behalf by Leeann Must, [place], a certified ophthalmic assistant. The creation of this record is the provider's dictation  and/or activities during the visit.    This document serves as a record of services personally performed by Gardiner Sleeper, MD, PhD. It was created on their behalf by Roselee Nova, COMT. The creation of this record is the provider's dictation and/or activities during the visit.  Electronically signed by: Roselee Nova, COMT 03/19/19 2:22 AM   This document serves as a record of services personally performed by Gardiner Sleeper, MD, PhD. It was created on their behalf by Ernest Mallick, OA, an ophthalmic assistant. The creation of this record is the provider's dictation and/or activities during the visit.    Electronically signed by: Ernest Mallick, OA 12.08.2020 2:22 AM  Gardiner Sleeper, M.D., Ph.D. Diseases & Surgery of the Retina and Vitreous [facility]    I have reviewed the above documentation for accuracy and completeness, and I agree with the above. Gardiner Sleeper, M.D., Ph.D. 03/19/19 2:22 AM   Abbreviations: M myopia (nearsighted); A astigmatism; H hyperopia (farsighted); P presbyopia; Mrx spectacle prescription;  CTL contact lenses; OD right eye; OS left eye; OU both eyes  XT exotropia; ET esotropia; PEK punctate epithelial keratitis; PEE punctate epithelial erosions; DES dry eye syndrome; MGD meibomian gland dysfunction; ATs artificial tears; PFAT's preservative free artificial tears; [place] nuclear sclerotic cataract; PSC posterior subcapsular cataract; ERM epi-retinal membrane; PVD posterior vitreous detachment; RD retinal detachment; DM diabetes mellitus; DR diabetic retinopathy; NPDR non-proliferative diabetic retinopathy; PDR proliferative diabetic retinopathy; CSME clinically significant macular edema; DME diabetic macular edema; dbh dot blot hemorrhages; CWS cotton wool spot; POAG primary open angle glaucoma; C/D cup-to-disc ratio; HVF humphrey visual field; GVF goldmann visual field; OCT optical coherence tomography; IOP intraocular pressure; BRVO Branch retinal  vein occlusion; CRVO central retinal vein occlusion; CRAO central retinal artery occlusion; BRAO branch retinal artery occlusion; RT retinal tear; SB scleral buckle; PPV pars plana vitrectomy; VH Vitreous hemorrhage; PRP panretinal laser photocoagulation; IVK intravitreal kenalog; VMT vitreomacular traction; MH Macular hole;  NVD neovascularization of the disc; NVE neovascularization elsewhere; AREDS age related eye disease study; ARMD age related macular degeneration; POAG primary open angle glaucoma; EBMD epithelial/anterior basement membrane dystrophy; ACIOL anterior chamber intraocular lens; IOL intraocular lens; PCIOL posterior chamber intraocular lens; Phaco/IOL phacoemulsification with intraocular lens placement; [place] photorefractive keratectomy; LASIK laser assisted in situ keratomileusis; HTN hypertension; DM diabetes mellitus; COPD chronic obstructive pulmonary disease

## 2019-03-16 ENCOUNTER — Encounter (INDEPENDENT_AMBULATORY_CARE_PROVIDER_SITE_OTHER): Payer: Self-pay | Admitting: Ophthalmology

## 2019-03-16 ENCOUNTER — Ambulatory Visit (INDEPENDENT_AMBULATORY_CARE_PROVIDER_SITE_OTHER): Payer: Medicare Other | Admitting: Ophthalmology

## 2019-03-16 ENCOUNTER — Other Ambulatory Visit: Payer: Self-pay

## 2019-03-16 DIAGNOSIS — H3581 Retinal edema: Secondary | ICD-10-CM

## 2019-03-16 DIAGNOSIS — H35033 Hypertensive retinopathy, bilateral: Secondary | ICD-10-CM | POA: Diagnosis not present

## 2019-03-16 DIAGNOSIS — E113411 Type 2 diabetes mellitus with severe nonproliferative diabetic retinopathy with macular edema, right eye: Secondary | ICD-10-CM

## 2019-03-16 DIAGNOSIS — E113412 Type 2 diabetes mellitus with severe nonproliferative diabetic retinopathy with macular edema, left eye: Secondary | ICD-10-CM

## 2019-03-16 DIAGNOSIS — Z961 Presence of intraocular lens: Secondary | ICD-10-CM | POA: Diagnosis not present

## 2019-03-16 DIAGNOSIS — I10 Essential (primary) hypertension: Secondary | ICD-10-CM

## 2019-04-11 DIAGNOSIS — E1142 Type 2 diabetes mellitus with diabetic polyneuropathy: Secondary | ICD-10-CM | POA: Diagnosis not present

## 2019-04-11 DIAGNOSIS — E11649 Type 2 diabetes mellitus with hypoglycemia without coma: Secondary | ICD-10-CM | POA: Diagnosis not present

## 2019-04-11 DIAGNOSIS — Z6829 Body mass index (BMI) 29.0-29.9, adult: Secondary | ICD-10-CM | POA: Diagnosis not present

## 2019-04-11 DIAGNOSIS — Z299 Encounter for prophylactic measures, unspecified: Secondary | ICD-10-CM | POA: Diagnosis not present

## 2019-04-11 DIAGNOSIS — K219 Gastro-esophageal reflux disease without esophagitis: Secondary | ICD-10-CM | POA: Diagnosis not present

## 2019-04-11 DIAGNOSIS — Z87891 Personal history of nicotine dependence: Secondary | ICD-10-CM | POA: Diagnosis not present

## 2019-04-26 DIAGNOSIS — K219 Gastro-esophageal reflux disease without esophagitis: Secondary | ICD-10-CM | POA: Diagnosis not present

## 2019-04-26 DIAGNOSIS — F419 Anxiety disorder, unspecified: Secondary | ICD-10-CM | POA: Diagnosis not present

## 2019-04-26 DIAGNOSIS — E1165 Type 2 diabetes mellitus with hyperglycemia: Secondary | ICD-10-CM | POA: Diagnosis not present

## 2019-04-26 DIAGNOSIS — I10 Essential (primary) hypertension: Secondary | ICD-10-CM | POA: Diagnosis not present

## 2019-04-26 DIAGNOSIS — Z299 Encounter for prophylactic measures, unspecified: Secondary | ICD-10-CM | POA: Diagnosis not present

## 2019-04-26 DIAGNOSIS — F329 Major depressive disorder, single episode, unspecified: Secondary | ICD-10-CM | POA: Diagnosis not present

## 2019-04-26 DIAGNOSIS — Z6828 Body mass index (BMI) 28.0-28.9, adult: Secondary | ICD-10-CM | POA: Diagnosis not present

## 2019-05-03 DIAGNOSIS — Z713 Dietary counseling and surveillance: Secondary | ICD-10-CM | POA: Diagnosis not present

## 2019-05-03 DIAGNOSIS — E11649 Type 2 diabetes mellitus with hypoglycemia without coma: Secondary | ICD-10-CM | POA: Diagnosis not present

## 2019-05-03 DIAGNOSIS — Z299 Encounter for prophylactic measures, unspecified: Secondary | ICD-10-CM | POA: Diagnosis not present

## 2019-05-03 DIAGNOSIS — R634 Abnormal weight loss: Secondary | ICD-10-CM | POA: Diagnosis not present

## 2019-05-03 DIAGNOSIS — Z6832 Body mass index (BMI) 32.0-32.9, adult: Secondary | ICD-10-CM | POA: Diagnosis not present

## 2019-05-06 ENCOUNTER — Encounter: Payer: Self-pay | Admitting: Internal Medicine

## 2019-05-09 ENCOUNTER — Encounter (HOSPITAL_COMMUNITY): Payer: Self-pay | Admitting: Emergency Medicine

## 2019-05-09 ENCOUNTER — Emergency Department (HOSPITAL_COMMUNITY): Payer: Medicare Other

## 2019-05-09 ENCOUNTER — Inpatient Hospital Stay (HOSPITAL_COMMUNITY)
Admission: EM | Admit: 2019-05-09 | Discharge: 2019-05-16 | DRG: 356 | Disposition: A | Discharge status: Home Health | Payer: Medicare Other | Attending: Internal Medicine | Admitting: Internal Medicine

## 2019-05-09 ENCOUNTER — Other Ambulatory Visit: Payer: Self-pay

## 2019-05-09 DIAGNOSIS — K440 Diaphragmatic hernia with obstruction, without gangrene: Secondary | ICD-10-CM | POA: Diagnosis present

## 2019-05-09 DIAGNOSIS — K222 Esophageal obstruction: Secondary | ICD-10-CM | POA: Diagnosis present

## 2019-05-09 DIAGNOSIS — K449 Diaphragmatic hernia without obstruction or gangrene: Secondary | ICD-10-CM | POA: Diagnosis not present

## 2019-05-09 DIAGNOSIS — E119 Type 2 diabetes mellitus without complications: Secondary | ICD-10-CM

## 2019-05-09 DIAGNOSIS — Z95828 Presence of other vascular implants and grafts: Secondary | ICD-10-CM

## 2019-05-09 DIAGNOSIS — K5730 Diverticulosis of large intestine without perforation or abscess without bleeding: Secondary | ICD-10-CM | POA: Diagnosis present

## 2019-05-09 DIAGNOSIS — K219 Gastro-esophageal reflux disease without esophagitis: Secondary | ICD-10-CM | POA: Diagnosis present

## 2019-05-09 DIAGNOSIS — Z6826 Body mass index (BMI) 26.0-26.9, adult: Secondary | ICD-10-CM | POA: Diagnosis not present

## 2019-05-09 DIAGNOSIS — Z7952 Long term (current) use of systemic steroids: Secondary | ICD-10-CM

## 2019-05-09 DIAGNOSIS — Z7984 Long term (current) use of oral hypoglycemic drugs: Secondary | ICD-10-CM

## 2019-05-09 DIAGNOSIS — J449 Chronic obstructive pulmonary disease, unspecified: Secondary | ICD-10-CM | POA: Diagnosis present

## 2019-05-09 DIAGNOSIS — K228 Other specified diseases of esophagus: Secondary | ICD-10-CM

## 2019-05-09 DIAGNOSIS — F329 Major depressive disorder, single episode, unspecified: Secondary | ICD-10-CM | POA: Diagnosis present

## 2019-05-09 DIAGNOSIS — K2980 Duodenitis without bleeding: Secondary | ICD-10-CM | POA: Diagnosis present

## 2019-05-09 DIAGNOSIS — K2970 Gastritis, unspecified, without bleeding: Secondary | ICD-10-CM | POA: Diagnosis present

## 2019-05-09 DIAGNOSIS — C159 Malignant neoplasm of esophagus, unspecified: Secondary | ICD-10-CM | POA: Diagnosis present

## 2019-05-09 DIAGNOSIS — Z79899 Other long term (current) drug therapy: Secondary | ICD-10-CM

## 2019-05-09 DIAGNOSIS — K5900 Constipation, unspecified: Secondary | ICD-10-CM | POA: Diagnosis present

## 2019-05-09 DIAGNOSIS — C155 Malignant neoplasm of lower third of esophagus: Secondary | ICD-10-CM | POA: Diagnosis not present

## 2019-05-09 DIAGNOSIS — K2289 Other specified disease of esophagus: Secondary | ICD-10-CM

## 2019-05-09 DIAGNOSIS — R112 Nausea with vomiting, unspecified: Secondary | ICD-10-CM | POA: Diagnosis present

## 2019-05-09 DIAGNOSIS — R1013 Epigastric pain: Secondary | ICD-10-CM | POA: Diagnosis not present

## 2019-05-09 DIAGNOSIS — Z87891 Personal history of nicotine dependence: Secondary | ICD-10-CM

## 2019-05-09 DIAGNOSIS — M109 Gout, unspecified: Secondary | ICD-10-CM | POA: Diagnosis present

## 2019-05-09 DIAGNOSIS — Z20822 Contact with and (suspected) exposure to covid-19: Secondary | ICD-10-CM | POA: Diagnosis not present

## 2019-05-09 DIAGNOSIS — H35033 Hypertensive retinopathy, bilateral: Secondary | ICD-10-CM | POA: Diagnosis present

## 2019-05-09 DIAGNOSIS — E113293 Type 2 diabetes mellitus with mild nonproliferative diabetic retinopathy without macular edema, bilateral: Secondary | ICD-10-CM | POA: Diagnosis present

## 2019-05-09 DIAGNOSIS — Z91012 Allergy to eggs: Secondary | ICD-10-CM

## 2019-05-09 DIAGNOSIS — E43 Unspecified severe protein-calorie malnutrition: Secondary | ICD-10-CM | POA: Diagnosis not present

## 2019-05-09 DIAGNOSIS — R531 Weakness: Secondary | ICD-10-CM | POA: Diagnosis not present

## 2019-05-09 DIAGNOSIS — D3502 Benign neoplasm of left adrenal gland: Secondary | ICD-10-CM | POA: Diagnosis not present

## 2019-05-09 DIAGNOSIS — F419 Anxiety disorder, unspecified: Secondary | ICD-10-CM | POA: Diagnosis present

## 2019-05-09 DIAGNOSIS — E876 Hypokalemia: Secondary | ICD-10-CM | POA: Diagnosis not present

## 2019-05-09 DIAGNOSIS — I10 Essential (primary) hypertension: Secondary | ICD-10-CM | POA: Diagnosis present

## 2019-05-09 DIAGNOSIS — R1110 Vomiting, unspecified: Secondary | ICD-10-CM | POA: Insufficient documentation

## 2019-05-09 DIAGNOSIS — E785 Hyperlipidemia, unspecified: Secondary | ICD-10-CM | POA: Diagnosis present

## 2019-05-09 LAB — CBC
HCT: 48.7 % (ref 39.0–52.0)
Hemoglobin: 15.7 g/dL (ref 13.0–17.0)
MCH: 29.1 pg (ref 26.0–34.0)
MCHC: 32.2 g/dL (ref 30.0–36.0)
MCV: 90.4 fL (ref 80.0–100.0)
Platelets: 170 10*3/uL (ref 150–400)
RBC: 5.39 MIL/uL (ref 4.22–5.81)
RDW: 14.4 % (ref 11.5–15.5)
WBC: 6.7 10*3/uL (ref 4.0–10.5)
nRBC: 0 % (ref 0.0–0.2)

## 2019-05-09 LAB — COMPREHENSIVE METABOLIC PANEL
ALT: 20 U/L (ref 0–44)
AST: 16 U/L (ref 15–41)
Albumin: 4.9 g/dL (ref 3.5–5.0)
Alkaline Phosphatase: 72 U/L (ref 38–126)
Anion gap: 15 (ref 5–15)
BUN: 28 mg/dL — ABNORMAL HIGH (ref 8–23)
CO2: 26 mmol/L (ref 22–32)
Calcium: 9.6 mg/dL (ref 8.9–10.3)
Chloride: 103 mmol/L (ref 98–111)
Creatinine, Ser: 1.03 mg/dL (ref 0.61–1.24)
GFR calc Af Amer: >60 mL/min (ref >60)
GFR calc non Af Amer: >60 mL/min (ref >60)
Glucose, Bld: 179 mg/dL — ABNORMAL HIGH (ref 70–99)
Potassium: 4.1 mmol/L (ref 3.5–5.1)
Sodium: 144 mmol/L (ref 135–145)
Total Bilirubin: 1 mg/dL (ref 0.3–1.2)
Total Protein: 7.9 g/dL (ref 6.5–8.1)

## 2019-05-09 LAB — LIPASE, BLOOD: Lipase: 27 U/L (ref 11–51)

## 2019-05-09 LAB — RESPIRATORY PANEL BY RT PCR (FLU A&B, COVID)
Influenza A by PCR: NEGATIVE
Influenza B by PCR: NEGATIVE
SARS Coronavirus 2 by RT PCR: NEGATIVE

## 2019-05-09 LAB — TROPONIN I (HIGH SENSITIVITY)
Troponin I (High Sensitivity): 5 ng/L (ref <18)
Troponin I (High Sensitivity): 5 ng/L (ref <18)

## 2019-05-09 MED ORDER — ONDANSETRON HCL 4 MG/2ML IJ SOLN
4.0000 mg | Freq: Once | INTRAMUSCULAR | Status: AC
  Administered 2019-05-09: 20:00:00 4 mg via INTRAVENOUS
  Filled 2019-05-09: qty 2

## 2019-05-09 MED ORDER — LIDOCAINE VISCOUS HCL 2 % MT SOLN
15.0000 mL | Freq: Once | OROMUCOSAL | Status: AC
  Administered 2019-05-09: 15 mL via ORAL
  Filled 2019-05-09: qty 15

## 2019-05-09 MED ORDER — SODIUM CHLORIDE 0.9 % IV BOLUS
1000.0000 mL | Freq: Once | INTRAVENOUS | Status: AC
  Administered 2019-05-09: 1000 mL via INTRAVENOUS

## 2019-05-09 MED ORDER — PANTOPRAZOLE SODIUM 40 MG IV SOLR
40.0000 mg | Freq: Once | INTRAVENOUS | Status: AC
  Administered 2019-05-09: 20:00:00 40 mg via INTRAVENOUS
  Filled 2019-05-09: qty 40

## 2019-05-09 MED ORDER — ALUM & MAG HYDROXIDE-SIMETH 200-200-20 MG/5ML PO SUSP
30.0000 mL | Freq: Once | ORAL | Status: AC
  Administered 2019-05-09: 23:00:00 30 mL via ORAL
  Filled 2019-05-09: qty 30

## 2019-05-09 MED ORDER — IOHEXOL 300 MG/ML  SOLN
100.0000 mL | Freq: Once | INTRAMUSCULAR | Status: AC | PRN
  Administered 2019-05-09: 100 mL via INTRAVENOUS

## 2019-05-09 NOTE — ED Notes (Signed)
Pt vomited all of water taken by PO. EDP made aware.

## 2019-05-09 NOTE — H&P (Signed)
History and Physical    Patient Demographics:    Kurt Duran [id number] DOB: 01/14/1946 DOA: 05/09/2019  PCP: Glenda Chroman, MD  Patient coming from: Home  I have personally briefly reviewed patient's old medical records in [place]  Chief Complaint: Vomiting   Assessment & Plan:     Assessment/Plan Principal Problem:   Intractable vomiting Active Problems:   COPD (chronic obstructive pulmonary disease) ([place])   DM2 (diabetes mellitus, type 2) (H[facility])   GERD (gastroesophageal reflux disease)   HTN (hypertension)   Gout     Principal Problem: Intractable vomiting Patient presented with worsening acid reflux and persistent and intractable nausea and vomiting.  Was scheduled to follow-up with GI as an outpatient.  Unable to tolerate p.o. over the last several days.  Has had loss of weight secondary to decreased appetite.  Does not report any dysphagia but does report food and liquids return back from the upper stomach and lower esophageal region within a few minutes. CT of the abdomen shows a small hiatal hernia with lower esophageal dilation.  Etiology is likely multifactorial. Differential diagnosis include esophagitis/gastritis with concern for diabetic gastroparesis given history of longstanding diabetes.  Also has a hiatal hernia which is likely contributing to the symptoms. -Place under observation -We will place on PPI, famotidine -Antiemetics, Maalox as needed -GI consult in a.m. for endoscopy  Other Active Problems: Hiatal hernia / Gastroesophageal flux disease: -PPI, famotidine as above  COPD: Not in acute exacerbation. -Continue albuterol as needed  Diabetes mellitus type 2: With retinopathy -Hold Metformin, glipizide -Sliding scale insulin coverage with fingerstick monitoring  Hyperlipidemia: -Continue Lipitor, omega-3 fatty acids  Depression / Anxiety: -Continue Xanax, fluoxetine   DVT prophylaxis: Lovenox Code Status:  Full code Family  Communication: N/A  Disposition Plan: Place in observation for intractable vomiting possibly secondary to esophagitis, concern for food impaction Consults called: N/A Admission status: Observation stay    HPI:     HPI: Kurt Duran is a 74 y.o. male with medical history significant of COPD, diabetes mellitus type 2, hypertension, hyperlipidemia, gout, depression, anxiety who presented to the ER with vomiting.  As per patient and family, his symptoms started about 4 weeks ago with epigastric burning, nausea and intermittent vomiting.  Patient was placed on oral pantoprazole as outpatient with no significant improvement in symptoms.  He has had persistent vomiting, decreased ability to tolerate p.o., and persistent reflux, burning sensation in throat and epigastric region.  Symptoms have worsened over the last several days.  He had significant loss of weight related to decreased intake.  Was scheduled to see GI as outpatient on 11th February however he continued to have symptoms and decided to come to the ER.  Has had no significant dysphagia but does report that liquids and food tend to come back from upper stomach and lower esophageal region.  Does not feel like any food is stuck in the lower esophagus.  Has had no fever, chills, no abdominal discomfort, no diarrhea. ED Course:  Vital Signs reviewed on presentation, significant for temperature 98.4, heart rate 83, blood pressure 155/82, saturation 99% on room air. Labs reviewed, significant for sodium 144, potassium 4.1, BUN 28, creatinine 1.03, LFTs within normal limits, troponin negative x2, WBC count 6.7, hemoglobin 15.7, hematocrit 48, platelets 170. Imaging personally Reviewed, CT of the chest, abdomen, pelvis shows dilatation of esophagus measuring 2.7 x 1.8 cm with a small hiatal hernia.  Multilevel degenerative changes throughout the thoracic spine. No other  acute cardiac, pulmonary, intra-abdominal abnormality.  Diverticulosis of descending  colon without evidence of diverticulitis. EKG personally reviewed, shows sinus rhythm, no acute ST-T changes.    Review of systems:    Review of Systems: As per HPI otherwise 10 point review of systems negative.  All other review of systems is negative except the ones noted above in the HPI.    Past Medical and Surgical History:  Reviewed by me  Past Medical History:  Diagnosis Date  . Anxiety   . COPD (chronic obstructive pulmonary disease) ([place])   . Diabetes mellitus without complication ([place])   . Diabetic retinopathy ([place])    NPDR OU  . GERD (gastroesophageal reflux disease)   . Gout   . Hypertension   . Hypertensive retinopathy    OU    Past Surgical History:  Procedure Laterality Date  . CATARACT EXTRACTION W/PHACO Left 04/03/2016   Procedure: CATARACT EXTRACTION PHACO AND INTRAOCULAR LENS PLACEMENT LEFT EYE CDE= 11.33;  Surgeon: Tonny Branch, MD;  Location: [facility];  Service: Ophthalmology;  Laterality: Left;  left  . CATARACT EXTRACTION W/PHACO Right 04/28/2016   Procedure: CATARACT EXTRACTION PHACO AND INTRAOCULAR LENS PLACEMENT (IOC);  Surgeon: Tonny Branch, MD;  Location: [facility];  Service: Ophthalmology;  Laterality: Right;  cde-10.23   . EYE SURGERY Bilateral    Cat Sx OU  . KNEE ARTHROSCOPY Right   . NECK SURGERY    . ROTATOR CUFF REPAIR Right      Social History:  Reviewed by me   reports that he quit smoking about 30 years ago. His smoking use included cigarettes. He has never used smokeless tobacco. He reports that he does not drink alcohol or use drugs.  Allergies:    Allergies  Allergen Reactions  . Eggs Or Egg-Derived Products Nausea And Vomiting    Family History :   Family History  Adopted: Yes   Family history reviewed, noted as above, not pertinent to current presentation.   Home Medications:    Prior to Admission medications   Medication Sig Start Date End Date Taking? Authorizing Provider  albuterol (PROVENTIL HFA;VENTOLIN HFA) 108 (90  Base) MCG/ACT inhaler Inhale 2 puffs into the lungs every 4 (four) hours as needed for wheezing or shortness of breath.   Yes [provider]  albuterol (PROVENTIL) (2.5 MG/3ML) 0.083% nebulizer solution Take 2.5 mg by nebulization every 6 (six) hours as needed for wheezing or shortness of breath.   Yes [provider]  ALPRAZolam Duanne Moron) 0.5 MG tablet Take 0.5 mg by mouth 2 (two) times daily.  03/10/16  Yes [provider]  atorvastatin (LIPITOR) 40 MG tablet Take 40 mg by mouth every evening. for cholesterol 01/08/19  Yes [provider]  famotidine (PEPCID) 20 MG tablet Take 20 mg by mouth daily.   Yes [provider]  FARXIGA 10 MG TABS tablet Take 10 mg by mouth daily. 02/11/19  Yes [provider]  FLUoxetine (PROZAC) 20 MG capsule Take 20 mg by mouth daily. 03/04/16  Yes [provider]  gabapentin (NEURONTIN) 100 MG capsule Take 100-200 mg by mouth See admin instructions. 100mg  in the morning and 200mg  at bedtime 02/02/19  Yes [provider]  glipiZIDE (GLUCOTROL XL) 10 MG 24 hr tablet Take 10 mg by mouth 2 (two) times daily. 01/17/16  Yes [provider]  metFORMIN (GLUCOPHAGE) 1000 MG tablet Take 1,000 mg by mouth 2 (two) times daily.  02/16/16  Yes [provider]  Multiple Minerals ([place])  TABS Take 1 tablet by mouth 2 (two) times daily.   Yes [provider]  Omega-3 Fatty Acids (FISH OIL) 1000 MG CAPS Take 2 capsules by mouth 2 (two) times daily.   Yes [provider]  pantoprazole (PROTONIX) 40 MG tablet Take 40 mg by mouth daily.   Yes [provider]  terazosin (HYTRIN) 10 MG capsule Take 10 mg by mouth daily. 02/16/16  Yes [provider]    Physical Exam:    Physical Exam: Vitals:   05/09/19 2200 05/09/19 2215 05/09/19 2230 05/09/19 2245  BP: (!) 152/98  (!) 155/82   Pulse: 80 78 89 83  Resp: 18 15 (!) 21 15  Temp:      TempSrc:        SpO2: 100% 99% 100% 99%  Weight:      Height:        Constitutional: NAD, calm, comfortable Vitals:   05/09/19 2200 05/09/19 2215 05/09/19 2230 05/09/19 2245  BP: (!) 152/98  (!) 155/82   Pulse: 80 78 89 83  Resp: 18 15 (!) 21 15  Temp:      TempSrc:      SpO2: 100% 99% 100% 99%  Weight:      Height:       Eyes: PERRL, lids and conjunctivae normal ENMT: Mucous membranes are moist. Posterior pharynx clear of any exudate or lesions.Normal dentition.  Neck: normal, supple, no masses, no thyromegaly Respiratory: clear to auscultation bilaterally, no wheezing, no crackles. Normal respiratory effort. No accessory muscle use.  Cardiovascular: Regular rate and rhythm, no murmurs / rubs / gallops. No extremity edema. 2+ pedal pulses. No carotid bruits.  Abdomen: no tenderness, no masses palpated. No hepatosplenomegaly. Bowel sounds positive.  Musculoskeletal: no clubbing / cyanosis. No joint deformity upper and lower extremities. Good ROM, no contractures. Normal muscle tone.  Skin: no rashes, lesions, ulcers. No induration Neurologic: CN 2-12 grossly intact. Sensation intact, DTR normal. Strength 5/5 in all 4.  Psychiatric: Normal judgment and insight. Alert and oriented x 3. Normal mood.    Decubitus Ulcers: Not present on admission Catheters and tubes: None  Data Review:    Labs on Admission: I have personally reviewed following labs and imaging studies  CBC: Recent Labs  Lab 05/09/19 1923  WBC 6.7  HGB 15.7  HCT 48.7  MCV 90.4  PLT 123XX123   Basic Metabolic Panel: Recent Labs  Lab 05/09/19 1923  NA 144  K 4.1  CL 103  CO2 26  GLUCOSE 179*  BUN 28*  CREATININE 1.03  CALCIUM 9.6   GFR: Estimated Creatinine Clearance: 74.3 mL/min (by C-G formula based on SCr of 1.03 mg/dL). Liver Function Tests: Recent Labs  Lab 05/09/19 1923  AST 16  ALT 20  ALKPHOS 72  BILITOT 1.0  PROT 7.9  ALBUMIN 4.9   Recent Labs  Lab 05/09/19 1923  LIPASE 27   No results for  input(s): AMMONIA in the last 168 hours. Coagulation Profile: No results for input(s): INR, PROTIME in the last 168 hours. Cardiac Enzymes: No results for input(s): CKTOTAL, CKMB, CKMBINDEX, TROPONINI in the last 168 hours. BNP (last 3 results) No results for input(s): PROBNP in the last 8760 hours. HbA1C: No results for input(s): HGBA1C in the last 72 hours. CBG: No results for input(s): GLUCAP in the last 168 hours. Lipid Profile: No results for input(s): CHOL, HDL, LDLCALC, TRIG, CHOLHDL, LDLDIRECT in the last 72 hours. Thyroid Function Tests: No results for input(s): TSH, T4TOTAL,  FREET4, T3FREE, THYROIDAB in the last 72 hours. Anemia Panel: No results for input(s): VITAMINB12, FOLATE, FERRITIN, TIBC, IRON, RETICCTPCT in the last 72 hours. Urine analysis: No results found for: COLORURINE, APPEARANCEUR, [place], [place], GLUCOSEU, HGBUR, BILIRUBINUR, KETONESUR, PROTEINUR, UROBILINOGEN, NITRITE, LEUKOCYTESUR   Imaging Results:      Radiological Exams on Admission: CT Chest W Contrast  Result Date: 05/09/2019 CLINICAL DATA:  Nausea and vomiting. EXAM: CT CHEST, ABDOMEN, AND PELVIS WITH CONTRAST TECHNIQUE: Multidetector CT imaging of the chest, abdomen and pelvis was performed following the standard protocol during bolus administration of intravenous contrast. CONTRAST:  177mL OMNIPAQUE IOHEXOL 300 MG/ML  SOLN COMPARISON:  Abdomen pelvis CT, dated January 14, 2007, is available for comparison. FINDINGS: CT CHEST FINDINGS Cardiovascular: There is moderate severity calcification of the thoracic aorta. Normal heart size. No pericardial effusion. Marked severity coronary artery calcification is seen. Mediastinum/Nodes: No enlarged mediastinal, hilar, or axillary lymph nodes. Small cystic structures are seen within both lobes of the thyroid gland. Dilatation of the esophagus is seen which measures approximately 2.7 cm x 1.8 cm. A small hiatal hernia is noted. Lungs/Pleura: Lungs are clear. No  pleural effusion or pneumothorax. Musculoskeletal: Multilevel degenerative changes seen throughout the thoracic spine. CT ABDOMEN PELVIS FINDINGS Hepatobiliary: No focal liver abnormality is seen. No gallstones, gallbladder wall thickening, or biliary dilatation. Pancreas: Unremarkable. No pancreatic ductal dilatation or surrounding inflammatory changes. Spleen: Normal in size without focal abnormality. Adrenals/Urinary Tract: The right adrenal gland is normal in appearance. 0.6 cm 1.0 cm fat density left adrenal masses are seen. Kidneys are normal, without renal calculi or hydronephrosis. A 5 mm cyst is seen within the posterolateral aspect of the mid right kidney. Bladder is unremarkable. Stomach/Bowel: Stomach is within normal limits. Appendix appears normal. No evidence of bowel wall thickening, distention, or inflammatory changes. Noninflamed diverticula is seen throughout the descending colon. Vascular/Lymphatic: Moderate to marked severity aortic atherosclerosis. No enlarged abdominal or pelvic lymph nodes. Reproductive: Prostate is unremarkable. Other: No abdominal wall hernia or abnormality. No abdominopelvic ascites. Musculoskeletal: A predominant stable 9 mm sclerotic focus is seen within the posterior aspect of the sacrum on the left. Degenerative changes seen throughout the lumbar spine. IMPRESSION: 1. No evidence of acute or active cardiopulmonary disease. 2. Small hiatal hernia. 3. Small left adrenal myelolipomas. 4. Noninflamed diverticula of the descending colon. Electronically Signed   By: Virgina Norfolk M.D.   On: 05/09/2019 22:07   CT ABDOMEN PELVIS W CONTRAST  Result Date: 05/09/2019 CLINICAL DATA:  Nausea and vomiting. EXAM: CT CHEST, ABDOMEN, AND PELVIS WITH CONTRAST TECHNIQUE: Multidetector CT imaging of the chest, abdomen and pelvis was performed following the standard protocol during bolus administration of intravenous contrast. CONTRAST:  158mL OMNIPAQUE IOHEXOL 300 MG/ML  SOLN  COMPARISON:  Abdomen pelvis CT, dated January 14, 2007, is available for comparison. FINDINGS: CT CHEST FINDINGS Cardiovascular: There is moderate severity calcification of the thoracic aorta. Normal heart size. No pericardial effusion. Marked severity coronary artery calcification is seen. Mediastinum/Nodes: No enlarged mediastinal, hilar, or axillary lymph nodes. Small cystic structures are seen within both lobes of the thyroid gland. Dilatation of the esophagus is seen which measures approximately 2.7 cm x 1.8 cm. A small hiatal hernia is noted. Lungs/Pleura: Lungs are clear. No pleural effusion or pneumothorax. Musculoskeletal: Multilevel degenerative changes seen throughout the thoracic spine. CT ABDOMEN PELVIS FINDINGS Hepatobiliary: No focal liver abnormality is seen. No gallstones, gallbladder wall thickening, or biliary dilatation. Pancreas: Unremarkable. No pancreatic ductal dilatation or surrounding inflammatory changes. Spleen:  Normal in size without focal abnormality. Adrenals/Urinary Tract: The right adrenal gland is normal in appearance. 0.6 cm 1.0 cm fat density left adrenal masses are seen. Kidneys are normal, without renal calculi or hydronephrosis. A 5 mm cyst is seen within the posterolateral aspect of the mid right kidney. Bladder is unremarkable. Stomach/Bowel: Stomach is within normal limits. Appendix appears normal. No evidence of bowel wall thickening, distention, or inflammatory changes. Noninflamed diverticula is seen throughout the descending colon. Vascular/Lymphatic: Moderate to marked severity aortic atherosclerosis. No enlarged abdominal or pelvic lymph nodes. Reproductive: Prostate is unremarkable. Other: No abdominal wall hernia or abnormality. No abdominopelvic ascites. Musculoskeletal: A predominant stable 9 mm sclerotic focus is seen within the posterior aspect of the sacrum on the left. Degenerative changes seen throughout the lumbar spine. IMPRESSION: 1. No evidence of acute or  active cardiopulmonary disease. 2. Small hiatal hernia. 3. Small left adrenal myelolipomas. 4. Noninflamed diverticula of the descending colon. Electronically Signed   By: Virgina Norfolk M.D.   On: 05/09/2019 22:07      [facility] Ginette Otto MD [facility]  If 7PM-7AM, please contact night-coverage   05/09/2019, 11:13 PM

## 2019-05-09 NOTE — ED Triage Notes (Signed)
Pt spouse reported pt is currently being treated for acid reflux. Pt was told to come to ED if emesis and reflux got worse. Pt has not been able to keep anything down this weekend. Pt has app with Dr. Sydell Axon on Feb 11. Pt is currently on protonix and famotidine. Pt denies pain.

## 2019-05-09 NOTE — ED Notes (Signed)
Pt to CT

## 2019-05-09 NOTE — ED Provider Notes (Signed)
[facility] Provider Note   CSN: [id number] Arrival date & time: 05/09/19  1640     History Chief Complaint  Patient presents with  . Emesis    Muscab NIXIN COUSIN is a 74 y.o. male.  Patient with a history of diabetes, COPD, acid reflux disease here with worsening acid reflux.  His wife provides some history.  States he has been having issues with acid reflux is approximately January 4.  He said burning in his chest and his throat that is fairly constant associate with intermittent nausea and vomiting.  States he been vomiting almost on a daily basis.  He has seen his PCP and placed on Protonix and Pepcid and is scheduled to see GI on February 11.  He is never seen GI in the past.  His wife reports he said difficulty worsening over the past several days with vomiting shortly after eating or drinking.  He states he is able to swallow but immediately comes back up.  Emesis is benign bloody and nonbilious.  No blood in the stool.  Still has a gallbladder and appendix. Wife is concerned that he is losing weight. He has not been able to keep anything down for the past 3 days but feels like nothing is getting stuck in his esophagus.  He denies any chest pain, shortness of breath, cough or fever.  The history is provided by the patient.  Emesis Associated symptoms: abdominal pain   Associated symptoms: no arthralgias, no cough, no headaches and no myalgias        Past Medical History:  Diagnosis Date  . Anxiety   . COPD (chronic obstructive pulmonary disease) ([place])   . Diabetes mellitus without complication ([place])   . Diabetic retinopathy ([place])    NPDR OU  . GERD (gastroesophageal reflux disease)   . Gout   . Hypertension   . Hypertensive retinopathy    OU    There are no problems to display for this patient.   Past Surgical History:  Procedure Laterality Date  . CATARACT EXTRACTION W/PHACO Left 04/03/2016   Procedure: CATARACT EXTRACTION PHACO AND INTRAOCULAR  LENS PLACEMENT LEFT EYE CDE= 11.33;  Surgeon: Tonny Branch, MD;  Location: [facility];  Service: Ophthalmology;  Laterality: Left;  left  . CATARACT EXTRACTION W/PHACO Right 04/28/2016   Procedure: CATARACT EXTRACTION PHACO AND INTRAOCULAR LENS PLACEMENT (IOC);  Surgeon: Tonny Branch, MD;  Location: [facility];  Service: Ophthalmology;  Laterality: Right;  cde-10.23   . EYE SURGERY Bilateral    Cat Sx OU  . KNEE ARTHROSCOPY Right   . NECK SURGERY    . ROTATOR CUFF REPAIR Right        Family History  Adopted: Yes    Social History   Tobacco Use  . Smoking status: Former Smoker    Types: Cigarettes    Quit date: 03/26/1989    Years since quitting: 30.1  . Smokeless tobacco: Never Used  Substance Use Topics  . Alcohol use: No  . Drug use: No    Home Medications Prior to Admission medications   Medication Sig Start Date End Date Taking? Authorizing Provider  albuterol (PROVENTIL HFA;VENTOLIN HFA) 108 (90 Base) MCG/ACT inhaler Inhale 2 puffs into the lungs every 4 (four) hours as needed for wheezing or shortness of breath.    [provider]  allopurinol (ZYLOPRIM) 300 MG tablet Take 300 mg by mouth daily. 02/04/16   [provider]  ALPRAZolam Duanne Moron) 0.5 MG tablet Take 1 tablet  by mouth 2 (two) times daily. 03/10/16   [provider]  atorvastatin (LIPITOR) 40 MG tablet Take 40 mg by mouth every evening. for cholesterol 01/08/19   [provider]  diclofenac sodium (VOLTAREN) 1 % GEL Voltaren 1 % topical gel    [provider]  FARXIGA 10 MG TABS tablet Take 10 mg by mouth daily. 02/11/19   [provider]  FLUoxetine (PROZAC) 20 MG capsule Take 20 mg by mouth daily. 03/04/16   [provider]  gabapentin (NEURONTIN) 100 MG capsule TAKE ONE CAPSULE BY MOUTH EVERY MORNING & TAKE TWO CAPSULES BY MOUTH AT BEDTIME 02/02/19   [provider]  glipiZIDE (GLUCOTROL XL) 10 MG 24 hr tablet Take 10 mg by mouth 2 (two) times daily.  01/17/16   [provider]  HYDROcodone-acetaminophen (NORCO/VICODIN) 5-325 MG tablet Take 0.5-1 tablets by mouth 2 (two) times daily. Pt takes 0.5 tablet every morning and 1 tablet at bedtime 03/13/16   [provider]  JANUVIA 100 MG tablet Take 1 tablet by mouth daily. 02/17/16   [provider]  lisinopril (ZESTRIL) 20 MG tablet Take by mouth.    [provider]  lovastatin (MEVACOR) 40 MG tablet Take 40 mg by mouth daily. 02/16/16   [provider]  metFORMIN (GLUCOPHAGE) 1000 MG tablet Take 1 tablet by mouth 2 (two) times daily. 02/16/16   [provider]  Multiple Minerals (CALCIUM-MAGNESIUM-ZINC) TABS Take 1 tablet by mouth 2 (two) times daily.    [provider]  omega-3 acid ethyl esters (LOVAZA) 1 g capsule Take by mouth.    [provider]  Omega-3 Fatty Acids (FISH OIL) 1000 MG CAPS Take 2 capsules by mouth 2 (two) times daily.    [provider]  omeprazole (PRILOSEC) 20 MG capsule Take 20 mg by mouth daily. 02/16/16   [provider]  predniSONE (STERAPRED UNI-PAK 21 TAB) 5 MG (21) TBPK tablet TAKE AS DIRECTED WITH FOOD 11/30/18   [provider]  terazosin (HYTRIN) 10 MG capsule Take 10 mg by mouth daily. 02/16/16   [provider]    Allergies    Eggs or egg-derived products  Review of Systems   Review of Systems  Constitutional: Positive for activity change, appetite change and fatigue.  HENT: Negative for congestion and rhinorrhea.   Respiratory: Negative for cough, chest tightness and shortness of breath.   Cardiovascular: Negative for chest pain.  Gastrointestinal: Positive for abdominal pain, nausea and vomiting.  Genitourinary: Negative for dysuria and hematuria.  Musculoskeletal: Negative for arthralgias and myalgias.  Skin: Negative for rash.  Neurological: Positive for weakness. Negative for dizziness and headaches.   all other systems are negative except as  noted in the HPI and PMH.   Physical Exam Updated Vital Signs BP (!) 150/90 (BP Location: Right Arm)   Pulse (!) 101   Temp 98.4 F (36.9 C) (Oral)   Resp 18   Ht 6\' 1"  (1.854 m)   Wt 86.2 kg   SpO2 96%   BMI 25.07 kg/m   Physical Exam Vitals and nursing note reviewed.  Constitutional:      General: He is not in acute distress.    Appearance: He is well-developed.  HENT:     Head: Normocephalic and atraumatic.     Mouth/Throat:     Mouth: Mucous membranes are moist.     Pharynx: No oropharyngeal exudate.  Eyes:     Conjunctiva/sclera: Conjunctivae normal.     Pupils:  Pupils are equal, round, and reactive to light.  Neck:     Comments: No meningismus. Cardiovascular:     Rate and Rhythm: Normal rate and regular rhythm.     Heart sounds: Normal heart sounds. No murmur.  Pulmonary:     Effort: Pulmonary effort is normal. No respiratory distress.     Breath sounds: Normal breath sounds.  Abdominal:     Palpations: Abdomen is soft.     Tenderness: There is abdominal tenderness. There is no guarding or rebound.     Comments: Mild epigastric tenderness  Musculoskeletal:        General: No tenderness. Normal range of motion.     Cervical back: Normal range of motion and neck supple.  Skin:    General: Skin is warm.  Neurological:     Mental Status: He is alert and oriented to person, place, and time.     Cranial Nerves: No cranial nerve deficit.     Motor: No abnormal muscle tone.     Coordination: Coordination normal.     Comments:  5/5 strength throughout. CN 2-12 intact.Equal grip strength.   Psychiatric:        Behavior: Behavior normal.     ED Results / Procedures / Treatments   Labs (all labs ordered are listed, but only abnormal results are displayed) Labs Reviewed  COMPREHENSIVE METABOLIC PANEL - Abnormal; Notable for the following components:      Result Value   Glucose, Bld 179 (*)    BUN 28 (*)    All other components within normal limits    RESPIRATORY PANEL BY RT PCR (FLU A&B, COVID)  LIPASE, BLOOD  CBC  URINALYSIS, ROUTINE W REFLEX MICROSCOPIC  TROPONIN I (HIGH SENSITIVITY)  TROPONIN I (HIGH SENSITIVITY)    EKG EKG Interpretation  Date/Time:  Monday May 09 2019 19:45:23 EST Ventricular Rate:  88 PR Interval:    QRS Duration: 88 QT Interval:  354 QTC Calculation: 429 R Axis:   40 Text Interpretation: Sinus rhythm Atrial premature complex Low voltage, precordial leads Borderline repolarization abnormality Baseline wander in lead(s) V2 Nonspecific T wave abnormality No significant change was found Confirmed by Ezequiel Essex [phone]) on 05/09/2019 7:53:51 PM   Radiology CT Chest W Contrast  Result Date: 05/09/2019 CLINICAL DATA:  Nausea and vomiting. EXAM: CT CHEST, ABDOMEN, AND PELVIS WITH CONTRAST TECHNIQUE: Multidetector CT imaging of the chest, abdomen and pelvis was performed following the standard protocol during bolus administration of intravenous contrast. CONTRAST:  165mL OMNIPAQUE IOHEXOL 300 MG/ML  SOLN COMPARISON:  Abdomen pelvis CT, dated January 14, 2007, is available for comparison. FINDINGS: CT CHEST FINDINGS Cardiovascular: There is moderate severity calcification of the thoracic aorta. Normal heart size. No pericardial effusion. Marked severity coronary artery calcification is seen. Mediastinum/Nodes: No enlarged mediastinal, hilar, or axillary lymph nodes. Small cystic structures are seen within both lobes of the thyroid gland. Dilatation of the esophagus is seen which measures approximately 2.7 cm x 1.8 cm. A small hiatal hernia is noted. Lungs/Pleura: Lungs are clear. No pleural effusion or pneumothorax. Musculoskeletal: Multilevel degenerative changes seen throughout the thoracic spine. CT ABDOMEN PELVIS FINDINGS Hepatobiliary: No focal liver abnormality is seen. No gallstones, gallbladder wall thickening, or biliary dilatation. Pancreas: Unremarkable. No pancreatic ductal dilatation or surrounding  inflammatory changes. Spleen: Normal in size without focal abnormality. Adrenals/Urinary Tract: The right adrenal gland is normal in appearance. 0.6 cm 1.0 cm fat density left adrenal masses are seen. Kidneys are normal, without renal calculi or hydronephrosis.  A 5 mm cyst is seen within the posterolateral aspect of the mid right kidney. Bladder is unremarkable. Stomach/Bowel: Stomach is within normal limits. Appendix appears normal. No evidence of bowel wall thickening, distention, or inflammatory changes. Noninflamed diverticula is seen throughout the descending colon. Vascular/Lymphatic: Moderate to marked severity aortic atherosclerosis. No enlarged abdominal or pelvic lymph nodes. Reproductive: Prostate is unremarkable. Other: No abdominal wall hernia or abnormality. No abdominopelvic ascites. Musculoskeletal: A predominant stable 9 mm sclerotic focus is seen within the posterior aspect of the sacrum on the left. Degenerative changes seen throughout the lumbar spine. IMPRESSION: 1. No evidence of acute or active cardiopulmonary disease. 2. Small hiatal hernia. 3. Small left adrenal myelolipomas. 4. Noninflamed diverticula of the descending colon. Electronically Signed   By: Virgina Norfolk M.D.   On: 05/09/2019 22:07   CT ABDOMEN PELVIS W CONTRAST  Result Date: 05/09/2019 CLINICAL DATA:  Nausea and vomiting. EXAM: CT CHEST, ABDOMEN, AND PELVIS WITH CONTRAST TECHNIQUE: Multidetector CT imaging of the chest, abdomen and pelvis was performed following the standard protocol during bolus administration of intravenous contrast. CONTRAST:  170mL OMNIPAQUE IOHEXOL 300 MG/ML  SOLN COMPARISON:  Abdomen pelvis CT, dated January 14, 2007, is available for comparison. FINDINGS: CT CHEST FINDINGS Cardiovascular: There is moderate severity calcification of the thoracic aorta. Normal heart size. No pericardial effusion. Marked severity coronary artery calcification is seen. Mediastinum/Nodes: No enlarged mediastinal,  hilar, or axillary lymph nodes. Small cystic structures are seen within both lobes of the thyroid gland. Dilatation of the esophagus is seen which measures approximately 2.7 cm x 1.8 cm. A small hiatal hernia is noted. Lungs/Pleura: Lungs are clear. No pleural effusion or pneumothorax. Musculoskeletal: Multilevel degenerative changes seen throughout the thoracic spine. CT ABDOMEN PELVIS FINDINGS Hepatobiliary: No focal liver abnormality is seen. No gallstones, gallbladder wall thickening, or biliary dilatation. Pancreas: Unremarkable. No pancreatic ductal dilatation or surrounding inflammatory changes. Spleen: Normal in size without focal abnormality. Adrenals/Urinary Tract: The right adrenal gland is normal in appearance. 0.6 cm 1.0 cm fat density left adrenal masses are seen. Kidneys are normal, without renal calculi or hydronephrosis. A 5 mm cyst is seen within the posterolateral aspect of the mid right kidney. Bladder is unremarkable. Stomach/Bowel: Stomach is within normal limits. Appendix appears normal. No evidence of bowel wall thickening, distention, or inflammatory changes. Noninflamed diverticula is seen throughout the descending colon. Vascular/Lymphatic: Moderate to marked severity aortic atherosclerosis. No enlarged abdominal or pelvic lymph nodes. Reproductive: Prostate is unremarkable. Other: No abdominal wall hernia or abnormality. No abdominopelvic ascites. Musculoskeletal: A predominant stable 9 mm sclerotic focus is seen within the posterior aspect of the sacrum on the left. Degenerative changes seen throughout the lumbar spine. IMPRESSION: 1. No evidence of acute or active cardiopulmonary disease. 2. Small hiatal hernia. 3. Small left adrenal myelolipomas. 4. Noninflamed diverticula of the descending colon. Electronically Signed   By: Virgina Norfolk M.D.   On: 05/09/2019 22:07    Procedures Procedures (including critical care time)  Medications Ordered in ED Medications  sodium  chloride 0.9 % bolus 1,000 mL (has no administration in time range)  ondansetron (ZOFRAN) injection 4 mg (has no administration in time range)  pantoprazole (PROTONIX) injection 40 mg (has no administration in time range)    ED Course  I have reviewed the triage vital signs and the nursing notes.  Pertinent labs & imaging results that were available during my care of the patient were reviewed by me and considered in my medical decision making (see chart  for details).    MDM Rules/Calculators/A&P                      Patient with worsening acid reflux nausea and vomiting.  His abdomen is soft and nontender.  We will obtain basic labs, hydrate, symptom control  Labs are reassuring.  EKG is sinus rhythm.  Troponin negative.  LFTs, lipase and creatinine all normal.  Low suspicion for ACS. Patient given IV hydration.  CT scan obtained to evaluate for mass or obstruction. This is negative.  Does show hiatal hernia.  Attempted p.o. challenge, patient is able to swallow some liquids but spits it back up several seconds later. Not able to tolerate p.o.  Wife states has not been able to do this for several days.  Not able to take his medications.  Discussed with gastroenterology Dr. Gala Romney.  He agrees to perform EGD in the morning. It does not appear the patient has an acute food impaction but is not able to tolerate p.o. Recommends n.p.o. tonight.  Admission discussed with Dr. Scherrie November.  SHAM CHRISTLIEB was evaluated in Emergency Department on 05/09/2019 for the symptoms described in the history of present illness. He was evaluated in the context of the global COVID-19 pandemic, which necessitated consideration that the patient might be at risk for infection with the SARS-CoV-2 virus that causes COVID-19. Institutional protocols and algorithms that pertain to the evaluation of patients at risk for COVID-19 are in a state of rapid change based on information released by regulatory bodies including  the CDC and federal and state organizations. These policies and algorithms were followed during the patient's care in the ED.  Final Clinical Impression(s) / ED Diagnoses Final diagnoses:  None    Rx / DC Orders ED Discharge Orders    None       Dajana Gehrig, Annie Main, MD 05/09/19 2247

## 2019-05-10 ENCOUNTER — Encounter (HOSPITAL_COMMUNITY)
Admission: EM | Disposition: A | Discharge status: Home Health | Payer: Self-pay | Source: Home / Self Care | Attending: Internal Medicine

## 2019-05-10 ENCOUNTER — Observation Stay (HOSPITAL_COMMUNITY): Payer: Medicare Other

## 2019-05-10 ENCOUNTER — Encounter (HOSPITAL_COMMUNITY): Payer: Self-pay | Admitting: Internal Medicine

## 2019-05-10 ENCOUNTER — Observation Stay (HOSPITAL_COMMUNITY): Payer: Medicare Other | Admitting: Anesthesiology

## 2019-05-10 DIAGNOSIS — H35039 Hypertensive retinopathy, unspecified eye: Secondary | ICD-10-CM | POA: Diagnosis not present

## 2019-05-10 DIAGNOSIS — Z6826 Body mass index (BMI) 26.0-26.9, adult: Secondary | ICD-10-CM | POA: Diagnosis not present

## 2019-05-10 DIAGNOSIS — R1310 Dysphagia, unspecified: Secondary | ICD-10-CM

## 2019-05-10 DIAGNOSIS — C155 Malignant neoplasm of lower third of esophagus: Secondary | ICD-10-CM | POA: Diagnosis present

## 2019-05-10 DIAGNOSIS — F419 Anxiety disorder, unspecified: Secondary | ICD-10-CM | POA: Diagnosis present

## 2019-05-10 DIAGNOSIS — K2970 Gastritis, unspecified, without bleeding: Secondary | ICD-10-CM | POA: Diagnosis present

## 2019-05-10 DIAGNOSIS — D490 Neoplasm of unspecified behavior of digestive system: Secondary | ICD-10-CM

## 2019-05-10 DIAGNOSIS — J449 Chronic obstructive pulmonary disease, unspecified: Secondary | ICD-10-CM | POA: Diagnosis present

## 2019-05-10 DIAGNOSIS — I10 Essential (primary) hypertension: Secondary | ICD-10-CM | POA: Diagnosis present

## 2019-05-10 DIAGNOSIS — Z934 Other artificial openings of gastrointestinal tract status: Secondary | ICD-10-CM | POA: Diagnosis not present

## 2019-05-10 DIAGNOSIS — Z452 Encounter for adjustment and management of vascular access device: Secondary | ICD-10-CM | POA: Diagnosis not present

## 2019-05-10 DIAGNOSIS — Z79899 Other long term (current) drug therapy: Secondary | ICD-10-CM | POA: Diagnosis not present

## 2019-05-10 DIAGNOSIS — H35033 Hypertensive retinopathy, bilateral: Secondary | ICD-10-CM | POA: Diagnosis present

## 2019-05-10 DIAGNOSIS — E876 Hypokalemia: Secondary | ICD-10-CM | POA: Diagnosis not present

## 2019-05-10 DIAGNOSIS — I350 Nonrheumatic aortic (valve) stenosis: Secondary | ICD-10-CM

## 2019-05-10 DIAGNOSIS — R1111 Vomiting without nausea: Secondary | ICD-10-CM

## 2019-05-10 DIAGNOSIS — Z20822 Contact with and (suspected) exposure to covid-19: Secondary | ICD-10-CM | POA: Diagnosis present

## 2019-05-10 DIAGNOSIS — K5900 Constipation, unspecified: Secondary | ICD-10-CM | POA: Diagnosis present

## 2019-05-10 DIAGNOSIS — Z87891 Personal history of nicotine dependence: Secondary | ICD-10-CM | POA: Diagnosis not present

## 2019-05-10 DIAGNOSIS — E785 Hyperlipidemia, unspecified: Secondary | ICD-10-CM | POA: Diagnosis present

## 2019-05-10 DIAGNOSIS — E11319 Type 2 diabetes mellitus with unspecified diabetic retinopathy without macular edema: Secondary | ICD-10-CM | POA: Diagnosis not present

## 2019-05-10 DIAGNOSIS — K222 Esophageal obstruction: Secondary | ICD-10-CM | POA: Diagnosis present

## 2019-05-10 DIAGNOSIS — K449 Diaphragmatic hernia without obstruction or gangrene: Secondary | ICD-10-CM | POA: Diagnosis not present

## 2019-05-10 DIAGNOSIS — Z483 Aftercare following surgery for neoplasm: Secondary | ICD-10-CM | POA: Diagnosis not present

## 2019-05-10 DIAGNOSIS — K219 Gastro-esophageal reflux disease without esophagitis: Secondary | ICD-10-CM | POA: Diagnosis present

## 2019-05-10 DIAGNOSIS — E119 Type 2 diabetes mellitus without complications: Secondary | ICD-10-CM | POA: Diagnosis not present

## 2019-05-10 DIAGNOSIS — K440 Diaphragmatic hernia with obstruction, without gangrene: Secondary | ICD-10-CM | POA: Diagnosis present

## 2019-05-10 DIAGNOSIS — K228 Other specified diseases of esophagus: Secondary | ICD-10-CM

## 2019-05-10 DIAGNOSIS — K5730 Diverticulosis of large intestine without perforation or abscess without bleeding: Secondary | ICD-10-CM | POA: Diagnosis present

## 2019-05-10 DIAGNOSIS — T18128A Food in esophagus causing other injury, initial encounter: Secondary | ICD-10-CM

## 2019-05-10 DIAGNOSIS — Z7984 Long term (current) use of oral hypoglycemic drugs: Secondary | ICD-10-CM | POA: Diagnosis not present

## 2019-05-10 DIAGNOSIS — E113293 Type 2 diabetes mellitus with mild nonproliferative diabetic retinopathy without macular edema, bilateral: Secondary | ICD-10-CM | POA: Diagnosis present

## 2019-05-10 DIAGNOSIS — C159 Malignant neoplasm of esophagus, unspecified: Secondary | ICD-10-CM | POA: Diagnosis not present

## 2019-05-10 DIAGNOSIS — K2289 Other specified disease of esophagus: Secondary | ICD-10-CM

## 2019-05-10 DIAGNOSIS — K2980 Duodenitis without bleeding: Secondary | ICD-10-CM | POA: Diagnosis present

## 2019-05-10 DIAGNOSIS — R112 Nausea with vomiting, unspecified: Secondary | ICD-10-CM | POA: Diagnosis not present

## 2019-05-10 DIAGNOSIS — F329 Major depressive disorder, single episode, unspecified: Secondary | ICD-10-CM | POA: Diagnosis present

## 2019-05-10 DIAGNOSIS — M109 Gout, unspecified: Secondary | ICD-10-CM | POA: Diagnosis present

## 2019-05-10 DIAGNOSIS — Z7952 Long term (current) use of systemic steroids: Secondary | ICD-10-CM | POA: Diagnosis not present

## 2019-05-10 DIAGNOSIS — E43 Unspecified severe protein-calorie malnutrition: Secondary | ICD-10-CM | POA: Diagnosis present

## 2019-05-10 HISTORY — PX: BIOPSY: SHX5522

## 2019-05-10 HISTORY — PX: ESOPHAGOGASTRODUODENOSCOPY (EGD) WITH PROPOFOL: SHX5813

## 2019-05-10 LAB — CBC
HCT: 44 % (ref 39.0–52.0)
Hemoglobin: 14 g/dL (ref 13.0–17.0)
MCH: 28.9 pg (ref 26.0–34.0)
MCHC: 31.8 g/dL (ref 30.0–36.0)
MCV: 90.9 fL (ref 80.0–100.0)
Platelets: 147 10*3/uL — ABNORMAL LOW (ref 150–400)
RBC: 4.84 MIL/uL (ref 4.22–5.81)
RDW: 14.5 % (ref 11.5–15.5)
WBC: 5.9 10*3/uL (ref 4.0–10.5)
nRBC: 0 % (ref 0.0–0.2)

## 2019-05-10 LAB — GLUCOSE, CAPILLARY
Glucose-Capillary: 114 mg/dL — ABNORMAL HIGH (ref 70–99)
Glucose-Capillary: 121 mg/dL — ABNORMAL HIGH (ref 70–99)
Glucose-Capillary: 126 mg/dL — ABNORMAL HIGH (ref 70–99)
Glucose-Capillary: 131 mg/dL — ABNORMAL HIGH (ref 70–99)
Glucose-Capillary: 136 mg/dL — ABNORMAL HIGH (ref 70–99)

## 2019-05-10 LAB — BASIC METABOLIC PANEL
Anion gap: 12 (ref 5–15)
BUN: 23 mg/dL (ref 8–23)
CO2: 26 mmol/L (ref 22–32)
Calcium: 8.9 mg/dL (ref 8.9–10.3)
Chloride: 107 mmol/L (ref 98–111)
Creatinine, Ser: 0.9 mg/dL (ref 0.61–1.24)
GFR calc Af Amer: >60 mL/min (ref >60)
GFR calc non Af Amer: >60 mL/min (ref >60)
Glucose, Bld: 147 mg/dL — ABNORMAL HIGH (ref 70–99)
Potassium: 3.9 mmol/L (ref 3.5–5.1)
Sodium: 145 mmol/L (ref 135–145)

## 2019-05-10 LAB — ECHOCARDIOGRAM COMPLETE
Height: 73 in
Weight: 2938.29 oz

## 2019-05-10 SURGERY — ESOPHAGOGASTRODUODENOSCOPY (EGD) WITH PROPOFOL
Anesthesia: General

## 2019-05-10 MED ORDER — LIDOCAINE HCL (CARDIAC) PF 50 MG/5ML IV SOSY
PREFILLED_SYRINGE | INTRAVENOUS | Status: DC | PRN
  Administered 2019-05-10: 50 mg via INTRAVENOUS

## 2019-05-10 MED ORDER — HYDROMORPHONE HCL 1 MG/ML IJ SOLN: 0.2500 mg | INTRAMUSCULAR | Status: DC | PRN

## 2019-05-10 MED ORDER — LORAZEPAM 2 MG/ML IJ SOLN
1.0000 mg | Freq: Four times a day (QID) | INTRAMUSCULAR | Status: DC | PRN
  Administered 2019-05-10 – 2019-05-13 (×4): 1 mg via INTRAVENOUS
  Filled 2019-05-10 (×4): qty 1

## 2019-05-10 MED ORDER — METOCLOPRAMIDE HCL 5 MG/ML IJ SOLN: 10.0000 mg | Freq: Four times a day (QID) | INTRAMUSCULAR | Status: DC | PRN

## 2019-05-10 MED ORDER — ALUM & MAG HYDROXIDE-SIMETH 200-200-20 MG/5ML PO SUSP: 30.0000 mL | ORAL | Status: DC | PRN

## 2019-05-10 MED ORDER — PROMETHAZINE HCL 25 MG/ML IJ SOLN: 6.2500 mg | INTRAMUSCULAR | Status: DC | PRN

## 2019-05-10 MED ORDER — HYDROCODONE-ACETAMINOPHEN 7.5-325 MG PO TABS: 1.0000 | ORAL_TABLET | Freq: Once | ORAL | Status: DC | PRN

## 2019-05-10 MED ORDER — LIDOCAINE VISCOUS HCL 2 % MT SOLN
OROMUCOSAL | Status: AC
  Filled 2019-05-10: qty 15

## 2019-05-10 MED ORDER — BISACODYL 10 MG RE SUPP: 10.0000 mg | Freq: Every day | RECTAL | Status: DC | PRN

## 2019-05-10 MED ORDER — TRACE MINERALS CU-MN-SE-ZN 300-55-60-3000 MCG/ML IV SOLN: INTRAVENOUS | Status: DC

## 2019-05-10 MED ORDER — ATORVASTATIN CALCIUM 40 MG PO TABS: 40.0000 mg | ORAL_TABLET | Freq: Every evening | ORAL | Status: DC

## 2019-05-10 MED ORDER — FENTANYL CITRATE (PF) 100 MCG/2ML IJ SOLN
INTRAMUSCULAR | Status: AC
  Filled 2019-05-10: qty 2

## 2019-05-10 MED ORDER — SODIUM CHLORIDE 0.9 % IV SOLN: 250.0000 mL | INTRAVENOUS | Status: DC | PRN

## 2019-05-10 MED ORDER — LACTATED RINGERS IV SOLN: INTRAVENOUS | Status: DC

## 2019-05-10 MED ORDER — POLYETHYLENE GLYCOL 3350 17 G PO PACK: 17.0000 g | PACK | Freq: Every day | ORAL | Status: DC | PRN

## 2019-05-10 MED ORDER — SUCCINYLCHOLINE 20MG/ML (10ML) SYRINGE FOR MEDFUSION PUMP - OPTIME
INTRAMUSCULAR | Status: DC | PRN
  Administered 2019-05-10: 120 mg via INTRAVENOUS

## 2019-05-10 MED ORDER — ALBUTEROL SULFATE (2.5 MG/3ML) 0.083% IN NEBU: 2.5000 mg | INHALATION_SOLUTION | RESPIRATORY_TRACT | Status: DC | PRN

## 2019-05-10 MED ORDER — PANTOPRAZOLE SODIUM 40 MG IV SOLR
40.0000 mg | Freq: Two times a day (BID) | INTRAVENOUS | Status: DC
  Administered 2019-05-10 – 2019-05-16 (×11): 40 mg via INTRAVENOUS
  Filled 2019-05-10 (×11): qty 40

## 2019-05-10 MED ORDER — ONDANSETRON HCL 4 MG/2ML IJ SOLN
INTRAMUSCULAR | Status: DC | PRN
  Administered 2019-05-10: 4 mg via INTRAVENOUS

## 2019-05-10 MED ORDER — SODIUM CHLORIDE 0.9% FLUSH
3.0000 mL | Freq: Two times a day (BID) | INTRAVENOUS | Status: DC
  Administered 2019-05-11 – 2019-05-15 (×5): 3 mL via INTRAVENOUS

## 2019-05-10 MED ORDER — CALCIUM-MAGNESIUM-ZINC PO TABS: 1.0000 | ORAL_TABLET | Freq: Two times a day (BID) | ORAL | Status: DC

## 2019-05-10 MED ORDER — ONDANSETRON HCL 4 MG/2ML IJ SOLN: 4.0000 mg | Freq: Four times a day (QID) | INTRAMUSCULAR | Status: DC | PRN

## 2019-05-10 MED ORDER — ONDANSETRON HCL 4 MG/2ML IJ SOLN
INTRAMUSCULAR | Status: AC
  Filled 2019-05-10: qty 4

## 2019-05-10 MED ORDER — DEXTROSE-NACL 5-0.45 % IV SOLN: INTRAVENOUS | Status: DC

## 2019-05-10 MED ORDER — SODIUM CHLORIDE 0.9% FLUSH: 3.0000 mL | INTRAVENOUS | Status: DC | PRN

## 2019-05-10 MED ORDER — GABAPENTIN 100 MG PO CAPS
100.0000 mg | ORAL_CAPSULE | Freq: Every morning | ORAL | Status: DC
  Filled 2019-05-10: qty 1

## 2019-05-10 MED ORDER — ONDANSETRON HCL 4 MG PO TABS: 4.0000 mg | ORAL_TABLET | Freq: Four times a day (QID) | ORAL | Status: DC | PRN

## 2019-05-10 MED ORDER — TERAZOSIN HCL 5 MG PO CAPS
10.0000 mg | ORAL_CAPSULE | Freq: Every day | ORAL | Status: DC
  Administered 2019-05-15 – 2019-05-16 (×2): 10 mg via ORAL
  Filled 2019-05-10 (×7): qty 2

## 2019-05-10 MED ORDER — PANTOPRAZOLE SODIUM 40 MG IV SOLR
40.0000 mg | INTRAVENOUS | Status: DC
  Administered 2019-05-10: 09:00:00 40 mg via INTRAVENOUS
  Filled 2019-05-10: qty 40

## 2019-05-10 MED ORDER — ALBUTEROL SULFATE HFA 108 (90 BASE) MCG/ACT IN AERS: 2.0000 | INHALATION_SPRAY | RESPIRATORY_TRACT | Status: DC | PRN

## 2019-05-10 MED ORDER — INSULIN ASPART 100 UNIT/ML ~~LOC~~ SOLN
0.0000 [IU] | Freq: Three times a day (TID) | SUBCUTANEOUS | Status: DC
  Administered 2019-05-11 – 2019-05-12 (×3): 2 [IU] via SUBCUTANEOUS
  Administered 2019-05-13 (×2): 3 [IU] via SUBCUTANEOUS
  Administered 2019-05-14 (×2): 5 [IU] via SUBCUTANEOUS
  Administered 2019-05-14: 3 [IU] via SUBCUTANEOUS
  Administered 2019-05-15: 5 [IU] via SUBCUTANEOUS
  Administered 2019-05-15: 09:00:00 8 [IU] via SUBCUTANEOUS
  Administered 2019-05-15: 3 [IU] via SUBCUTANEOUS
  Administered 2019-05-16: 11 [IU] via SUBCUTANEOUS
  Administered 2019-05-16: 09:00:00 8 [IU] via SUBCUTANEOUS

## 2019-05-10 MED ORDER — ADULT MULTIVITAMIN W/MINERALS CH: 1.0000 | ORAL_TABLET | Freq: Every day | ORAL | Status: DC

## 2019-05-10 MED ORDER — FAMOTIDINE IN NACL 20-0.9 MG/50ML-% IV SOLN
20.0000 mg | Freq: Two times a day (BID) | INTRAVENOUS | Status: DC
  Administered 2019-05-10: 09:00:00 20 mg via INTRAVENOUS
  Filled 2019-05-10: qty 50

## 2019-05-10 MED ORDER — INSULIN ASPART 100 UNIT/ML ~~LOC~~ SOLN
0.0000 [IU] | Freq: Four times a day (QID) | SUBCUTANEOUS | Status: DC
  Administered 2019-05-10: 1 [IU] via SUBCUTANEOUS

## 2019-05-10 MED ORDER — FLUOXETINE HCL 20 MG PO CAPS
20.0000 mg | ORAL_CAPSULE | Freq: Every day | ORAL | Status: DC
  Administered 2019-05-15 – 2019-05-16 (×2): 20 mg via ORAL
  Filled 2019-05-10 (×5): qty 1

## 2019-05-10 MED ORDER — MIDAZOLAM HCL 2 MG/2ML IJ SOLN: 0.5000 mg | Freq: Once | INTRAMUSCULAR | Status: DC | PRN

## 2019-05-10 MED ORDER — OMEGA-3-ACID ETHYL ESTERS 1 G PO CAPS
1.0000 g | ORAL_CAPSULE | Freq: Two times a day (BID) | ORAL | Status: DC
  Filled 2019-05-10 (×4): qty 1

## 2019-05-10 MED ORDER — INSULIN ASPART 100 UNIT/ML ~~LOC~~ SOLN
0.0000 [IU] | Freq: Every day | SUBCUTANEOUS | Status: DC
  Administered 2019-05-13 – 2019-05-15 (×3): 2 [IU] via SUBCUTANEOUS

## 2019-05-10 MED ORDER — ALBUTEROL SULFATE (2.5 MG/3ML) 0.083% IN NEBU: 2.5000 mg | INHALATION_SOLUTION | Freq: Four times a day (QID) | RESPIRATORY_TRACT | Status: DC | PRN

## 2019-05-10 MED ORDER — LABETALOL HCL 5 MG/ML IV SOLN: 10.0000 mg | INTRAVENOUS | Status: DC | PRN

## 2019-05-10 MED ORDER — HEPARIN SODIUM (PORCINE) 5000 UNIT/ML IJ SOLN
5000.0000 [IU] | Freq: Three times a day (TID) | INTRAMUSCULAR | Status: AC
  Administered 2019-05-11 – 2019-05-13 (×6): 5000 [IU] via SUBCUTANEOUS
  Filled 2019-05-10 (×6): qty 1

## 2019-05-10 MED ORDER — ALPRAZOLAM 0.5 MG PO TABS
0.5000 mg | ORAL_TABLET | Freq: Two times a day (BID) | ORAL | Status: DC
  Administered 2019-05-14 – 2019-05-16 (×4): 0.5 mg via ORAL
  Filled 2019-05-10 (×7): qty 1

## 2019-05-10 MED ORDER — PROPOFOL 10 MG/ML IV BOLUS
INTRAVENOUS | Status: DC | PRN
  Administered 2019-05-10: 160 mg via INTRAVENOUS

## 2019-05-10 MED ORDER — ACETAMINOPHEN 325 MG PO TABS: 650.0000 mg | ORAL_TABLET | Freq: Four times a day (QID) | ORAL | Status: DC | PRN

## 2019-05-10 MED ORDER — ACETAMINOPHEN 650 MG RE SUPP: 650.0000 mg | Freq: Four times a day (QID) | RECTAL | Status: DC | PRN

## 2019-05-10 MED ORDER — ENOXAPARIN SODIUM 40 MG/0.4ML ~~LOC~~ SOLN
40.0000 mg | SUBCUTANEOUS | Status: DC
  Administered 2019-05-10: 40 mg via SUBCUTANEOUS
  Filled 2019-05-10: qty 0.4

## 2019-05-10 NOTE — Plan of Care (Signed)
DISCUSSED FINDINGS WITH PT AND WIFE. SHE IS AWARE OF MASS AND THAT IT IS LIKE MALIGNANT. THE PLAN WILL BE J TUBE FRI, ONCOLOGY CONSULT WITHIN 24 HRS, ARRANGE FOR RADIATION IN [place], AND CONSIDER SURGICAL RESECTION IN THE FUTURE IF HE RESPONDS TO CHEMO/XRT. WIFE AND PATIENT VOICED THEIR UNDERSTANDING.

## 2019-05-10 NOTE — Anesthesia Preprocedure Evaluation (Addendum)
Anesthesia Evaluation  Patient identified by MRN, date of birth, ID band Patient awake    Reviewed: Allergy & Precautions, NPO status , Patient's Chart, lab work & pertinent test results  Airway Mallampati: I  TM Distance: >3 FB Neck ROM: Full   Comment: S/p ACDF states VC/Trachea moved to Right  Dental no notable dental hx. (+) Teeth Intact   Pulmonary asthma , COPD,  COPD inhaler, former smoker,    Pulmonary exam normal breath sounds clear to auscultation       Cardiovascular Exercise Tolerance: Good hypertension, Pt. on medications negative cardio ROS Normal cardiovascular examI Rhythm:Regular Rate:Normal     Neuro/Psych Anxiety negative neurological ROS  negative psych ROS   GI/Hepatic Neg liver ROS, GERD  Medicated and Controlled,Here for EGD for intractable N/V    Endo/Other  negative endocrine ROSdiabetes  Renal/GU negative Renal ROS  negative genitourinary   Musculoskeletal negative musculoskeletal ROS (+)   Abdominal   Peds negative pediatric ROS (+)  Hematology negative hematology ROS (+)   Anesthesia Other Findings   Reproductive/Obstetrics negative OB ROS                             Anesthesia Physical Anesthesia Plan  ASA: III  Anesthesia Plan: General   Post-op Pain Management:    Induction: Intravenous  PONV Risk Score and Plan: 2 and Propofol infusion, TIVA and Treatment may vary due to age or medical condition  Airway Management Planned: Oral ETT  Additional Equipment:   Intra-op Plan:   Post-operative Plan: Extubation in OR  Informed Consent: I have reviewed the patients History and Physical, chart, labs and discussed the procedure including the risks, benefits and alternatives for the proposed anesthesia with the patient or authorized representative who has indicated his/her understanding and acceptance.     Dental advisory given  Plan Discussed  with: CRNA  Anesthesia Plan Comments: (Plan Full PPE use Plan GETA D/W PT -WTP with same after Q&A)       Anesthesia Quick Evaluation

## 2019-05-10 NOTE — Op Note (Addendum)
[facility] Patient Name: Kurt Duran Procedure Date: 05/10/2019 1:02 PM MRN: [id number] Date of Birth: Jun 22, 1945 Attending MD: Barney Drain MD, MD CSN: [id number] Age: 74 Admit Type: Inpatient Procedure:                Upper GI endoscopy WITH COLD FORCEPS BIOPSY/FOREIGN                            BODY REMOVAL Indications:              Dysphagia Providers:                Barney Drain MD, MD, Otis Peak B. Sharon Seller, RN,                            Randa Spike, Technician Referring MD:             Glenda Chroman Medicines:                Propofol per Anesthesia Complications:            No immediate complications. Estimated Blood Loss:     Estimated blood loss was minimal. Procedure:                Pre-Anesthesia Assessment:                           - Prior to the procedure, a History and Physical                            was performed, and patient medications and                            allergies were reviewed. The patient's tolerance of                            previous anesthesia was also reviewed. The risks                            and benefits of the procedure and the sedation                            options and risks were discussed with the patient.                            All questions were answered, and informed consent                            was obtained. Prior Anticoagulants: The patient has                            taken Lovenox (enoxaparin), last dose was day of                            procedure. ASA Grade Assessment: III - A patient  with severe systemic disease. After reviewing the                            risks and benefits, the patient was deemed in                            satisfactory condition to undergo the procedure.                            After obtaining informed consent, the endoscope was                            passed under direct vision. Throughout the                            procedure, the  patient's blood pressure, pulse, and                            oxygen saturations were monitored continuously. The                            [id number]) was introduced through the                            mouth, and advanced to the second part of duodenum.                            The upper GI endoscopy was performed with                            difficulty due to presence of food. The patient                            tolerated the procedure well. Scope In: 1:35:52 PM Scope Out: 1:48:06 PM Total Procedure Duration: 0 hours 12 minutes 14 seconds  Findings:      A medium-sized, ulcerating mass with bleeding and stigmata of recent       bleeding was found in the distal esophagus, 37 to 39 cm from the       incisors. The mass was partially obstructing and circumferential.       Biopsies were taken with a cold forceps for histology. EGJ 40 CM FROM       THE INCISORS      Localized moderate inflammation characterized by congestion (edema),       erosions and erythema was found in the entire examined stomach. Biopsies       were taken with a cold forceps for Helicobacter pylori testing.      Localized mild inflammation characterized by congestion (edema) and       erythema was found in the duodenal bulb.      The second portion of the duodenum was normal.      Food was found in the lower third of the esophagus. Removal of food was       accomplished VIA RETRIEVAL NET. 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                           - MODERATE Gastritis/MILD Duodenitis. Moderate Sedation:      Per Anesthesia Care Recommendation:           - Return patient to hospital ward for ongoing care.                           - STRICT NPO.                           - Continue present medications. D/C ALL PO MEDS                            THAT AREN'T MEDICALLY NECESSARY                           - Await  pathology results.                           - Refer to an oncologist/RADIATION ONCOLOGY/SURGERY                            at the next available appointment. Procedure Code(s):        --- Professional ---                           [phone], Esophagogastroduodenoscopy, flexible,                            transoral; with removal of foreign body(s)                           43239, Esophagogastroduodenoscopy, flexible,                            transoral; with biopsy, single or multiple Diagnosis Code(s):        --- Professional ---                           D49.0, Neoplasm of unspecified behavior of                            digestive system                           K29.70, Gastritis, unspecified, without bleeding                           K29.80, Duodenitis without bleeding                           R13.10, Dysphagia, unspecified CPT copyright 2019 American Medical Association. All rights reserved. The codes documented in this report are  preliminary and upon coder review may  be revised to meet current compliance requirements. Barney Drain, MD Barney Drain MD, MD 05/10/2019 2:21:23 PM This report has been signed electronically. Number of Addenda: 0

## 2019-05-10 NOTE — Anesthesia Procedure Notes (Signed)
Procedure Name: Intubation Date/Time: 05/10/2019 1:24 PM Performed by: Ollen Bowl, CRNA Pre-anesthesia Checklist: Patient identified, Patient being monitored, Timeout performed, Emergency Drugs available and Suction available Patient Re-evaluated:Patient Re-evaluated prior to induction Oxygen Delivery Method: Circle system utilized Preoxygenation: Pre-oxygenation with 100% oxygen Induction Type: IV induction Ventilation: Mask ventilation without difficulty Laryngoscope Size: Mac and 3 Grade View: Grade I Tube type: Oral Tube size: 7.0 mm Number of attempts: 1 Airway Equipment and Method: Stylet Placement Confirmation: ETT inserted through vocal cords under direct vision,  positive ETCO2 and breath sounds checked- equal and bilateral Secured at: 21 cm Tube secured with: Tape Dental Injury: Teeth and Oropharynx as per pre-operative assessment

## 2019-05-10 NOTE — Progress Notes (Signed)
Pt upright in the bed, alert, no s/s of distress. Able to make needs known.No complaints voiced. wife at bedside. Call light in reach will continue to monitor.

## 2019-05-10 NOTE — Progress Notes (Signed)
Patient Demographics:    Kurt Duran, is a 74 y.o. male, DOB - April 12, 1945, [id number]  Admit date - 05/09/2019   Admitting Physician Lynetta Mare, MD  Outpatient Primary MD for the patient is Glenda Chroman, MD  LOS - 0   Chief Complaint  Patient presents with  . Emesis        Subjective:    Kurt Duran today has no fevers,    No chest pain,   -Complains of fatigue due to persistent emesis -  Assessment  & Plan :    Principal Problem:   Esophageal mass-Lower 3rd Active Problems:   Intractable vomiting with nausea   COPD (chronic obstructive pulmonary disease) (HCC)   DM2 (diabetes mellitus, type 2) (HCC)   GERD (gastroesophageal reflux disease)   HTN (hypertension)   Gout  Brief Summary:- 74 y.o. 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 Delton Coombes -Discussed with gastroenterologist and endoscopist Dr. Oneida Alar -Discussed with general surgeon Dr. Arnoldo Morale -Plan is for open J-tube procedure to allow for feeding -Chemo/radiation and then possibly down the road resection/surgery of the esophageal mass - 2)COPD--no acute exacerbation at this time, continue bronchodilators  3)DM2-   No recent A1c available, -Given poor oral intake, hold Metformin and glipizide Use Novolog/Humalog Sliding scale insulin with Accu-Cheks/Fingersticks as ordered   4)DEpression/Anxiety--stable at this time, okay to use IV lorazepam as needed severe anxiety given diagnosis of possible malignancy  5)FEN--patient unable to tolerate oral intake, give TPN/Clinimix until  th  Disposition/Need for in-Hospital Stay- patient unable to be discharged at this time due to --intractable emesis, need for IV fluids/IV nutrition pending further GI/oncology/surgery work-up* --Anticipate discharge home after work-up for esophageal malignancy is completed and nutritional issues resolved  Code Status :   Family Communication:   (patient is alert, awake and coherent) Discussed with wife   Consults  :  Gi/Oncology/gen surgery  DVT Prophylaxis  :  TEDs/SCDs   Lab Results  Component Value Date   PLT 147 (L) 05/10/2019    Inpatient Medications  Scheduled Meds: . ALPRAZolam  0.5 mg Oral BID  . FLUoxetine  20 mg Oral Daily  . insulin aspart  0-9 Units Subcutaneous Q6H  . pantoprazole (PROTONIX) IV  40 mg Intravenous BID  . sodium chloride flush  3 mL Intravenous Q12H  . terazosin  10 mg Oral Daily   Continuous Infusions: . sodium chloride    . dextrose 5 % and 0.45% NaCl 75 mL/hr at 05/10/19 1607   PRN Meds:.sodium chloride, acetaminophen **OR** acetaminophen, albuterol, alum & mag hydroxide-simeth, bisacodyl, labetalol, metoCLOPramide (REGLAN) injection, ondansetron **OR** ondansetron (ZOFRAN) IV, polyethylene glycol, sodium chloride flush    Anti-infectives (From admission, onward)   None        Objective:   Vitals:   05/10/19 1415 05/10/19 1426 05/10/19 1430 05/10/19 1538  BP: (!) 155/91 (!) 161/84    Pulse: 84 81 79 75  Resp: 14 15 19    Temp:  98 F (  36.7 C)    TempSrc:      SpO2: 99% 98% 98% 98%  Weight:      Height:        Wt Readings from Last 3 Encounters:  05/10/19 83.3 kg  04/03/16 102.1 kg     Intake/Output Summary (Last 24 hours) at 05/10/2019 1752 Last data filed at 05/10/2019 1404 Gross per 24 hour  Intake 1401.63 ml  Output 0 ml  Net 1401.63 ml     Physical Exam  Gen:- Awake Alert,  In no apparent distress  HEENT:- Flasher.AT, No sclera icterus Neck-Supple Neck,No JVD,.  Lungs-  CTAB , fair symmetrical air movement CV- S1,  S2 normal, regular  Abd-  +ve B.Sounds, Abd Soft, No tenderness,    Extremity/Skin:- No  edema, pedal pulses present  Psych-affect is appropriate, oriented x3 Neuro-no new focal deficits, no tremors   Data Review:   Micro Results Recent Results (from the past 240 hour(s))  Respiratory Panel by RT PCR (Flu A&B, Covid) - Nasopharyngeal Swab     Status: None   Collection Time: 05/09/19 10:37 PM   Specimen: Nasopharyngeal Swab  Result Value Ref Range Status   SARS Coronavirus 2 by RT PCR NEGATIVE NEGATIVE Final    Comment: (NOTE) SARS-CoV-2 target nucleic acids are NOT DETECTED. The SARS-CoV-2 RNA is generally detectable in upper respiratoy specimens during the acute phase of infection. The lowest concentration of SARS-CoV-2 viral copies this assay can detect is 131 copies/mL. A negative result does not preclude SARS-Cov-2 infection and should not be used as the sole basis for treatment or other patient management decisions. A negative result may occur with  improper specimen collection/handling, submission of specimen other than nasopharyngeal swab, presence of viral mutation(s) within the areas targeted by this assay, and inadequate number of viral copies (<131 copies/mL). A negative result must be combined with clinical observations, patient history, and epidemiological information. The expected result is Negative. Fact Sheet for Patients:  PinkCheek.be Fact Sheet for Healthcare Providers:  GravelBags.it This test is not yet ap proved or cleared by the [place] FDA and  has been authorized for detection and/or diagnosis of SARS-CoV-2 by FDA under an Emergency Use Authorization (EUA). This EUA will remain  in effect (meaning this test can be used) for the duration of the COVID-19 declaration under Section 564(b)(1) of the Act, 21 U.S.C. section 360bbb-3(b)(1), unless the authorization is terminated or revoked sooner.     Influenza A by PCR NEGATIVE NEGATIVE Final   Influenza B by PCR NEGATIVE NEGATIVE Final    Comment: (NOTE) The Xpert Xpress SARS-CoV-2/FLU/RSV assay is intended as an aid in  the diagnosis of influenza from Nasopharyngeal swab specimens and  should not be used as a sole basis for treatment. Nasal washings and  aspirates are unacceptable for Xpert Xpress SARS-CoV-2/FLU/RSV  testing. Fact Sheet for Patients: PinkCheek.be Fact Sheet for Healthcare Providers: GravelBags.it This test is not yet approved or cleared by the [place] FDA and  has been authorized for detection and/or diagnosis of SARS-CoV-2 by  FDA under an Emergency Use Authorization (EUA). This EUA will remain  in effect (meaning this test can be used) for the duration of the  Covid-19 declaration under Section 564(b)(1) of the Act, 21  U.S.C. section 360bbb-3(b)(1), unless the authorization is  terminated or revoked. Performed at [facility], [address] [place], [place] [address]     Radiology Reports CT Chest W Contrast  Result Date: 05/09/2019 CLINICAL DATA:  Nausea and  vomiting. EXAM: CT CHEST, ABDOMEN, AND PELVIS WITH CONTRAST TECHNIQUE: Multidetector CT imaging of the chest, abdomen and pelvis was performed following the standard protocol during bolus administration of intravenous contrast. CONTRAST:  135mL OMNIPAQUE IOHEXOL 300 MG/ML  SOLN COMPARISON:  Abdomen pelvis CT, dated January 14, 2007, is available for comparison. FINDINGS: CT CHEST FINDINGS Cardiovascular: There is moderate severity calcification of the thoracic aorta. Normal heart size. No pericardial effusion. Marked severity coronary artery calcification is seen. Mediastinum/Nodes: No enlarged mediastinal, hilar, or axillary lymph nodes. Small cystic structures are seen within both lobes of the thyroid gland. Dilatation of the esophagus is seen which measures approximately 2.7 cm x 1.8 cm. A  small hiatal hernia is noted. Lungs/Pleura: Lungs are clear. No pleural effusion or pneumothorax. Musculoskeletal: Multilevel degenerative changes seen throughout the thoracic spine. CT ABDOMEN PELVIS FINDINGS Hepatobiliary: No focal liver abnormality is seen. No gallstones, gallbladder wall thickening, or biliary dilatation. Pancreas: Unremarkable. No pancreatic ductal dilatation or surrounding inflammatory changes. Spleen: Normal in size without focal abnormality. Adrenals/Urinary Tract: The right adrenal gland is normal in appearance. 0.6 cm 1.0 cm fat density left adrenal masses are seen. Kidneys are normal, without renal calculi or hydronephrosis. A 5 mm cyst is seen within the posterolateral aspect of the mid right kidney. Bladder is unremarkable. Stomach/Bowel: Stomach is within normal limits. Appendix appears normal. No evidence of bowel wall thickening, distention, or inflammatory changes. Noninflamed diverticula is seen throughout the descending colon. Vascular/Lymphatic: Moderate to marked severity aortic atherosclerosis. No enlarged abdominal or pelvic lymph nodes. Reproductive: Prostate is unremarkable. Other: No abdominal wall hernia or abnormality. No abdominopelvic ascites. Musculoskeletal: A predominant stable 9 mm sclerotic focus is seen within the posterior aspect of the sacrum on the left. Degenerative changes seen throughout the lumbar spine. IMPRESSION: 1. No evidence of acute or active cardiopulmonary disease. 2. Small hiatal hernia. 3. Small left adrenal myelolipomas. 4. Noninflamed diverticula of the descending colon. Electronically Signed   By: Virgina Norfolk M.D.   On: 05/09/2019 22:07   CT ABDOMEN PELVIS W CONTRAST  Result Date: 05/09/2019 CLINICAL DATA:  Nausea and vomiting. EXAM: CT CHEST, ABDOMEN, AND PELVIS WITH CONTRAST TECHNIQUE: Multidetector CT imaging of the chest, abdomen and pelvis was performed following the standard protocol during bolus administration of intravenous  contrast. CONTRAST:  165mL OMNIPAQUE IOHEXOL 300 MG/ML  SOLN COMPARISON:  Abdomen pelvis CT, dated January 14, 2007, is available for comparison. FINDINGS: CT CHEST FINDINGS Cardiovascular: There is moderate severity calcification of the thoracic aorta. Normal heart size. No pericardial effusion. Marked severity coronary artery calcification is seen. Mediastinum/Nodes: No enlarged mediastinal, hilar, or axillary lymph nodes. Small cystic structures are seen within both lobes of the thyroid gland. Dilatation of the esophagus is seen which measures approximately 2.7 cm x 1.8 cm. A small hiatal hernia is noted. Lungs/Pleura: Lungs are clear. No pleural effusion or pneumothorax. Musculoskeletal: Multilevel degenerative changes seen throughout the thoracic spine. CT ABDOMEN PELVIS FINDINGS Hepatobiliary: No focal liver abnormality is seen. No gallstones, gallbladder wall thickening, or biliary dilatation. Pancreas: Unremarkable. No pancreatic ductal dilatation or surrounding inflammatory changes. Spleen: Normal in size without focal abnormality. Adrenals/Urinary Tract: The right adrenal gland is normal in appearance. 0.6 cm 1.0 cm fat density left adrenal masses are seen. Kidneys are normal, without renal calculi or hydronephrosis. A 5 mm cyst is seen within the posterolateral aspect of the mid right kidney. Bladder is unremarkable. Stomach/Bowel: Stomach is within normal limits. Appendix appears normal. No evidence of bowel wall thickening, distention,  or inflammatory changes. Noninflamed diverticula is seen throughout the descending colon. Vascular/Lymphatic: Moderate to marked severity aortic atherosclerosis. No enlarged abdominal or pelvic lymph nodes. Reproductive: Prostate is unremarkable. Other: No abdominal wall hernia or abnormality. No abdominopelvic ascites. Musculoskeletal: A predominant stable 9 mm sclerotic focus is seen within the posterior aspect of the sacrum on the left. Degenerative changes seen  throughout the lumbar spine. IMPRESSION: 1. No evidence of acute or active cardiopulmonary disease. 2. Small hiatal hernia. 3. Small left adrenal myelolipomas. 4. Noninflamed diverticula of the descending colon. Electronically Signed   By: Virgina Norfolk M.D.   On: 05/09/2019 22:07   ECHOCARDIOGRAM COMPLETE  Result Date: 05/10/2019   ECHOCARDIOGRAM REPORT   Patient Name:   Kurt Duran Date of Exam: 05/10/2019 Medical Rec #:  [id number]       Height:       73.0 in Accession #:    [id number]      Weight:       183.6 lb Date of Birth:  1945-04-18       BSA:          2.08 m Patient Age:    63 years        BP:           161/84 mmHg Patient Gender: M               HR:           81 bpm. Exam Location:  [facility] Procedure: 2D Echo, Cardiac Doppler and Color Doppler Indications:    Dyspnea 786.09 / R06.00  History:        Patient has no prior history of Echocardiogram examinations.                 COPD; Risk Factors:Hypertension and Diabetes. GERD.  Sonographer:    Alvino Chapel RCS Referring Phys: [phone] Jessilynn Taft IMPRESSIONS  1. Left ventricular ejection fraction, by visual estimation, is 60 to 65%. The left ventricle has normal function. There is mildly increased left ventricular hypertrophy.  2. Left ventricular diastolic parameters are consistent with Grade I diastolic dysfunction (impaired relaxation).  3. The left ventricle has no regional wall motion abnormalities.  4. Global right ventricle has normal systolic function.The right ventricular size is normal. No increase in right ventricular wall thickness.  5. Left atrial size was normal.  6. Right atrial size was normal.  7. Mild mitral annular calcification.  8. The mitral valve is grossly normal. Trivial mitral valve regurgitation.  9. The tricuspid valve is grossly normal. 10. The tricuspid valve is grossly normal. Tricuspid valve regurgitation is trivial. 11. The aortic valve is tricuspid. Aortic valve regurgitation is not visualized. Mild aortic  valve stenosis. 12. The pulmonic valve was not well visualized. Pulmonic valve regurgitation is not visualized. 13. The inferior vena cava is normal in size with greater than 50% respiratory variability, suggesting right atrial pressure of 3 mmHg. FINDINGS  Left Ventricle: Left ventricular ejection fraction, by visual estimation, is 60 to 65%. The left ventricle has normal function. The left ventricle has no regional wall motion abnormalities. The left ventricular internal cavity size was the left ventricle is normal in size. There is mildly increased left ventricular hypertrophy. Concentric left ventricular hypertrophy. Left ventricular diastolic parameters are consistent with Grade I diastolic dysfunction (impaired relaxation). Indeterminate filling pressures. Right Ventricle: The right ventricular size is normal. No increase in right ventricular wall thickness. Global RV systolic function is has normal systolic function. Left  Atrium: Left atrial size was normal in size. Right Atrium: Right atrial size was normal in size Pericardium: There is no evidence of pericardial effusion. Mitral Valve: The mitral valve is grossly normal. Mild mitral annular calcification. Trivial mitral valve regurgitation. Tricuspid Valve: The tricuspid valve is grossly normal. Tricuspid valve regurgitation is trivial. Aortic Valve: The aortic valve is tricuspid. . There is mild thickening of the aortic valve. Aortic valve regurgitation is not visualized. Mild aortic stenosis is present. Mild aortic valve annular calcification. There is mild thickening of the aortic valve. Aortic valve mean gradient measures 8.0 mmHg. Aortic valve peak gradient measures 16.6 mmHg. Aortic valve area, by VTI measures 1.79 cm. Pulmonic Valve: The pulmonic valve was not well visualized. Pulmonic valve regurgitation is not visualized. Pulmonic regurgitation is not visualized. Aorta: The aortic root is normal in size and structure. Venous: The inferior vena  cava is normal in size with greater than 50% respiratory variability, suggesting right atrial pressure of 3 mmHg. IAS/Shunts: No atrial level shunt detected by color flow Doppler.  LEFT VENTRICLE PLAX 2D LVIDd:         4.67 cm  Diastology LVIDs:         2.51 cm  LV e' lateral:   7.83 cm/s LV PW:         1.02 cm  LV E/e' lateral: 12.9 LV IVS:        1.11 cm  LV e' medial:    7.29 cm/s LVOT diam:     2.00 cm  LV E/e' medial:  13.9 LV SV:         78 ml LV SV Index:   37.74 LVOT Area:     3.14 cm  RIGHT VENTRICLE RV S prime:     17.80 cm/s TAPSE (M-mode): 2.3 cm LEFT ATRIUM           Index       RIGHT ATRIUM           Index LA diam:      3.30 cm 1.59 cm/m  RA Area:     20.50 cm LA Vol (A2C): 49.3 ml 23.76 ml/m RA Volume:   56.70 ml  27.33 ml/m LA Vol (A4C): 47.6 ml 22.94 ml/m  AORTIC VALVE AV Area (Vmax):    1.68 cm AV Area (Vmean):   1.85 cm AV Area (VTI):     1.79 cm AV Vmax:           204.00 cm/s AV Vmean:          131.000 cm/s AV VTI:            0.408 m AV Peak Grad:      16.6 mmHg AV Mean Grad:      8.0 mmHg LVOT Vmax:         109.00 cm/s LVOT Vmean:        77.100 cm/s LVOT VTI:          0.232 m LVOT/AV VTI ratio: 0.57  AORTA Ao Root diam: 3.40 cm MITRAL VALVE MV Area (PHT): 2.91 cm              SHUNTS MV PHT:        75.69 msec            Systemic VTI:  0.23 m MV Decel Time: 261 msec              Systemic Diam: 2.00 cm MV E velocity: 101.00 cm/s 103 cm/s MV A velocity:  110.00 cm/s 70.3 cm/s MV E/A ratio:  0.92        1.5  Kate Sable MD Electronically signed by Kate Sable MD Signature Date/Time: 05/10/2019/3:35:48 PM    Final      CBC Recent Labs  Lab 05/09/19 1923 05/10/19 0643  WBC 6.7 5.9  HGB 15.7 14.0  HCT 48.7 44.0  PLT 170 147*  MCV 90.4 90.9  MCH 29.1 28.9  MCHC 32.2 31.8  RDW 14.4 14.5    Chemistries  Recent Labs  Lab 05/09/19 1923 05/10/19 0643  NA 144 145  K 4.1 3.9  CL 103 107  CO2 26 26  GLUCOSE 179* 147*  BUN 28* 23  CREATININE 1.03 0.90  CALCIUM 9.6  8.9  AST 16  --   ALT 20  --   ALKPHOS 72  --   BILITOT 1.0  --    ------------------------------------------------------------------------------------------------------------------ No results for input(s): CHOL, HDL, LDLCALC, TRIG, CHOLHDL, LDLDIRECT in the last 72 hours.  No results found for: HGBA1C ------------------------------------------------------------------------------------------------------------------ No results for input(s): TSH, T4TOTAL, T3FREE, THYROIDAB in the last 72 hours.  Invalid input(s): FREET3 ------------------------------------------------------------------------------------------------------------------ No results for input(s): VITAMINB12, FOLATE, FERRITIN, TIBC, IRON, RETICCTPCT in the last 72 hours.  Coagulation profile No results for input(s): INR, PROTIME in the last 168 hours.  No results for input(s): DDIMER in the last 72 hours.  Cardiac Enzymes No results for input(s): CKMB, TROPONINI, MYOGLOBIN in the last 168 hours.  Invalid input(s): CK ------------------------------------------------------------------------------------------------------------------ No results found for: BNP   Roxan Hockey M.D on 05/10/2019 at 5:52 PM  Go to [email or web address] - for contact info  [facility] - Office  [phone]

## 2019-05-10 NOTE — Progress Notes (Signed)
*  PRELIMINARY RESULTS* Echocardiogram 2D Echocardiogram has been performed.  Kurt Duran 05/10/2019, 3:23 PM

## 2019-05-10 NOTE — Transfer of Care (Signed)
Immediate Anesthesia Transfer of Care Note  Patient: RONTRELL MIRO  Procedure(s) Performed: ESOPHAGOGASTRODUODENOSCOPY (EGD) WITH PROPOFOL with dilation as appropriate. (N/A ) BIOPSY  Patient Location: PACU  Anesthesia Type:General  Level of Consciousness: awake  Airway & Oxygen Therapy: Patient Spontanous Breathing  Post-op Assessment: Report given to RN  Post vital signs: Reviewed and stable  Last Vitals:  Vitals Value Taken Time  BP 145/91 05/10/19 1401  Temp    Pulse 81 05/10/19 1404  Resp 13 05/10/19 1404  SpO2 98 % 05/10/19 1404  Vitals shown include unvalidated device data.  Last Pain:  Vitals:   05/10/19 1319  TempSrc:   PainSc: 0-No pain      Patients Stated Pain Goal: 5 ([id number])  Complications: No apparent anesthesia complications

## 2019-05-10 NOTE — Consult Note (Signed)
Referring Provider: No ref. provider found Primary Care Physician:  Glenda Chroman, MD Primary Gastroenterologist:  Dr. Oneida Alar  Date of Admission: 05/09/19 Date of Consultation: 05/10/19  Reason for Consultation:  Emesis, unable to tolerate p.o. intake  HPI:  Kurt Duran is a 74 y.o. year old male with a history of HTN, diabetes, COPD, anxiety, and GERD. Recently saw PCP last week due to worsening GERD symptoms with medications changed to Protonix and Pepcid with plans to be seen in our office on 05/18/2018, but due to worsening acid reflux and vomiting, he presented to the emergency room on 05/09/2019.  Labs including CBC, CMP, lipase unremarkable except for glucose 179H and BUN 28H. CT abdomen and pelvis on 05/09/2019 noting dilation of the esophagus measuring 2.7 cm x 1.8 cm.  Small hiatal hernia noted.  Attempted po challenge in the ED. He swallowed some liquids but spit them back up several seconds later. Dr. Gala Romney was contacted who agreed for EGD in the am.   He was made NPO and continued on outpatient Protonix, famotidine, antiemetics, and maalox as needed.   Today:  Reports chronic history of reflux which has been well controlled on daily antacid medication.  Per patient, he cannot remember what he was taking.  Reports his diabetes medications were adjusted and he started having worsening reflux symptoms about 1 month ago.  Saw his PCP last Tuesday and was started on Protonix 40 mg daily and Pepcid twice daily without improvement.  Symptoms progressed to vomiting after p.o. intake.  Intermittent vomiting started last Wednesday.  On Saturday/Sunday he was no longer able to tolerate foods or liquids.  Every time he ate or drank, it would come back up within seconds.  Unable to take medications.  When he swallows, he feels the liquid or foods go down his esophagus and just sit in his lower chest.  Denies associated nausea.  No abdominal pain.  No hematemesis.  He has not had anything to eat or  drink since yesterday but has continued spitting up his saliva today.  Reports weight loss.  States he was 211 pounds when he saw his PCP and when he was weighed last night, he weighed 185 pounds.  History of constipation well controlled on daily stool softeners.  Denies bright red blood per rectum or melena.  No NSAIDs. Only tylenol.   No prior EGD. Last TCS was in [place] at [place].  Not sure when.   Does not know his family history as he was adopted.  Past Medical History:  Diagnosis Date  . Anxiety   . COPD (chronic obstructive pulmonary disease) ([place])   . Diabetes mellitus without complication ([place])   . Diabetic retinopathy ([place])    NPDR OU  . GERD (gastroesophageal reflux disease)   . Gout   . Hypertension   . Hypertensive retinopathy    OU    Past Surgical History:  Procedure Laterality Date  . CATARACT EXTRACTION W/PHACO Left 04/03/2016   Procedure: CATARACT EXTRACTION PHACO AND INTRAOCULAR LENS PLACEMENT LEFT EYE CDE= 11.33;  Surgeon: Tonny Branch, MD;  Location: [facility];  Service: Ophthalmology;  Laterality: Left;  left  . CATARACT EXTRACTION W/PHACO Right 04/28/2016   Procedure: CATARACT EXTRACTION PHACO AND INTRAOCULAR LENS PLACEMENT (IOC);  Surgeon: Tonny Branch, MD;  Location: [facility];  Service: Ophthalmology;  Laterality: Right;  cde-10.23   . EYE SURGERY Bilateral    Cat Sx OU  . KNEE ARTHROSCOPY Right   . NECK SURGERY    .  ROTATOR CUFF REPAIR Right     Prior to Admission medications   Medication Sig Start Date End Date Taking? Authorizing Provider  albuterol (PROVENTIL HFA;VENTOLIN HFA) 108 (90 Base) MCG/ACT inhaler Inhale 2 puffs into the lungs every 4 (four) hours as needed for wheezing or shortness of breath.   Yes [provider]  albuterol (PROVENTIL) (2.5 MG/3ML) 0.083% nebulizer solution Take 2.5 mg by nebulization every 6 (six) hours as needed for wheezing or shortness of breath.   Yes [provider]  ALPRAZolam Duanne Moron) 0.5 MG tablet  Take 0.5 mg by mouth 2 (two) times daily.  03/10/16  Yes [provider]  atorvastatin (LIPITOR) 40 MG tablet Take 40 mg by mouth every evening. for cholesterol 01/08/19  Yes [provider]  famotidine (PEPCID) 20 MG tablet Take 20 mg by mouth daily.   Yes [provider]  FARXIGA 10 MG TABS tablet Take 10 mg by mouth daily. 02/11/19  Yes [provider]  FLUoxetine (PROZAC) 20 MG capsule Take 20 mg by mouth daily. 03/04/16  Yes [provider]  gabapentin (NEURONTIN) 100 MG capsule Take 100-200 mg by mouth See admin instructions. 100mg  in the morning and 200mg  at bedtime 02/02/19  Yes [provider]  glipiZIDE (GLUCOTROL XL) 10 MG 24 hr tablet Take 10 mg by mouth 2 (two) times daily. 01/17/16  Yes [provider]  metFORMIN (GLUCOPHAGE) 1000 MG tablet Take 1,000 mg by mouth 2 (two) times daily.  02/16/16  Yes [provider]  Multiple Minerals (CALCIUM-MAGNESIUM-ZINC) TABS Take 1 tablet by mouth 2 (two) times daily.   Yes [provider]  Omega-3 Fatty Acids (FISH OIL) 1000 MG CAPS Take 2 capsules by mouth 2 (two) times daily.   Yes [provider]  pantoprazole (PROTONIX) 40 MG tablet Take 40 mg by mouth daily.   Yes [provider]  terazosin (HYTRIN) 10 MG capsule Take 10 mg by mouth daily. 02/16/16  Yes [provider]    Current Facility-Administered Medications  Medication Dose Route Frequency Provider Last Rate Last Admin  . 0.9 %  sodium chloride infusion  250 mL Intravenous PRN Lynetta Mare, MD      . acetaminophen (TYLENOL) tablet 650 mg  650 mg Oral Q6H PRN Lynetta Mare, MD       Or  . acetaminophen (TYLENOL) suppository 650 mg  650 mg Rectal Q6H PRN Lynetta Mare, MD      . albuterol (PROVENTIL) (2.5 MG/3ML) 0.083% nebulizer solution 2.5 mg  2.5 mg Nebulization Q4H PRN Lynetta Mare, MD      . ALPRAZolam Duanne Moron) tablet 0.5 mg  0.5 mg Oral BID Lynetta Mare, MD      . alum & mag hydroxide-simeth (MAALOX/MYLANTA) 200-200-20 MG/5ML suspension 30 mL  30 mL Oral Q4H PRN Lynetta Mare, MD      . atorvastatin (LIPITOR) tablet 40 mg  40 mg Oral QPM Lynetta Mare, MD      . bisacodyl (DULCOLAX) suppository 10 mg  10 mg Rectal Daily PRN Lynetta Mare, MD      . famotidine (PEPCID) IVPB 20 mg premix  20 mg Intravenous Q12H Lynetta Mare, MD 100 mL/hr at 05/10/19 0913 20 mg at 05/10/19 0913  . FLUoxetine (PROZAC) capsule 20 mg  20 mg Oral Daily Lynetta Mare, MD      . gabapentin (NEURONTIN) capsule 100 mg  100 mg Oral q morning - 10a Lynetta Mare,  MD      . insulin aspart (novoLOG) injection 0-9 Units  0-9 Units Subcutaneous Q6H Lynetta Mare, MD   1 Units at 05/10/19 [phone]  . lactated ringers infusion   Intravenous Continuous Lynetta Mare, MD 125 mL/hr at 05/10/19 [id number] New Bag at 05/10/19 0607  . metoCLOPramide (REGLAN) injection 10 mg  10 mg Intravenous Q6H PRN Lynetta Mare, MD      . multivitamin with minerals tablet 1 tablet  1 tablet Oral Daily Lynetta Mare, MD      . omega-3 acid ethyl esters (LOVAZA) capsule 1 g  1 g Oral BID Lynetta Mare, MD      . ondansetron [facility]) tablet 4 mg  4 mg Oral Q6H PRN Lynetta Mare, MD       Or  . ondansetron [facility]) injection 4 mg  4 mg Intravenous Q6H PRN Lynetta Mare, MD      . pantoprazole (PROTONIX) injection 40 mg  40 mg Intravenous Q24H Lynetta Mare, MD   40 mg at 05/10/19 0914  . polyethylene glycol (MIRALAX / GLYCOLAX) packet 17 g  17 g Oral Daily PRN Lynetta Mare, MD      . sodium chloride flush (NS) 0.9 % injection 3 mL  3 mL Intravenous Q12H Lynetta Mare, MD      . sodium chloride flush (NS) 0.9 % injection 3 mL  3 mL Intravenous PRN Lynetta Mare, MD      . terazosin (HYTRIN) capsule 10 mg  10 mg Oral Daily Lynetta Mare, MD        Allergies as of 05/09/2019 - Review Complete 05/09/2019  Allergen Reaction Noted  . Eggs or  egg-derived products Nausea And Vomiting 03/24/2016    Family History  Adopted: Yes    Social History   Socioeconomic History  . Marital status: Married    Spouse name: Not on file  . Number of children: Not on file  . Years of education: Not on file  . Highest education level: Not on file  Occupational History  . Not on file  Tobacco Use  . Smoking status: Former Smoker    Types: Cigarettes    Quit date: 03/26/1989    Years since quitting: 30.1  . Smokeless tobacco: Never Used  Substance and Sexual Activity  . Alcohol use: No  . Drug use: No  . Sexual activity: Not on file  Other Topics Concern  . Not on file  Social History Narrative  . Not on file   Social Determinants of Health   Financial Resource Strain:   . Difficulty of Paying Living Expenses: Not on file  Food Insecurity:   . Worried About Charity fundraiser in the Last Year: Not on file  . Ran Out of Food in the Last Year: Not on file  Transportation Needs:   . Lack of Transportation (Medical): Not on file  . Lack of Transportation (Non-Medical): Not on file  Physical Activity:   . Days of Exercise per Week: Not on file  . Minutes of Exercise per Session: Not on file  Stress:   . Feeling of Stress : Not on file  Social Connections:   . Frequency of Communication with Friends and Family: Not on file  . Frequency of Social Gatherings with Friends and Family: Not on file  . Attends Religious Services: Not on file  . Active Member of Clubs or Organizations: Not on file  .  Attends Archivist Meetings: Not on file  . Marital Status: Not on file  Intimate Partner Violence:   . Fear of Current or Ex-Partner: Not on file  . Emotionally Abused: Not on file  . Physically Abused: Not on file  . Sexually Abused: Not on file    Review of Systems: Gen: Denies fever, chills, lightheadedness, dizziness, presyncope, or syncope. CV: Denies chest pain or heart palpitations. Resp: Denies shortness of  breath or cough. GI: See HPI GU : Denies urinary burning, urinary frequency, urinary incontinence.  MS: Admits to arthritis and neuropathy.  Derm: Denies rash Psych: Admits to depression and anxiety. Medications work well.  Heme: Denies bruising or bleeding.  Physical Exam: Vital signs in last 24 hours: Temp:  [98.1 F (36.7 C)-98.4 F (36.9 C)] 98.1 F (36.7 C) (02/02 0426) Pulse Rate:  [76-101] 78 (02/02 0426) Resp:  [12-21] 20 (02/02 0426) BP: (150-176)/(78-98) 157/78 (02/02 0426) SpO2:  [96 %-100 %] 98 % (02/02 0426) Weight:  [83.3 kg-86.2 kg] 83.3 kg (02/02 0500)   General:   Alert,  Well-developed, well-nourished, pleasant and cooperative in NAD Head:  Normocephalic and atraumatic. Eyes:  Sclera clear, no icterus.   Conjunctiva pink. Ears:  Normal auditory acuity. Lungs:  Clear throughout to auscultation.   No wheezes, crackles, or rhonchi. No acute distress. Heart:  Regular rate and rhythm; no murmurs, clicks, rubs,  or gallops. Abdomen:  Soft, nontender and nondistended. No masses, hepatosplenomegaly or hernias noted. Normal bowel sounds, without guarding, and without rebound.   Rectal:  Deferred. Msk:  Symmetrical without gross deformities. Normal posture. Pulses:  Normal pulses noted. Extremities:  Without edema. Neurologic:  Alert and  oriented x4;  grossly normal neurologically. Skin:  Intact without significant lesions or rashes. Psych: Normal mood and affect.  Intake/Output from previous day: No intake/output data recorded. Intake/Output this shift: No intake/output data recorded.  Lab Results: Recent Labs    05/09/19 1923 05/10/19 0643  WBC 6.7 5.9  HGB 15.7 14.0  HCT 48.7 44.0  PLT 170 147*   BMET Recent Labs    05/09/19 1923 05/10/19 0643  NA 144 145  K 4.1 3.9  CL 103 107  CO2 26 26  GLUCOSE 179* 147*  BUN 28* 23  CREATININE 1.03 0.90  CALCIUM 9.6 8.9   LFT Recent Labs    05/09/19 1923  PROT 7.9  ALBUMIN 4.9  AST 16  ALT 20   ALKPHOS 72  BILITOT 1.0   Studies/Results: CT Chest W Contrast  Result Date: 05/09/2019 CLINICAL DATA:  Nausea and vomiting. EXAM: CT CHEST, ABDOMEN, AND PELVIS WITH CONTRAST TECHNIQUE: Multidetector CT imaging of the chest, abdomen and pelvis was performed following the standard protocol during bolus administration of intravenous contrast. CONTRAST:  114mL OMNIPAQUE IOHEXOL 300 MG/ML  SOLN COMPARISON:  Abdomen pelvis CT, dated January 14, 2007, is available for comparison. FINDINGS: CT CHEST FINDINGS Cardiovascular: There is moderate severity calcification of the thoracic aorta. Normal heart size. No pericardial effusion. Marked severity coronary artery calcification is seen. Mediastinum/Nodes: No enlarged mediastinal, hilar, or axillary lymph nodes. Small cystic structures are seen within both lobes of the thyroid gland. Dilatation of the esophagus is seen which measures approximately 2.7 cm x 1.8 cm. A small hiatal hernia is noted. Lungs/Pleura: Lungs are clear. No pleural effusion or pneumothorax. Musculoskeletal: Multilevel degenerative changes seen throughout the thoracic spine. CT ABDOMEN PELVIS FINDINGS Hepatobiliary: No focal liver abnormality is seen. No gallstones, gallbladder wall thickening, or biliary dilatation. Pancreas:  Unremarkable. No pancreatic ductal dilatation or surrounding inflammatory changes. Spleen: Normal in size without focal abnormality. Adrenals/Urinary Tract: The right adrenal gland is normal in appearance. 0.6 cm 1.0 cm fat density left adrenal masses are seen. Kidneys are normal, without renal calculi or hydronephrosis. A 5 mm cyst is seen within the posterolateral aspect of the mid right kidney. Bladder is unremarkable. Stomach/Bowel: Stomach is within normal limits. Appendix appears normal. No evidence of bowel wall thickening, distention, or inflammatory changes. Noninflamed diverticula is seen throughout the descending colon. Vascular/Lymphatic: Moderate to marked severity  aortic atherosclerosis. No enlarged abdominal or pelvic lymph nodes. Reproductive: Prostate is unremarkable. Other: No abdominal wall hernia or abnormality. No abdominopelvic ascites. Musculoskeletal: A predominant stable 9 mm sclerotic focus is seen within the posterior aspect of the sacrum on the left. Degenerative changes seen throughout the lumbar spine. IMPRESSION: 1. No evidence of acute or active cardiopulmonary disease. 2. Small hiatal hernia. 3. Small left adrenal myelolipomas. 4. Noninflamed diverticula of the descending colon. Electronically Signed   By: Virgina Norfolk M.D.   On: 05/09/2019 22:07   CT ABDOMEN PELVIS W CONTRAST  Result Date: 05/09/2019 CLINICAL DATA:  Nausea and vomiting. EXAM: CT CHEST, ABDOMEN, AND PELVIS WITH CONTRAST TECHNIQUE: Multidetector CT imaging of the chest, abdomen and pelvis was performed following the standard protocol during bolus administration of intravenous contrast. CONTRAST:  169mL OMNIPAQUE IOHEXOL 300 MG/ML  SOLN COMPARISON:  Abdomen pelvis CT, dated January 14, 2007, is available for comparison. FINDINGS: CT CHEST FINDINGS Cardiovascular: There is moderate severity calcification of the thoracic aorta. Normal heart size. No pericardial effusion. Marked severity coronary artery calcification is seen. Mediastinum/Nodes: No enlarged mediastinal, hilar, or axillary lymph nodes. Small cystic structures are seen within both lobes of the thyroid gland. Dilatation of the esophagus is seen which measures approximately 2.7 cm x 1.8 cm. A small hiatal hernia is noted. Lungs/Pleura: Lungs are clear. No pleural effusion or pneumothorax. Musculoskeletal: Multilevel degenerative changes seen throughout the thoracic spine. CT ABDOMEN PELVIS FINDINGS Hepatobiliary: No focal liver abnormality is seen. No gallstones, gallbladder wall thickening, or biliary dilatation. Pancreas: Unremarkable. No pancreatic ductal dilatation or surrounding inflammatory changes. Spleen: Normal in  size without focal abnormality. Adrenals/Urinary Tract: The right adrenal gland is normal in appearance. 0.6 cm 1.0 cm fat density left adrenal masses are seen. Kidneys are normal, without renal calculi or hydronephrosis. A 5 mm cyst is seen within the posterolateral aspect of the mid right kidney. Bladder is unremarkable. Stomach/Bowel: Stomach is within normal limits. Appendix appears normal. No evidence of bowel wall thickening, distention, or inflammatory changes. Noninflamed diverticula is seen throughout the descending colon. Vascular/Lymphatic: Moderate to marked severity aortic atherosclerosis. No enlarged abdominal or pelvic lymph nodes. Reproductive: Prostate is unremarkable. Other: No abdominal wall hernia or abnormality. No abdominopelvic ascites. Musculoskeletal: A predominant stable 9 mm sclerotic focus is seen within the posterior aspect of the sacrum on the left. Degenerative changes seen throughout the lumbar spine. IMPRESSION: 1. No evidence of acute or active cardiopulmonary disease. 2. Small hiatal hernia. 3. Small left adrenal myelolipomas. 4. Noninflamed diverticula of the descending colon. Electronically Signed   By: Virgina Norfolk M.D.   On: 05/09/2019 22:07    Impression: 74 year old male with history of HTN, diabetes, COPD, anxiety, and GERD who presented to the emergency department for worsening reflux with associated vomiting after any p.o. intake.  GERD medications changed last week to Protonix 40 mg daily and famotidine twice daily by PCP due to worsening GERD symptoms  without any improvement. At this time, he is unable to tolerate any p.o intake as he vomits within a few seconds of swallowing. Feels his foods go down his esophagus and sit in his lower chest.  Denies nausea, abdominal pain, or hematemesis.  P.o. challenge attempted in the ED unsuccessful.  Labs in the ED including CBC, CMP, lipase unremarkable except for glucose 179H and a BUN 28H.  CT abdomen and pelvis on  05/09/2019 noting dilation of the esophagus measuring 2.7 cm x 1.8 cm.  Small hiatal hernia noted.  He has been n.p.o. since last night and continues to spit up his saliva.   Differentials include lower esophageal stricture/stenosis secondary to uncontrolled reflux.  He may also have underlying gastroparesis in the setting of diabetes, esophagitis, gastritis.  Patient needs EGD for further evaluation with possible dilation as appropriate.  Of note, he did receive Lovenox at 6 AM this morning for DVT prophylaxis.  Discussed this with Dr. Oneida Alar who states she is okay to proceed with EGD.   Covid test negative.  Plan: Continue n.p.o. status for now. Proceed with EGD with dilation as appropriate with propofol Dr. Oneida Alar today. The risks, benefits, and alternatives have been discussed in detail with patient. They have stated understanding and desire to proceed.  Propofol due to outpatient medications including Xanax, Prozac, and gabapentin. Continue Protonix 40 mg IV for now.  Continue supportive measures.  Further recommendations to follow.    LOS: 0 days    05/10/2019, 10:10 AM   Aliene Altes, [facility]

## 2019-05-10 NOTE — Anesthesia Postprocedure Evaluation (Signed)
Anesthesia Post Note  Patient: Kurt Duran  Procedure(s) Performed: ESOPHAGOGASTRODUODENOSCOPY (EGD) WITH PROPOFOL with dilation as appropriate. (N/A ) BIOPSY  Patient location during evaluation: PACU Anesthesia Type: General Level of consciousness: awake and alert and oriented Pain management: pain level controlled Vital Signs Assessment: post-procedure vital signs reviewed and stable Respiratory status: spontaneous breathing Cardiovascular status: blood pressure returned to baseline and stable Postop Assessment: no apparent nausea or vomiting Anesthetic complications: no     Last Vitals:  Vitals:   05/10/19 1243 05/10/19 1401  BP: (!) 164/94 (!) 145/91  Pulse: 85   Resp: 20   Temp: 36.8 C 36.5 C  SpO2:  97%    Last Pain:  Vitals:   05/10/19 1401  TempSrc:   PainSc: 0-No pain                 Teodora Baumgarten

## 2019-05-11 ENCOUNTER — Other Ambulatory Visit: Payer: Self-pay

## 2019-05-11 DIAGNOSIS — K228 Other specified diseases of esophagus: Secondary | ICD-10-CM

## 2019-05-11 LAB — COMPREHENSIVE METABOLIC PANEL
ALT: 18 U/L (ref 0–44)
AST: 17 U/L (ref 15–41)
Albumin: 4.1 g/dL (ref 3.5–5.0)
Alkaline Phosphatase: 68 U/L (ref 38–126)
Anion gap: 12 (ref 5–15)
BUN: 18 mg/dL (ref 8–23)
CO2: 25 mmol/L (ref 22–32)
Calcium: 8.7 mg/dL — ABNORMAL LOW (ref 8.9–10.3)
Chloride: 105 mmol/L (ref 98–111)
Creatinine, Ser: 0.84 mg/dL (ref 0.61–1.24)
GFR calc Af Amer: >60 mL/min (ref >60)
GFR calc non Af Amer: >60 mL/min (ref >60)
Glucose, Bld: 143 mg/dL — ABNORMAL HIGH (ref 70–99)
Potassium: 3.4 mmol/L — ABNORMAL LOW (ref 3.5–5.1)
Sodium: 142 mmol/L (ref 135–145)
Total Bilirubin: 0.9 mg/dL (ref 0.3–1.2)
Total Protein: 6.8 g/dL (ref 6.5–8.1)

## 2019-05-11 LAB — GLUCOSE, CAPILLARY
Glucose-Capillary: 120 mg/dL — ABNORMAL HIGH (ref 70–99)
Glucose-Capillary: 128 mg/dL — ABNORMAL HIGH (ref 70–99)
Glucose-Capillary: 119 mg/dL — ABNORMAL HIGH (ref 70–99)
Glucose-Capillary: 119 mg/dL — ABNORMAL HIGH (ref 70–99)

## 2019-05-11 LAB — HEMOGLOBIN A1C
Hgb A1c MFr Bld: 7.1 % — ABNORMAL HIGH (ref 4.8–5.6)
Mean Plasma Glucose: 157.07 mg/dL

## 2019-05-11 NOTE — Plan of Care (Signed)

## 2019-05-11 NOTE — Progress Notes (Addendum)
Initial Nutrition Assessment  DOCUMENTATION CODES:   Severe malnutrition in context of chronic illness  INTERVENTION:  TPN per pharmacy  Monitor refeeding labs   Addendum: RD consulted for tube feeding recommendations pending placement of jejunostomy tube on 05/13/19.  Recommend initiating Osmolite 1.2 at 15 ml/hr, advance 15 ml every 8 hrs to goal rate 75 ml/hr  Pro-stat 30 ml daily via tube  Tube feed regimen at goal rate 75 ml/hr provides 2260 kcal, 115 grams of protein, and 1476 ml free water   NUTRITION DIAGNOSIS:   Severe Malnutrition related to chronic illness(esophageal mass in lower third of esophagus) as evidenced by per patient/family report, energy intake < 75% for > or equal to 1 month, mild fat depletion, moderate fat depletion, severe fat depletion, mild muscle depletion, moderate muscle depletion, severe muscle depletion, percent weight loss.    GOAL:   Patient will meet greater than or equal to 90% of their needs    MONITOR:   Weight trends, Labs, I & O's, TF tolerance  REASON FOR ASSESSMENT:   Malnutrition Screening Tool, Consult New TPN/TNA  ASSESSMENT:  74 year old male with past medical history of COPD, DM2, diabetic retinopathy, hypertensive retinopathy, HTN, HLD, GERD, gout, depression and anxiety who presented to ED with vomiting. Patient reports 4 week history of epigastric burning, nausea with intermittent vomiting and placed on oral pantoprazole as outpatient with no significant improvement in symptoms. Patient has had persistent vomiting,  decreased ability to tolerate oral intake, significant wt loss, as well as burning in throat and epigastric region. CT of chest, abdomen, pelvis shows dilation of esophagus with small hiatal hernia, pt admitted for intractable vomiting possibly secondary to esophagitis with concerns for food impaction.  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  RD  consulted for new TNA  Per notes: Plan for open J-tube procedure for tube feedings on 2/5 Chemo/radiation and possibly future resection/surgery of esophageal mass  Met with patient at bedside this morning. Patient reports onset of symptoms approximately 1 month ago, endorses decreased meal intake and drinking mostly water, juice, or pop. Prior to symptoms pt recalls oatmeal for breakfast and enjoyed a variety of foods for lunch and dinner including chicken, potatoes, and beans.  Patient recalls UBW around 220 lbs, and reports more recently weighing 211 lbs 2-3 months ago. Patient reports that he was surprised when told his current weight, and with a chuckle, stated that he has not weighed 185 lbs in over 30 years. Per pt report, he has lost 26 lbs (12.5%) over the past 3 months which is severe for time frame.   Given dietary recall of <75% intake of estimated needs over the past month, severe wt loss, as well as fat and muscle depletions noted on NFPE, pt meets criteria for severe malnutrition in the context of chronic disease. TNA to be initiated today and planned open J-tube procedure on Friday to begin tube feedings. Patient is at high risk for refeeding given oral intake of mostly fluids for the past month, recommend monitoring Mg, K, and Phos.   Current wt 83.9 kg (184.58 lbs) No recent wt history for review, per Care Everywhere pt 98.6 kg (217 lbs) on 12/29/2017  I/Os: +2462 ml since admit  Medications reviewed and include: SS novolog,  Protonix D5 NaCl Clinimix 5/15 with electrolytes @ 50 ml/hr   Labs: CBGs 120,131 x 24 hrs, K 3.4 (L) Lab Results  Component Value Date   HGBA1C  7.1 (H) 05/11/2019     NUTRITION - FOCUSED PHYSICAL EXAM: 2/3 Findings: Mild fat depletion to thoracic lumbar region; moderate fat depletion to orbital and buccal region; severe fat depletion to upper arm region; mild muscle depletion to temple, clavicle,  dorsal hand, and anterior thigh regions; moderate  muscle depletion to clavicle and acromion and posterior calf region; severe muscle depletion to patellar region.    Diet Order:   Diet Order            Diet NPO time specified Except for: Sips with Meds  Diet effective now              EDUCATION NEEDS:   Education needs have been addressed  Skin:  Skin Assessment: Reviewed RN Assessment  Last BM:  Unknown  Height:   Ht Readings from Last 1 Encounters:  05/09/19 _0  (1.854 m)    Weight:   Wt Readings from Last 1 Encounters:  05/11/19 83.9 kg    Ideal Body Weight:  83.6 kg  BMI:  Body mass index is 24.4 kg/m.  Estimated Nutritional Needs:   Kcal:  2150-2300 (MSJ x 1.3-1.4)  Protein:  108-115  Fluid:  >/= 2 L/day   Lajuan Lines, RD, LDN Clinical Nutrition Office Telephone [phone] After Hours/Weekend Pager: [phone]

## 2019-05-11 NOTE — Consult Note (Signed)
Reason for Consult: Placement of jejunostomy tube and Port-A-Cath Referring Physician: Dr. Jeannetta Duran is an 74 y.o. male.  HPI: Patient is a 74 year old white male who presented to [facility] with worsening dysphagia and inability to control his secretions.  He states this was progressive in nature over the past few weeks.  He also has been losing weight over the last 1 to 2 months.  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  Pathology is pending.  Dr. Delton Duran of oncology is requesting both a jejunostomy tube for treatment purposes.  Past Medical History:  Diagnosis Date  . Anxiety   . COPD (chronic obstructive pulmonary disease) ([place])   . Diabetes mellitus without complication ([place])   . Diabetic retinopathy ([place])    NPDR OU  . GERD (gastroesophageal reflux disease)   . Gout   . Hypertension   . Hypertensive retinopathy    OU    Past Surgical History:  Procedure Laterality Date  . BIOPSY  05/10/2019   Procedure: BIOPSY;  Surgeon: Kurt Binder, MD;  Location: [facility];  Service: Endoscopy;;  gastric esophageal  . CATARACT EXTRACTION W/PHACO Left 04/03/2016   Procedure: CATARACT EXTRACTION PHACO AND INTRAOCULAR LENS PLACEMENT LEFT EYE CDE= 11.33;  Surgeon: Kurt Branch, MD;  Location: [facility] ORS;  Service: Ophthalmology;  Laterality: Left;  left  . CATARACT EXTRACTION W/PHACO Right 04/28/2016   Procedure: CATARACT EXTRACTION PHACO AND INTRAOCULAR LENS PLACEMENT (IOC);  Surgeon: Kurt Branch, MD;  Location: [facility] OR[facility];  Service: Ophthalmology;  Laterality: Right;  [id number]   . ESOPHAGOGASTRODUODENOSCOPY (EGD) WITH PROPOFOL N/A 05/10/2019   Procedure: ESOPHAGOGASTRODUODENOSCOPY (EGD) WITH PROPOFOL with dilation as appropriate.;  Surgeon: Kurt Binder, MD;  Location: [facility];  Service: Endoscopy;  Laterality: N/A;  . EYE SURGERY Bilateral    Cat Sx OU  . KNEE ARTHROSCOPY Right   . NECK SURGERY    . ROTATOR CUFF  REPAIR Right     Family History  Adopted: Yes    Social History:  reports that he quit smoking about 30 years ago. His smoking use included cigarettes. He has never used smokeless tobacco. He reports that he does not drink alcohol or use drugs.  Allergies:  Allergies  Allergen Reactions  . Eggs Or Egg-Derived Products Nausea And Vomiting    Medications:  Scheduled: . ALPRAZolam  0.5 mg Oral BID  . FLUoxetine  20 mg Oral Daily  . heparin injection (subcutaneous)  5,000 Units Subcutaneous Q8H  . insulin aspart  0-15 Units Subcutaneous TID WC  . insulin aspart  0-5 Units Subcutaneous QHS  . pantoprazole (PROTONIX) IV  40 mg Intravenous BID  . sodium chloride flush  3 mL Intravenous Q12H  . terazosin  10 mg Oral Daily    Results for orders placed or performed during the hospital encounter of 05/09/19 (from the past 48 hour(s))  Lipase, blood     Status: None   Collection Time: 05/09/19  7:23 PM  Result Value Ref Range   Lipase 27 11 - 51 U/L    Comment: Performed at [facility], [address], [place], [place] [address]  Comprehensive metabolic panel     Status: Abnormal   Collection Time: 05/09/19  7:23 PM  Result Value Ref Range   Sodium 144 135 - 145 mmol/L   Potassium 4.1 3.5 - 5.1 mmol/L   Chloride 103 98 - 111 mmol/L   CO2 26 22 - 32  mmol/L   Glucose, Bld 179 (H) 70 - 99 mg/dL   BUN 28 (H) 8 - 23 mg/dL   Creatinine, Ser 1.03 0.61 - 1.24 mg/dL   Calcium 9.6 8.9 - 10.3 mg/dL   Total Protein 7.9 6.5 - 8.1 g/dL   Albumin 4.9 3.5 - 5.0 g/dL   AST 16 15 - 41 U/L   ALT 20 0 - 44 U/L   Alkaline Phosphatase 72 38 - 126 U/L   Total Bilirubin 1.0 0.3 - 1.2 mg/dL   GFR calc non Af Amer >60 >60 mL/min   GFR calc Af Amer >60 >60 mL/min   Anion gap 15 5 - 15    Comment: Performed at [facility], [address] [place], [place] [address]  CBC     Status: None   Collection Time: 05/09/19  7:23 PM  Result Value Ref Range   WBC 6.7 4.0 - 10.5 K/uL   RBC 5.39 4.22 - 5.81  MIL/uL   Hemoglobin 15.7 13.0 - 17.0 g/dL   HCT 48.7 39.0 - 52.0 %   MCV 90.4 80.0 - 100.0 fL   MCH 29.1 26.0 - 34.0 pg   MCHC 32.2 30.0 - 36.0 g/dL   RDW 14.4 11.5 - 15.5 %   Platelets 170 150 - 400 K/uL   nRBC 0.0 0.0 - 0.2 %    Comment: Performed at [facility], [address], [place], [place] [address]  Troponin I (High Sensitivity)     Status: None   Collection Time: 05/09/19  7:40 PM  Result Value Ref Range   Troponin I (High Sensitivity) 5 <18 ng/L    Comment: (NOTE) Elevated high sensitivity troponin I (hsTnI) values and significant  changes across serial measurements may suggest ACS but many other  chronic and acute conditions are known to elevate hsTnI results.  Refer to the Links section for chest pain algorithms and additional  guidance. Performed at [facility], [address] [place], [place] [address]   Troponin I (High Sensitivity)     Status: None   Collection Time: 05/09/19  9:23 PM  Result Value Ref Range   Troponin I (High Sensitivity) 5 <18 ng/L    Comment: (NOTE) Elevated high sensitivity troponin I (hsTnI) values and significant  changes across serial measurements may suggest ACS but many other  chronic and acute conditions are known to elevate hsTnI results.  Refer to the "Links" section for chest pain algorithms and additional  guidance. Performed at [facility], [address] [place], [place] [address]   Respiratory Panel by RT PCR (Flu A&B, Covid) - Nasopharyngeal Swab     Status: None   Collection Time: 05/09/19 10:37 PM   Specimen: Nasopharyngeal Swab  Result Value Ref Range   SARS Coronavirus 2 by RT PCR NEGATIVE NEGATIVE    Comment: (NOTE) SARS-CoV-2 target nucleic acids are NOT DETECTED. The SARS-CoV-2 RNA is generally detectable in upper respiratoy specimens during the acute phase of infection. The lowest concentration of SARS-CoV-2 viral copies this assay can detect is 131 copies/mL. A negative result does not preclude  SARS-Cov-2 infection and should not be used as the sole basis for treatment or other patient management decisions. A negative result may occur with  improper specimen collection/handling, submission of specimen other than nasopharyngeal swab, presence of viral mutation(s) within the areas targeted by this assay, and inadequate number of viral copies (<131 copies/mL). A negative result must be combined with clinical observations, patient history, and epidemiological information. The  expected result is Negative. Fact Sheet for Patients:  PinkCheek.be Fact Sheet for Healthcare Providers:  GravelBags.it This test is not yet ap proved or cleared by the [place] FDA and  has been authorized for detection and/or diagnosis of SARS-CoV-2 by FDA under an Emergency Use Authorization (EUA). This EUA will remain  in effect (meaning this test can be used) for the duration of the COVID-19 declaration under Section 564(b)(1) of the Act, 21 U.S.C. section 360bbb-3(b)(1), unless the authorization is terminated or revoked sooner.    Influenza A by PCR NEGATIVE NEGATIVE   Influenza B by PCR NEGATIVE NEGATIVE    Comment: (NOTE) The Xpert Xpress SARS-CoV-2/FLU/RSV assay is intended as an aid in  the diagnosis of influenza from Nasopharyngeal swab specimens and  should not be used as a sole basis for treatment. Nasal washings and  aspirates are unacceptable for Xpert Xpress SARS-CoV-2/FLU/RSV  testing. Fact Sheet for Patients: PinkCheek.be Fact Sheet for Healthcare Providers: GravelBags.it This test is not yet approved or cleared by the [place] FDA and  has been authorized for detection and/or diagnosis of SARS-CoV-2 by  FDA under an Emergency Use Authorization (EUA). This EUA will remain  in effect (meaning this test can be used) for the duration of the  Covid-19 declaration  under Section 564(b)(1) of the Act, 21  U.S.C. section 360bbb-3(b)(1), unless the authorization is  terminated or revoked. Performed at [facility], [address], [place], [place] [address]   Glucose, capillary     Status: Abnormal   Collection Time: 05/10/19  4:23 AM  Result Value Ref Range   Glucose-Capillary 136 (H) 70 - 99 mg/dL  Basic metabolic panel     Status: Abnormal   Collection Time: 05/10/19  6:43 AM  Result Value Ref Range   Sodium 145 135 - 145 mmol/L   Potassium 3.9 3.5 - 5.1 mmol/L   Chloride 107 98 - 111 mmol/L   CO2 26 22 - 32 mmol/L   Glucose, Bld 147 (H) 70 - 99 mg/dL   BUN 23 8 - 23 mg/dL   Creatinine, Ser 0.90 0.61 - 1.24 mg/dL   Calcium 8.9 8.9 - 10.3 mg/dL   GFR calc non Af Amer >60 >60 mL/min   GFR calc Af Amer >60 >60 mL/min   Anion gap 12 5 - 15    Comment: Performed at [facility], [address], [place], [place] [address]  CBC     Status: Abnormal   Collection Time: 05/10/19  6:43 AM  Result Value Ref Range   WBC 5.9 4.0 - 10.5 K/uL   RBC 4.84 4.22 - 5.81 MIL/uL   Hemoglobin 14.0 13.0 - 17.0 g/dL   HCT 44.0 39.0 - 52.0 %   MCV 90.9 80.0 - 100.0 fL   MCH 28.9 26.0 - 34.0 pg   MCHC 31.8 30.0 - 36.0 g/dL   RDW 14.5 11.5 - 15.5 %   Platelets 147 (L) 150 - 400 K/uL   nRBC 0.0 0.0 - 0.2 %    Comment: Performed at [facility], [address] [place], [place] [address]  Glucose, capillary     Status: Abnormal   Collection Time: 05/10/19 11:17 AM  Result Value Ref Range   Glucose-Capillary 126 (H) 70 - 99 mg/dL  Glucose, capillary     Status: Abnormal   Collection Time: 05/10/19 12:51 PM  Result Value Ref Range   Glucose-Capillary 121 (H) 70 - 99 mg/dL  Glucose, capillary     Status:  Abnormal   Collection Time: 05/10/19  2:13 PM  Result Value Ref Range   Glucose-Capillary 114 (H) 70 - 99 mg/dL  Glucose, capillary     Status: Abnormal   Collection Time: 05/10/19 11:50 PM  Result Value Ref Range   Glucose-Capillary 131 (H) 70 - 99 mg/dL    Comment 1 Notify RN    Comment 2 Document in Chart   Glucose, capillary     Status: Abnormal   Collection Time: 05/11/19  5:03 AM  Result Value Ref Range   Glucose-Capillary 120 (H) 70 - 99 mg/dL   Comment 1 Notify RN   Hemoglobin A1c     Status: Abnormal   Collection Time: 05/11/19  5:59 AM  Result Value Ref Range   Hgb A1c MFr Bld 7.1 (H) 4.8 - 5.6 %    Comment: (NOTE) Pre diabetes:          5.7%-6.4% Diabetes:              >6.4% Glycemic control for   <7.0% adults with diabetes    Mean Plasma Glucose 157.07 mg/dL    Comment: Performed at [facility] [address] [place], [place] [address]  Comprehensive metabolic panel     Status: Abnormal   Collection Time: 05/11/19  8:20 AM  Result Value Ref Range   Sodium 142 135 - 145 mmol/L   Potassium 3.4 (L) 3.5 - 5.1 mmol/L   Chloride 105 98 - 111 mmol/L   CO2 25 22 - 32 mmol/L   Glucose, Bld 143 (H) 70 - 99 mg/dL   BUN 18 8 - 23 mg/dL   Creatinine, Ser 0.84 0.61 - 1.24 mg/dL   Calcium 8.7 (L) 8.9 - 10.3 mg/dL   Total Protein 6.8 6.5 - 8.1 g/dL   Albumin 4.1 3.5 - 5.0 g/dL   AST 17 15 - 41 U/L   ALT 18 0 - 44 U/L   Alkaline Phosphatase 68 38 - 126 U/L   Total Bilirubin 0.9 0.3 - 1.2 mg/dL   GFR calc non Af Amer >60 >60 mL/min   GFR calc Af Amer >60 >60 mL/min   Anion gap 12 5 - 15    Comment: Performed at [facility], [address] [place], [place] [address]    CT Chest W Contrast  Result Date: 05/09/2019 CLINICAL DATA:  Nausea and vomiting. EXAM: CT CHEST, ABDOMEN, AND PELVIS WITH CONTRAST TECHNIQUE: Multidetector CT imaging of the chest, abdomen and pelvis was performed following the standard protocol during bolus administration of intravenous contrast. CONTRAST:  112mL OMNIPAQUE IOHEXOL 300 MG/ML  SOLN COMPARISON:  Abdomen pelvis CT, dated January 14, 2007, is available for comparison. FINDINGS: CT CHEST FINDINGS Cardiovascular: There is moderate severity calcification of the thoracic aorta. Normal heart size. No  pericardial effusion. Marked severity coronary artery calcification is seen. Mediastinum/Nodes: No enlarged mediastinal, hilar, or axillary lymph nodes. Small cystic structures are seen within both lobes of the thyroid gland. Dilatation of the esophagus is seen which measures approximately 2.7 cm x 1.8 cm. A small hiatal hernia is noted. Lungs/Pleura: Lungs are clear. No pleural effusion or pneumothorax. Musculoskeletal: Multilevel degenerative changes seen throughout the thoracic spine. CT ABDOMEN PELVIS FINDINGS Hepatobiliary: No focal liver abnormality is seen. No gallstones, gallbladder wall thickening, or biliary dilatation. Pancreas: Unremarkable. No pancreatic ductal dilatation or surrounding inflammatory changes. Spleen: Normal in size without focal abnormality. Adrenals/Urinary Tract: The right adrenal gland is normal in appearance. 0.6 cm 1.0 cm fat density  left adrenal masses are seen. Kidneys are normal, without renal calculi or hydronephrosis. A 5 mm cyst is seen within the posterolateral aspect of the mid right kidney. Bladder is unremarkable. Stomach/Bowel: Stomach is within normal limits. Appendix appears normal. No evidence of bowel wall thickening, distention, or inflammatory changes. Noninflamed diverticula is seen throughout the descending colon. Vascular/Lymphatic: Moderate to marked severity aortic atherosclerosis. No enlarged abdominal or pelvic lymph nodes. Reproductive: Prostate is unremarkable. Other: No abdominal wall hernia or abnormality. No abdominopelvic ascites. Musculoskeletal: A predominant stable 9 mm sclerotic focus is seen within the posterior aspect of the sacrum on the left. Degenerative changes seen throughout the lumbar spine. IMPRESSION: 1. No evidence of acute or active cardiopulmonary disease. 2. Small hiatal hernia. 3. Small left adrenal myelolipomas. 4. Noninflamed diverticula of the descending colon. Electronically Signed   By: Virgina Norfolk M.D.   On: 05/09/2019  22:07   CT ABDOMEN PELVIS W CONTRAST  Result Date: 05/09/2019 CLINICAL DATA:  Nausea and vomiting. EXAM: CT CHEST, ABDOMEN, AND PELVIS WITH CONTRAST TECHNIQUE: Multidetector CT imaging of the chest, abdomen and pelvis was performed following the standard protocol during bolus administration of intravenous contrast. CONTRAST:  165mL OMNIPAQUE IOHEXOL 300 MG/ML  SOLN COMPARISON:  Abdomen pelvis CT, dated January 14, 2007, is available for comparison. FINDINGS: CT CHEST FINDINGS Cardiovascular: There is moderate severity calcification of the thoracic aorta. Normal heart size. No pericardial effusion. Marked severity coronary artery calcification is seen. Mediastinum/Nodes: No enlarged mediastinal, hilar, or axillary lymph nodes. Small cystic structures are seen within both lobes of the thyroid gland. Dilatation of the esophagus is seen which measures approximately 2.7 cm x 1.8 cm. A small hiatal hernia is noted. Lungs/Pleura: Lungs are clear. No pleural effusion or pneumothorax. Musculoskeletal: Multilevel degenerative changes seen throughout the thoracic spine. CT ABDOMEN PELVIS FINDINGS Hepatobiliary: No focal liver abnormality is seen. No gallstones, gallbladder wall thickening, or biliary dilatation. Pancreas: Unremarkable. No pancreatic ductal dilatation or surrounding inflammatory changes. Spleen: Normal in size without focal abnormality. Adrenals/Urinary Tract: The right adrenal gland is normal in appearance. 0.6 cm 1.0 cm fat density left adrenal masses are seen. Kidneys are normal, without renal calculi or hydronephrosis. A 5 mm cyst is seen within the posterolateral aspect of the mid right kidney. Bladder is unremarkable. Stomach/Bowel: Stomach is within normal limits. Appendix appears normal. No evidence of bowel wall thickening, distention, or inflammatory changes. Noninflamed diverticula is seen throughout the descending colon. Vascular/Lymphatic: Moderate to marked severity aortic atherosclerosis. No  enlarged abdominal or pelvic lymph nodes. Reproductive: Prostate is unremarkable. Other: No abdominal wall hernia or abnormality. No abdominopelvic ascites. Musculoskeletal: A predominant stable 9 mm sclerotic focus is seen within the posterior aspect of the sacrum on the left. Degenerative changes seen throughout the lumbar spine. IMPRESSION: 1. No evidence of acute or active cardiopulmonary disease. 2. Small hiatal hernia. 3. Small left adrenal myelolipomas. 4. Noninflamed diverticula of the descending colon. Electronically Signed   By: Virgina Norfolk M.D.   On: 05/09/2019 22:07   ECHOCARDIOGRAM COMPLETE  Result Date: 05/10/2019   ECHOCARDIOGRAM REPORT   Patient Name:   Kurt Duran Date of Exam: 05/10/2019 Medical Rec #:  [id number]       Height:       73.0 in Accession #:    [id number]      Weight:       183.6 lb Date of Birth:  1945-05-10       BSA:  2.08 m Patient Age:    75 years        BP:           161/84 mmHg Patient Gender: M               HR:           81 bpm. Exam Location:  [facility] Procedure: 2D Echo, Cardiac Doppler and Color Doppler Indications:    Dyspnea 786.09 / R06.00  History:        Patient has no prior history of Echocardiogram examinations.                 COPD; Risk Factors:Hypertension and Diabetes. GERD.  Sonographer:    Alvino Chapel RCS Referring Phys: [phone] COURAGE EMOKPAE IMPRESSIONS  1. Left ventricular ejection fraction, by visual estimation, is 60 to 65%. The left ventricle has normal function. There is mildly increased left ventricular hypertrophy.  2. Left ventricular diastolic parameters are consistent with Grade I diastolic dysfunction (impaired relaxation).  3. The left ventricle has no regional wall motion abnormalities.  4. Global right ventricle has normal systolic function.The right ventricular size is normal. No increase in right ventricular wall thickness.  5. Left atrial size was normal.  6. Right atrial size was normal.  7. Mild mitral annular  calcification.  8. The mitral valve is grossly normal. Trivial mitral valve regurgitation.  9. The tricuspid valve is grossly normal. 10. The tricuspid valve is grossly normal. Tricuspid valve regurgitation is trivial. 11. The aortic valve is tricuspid. Aortic valve regurgitation is not visualized. Mild aortic valve stenosis. 12. The pulmonic valve was not well visualized. Pulmonic valve regurgitation is not visualized. 13. The inferior vena cava is normal in size with greater than 50% respiratory variability, suggesting right atrial pressure of 3 mmHg. FINDINGS  Left Ventricle: Left ventricular ejection fraction, by visual estimation, is 60 to 65%. The left ventricle has normal function. The left ventricle has no regional wall motion abnormalities. The left ventricular internal cavity size was the left ventricle is normal in size. There is mildly increased left ventricular hypertrophy. Concentric left ventricular hypertrophy. Left ventricular diastolic parameters are consistent with Grade I diastolic dysfunction (impaired relaxation). Indeterminate filling pressures. Right Ventricle: The right ventricular size is normal. No increase in right ventricular wall thickness. Global RV systolic function is has normal systolic function. Left Atrium: Left atrial size was normal in size. Right Atrium: Right atrial size was normal in size Pericardium: There is no evidence of pericardial effusion. Mitral Valve: The mitral valve is grossly normal. Mild mitral annular calcification. Trivial mitral valve regurgitation. Tricuspid Valve: The tricuspid valve is grossly normal. Tricuspid valve regurgitation is trivial. Aortic Valve: The aortic valve is tricuspid. . There is mild thickening of the aortic valve. Aortic valve regurgitation is not visualized. Mild aortic stenosis is present. Mild aortic valve annular calcification. There is mild thickening of the aortic valve. Aortic valve mean gradient measures 8.0 mmHg. Aortic valve  peak gradient measures 16.6 mmHg. Aortic valve area, by VTI measures 1.79 cm. Pulmonic Valve: The pulmonic valve was not well visualized. Pulmonic valve regurgitation is not visualized. Pulmonic regurgitation is not visualized. Aorta: The aortic root is normal in size and structure. Venous: The inferior vena cava is normal in size with greater than 50% respiratory variability, suggesting right atrial pressure of 3 mmHg. IAS/Shunts: No atrial level shunt detected by color flow Doppler.  LEFT VENTRICLE PLAX 2D LVIDd:  4.67 cm  Diastology LVIDs:         2.51 cm  LV e' lateral:   7.83 cm/s LV PW:         1.02 cm  LV E/e' lateral: 12.9 LV IVS:        1.11 cm  LV e' medial:    7.29 cm/s LVOT diam:     2.00 cm  LV E/e' medial:  13.9 LV SV:         78 ml LV SV Index:   37.74 LVOT Area:     3.14 cm  RIGHT VENTRICLE RV S prime:     17.80 cm/s TAPSE (M-mode): 2.3 cm LEFT ATRIUM           Index       RIGHT ATRIUM           Index LA diam:      3.30 cm 1.59 cm/m  RA Area:     20.50 cm LA Vol (A2C): 49.3 ml 23.76 ml/m RA Volume:   56.70 ml  27.33 ml/m LA Vol (A4C): 47.6 ml 22.94 ml/m  AORTIC VALVE AV Area (Vmax):    1.68 cm AV Area (Vmean):   1.85 cm AV Area (VTI):     1.79 cm AV Vmax:           204.00 cm/s AV Vmean:          131.000 cm/s AV VTI:            0.408 m AV Peak Grad:      16.6 mmHg AV Mean Grad:      8.0 mmHg LVOT Vmax:         109.00 cm/s LVOT Vmean:        77.100 cm/s LVOT VTI:          0.232 m LVOT/AV VTI ratio: 0.57  AORTA Ao Root diam: 3.40 cm MITRAL VALVE MV Area (PHT): 2.91 cm              SHUNTS MV PHT:        75.69 msec            Systemic VTI:  0.23 m MV Decel Time: 261 msec              Systemic Diam: 2.00 cm MV E velocity: 101.00 cm/s 103 cm/s MV A velocity: 110.00 cm/s 70.3 cm/s MV E/A ratio:  0.92        1.5  Kate Sable MD Electronically signed by Kate Sable MD Signature Date/Time: 05/10/2019/3:35:48 PM    Final     ROS:  Pertinent items are noted in HPI.  Blood  pressure (!) 146/76, pulse 72, temperature 97.9 F (36.6 C), temperature source Oral, resp. rate 17, height 6\' 1"  (1.854 m), weight 83.9 kg, SpO2 98 %. Physical Exam: WD/WN WM in NAD HEENT normocephalic, atraumatic Lungs:  CTA with equal bs bilaterally Cor:  RRR without s3, s4, murmurs Abdomen soft, nontender, nondistended.  Records reviewed.  Assessment/Plan: Impression: Esophageal neoplasm with stricture and dysphagia resulting in weight loss Plan:  Patient is scheduled for jejunostomy tube placement, portacath placement on 05/13/19.  The risks and benefits of both procedures including bleeding, infection, bowel injury, and pneumothorax were fully explained to the patient, who gives informed consent.  Aviva Signs 05/11/2019, 10:19 AM

## 2019-05-11 NOTE — Progress Notes (Signed)
Patient Demographics:    Kurt Duran, is a 74 y.o. male, DOB - 03-Jun-1945, [id number]  Admit date - 05/09/2019   Admitting Physician Gauri Galvao Denton Brick, MD  Outpatient Primary MD for the patient is Glenda Chroman, MD  LOS - 1   Chief Complaint  Patient presents with  . Emesis        Subjective:    Kurt Duran today has no fevers,    No chest pain,   -Complains of fatigue -  Assessment  & Plan :    Principal Problem:   Esophageal mass-Lower 3rd Active Problems:   Intractable vomiting with nausea   COPD (chronic obstructive pulmonary disease) (HCC)   DM2 (diabetes mellitus, type 2) (HCC)   GERD (gastroesophageal reflux disease)   HTN (hypertension)   Gout  Brief Summary:- 74 y.o. 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 Delton Coombes -Discussed with gastroenterologist and endoscopist Dr. Oneida Alar -Discussed with general surgeon Dr. Arnoldo Morale -Plan is for open J-tube procedure to allow for feeding on 05/13/19 -Chemo/radiation and then possibly down the road resection/surgery of the esophageal mass -PortaCath planned for 05/13/19  2)COPD--no acute exacerbation at this time, continue bronchodilators  3)DM2-   No recent A1c available, -Given poor oral intake, hold Metformin and glipizide Use Novolog/Humalog Sliding scale insulin with Accu-Cheks/Fingersticks as ordered   4)DEpression/Anxiety--stable at this time, okay to use IV lorazepam as needed severe anxiety given diagnosis of possible malignancy  5)FEN--patient unable to tolerate oral intake, give dextrose  solution onto J-tube to replace   Disposition/Need for in-Hospital Stay- patient unable to be discharged at this time due to --intractable emesis, need for IV fluids/IV nutrition pending further GI/oncology/surgery work-up* --Anticipate discharge home after work-up for esophageal malignancy is completed and nutritional issues resolved--patient currently not able to take oral intake  Code Status :   Family Communication:   (patient is alert, awake and coherent) Discussed with wife   Procedures:- jejunostomy tube placement, portacath placement on 05/13/19  Consults  :  Gi/Oncology/gen surgery  DVT Prophylaxis  :  TEDs/SCDs   Lab Results  Component Value Date   PLT 147 (L) 05/10/2019    Inpatient Medications  Scheduled Meds: . ALPRAZolam  0.5 mg Oral BID  . FLUoxetine  20 mg Oral Daily  . heparin injection (subcutaneous)  5,000 Units Subcutaneous Q8H  . insulin aspart  0-15 Units Subcutaneous TID WC  . insulin aspart  0-5 Units Subcutaneous QHS  . pantoprazole (PROTONIX) IV  40 mg Intravenous BID  . sodium chloride flush  3 mL Intravenous Q12H  . terazosin  10 mg Oral Daily   Continuous Infusions: . sodium chloride    . dextrose 5 % and 0.45% NaCl Stopped (05/11/19 1427)   PRN Meds:.sodium chloride, acetaminophen **OR** acetaminophen, albuterol, alum & mag hydroxide-simeth, bisacodyl, labetalol, LORazepam, metoCLOPramide (REGLAN) injection, ondansetron **OR** ondansetron (ZOFRAN) IV, polyethylene glycol, sodium chloride flush    Anti-infectives (From admission, onward)   None  Objective:   Vitals:   05/11/19 0427 05/11/19 0505 05/11/19 0841 05/11/19 1302  BP:  (!) 146/76  (!) 154/72  Pulse:  72  75  Resp:  17  18  Temp:  97.9 F (36.6 C)  97.8 F (36.6 C)  TempSrc:  Oral    SpO2:  99% 98% 99%  Weight: 83.9 kg     Height:        Wt Readings from Last 3 Encounters:  05/11/19 83.9 kg  04/03/16 102.1 kg     Intake/Output Summary (Last 24 hours) at  05/11/2019 1720 Last data filed at 05/11/2019 1500 Gross per 24 hour  Intake 1620.16 ml  Output 100 ml  Net 1520.16 ml     Physical Exam  Gen:- Awake Alert,  In no apparent distress  HEENT:- .AT, No sclera icterus Neck-Supple Neck,No JVD,.  Lungs-  CTAB , fair symmetrical air movement CV- S1, S2 normal, regular  Abd-  +ve B.Sounds, Abd Soft, No tenderness,    Extremity/Skin:- No  edema, pedal pulses present  Psych-affect is appropriate, oriented x3 Neuro-no new focal deficits, no tremors   Data Review:   Micro Results Recent Results (from the past 240 hour(s))  Respiratory Panel by RT PCR (Flu A&B, Covid) - Nasopharyngeal Swab     Status: None   Collection Time: 05/09/19 10:37 PM   Specimen: Nasopharyngeal Swab  Result Value Ref Range Status   SARS Coronavirus 2 by RT PCR NEGATIVE NEGATIVE Final    Comment: (NOTE) SARS-CoV-2 target nucleic acids are NOT DETECTED. The SARS-CoV-2 RNA is generally detectable in upper respiratoy specimens during the acute phase of infection. The lowest concentration of SARS-CoV-2 viral copies this assay can detect is 131 copies/mL. A negative result does not preclude SARS-Cov-2 infection and should not be used as the sole basis for treatment or other patient management decisions. A negative result may occur with  improper specimen collection/handling, submission of specimen other than nasopharyngeal swab, presence of viral mutation(s) within the areas targeted by this assay, and inadequate number of viral copies (<131 copies/mL). A negative result must be combined with clinical observations, patient history, and epidemiological information. The expected result is Negative. Fact Sheet for Patients:  PinkCheek.be Fact Sheet for Healthcare Providers:  GravelBags.it This test is not yet ap proved or cleared by the [place] FDA and  has been authorized for detection and/or diagnosis  of SARS-CoV-2 by FDA under an Emergency Use Authorization (EUA). This EUA will remain  in effect (meaning this test can be used) for the duration of the COVID-19 declaration under Section 564(b)(1) of the Act, 21 U.S.C. section 360bbb-3(b)(1), unless the authorization is terminated or revoked sooner.    Influenza A by PCR NEGATIVE NEGATIVE Final   Influenza B by PCR NEGATIVE NEGATIVE Final    Comment: (NOTE) The Xpert Xpress SARS-CoV-2/FLU/RSV assay is intended as an aid in  the diagnosis of influenza from Nasopharyngeal swab specimens and  should not be used as a sole basis for treatment. Nasal washings and  aspirates are unacceptable for Xpert Xpress SARS-CoV-2/FLU/RSV  testing. Fact Sheet for Patients: PinkCheek.be Fact Sheet for Healthcare Providers: GravelBags.it This test is not yet approved or cleared by the [place] FDA and  has been authorized for detection and/or diagnosis of SARS-CoV-2 by  FDA under an Emergency Use Authorization (EUA). This EUA will remain  in effect (meaning this test can be used) for the duration of the  Covid-19 declaration under Section 564(b)(1) of the Act,  21  U.S.C. section 360bbb-3(b)(1), unless the authorization is  terminated or revoked. Performed at [facility] [address], [place], [place] [address]     Radiology Reports CT Chest W Contrast  Result Date: 05/09/2019 CLINICAL DATA:  Nausea and vomiting. EXAM: CT CHEST, ABDOMEN, AND PELVIS WITH CONTRAST TECHNIQUE: Multidetector CT imaging of the chest, abdomen and pelvis was performed following the standard protocol during bolus administration of intravenous contrast. CONTRAST:  125mL OMNIPAQUE IOHEXOL 300 MG/ML  SOLN COMPARISON:  Abdomen pelvis CT, dated January 14, 2007, is available for comparison. FINDINGS: CT CHEST FINDINGS Cardiovascular: There is moderate severity calcification of the thoracic aorta. Normal heart size. No  pericardial effusion. Marked severity coronary artery calcification is seen. Mediastinum/Nodes: No enlarged mediastinal, hilar, or axillary lymph nodes. Small cystic structures are seen within both lobes of the thyroid gland. Dilatation of the esophagus is seen which measures approximately 2.7 cm x 1.8 cm. A small hiatal hernia is noted. Lungs/Pleura: Lungs are clear. No pleural effusion or pneumothorax. Musculoskeletal: Multilevel degenerative changes seen throughout the thoracic spine. CT ABDOMEN PELVIS FINDINGS Hepatobiliary: No focal liver abnormality is seen. No gallstones, gallbladder wall thickening, or biliary dilatation. Pancreas: Unremarkable. No pancreatic ductal dilatation or surrounding inflammatory changes. Spleen: Normal in size without focal abnormality. Adrenals/Urinary Tract: The right adrenal gland is normal in appearance. 0.6 cm 1.0 cm fat density left adrenal masses are seen. Kidneys are normal, without renal calculi or hydronephrosis. A 5 mm cyst is seen within the posterolateral aspect of the mid right kidney. Bladder is unremarkable. Stomach/Bowel: Stomach is within normal limits. Appendix appears normal. No evidence of bowel wall thickening, distention, or inflammatory changes. Noninflamed diverticula is seen throughout the descending colon. Vascular/Lymphatic: Moderate to marked severity aortic atherosclerosis. No enlarged abdominal or pelvic lymph nodes. Reproductive: Prostate is unremarkable. Other: No abdominal wall hernia or abnormality. No abdominopelvic ascites. Musculoskeletal: A predominant stable 9 mm sclerotic focus is seen within the posterior aspect of the sacrum on the left. Degenerative changes seen throughout the lumbar spine. IMPRESSION: 1. No evidence of acute or active cardiopulmonary disease. 2. Small hiatal hernia. 3. Small left adrenal myelolipomas. 4. Noninflamed diverticula of the descending colon. Electronically Signed   By: Virgina Norfolk M.D.   On: 05/09/2019  22:07   CT ABDOMEN PELVIS W CONTRAST  Result Date: 05/09/2019 CLINICAL DATA:  Nausea and vomiting. EXAM: CT CHEST, ABDOMEN, AND PELVIS WITH CONTRAST TECHNIQUE: Multidetector CT imaging of the chest, abdomen and pelvis was performed following the standard protocol during bolus administration of intravenous contrast. CONTRAST:  168mL OMNIPAQUE IOHEXOL 300 MG/ML  SOLN COMPARISON:  Abdomen pelvis CT, dated January 14, 2007, is available for comparison. FINDINGS: CT CHEST FINDINGS Cardiovascular: There is moderate severity calcification of the thoracic aorta. Normal heart size. No pericardial effusion. Marked severity coronary artery calcification is seen. Mediastinum/Nodes: No enlarged mediastinal, hilar, or axillary lymph nodes. Small cystic structures are seen within both lobes of the thyroid gland. Dilatation of the esophagus is seen which measures approximately 2.7 cm x 1.8 cm. A small hiatal hernia is noted. Lungs/Pleura: Lungs are clear. No pleural effusion or pneumothorax. Musculoskeletal: Multilevel degenerative changes seen throughout the thoracic spine. CT ABDOMEN PELVIS FINDINGS Hepatobiliary: No focal liver abnormality is seen. No gallstones, gallbladder wall thickening, or biliary dilatation. Pancreas: Unremarkable. No pancreatic ductal dilatation or surrounding inflammatory changes. Spleen: Normal in size without focal abnormality. Adrenals/Urinary Tract: The right adrenal gland is normal in appearance. 0.6 cm 1.0 cm fat density left adrenal masses are  seen. Kidneys are normal, without renal calculi or hydronephrosis. A 5 mm cyst is seen within the posterolateral aspect of the mid right kidney. Bladder is unremarkable. Stomach/Bowel: Stomach is within normal limits. Appendix appears normal. No evidence of bowel wall thickening, distention, or inflammatory changes. Noninflamed diverticula is seen throughout the descending colon. Vascular/Lymphatic: Moderate to marked severity aortic atherosclerosis. No  enlarged abdominal or pelvic lymph nodes. Reproductive: Prostate is unremarkable. Other: No abdominal wall hernia or abnormality. No abdominopelvic ascites. Musculoskeletal: A predominant stable 9 mm sclerotic focus is seen within the posterior aspect of the sacrum on the left. Degenerative changes seen throughout the lumbar spine. IMPRESSION: 1. No evidence of acute or active cardiopulmonary disease. 2. Small hiatal hernia. 3. Small left adrenal myelolipomas. 4. Noninflamed diverticula of the descending colon. Electronically Signed   By: Virgina Norfolk M.D.   On: 05/09/2019 22:07   ECHOCARDIOGRAM COMPLETE  Result Date: 05/10/2019   ECHOCARDIOGRAM REPORT   Patient Name:   Kurt Duran Date of Exam: 05/10/2019 Medical Rec #:  [id number]       Height:       73.0 in Accession #:    [id number]      Weight:       183.6 lb Date of Birth:  09/16/1945       BSA:          2.08 m Patient Age:    50 years        BP:           161/84 mmHg Patient Gender: M               HR:           81 bpm. Exam Location:  [facility] Procedure: 2D Echo, Cardiac Doppler and Color Doppler Indications:    Dyspnea 786.09 / R06.00  History:        Patient has no prior history of Echocardiogram examinations.                 COPD; Risk Factors:Hypertension and Diabetes. GERD.  Sonographer:    Alvino Chapel RCS Referring Phys: [phone] Arnett Galindez IMPRESSIONS  1. Left ventricular ejection fraction, by visual estimation, is 60 to 65%. The left ventricle has normal function. There is mildly increased left ventricular hypertrophy.  2. Left ventricular diastolic parameters are consistent with Grade I diastolic dysfunction (impaired relaxation).  3. The left ventricle has no regional wall motion abnormalities.  4. Global right ventricle has normal systolic function.The right ventricular size is normal. No increase in right ventricular wall thickness.  5. Left atrial size was normal.  6. Right atrial size was normal.  7. Mild mitral annular  calcification.  8. The mitral valve is grossly normal. Trivial mitral valve regurgitation.  9. The tricuspid valve is grossly normal. 10. The tricuspid valve is grossly normal. Tricuspid valve regurgitation is trivial. 11. The aortic valve is tricuspid. Aortic valve regurgitation is not visualized. Mild aortic valve stenosis. 12. The pulmonic valve was not well visualized. Pulmonic valve regurgitation is not visualized. 13. The inferior vena cava is normal in size with greater than 50% respiratory variability, suggesting right atrial pressure of 3 mmHg. FINDINGS  Left Ventricle: Left ventricular ejection fraction, by visual estimation, is 60 to 65%. The left ventricle has normal function. The left ventricle has no regional wall motion abnormalities. The left ventricular internal cavity size was the left ventricle is normal in size. There is mildly increased left ventricular hypertrophy. Concentric left  ventricular hypertrophy. Left ventricular diastolic parameters are consistent with Grade I diastolic dysfunction (impaired relaxation). Indeterminate filling pressures. Right Ventricle: The right ventricular size is normal. No increase in right ventricular wall thickness. Global RV systolic function is has normal systolic function. Left Atrium: Left atrial size was normal in size. Right Atrium: Right atrial size was normal in size Pericardium: There is no evidence of pericardial effusion. Mitral Valve: The mitral valve is grossly normal. Mild mitral annular calcification. Trivial mitral valve regurgitation. Tricuspid Valve: The tricuspid valve is grossly normal. Tricuspid valve regurgitation is trivial. Aortic Valve: The aortic valve is tricuspid. . There is mild thickening of the aortic valve. Aortic valve regurgitation is not visualized. Mild aortic stenosis is present. Mild aortic valve annular calcification. There is mild thickening of the aortic valve. Aortic valve mean gradient measures 8.0 mmHg. Aortic valve  peak gradient measures 16.6 mmHg. Aortic valve area, by VTI measures 1.79 cm. Pulmonic Valve: The pulmonic valve was not well visualized. Pulmonic valve regurgitation is not visualized. Pulmonic regurgitation is not visualized. Aorta: The aortic root is normal in size and structure. Venous: The inferior vena cava is normal in size with greater than 50% respiratory variability, suggesting right atrial pressure of 3 mmHg. IAS/Shunts: No atrial level shunt detected by color flow Doppler.  LEFT VENTRICLE PLAX 2D LVIDd:         4.67 cm  Diastology LVIDs:         2.51 cm  LV e' lateral:   7.83 cm/s LV PW:         1.02 cm  LV E/e' lateral: 12.9 LV IVS:        1.11 cm  LV e' medial:    7.29 cm/s LVOT diam:     2.00 cm  LV E/e' medial:  13.9 LV SV:         78 ml LV SV Index:   37.74 LVOT Area:     3.14 cm  RIGHT VENTRICLE RV S prime:     17.80 cm/s TAPSE (M-mode): 2.3 cm LEFT ATRIUM           Index       RIGHT ATRIUM           Index LA diam:      3.30 cm 1.59 cm/m  RA Area:     20.50 cm LA Vol (A2C): 49.3 ml 23.76 ml/m RA Volume:   56.70 ml  27.33 ml/m LA Vol (A4C): 47.6 ml 22.94 ml/m  AORTIC VALVE AV Area (Vmax):    1.68 cm AV Area (Vmean):   1.85 cm AV Area (VTI):     1.79 cm AV Vmax:           204.00 cm/s AV Vmean:          131.000 cm/s AV VTI:            0.408 m AV Peak Grad:      16.6 mmHg AV Mean Grad:      8.0 mmHg LVOT Vmax:         109.00 cm/s LVOT Vmean:        77.100 cm/s LVOT VTI:          0.232 m LVOT/AV VTI ratio: 0.57  AORTA Ao Root diam: 3.40 cm MITRAL VALVE MV Area (PHT): 2.91 cm              SHUNTS MV PHT:        75.69 msec  Systemic VTI:  0.23 m MV Decel Time: 261 msec              Systemic Diam: 2.00 cm MV E velocity: 101.00 cm/s 103 cm/s MV A velocity: 110.00 cm/s 70.3 cm/s MV E/A ratio:  0.92        1.5  Kate Sable MD Electronically signed by Kate Sable MD Signature Date/Time: 05/10/2019/3:35:48 PM    Final      CBC Recent Labs  Lab 05/09/19 1923 05/10/19 0643    WBC 6.7 5.9  HGB 15.7 14.0  HCT 48.7 44.0  PLT 170 147*  MCV 90.4 90.9  MCH 29.1 28.9  MCHC 32.2 31.8  RDW 14.4 14.5    Chemistries  Recent Labs  Lab 05/09/19 1923 05/10/19 0643 05/11/19 0820  NA 144 145 142  K 4.1 3.9 3.4*  CL 103 107 105  CO2 26 26 25   GLUCOSE 179* 147* 143*  BUN 28* 23 18  CREATININE 1.03 0.90 0.84  CALCIUM 9.6 8.9 8.7*  AST 16  --  17  ALT 20  --  18  ALKPHOS 72  --  68  BILITOT 1.0  --  0.9   ------------------------------------------------------------------------------------------------------------------ No results for input(s): CHOL, HDL, LDLCALC, TRIG, CHOLHDL, LDLDIRECT in the last 72 hours.  Lab Results  Component Value Date   HGBA1C 7.1 (H) 05/11/2019   ------------------------------------------------------------------------------------------------------------------ No results for input(s): TSH, T4TOTAL, T3FREE, THYROIDAB in the last 72 hours.  Invalid input(s): FREET3 ------------------------------------------------------------------------------------------------------------------ No results for input(s): VITAMINB12, FOLATE, FERRITIN, TIBC, IRON, RETICCTPCT in the last 72 hours.  Coagulation profile No results for input(s): INR, PROTIME in the last 168 hours.  No results for input(s): DDIMER in the last 72 hours.  Cardiac Enzymes No results for input(s): CKMB, TROPONINI, MYOGLOBIN in the last 168 hours.  Invalid input(s): CK ------------------------------------------------------------------------------------------------------------------ No results found for: BNP   Roxan Hockey M.D on 05/11/2019 at 5:20 PM  Go to [email or web address] - for contact info  [facility] - Office  [phone]

## 2019-05-11 NOTE — Progress Notes (Signed)
Subjective: No abdominal pain. NPO. Aware of diagnosis. Dr. Arnoldo Morale in to see patient during assessment this morning.   Objective: Vital signs in last 24 hours: Temp:  [97.7 F (36.5 C)-98.7 F (37.1 C)] 97.9 F (36.6 C) (02/03 0505) Pulse Rate:  [72-85] 72 (02/03 0505) Resp:  [14-20] 17 (02/03 0505) BP: (145-164)/(76-94) 146/76 (02/03 0505) SpO2:  [97 %-99 %] 99 % (02/03 0505) Weight:  [83.9 kg] 83.9 kg (02/03 0427)   General:   Alert and oriented, pleasant Head:  Normocephalic and atraumatic. Eyes:  No icterus, sclera clear. Conjuctiva pink.  Abdomen:  Bowel sounds present, soft, non-tender, non-distended. Neurologic:  Alert and  oriented x4 Psych:   Normal mood and affect.  Intake/Output from previous day: 02/02 0701 - 02/03 0700 In: 2462.1 [I.V.:2462.1] Out: 0  Intake/Output this shift: No intake/output data recorded.  Lab Results: Recent Labs    05/09/19 1923 05/10/19 0643  WBC 6.7 5.9  HGB 15.7 14.0  HCT 48.7 44.0  PLT 170 147*   BMET Recent Labs    05/09/19 1923 05/10/19 0643  NA 144 145  K 4.1 3.9  CL 103 107  CO2 26 26  GLUCOSE 179* 147*  BUN 28* 23  CREATININE 1.03 0.90  CALCIUM 9.6 8.9   LFT Recent Labs    05/09/19 1923  PROT 7.9  ALBUMIN 4.9  AST 16  ALT 20  ALKPHOS 72  BILITOT 1.0    Studies/Results: CT Chest W Contrast  Result Date: 05/09/2019 CLINICAL DATA:  Nausea and vomiting. EXAM: CT CHEST, ABDOMEN, AND PELVIS WITH CONTRAST TECHNIQUE: Multidetector CT imaging of the chest, abdomen and pelvis was performed following the standard protocol during bolus administration of intravenous contrast. CONTRAST:  112mL OMNIPAQUE IOHEXOL 300 MG/ML  SOLN COMPARISON:  Abdomen pelvis CT, dated January 14, 2007, is available for comparison. FINDINGS: CT CHEST FINDINGS Cardiovascular: There is moderate severity calcification of the thoracic aorta. Normal heart size. No pericardial effusion. Marked severity coronary artery calcification is seen.  Mediastinum/Nodes: No enlarged mediastinal, hilar, or axillary lymph nodes. Small cystic structures are seen within both lobes of the thyroid gland. Dilatation of the esophagus is seen which measures approximately 2.7 cm x 1.8 cm. A small hiatal hernia is noted. Lungs/Pleura: Lungs are clear. No pleural effusion or pneumothorax. Musculoskeletal: Multilevel degenerative changes seen throughout the thoracic spine. CT ABDOMEN PELVIS FINDINGS Hepatobiliary: No focal liver abnormality is seen. No gallstones, gallbladder wall thickening, or biliary dilatation. Pancreas: Unremarkable. No pancreatic ductal dilatation or surrounding inflammatory changes. Spleen: Normal in size without focal abnormality. Adrenals/Urinary Tract: The right adrenal gland is normal in appearance. 0.6 cm 1.0 cm fat density left adrenal masses are seen. Kidneys are normal, without renal calculi or hydronephrosis. A 5 mm cyst is seen within the posterolateral aspect of the mid right kidney. Bladder is unremarkable. Stomach/Bowel: Stomach is within normal limits. Appendix appears normal. No evidence of bowel wall thickening, distention, or inflammatory changes. Noninflamed diverticula is seen throughout the descending colon. Vascular/Lymphatic: Moderate to marked severity aortic atherosclerosis. No enlarged abdominal or pelvic lymph nodes. Reproductive: Prostate is unremarkable. Other: No abdominal wall hernia or abnormality. No abdominopelvic ascites. Musculoskeletal: A predominant stable 9 mm sclerotic focus is seen within the posterior aspect of the sacrum on the left. Degenerative changes seen throughout the lumbar spine. IMPRESSION: 1. No evidence of acute or active cardiopulmonary disease. 2. Small hiatal hernia. 3. Small left adrenal myelolipomas. 4. Noninflamed diverticula of the descending colon. Electronically Signed   By:  Virgina Norfolk M.D.   On: 05/09/2019 22:07   CT ABDOMEN PELVIS W CONTRAST  Result Date: 05/09/2019 CLINICAL DATA:   Nausea and vomiting. EXAM: CT CHEST, ABDOMEN, AND PELVIS WITH CONTRAST TECHNIQUE: Multidetector CT imaging of the chest, abdomen and pelvis was performed following the standard protocol during bolus administration of intravenous contrast. CONTRAST:  166mL OMNIPAQUE IOHEXOL 300 MG/ML  SOLN COMPARISON:  Abdomen pelvis CT, dated January 14, 2007, is available for comparison. FINDINGS: CT CHEST FINDINGS Cardiovascular: There is moderate severity calcification of the thoracic aorta. Normal heart size. No pericardial effusion. Marked severity coronary artery calcification is seen. Mediastinum/Nodes: No enlarged mediastinal, hilar, or axillary lymph nodes. Small cystic structures are seen within both lobes of the thyroid gland. Dilatation of the esophagus is seen which measures approximately 2.7 cm x 1.8 cm. A small hiatal hernia is noted. Lungs/Pleura: Lungs are clear. No pleural effusion or pneumothorax. Musculoskeletal: Multilevel degenerative changes seen throughout the thoracic spine. CT ABDOMEN PELVIS FINDINGS Hepatobiliary: No focal liver abnormality is seen. No gallstones, gallbladder wall thickening, or biliary dilatation. Pancreas: Unremarkable. No pancreatic ductal dilatation or surrounding inflammatory changes. Spleen: Normal in size without focal abnormality. Adrenals/Urinary Tract: The right adrenal gland is normal in appearance. 0.6 cm 1.0 cm fat density left adrenal masses are seen. Kidneys are normal, without renal calculi or hydronephrosis. A 5 mm cyst is seen within the posterolateral aspect of the mid right kidney. Bladder is unremarkable. Stomach/Bowel: Stomach is within normal limits. Appendix appears normal. No evidence of bowel wall thickening, distention, or inflammatory changes. Noninflamed diverticula is seen throughout the descending colon. Vascular/Lymphatic: Moderate to marked severity aortic atherosclerosis. No enlarged abdominal or pelvic lymph nodes. Reproductive: Prostate is unremarkable.  Other: No abdominal wall hernia or abnormality. No abdominopelvic ascites. Musculoskeletal: A predominant stable 9 mm sclerotic focus is seen within the posterior aspect of the sacrum on the left. Degenerative changes seen throughout the lumbar spine. IMPRESSION: 1. No evidence of acute or active cardiopulmonary disease. 2. Small hiatal hernia. 3. Small left adrenal myelolipomas. 4. Noninflamed diverticula of the descending colon. Electronically Signed   By: Virgina Norfolk M.D.   On: 05/09/2019 22:07   ECHOCARDIOGRAM COMPLETE  Result Date: 05/10/2019   ECHOCARDIOGRAM REPORT   Patient Name:   Kurt Duran Date of Exam: 05/10/2019 Medical Rec #:  [id number]       Height:       73.0 in Accession #:    [id number]      Weight:       183.6 lb Date of Birth:  05/15/1945       BSA:          2.08 m Patient Age:    33 years        BP:           161/84 mmHg Patient Gender: M               HR:           81 bpm. Exam Location:  [facility] Procedure: 2D Echo, Cardiac Doppler and Color Doppler Indications:    Dyspnea 786.09 / R06.00  History:        Patient has no prior history of Echocardiogram examinations.                 COPD; Risk Factors:Hypertension and Diabetes. GERD.  Sonographer:    Alvino Chapel RCS Referring Phys: [phone] COURAGE EMOKPAE IMPRESSIONS  1. Left ventricular ejection fraction, by visual estimation, is  60 to 65%. The left ventricle has normal function. There is mildly increased left ventricular hypertrophy.  2. Left ventricular diastolic parameters are consistent with Grade I diastolic dysfunction (impaired relaxation).  3. The left ventricle has no regional wall motion abnormalities.  4. Global right ventricle has normal systolic function.The right ventricular size is normal. No increase in right ventricular wall thickness.  5. Left atrial size was normal.  6. Right atrial size was normal.  7. Mild mitral annular calcification.  8. The mitral valve is grossly normal. Trivial mitral valve regurgitation.   9. The tricuspid valve is grossly normal. 10. The tricuspid valve is grossly normal. Tricuspid valve regurgitation is trivial. 11. The aortic valve is tricuspid. Aortic valve regurgitation is not visualized. Mild aortic valve stenosis. 12. The pulmonic valve was not well visualized. Pulmonic valve regurgitation is not visualized. 13. The inferior vena cava is normal in size with greater than 50% respiratory variability, suggesting right atrial pressure of 3 mmHg. FINDINGS  Left Ventricle: Left ventricular ejection fraction, by visual estimation, is 60 to 65%. The left ventricle has normal function. The left ventricle has no regional wall motion abnormalities. The left ventricular internal cavity size was the left ventricle is normal in size. There is mildly increased left ventricular hypertrophy. Concentric left ventricular hypertrophy. Left ventricular diastolic parameters are consistent with Grade I diastolic dysfunction (impaired relaxation). Indeterminate filling pressures. Right Ventricle: The right ventricular size is normal. No increase in right ventricular wall thickness. Global RV systolic function is has normal systolic function. Left Atrium: Left atrial size was normal in size. Right Atrium: Right atrial size was normal in size Pericardium: There is no evidence of pericardial effusion. Mitral Valve: The mitral valve is grossly normal. Mild mitral annular calcification. Trivial mitral valve regurgitation. Tricuspid Valve: The tricuspid valve is grossly normal. Tricuspid valve regurgitation is trivial. Aortic Valve: The aortic valve is tricuspid. . There is mild thickening of the aortic valve. Aortic valve regurgitation is not visualized. Mild aortic stenosis is present. Mild aortic valve annular calcification. There is mild thickening of the aortic valve. Aortic valve mean gradient measures 8.0 mmHg. Aortic valve peak gradient measures 16.6 mmHg. Aortic valve area, by VTI measures 1.79 cm. Pulmonic  Valve: The pulmonic valve was not well visualized. Pulmonic valve regurgitation is not visualized. Pulmonic regurgitation is not visualized. Aorta: The aortic root is normal in size and structure. Venous: The inferior vena cava is normal in size with greater than 50% respiratory variability, suggesting right atrial pressure of 3 mmHg. IAS/Shunts: No atrial level shunt detected by color flow Doppler.  LEFT VENTRICLE PLAX 2D LVIDd:         4.67 cm  Diastology LVIDs:         2.51 cm  LV e' lateral:   7.83 cm/s LV PW:         1.02 cm  LV E/e' lateral: 12.9 LV IVS:        1.11 cm  LV e' medial:    7.29 cm/s LVOT diam:     2.00 cm  LV E/e' medial:  13.9 LV SV:         78 ml LV SV Index:   37.74 LVOT Area:     3.14 cm  RIGHT VENTRICLE RV S prime:     17.80 cm/s TAPSE (M-mode): 2.3 cm LEFT ATRIUM           Index       RIGHT ATRIUM  Index LA diam:      3.30 cm 1.59 cm/m  RA Area:     20.50 cm LA Vol (A2C): 49.3 ml 23.76 ml/m RA Volume:   56.70 ml  27.33 ml/m LA Vol (A4C): 47.6 ml 22.94 ml/m  AORTIC VALVE AV Area (Vmax):    1.68 cm AV Area (Vmean):   1.85 cm AV Area (VTI):     1.79 cm AV Vmax:           204.00 cm/s AV Vmean:          131.000 cm/s AV VTI:            0.408 m AV Peak Grad:      16.6 mmHg AV Mean Grad:      8.0 mmHg LVOT Vmax:         109.00 cm/s LVOT Vmean:        77.100 cm/s LVOT VTI:          0.232 m LVOT/AV VTI ratio: 0.57  AORTA Ao Root diam: 3.40 cm MITRAL VALVE MV Area (PHT): 2.91 cm              SHUNTS MV PHT:        75.69 msec            Systemic VTI:  0.23 m MV Decel Time: 261 msec              Systemic Diam: 2.00 cm MV E velocity: 101.00 cm/s 103 cm/s MV A velocity: 110.00 cm/s 70.3 cm/s MV E/A ratio:  0.92        1.5  Kate Sable MD Electronically signed by Kate Sable MD Signature Date/Time: 05/10/2019/3:35:48 PM    Final     Assessment: 74 year old male admitted with intractable emesis, dysphagia, found to have a near-obstructing ulcerating mass with bleeding and  stigmata of recent bleeding in distal esophagus, s/p biopsy on EGD yesterday. Remains NPO. Surgery planning on J tube placement later this week and port-a-cath. Oncology aware and will be seeing patient later today. Surgical pathology from EGD pending.    Plan: Remain strict NPO Appreciate Surgical consultation and plans for J tube later this week Oncology to see patient today Follow-up on pathology pending Will follow peripherally    Annitta Needs, PhD, ANP-BC [facility]     LOS: 1 day    05/11/2019, 8:02 AM

## 2019-05-12 DIAGNOSIS — C159 Malignant neoplasm of esophagus, unspecified: Secondary | ICD-10-CM | POA: Diagnosis present

## 2019-05-12 DIAGNOSIS — R112 Nausea with vomiting, unspecified: Secondary | ICD-10-CM

## 2019-05-12 LAB — SURGICAL PCR SCREEN
MRSA, PCR: NEGATIVE
Staphylococcus aureus: NEGATIVE

## 2019-05-12 LAB — GLUCOSE, CAPILLARY
Glucose-Capillary: 137 mg/dL — ABNORMAL HIGH (ref 70–99)
Glucose-Capillary: 131 mg/dL — ABNORMAL HIGH (ref 70–99)
Glucose-Capillary: 114 mg/dL — ABNORMAL HIGH (ref 70–99)
Glucose-Capillary: 135 mg/dL — ABNORMAL HIGH (ref 70–99)

## 2019-05-12 LAB — SURGICAL PATHOLOGY

## 2019-05-12 MED ORDER — MUPIROCIN 2 % EX OINT
1.0000 "application " | TOPICAL_OINTMENT | Freq: Two times a day (BID) | CUTANEOUS | Status: DC
  Administered 2019-05-13 – 2019-05-16 (×6): 1 via NASAL
  Filled 2019-05-12 (×2): qty 22

## 2019-05-12 MED ORDER — CHLORHEXIDINE GLUCONATE CLOTH 2 % EX PADS: 6.0000 | MEDICATED_PAD | Freq: Once | CUTANEOUS | Status: DC

## 2019-05-12 MED ORDER — CHLORHEXIDINE GLUCONATE CLOTH 2 % EX PADS
6.0000 | MEDICATED_PAD | Freq: Once | CUTANEOUS | Status: AC
  Administered 2019-05-13: 6 via TOPICAL

## 2019-05-12 MED ORDER — CEFAZOLIN SODIUM-DEXTROSE 2-4 GM/100ML-% IV SOLN
2.0000 g | INTRAVENOUS | Status: DC
  Filled 2019-05-12: qty 100

## 2019-05-12 NOTE — Progress Notes (Signed)
Patient Demographics:    Kurt Duran, is a 74 y.o. male, DOB - 04/13/1945, [id number]  Admit date - 05/09/2019   Admitting Physician Jaycob Mcclenton Denton Brick, MD  Outpatient Primary MD for the patient is Glenda Chroman, MD  LOS - 2   Chief Complaint  Patient presents with  . 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 male with medical history significant of COPD, diabetes mellitus type 2, hypertension, hyperlipidemia, gout, depression, anxiety admitted on 05/09/2019 with intractable emesis, EGD on 05/10/2019 revealed esophageal mass in the lower third, pathology consistent with adenocarcinoma  A/p 1)Esophageal Mass/adenocarcinoma-EGD on 05/10/2019 with mass in the lower third of the esophagus consistent with adenocarcinoma -Discussed with oncologist Dr. Delton Coombes -Discussed with gastroenterologist and endoscopist Dr. Oneida Alar -Discussed with general surgeon Dr. Arnoldo Morale -Plan is for open J-tube procedure to allow for feeding on 05/13/19 -Chemo/radiation and then possibly down the road resection/surgery of the esophageal mass -PortaCath planned for 05/13/19  2)COPD--no acute exacerbation at this time, continue bronchodilators  3)DM2-   Given poor oral intake, hold Metformin and glipizide Use Novolog/Humalog Sliding scale insulin with Accu-Cheks/Fingersticks as ordered   4)DEpression/Anxiety--stable at this time, okay to use IV lorazepam as needed severe anxiety given diagnosis of possible  malignancy  5)FEN--patient unable to tolerate oral intake, continue dextrose solution until  J-tube is placed  -Dietitian consult noted with tube feeding recommendations -Recommend initiating Osmolite 1.2 at 15 ml/hr, advance 15 ml every 8 hrs to goal rate 75 ml/hr  Pro-stat 30 ml daily via tube Tube feed regimen at goal rate 75 ml/hr provides 2260 kcal, 115 grams of protein, and 1476 ml free water  Disposition/Need for in-Hospital Stay- patient unable to be discharged at this time due to --intractable emesis, need for IV fluids/IV nutrition pending further GI/oncology/surgery work-up* --Anticipate discharge home after work-up for esophageal malignancy is completed and nutritional issues/tube feeding resolved--patient currently not able to take oral intake  Code Status :   Family Communication:   (patient is alert, awake and coherent) Discussed with wife   Procedures:- -EGD on 05/10/2019 revealed esophageal mass in the lower third, pathology consistent with adenocarcinoma jejunostomy tube placement, portacath placement on 05/13/19  Consults  :  Gi/Oncology/gen surgery  DVT Prophylaxis  :  TEDs/SCDs   Lab Results  Component Value Date   PLT 147 (L) 05/10/2019    Inpatient Medications  Scheduled Meds: . ALPRAZolam  0.5 mg Oral BID  . Chlorhexidine Gluconate Cloth  6 each Topical Once   And  . Chlorhexidine Gluconate Cloth  6 each Topical Once  . Chlorhexidine Gluconate Cloth  6 each Topical Once   And  . Chlorhexidine Gluconate Cloth  6 each Topical Once  . FLUoxetine  20 mg Oral Daily  .  heparin injection (subcutaneous)  5,000 Units Subcutaneous Q8H  . insulin aspart  0-15 Units Subcutaneous TID WC  . insulin aspart  0-5 Units Subcutaneous QHS  . mupirocin ointment  1 application Nasal BID  . pantoprazole (PROTONIX) IV  40 mg Intravenous BID  . sodium chloride flush  3 mL Intravenous Q12H  . terazosin  10 mg Oral Daily   Continuous Infusions: . sodium chloride    .  [START ON 05/13/2019]  ceFAZolin (ANCEF) IV    . dextrose 5 % and 0.45% NaCl 75 mL/hr at 05/12/19 1028   PRN Meds:.sodium chloride, acetaminophen **OR** acetaminophen, albuterol, alum & mag hydroxide-simeth, bisacodyl, labetalol, LORazepam, metoCLOPramide (REGLAN) injection, ondansetron **OR** ondansetron (ZOFRAN) IV, polyethylene glycol, sodium chloride flush   Anti-infectives (From admission, onward)   Start     Dose/Rate Route Frequency Ordered Stop   05/13/19 0600  ceFAZolin (ANCEF) IVPB 2g/100 mL premix     2 g 200 mL/hr over 30 Minutes Intravenous On call to O.R. 05/12/19 1624 05/14/19 0559        Objective:   Vitals:   05/11/19 2118 05/12/19 0536 05/12/19 0550 05/12/19 1500  BP: (!) 156/76 (!) 157/73  (!) 156/76  Pulse: 69 70  66  Resp: 18 20  20   Temp: 97.7 F (36.5 C) 97.7 F (36.5 C)  98.1 F (36.7 C)  TempSrc: Oral Oral  Oral  SpO2: 100% 100%  100%  Weight:   86.3 kg   Height:        Wt Readings from Last 3 Encounters:  05/12/19 86.3 kg  04/03/16 102.1 kg     Intake/Output Summary (Last 24 hours) at 05/12/2019 1926 Last data filed at 05/12/2019 1700 Gross per 24 hour  Intake 0 ml  Output 550 ml  Net -550 ml    Physical Exam Gen:- Awake Alert,  In no apparent distress  HEENT:- [place].AT, No sclera icterus Neck-Supple Neck,No JVD,.  Lungs-  CTAB , fair symmetrical air movement CV- S1, S2 normal, regular  Abd-  +ve B.Sounds, Abd Soft, No tenderness,    Extremity/Skin:- No  edema, pedal pulses present  Psych-affect is appropriate, oriented x3 Neuro-no new focal deficits, no tremors   Data Review:   Micro Results Recent Results (from the past 240 hour(s))  Respiratory Panel by RT PCR (Flu A&B, Covid) - Nasopharyngeal Swab     Status: None   Collection Time: 05/09/19 10:37 PM   Specimen: Nasopharyngeal Swab  Result Value Ref Range Status   SARS Coronavirus 2 by RT PCR NEGATIVE NEGATIVE Final    Comment: (NOTE) SARS-CoV-2 target nucleic acids are NOT  DETECTED. The SARS-CoV-2 RNA is generally detectable in upper respiratoy specimens during the acute phase of infection. The lowest concentration of SARS-CoV-2 viral copies this assay can detect is 131 copies/mL. A negative result does not preclude SARS-Cov-2 infection and should not be used as the sole basis for treatment or other patient management decisions. A negative result may occur with  improper specimen collection/handling, submission of specimen other than nasopharyngeal swab, presence of viral mutation(s) within the areas targeted by this assay, and inadequate number of viral copies (<131 copies/mL). A negative result must be combined with clinical observations, patient history, and epidemiological information. The expected result is Negative. Fact Sheet for Patients:  PinkCheek.be Fact Sheet for Healthcare Providers:  GravelBags.it This test is not yet ap proved or cleared by the [place] FDA and  has been authorized for detection and/or diagnosis of SARS-CoV-2 by FDA  under an Emergency Use Authorization (EUA). This EUA will remain  in effect (meaning this test can be used) for the duration of the COVID-19 declaration under Section 564(b)(1) of the Act, 21 U.S.C. section 360bbb-3(b)(1), unless the authorization is terminated or revoked sooner.    Influenza A by PCR NEGATIVE NEGATIVE Final   Influenza B by PCR NEGATIVE NEGATIVE Final    Comment: (NOTE) The Xpert Xpress SARS-CoV-2/FLU/RSV assay is intended as an aid in  the diagnosis of influenza from Nasopharyngeal swab specimens and  should not be used as a sole basis for treatment. Nasal washings and  aspirates are unacceptable for Xpert Xpress SARS-CoV-2/FLU/RSV  testing. Fact Sheet for Patients: PinkCheek.be Fact Sheet for Healthcare Providers: GravelBags.it This test is not yet approved or cleared  by the [place] FDA and  has been authorized for detection and/or diagnosis of SARS-CoV-2 by  FDA under an Emergency Use Authorization (EUA). This EUA will remain  in effect (meaning this test can be used) for the duration of the  Covid-19 declaration under Section 564(b)(1) of the Act, 21  U.S.C. section 360bbb-3(b)(1), unless the authorization is  terminated or revoked. Performed at [facility], [address], [place], [place] [address]   Surgical PCR screen     Status: None   Collection Time: 05/12/19  4:38 PM   Specimen: Nasal Mucosa; Nasal Swab  Result Value Ref Range Status   MRSA, PCR NEGATIVE NEGATIVE Final   Staphylococcus aureus NEGATIVE NEGATIVE Final    Comment: (NOTE) The Xpert SA Assay (FDA approved for NASAL specimens in patients 66 years of age and older), is one component of a comprehensive surveillance program. It is not intended to diagnose infection nor to guide or monitor treatment. Performed at [facility], [address], [place], [place] [address]     Radiology Reports CT Chest W Contrast  Result Date: 05/09/2019 CLINICAL DATA:  Nausea and vomiting. EXAM: CT CHEST, ABDOMEN, AND PELVIS WITH CONTRAST TECHNIQUE: Multidetector CT imaging of the chest, abdomen and pelvis was performed following the standard protocol during bolus administration of intravenous contrast. CONTRAST:  159mL OMNIPAQUE IOHEXOL 300 MG/ML  SOLN COMPARISON:  Abdomen pelvis CT, dated January 14, 2007, is available for comparison. FINDINGS: CT CHEST FINDINGS Cardiovascular: There is moderate severity calcification of the thoracic aorta. Normal heart size. No pericardial effusion. Marked severity coronary artery calcification is seen. Mediastinum/Nodes: No enlarged mediastinal, hilar, or axillary lymph nodes. Small cystic structures are seen within both lobes of the thyroid gland. Dilatation of the esophagus is seen which measures approximately 2.7 cm x 1.8 cm. A small hiatal hernia is noted.  Lungs/Pleura: Lungs are clear. No pleural effusion or pneumothorax. Musculoskeletal: Multilevel degenerative changes seen throughout the thoracic spine. CT ABDOMEN PELVIS FINDINGS Hepatobiliary: No focal liver abnormality is seen. No gallstones, gallbladder wall thickening, or biliary dilatation. Pancreas: Unremarkable. No pancreatic ductal dilatation or surrounding inflammatory changes. Spleen: Normal in size without focal abnormality. Adrenals/Urinary Tract: The right adrenal gland is normal in appearance. 0.6 cm 1.0 cm fat density left adrenal masses are seen. Kidneys are normal, without renal calculi or hydronephrosis. A 5 mm cyst is seen within the posterolateral aspect of the mid right kidney. Bladder is unremarkable. Stomach/Bowel: Stomach is within normal limits. Appendix appears normal. No evidence of bowel wall thickening, distention, or inflammatory changes. Noninflamed diverticula is seen throughout the descending colon. Vascular/Lymphatic: Moderate to marked severity aortic atherosclerosis. No enlarged abdominal or pelvic lymph nodes. Reproductive: Prostate is unremarkable. Other: No abdominal wall  hernia or abnormality. No abdominopelvic ascites. Musculoskeletal: A predominant stable 9 mm sclerotic focus is seen within the posterior aspect of the sacrum on the left. Degenerative changes seen throughout the lumbar spine. IMPRESSION: 1. No evidence of acute or active cardiopulmonary disease. 2. Small hiatal hernia. 3. Small left adrenal myelolipomas. 4. Noninflamed diverticula of the descending colon. Electronically Signed   By: Virgina Norfolk M.D.   On: 05/09/2019 22:07   CT ABDOMEN PELVIS W CONTRAST  Result Date: 05/09/2019 CLINICAL DATA:  Nausea and vomiting. EXAM: CT CHEST, ABDOMEN, AND PELVIS WITH CONTRAST TECHNIQUE: Multidetector CT imaging of the chest, abdomen and pelvis was performed following the standard protocol during bolus administration of intravenous contrast. CONTRAST:  151mL  OMNIPAQUE IOHEXOL 300 MG/ML  SOLN COMPARISON:  Abdomen pelvis CT, dated January 14, 2007, is available for comparison. FINDINGS: CT CHEST FINDINGS Cardiovascular: There is moderate severity calcification of the thoracic aorta. Normal heart size. No pericardial effusion. Marked severity coronary artery calcification is seen. Mediastinum/Nodes: No enlarged mediastinal, hilar, or axillary lymph nodes. Small cystic structures are seen within both lobes of the thyroid gland. Dilatation of the esophagus is seen which measures approximately 2.7 cm x 1.8 cm. A small hiatal hernia is noted. Lungs/Pleura: Lungs are clear. No pleural effusion or pneumothorax. Musculoskeletal: Multilevel degenerative changes seen throughout the thoracic spine. CT ABDOMEN PELVIS FINDINGS Hepatobiliary: No focal liver abnormality is seen. No gallstones, gallbladder wall thickening, or biliary dilatation. Pancreas: Unremarkable. No pancreatic ductal dilatation or surrounding inflammatory changes. Spleen: Normal in size without focal abnormality. Adrenals/Urinary Tract: The right adrenal gland is normal in appearance. 0.6 cm 1.0 cm fat density left adrenal masses are seen. Kidneys are normal, without renal calculi or hydronephrosis. A 5 mm cyst is seen within the posterolateral aspect of the mid right kidney. Bladder is unremarkable. Stomach/Bowel: Stomach is within normal limits. Appendix appears normal. No evidence of bowel wall thickening, distention, or inflammatory changes. Noninflamed diverticula is seen throughout the descending colon. Vascular/Lymphatic: Moderate to marked severity aortic atherosclerosis. No enlarged abdominal or pelvic lymph nodes. Reproductive: Prostate is unremarkable. Other: No abdominal wall hernia or abnormality. No abdominopelvic ascites. Musculoskeletal: A predominant stable 9 mm sclerotic focus is seen within the posterior aspect of the sacrum on the left. Degenerative changes seen throughout the lumbar spine.  IMPRESSION: 1. No evidence of acute or active cardiopulmonary disease. 2. Small hiatal hernia. 3. Small left adrenal myelolipomas. 4. Noninflamed diverticula of the descending colon. Electronically Signed   By: Virgina Norfolk M.D.   On: 05/09/2019 22:07   ECHOCARDIOGRAM COMPLETE  Result Date: 05/10/2019   ECHOCARDIOGRAM REPORT   Patient Name:   IZAHA OROSCO Date of Exam: 05/10/2019 Medical Rec #:  [id number]       Height:       73.0 in Accession #:    [id number]      Weight:       183.6 lb Date of Birth:  03-04-1946       BSA:          2.08 m Patient Age:    45 years        BP:           161/84 mmHg Patient Gender: M               HR:           81 bpm. Exam Location:  [facility] Procedure: 2D Echo, Cardiac Doppler and Color Doppler Indications:    Dyspnea 786.09 /  R06.00  History:        Patient has no prior history of Echocardiogram examinations.                 COPD; Risk Factors:Hypertension and Diabetes. GERD.  Sonographer:    Alvino Chapel RCS Referring Phys: [phone] Shoshanah Dapper IMPRESSIONS  1. Left ventricular ejection fraction, by visual estimation, is 60 to 65%. The left ventricle has normal function. There is mildly increased left ventricular hypertrophy.  2. Left ventricular diastolic parameters are consistent with Grade I diastolic dysfunction (impaired relaxation).  3. The left ventricle has no regional wall motion abnormalities.  4. Global right ventricle has normal systolic function.The right ventricular size is normal. No increase in right ventricular wall thickness.  5. Left atrial size was normal.  6. Right atrial size was normal.  7. Mild mitral annular calcification.  8. The mitral valve is grossly normal. Trivial mitral valve regurgitation.  9. The tricuspid valve is grossly normal. 10. The tricuspid valve is grossly normal. Tricuspid valve regurgitation is trivial. 11. The aortic valve is tricuspid. Aortic valve regurgitation is not visualized. Mild aortic valve stenosis. 12. The  pulmonic valve was not well visualized. Pulmonic valve regurgitation is not visualized. 13. The inferior vena cava is normal in size with greater than 50% respiratory variability, suggesting right atrial pressure of 3 mmHg. FINDINGS  Left Ventricle: Left ventricular ejection fraction, by visual estimation, is 60 to 65%. The left ventricle has normal function. The left ventricle has no regional wall motion abnormalities. The left ventricular internal cavity size was the left ventricle is normal in size. There is mildly increased left ventricular hypertrophy. Concentric left ventricular hypertrophy. Left ventricular diastolic parameters are consistent with Grade I diastolic dysfunction (impaired relaxation). Indeterminate filling pressures. Right Ventricle: The right ventricular size is normal. No increase in right ventricular wall thickness. Global RV systolic function is has normal systolic function. Left Atrium: Left atrial size was normal in size. Right Atrium: Right atrial size was normal in size Pericardium: There is no evidence of pericardial effusion. Mitral Valve: The mitral valve is grossly normal. Mild mitral annular calcification. Trivial mitral valve regurgitation. Tricuspid Valve: The tricuspid valve is grossly normal. Tricuspid valve regurgitation is trivial. Aortic Valve: The aortic valve is tricuspid. . There is mild thickening of the aortic valve. Aortic valve regurgitation is not visualized. Mild aortic stenosis is present. Mild aortic valve annular calcification. There is mild thickening of the aortic valve. Aortic valve mean gradient measures 8.0 mmHg. Aortic valve peak gradient measures 16.6 mmHg. Aortic valve area, by VTI measures 1.79 cm. Pulmonic Valve: The pulmonic valve was not well visualized. Pulmonic valve regurgitation is not visualized. Pulmonic regurgitation is not visualized. Aorta: The aortic root is normal in size and structure. Venous: The inferior vena cava is normal in size with  greater than 50% respiratory variability, suggesting right atrial pressure of 3 mmHg. IAS/Shunts: No atrial level shunt detected by color flow Doppler.  LEFT VENTRICLE PLAX 2D LVIDd:         4.67 cm  Diastology LVIDs:         2.51 cm  LV e' lateral:   7.83 cm/s LV PW:         1.02 cm  LV E/e' lateral: 12.9 LV IVS:        1.11 cm  LV e' medial:    7.29 cm/s LVOT diam:     2.00 cm  LV E/e' medial:  13.9 LV SV:  78 ml LV SV Index:   37.74 LVOT Area:     3.14 cm  RIGHT VENTRICLE RV S prime:     17.80 cm/s TAPSE (M-mode): 2.3 cm LEFT ATRIUM           Index       RIGHT ATRIUM           Index LA diam:      3.30 cm 1.59 cm/m  RA Area:     20.50 cm LA Vol (A2C): 49.3 ml 23.76 ml/m RA Volume:   56.70 ml  27.33 ml/m LA Vol (A4C): 47.6 ml 22.94 ml/m  AORTIC VALVE AV Area (Vmax):    1.68 cm AV Area (Vmean):   1.85 cm AV Area (VTI):     1.79 cm AV Vmax:           204.00 cm/s AV Vmean:          131.000 cm/s AV VTI:            0.408 m AV Peak Grad:      16.6 mmHg AV Mean Grad:      8.0 mmHg LVOT Vmax:         109.00 cm/s LVOT Vmean:        77.100 cm/s LVOT VTI:          0.232 m LVOT/AV VTI ratio: 0.57  AORTA Ao Root diam: 3.40 cm MITRAL VALVE MV Area (PHT): 2.91 cm              SHUNTS MV PHT:        75.69 msec            Systemic VTI:  0.23 m MV Decel Time: 261 msec              Systemic Diam: 2.00 cm MV E velocity: 101.00 cm/s 103 cm/s MV A velocity: 110.00 cm/s 70.3 cm/s MV E/A ratio:  0.92        1.5  Kate Sable MD Electronically signed by Kate Sable MD Signature Date/Time: 05/10/2019/3:35:48 PM    Final      CBC Recent Labs  Lab 05/09/19 1923 05/10/19 0643  WBC 6.7 5.9  HGB 15.7 14.0  HCT 48.7 44.0  PLT 170 147*  MCV 90.4 90.9  MCH 29.1 28.9  MCHC 32.2 31.8  RDW 14.4 14.5    Chemistries  Recent Labs  Lab 05/09/19 1923 05/10/19 0643 05/11/19 0820  NA 144 145 142  K 4.1 3.9 3.4*  CL 103 107 105  CO2 26 26 25   GLUCOSE 179* 147* 143*  BUN 28* 23 18  CREATININE 1.03 0.90  0.84  CALCIUM 9.6 8.9 8.7*  AST 16  --  17  ALT 20  --  18  ALKPHOS 72  --  68  BILITOT 1.0  --  0.9   ------------------------------------------------------------------------------------------------------------------ No results for input(s): CHOL, HDL, LDLCALC, TRIG, CHOLHDL, LDLDIRECT in the last 72 hours.  Lab Results  Component Value Date   HGBA1C 7.1 (H) 05/11/2019   ------------------------------------------------------------------------------------------------------------------ No results for input(s): TSH, T4TOTAL, T3FREE, THYROIDAB in the last 72 hours.  Invalid input(s): FREET3 ------------------------------------------------------------------------------------------------------------------ No results for input(s): VITAMINB12, FOLATE, FERRITIN, TIBC, IRON, RETICCTPCT in the last 72 hours.  Coagulation profile No results for input(s): INR, PROTIME in the last 168 hours.  No results for input(s): DDIMER in the last 72 hours.  Cardiac Enzymes No results for input(s): CKMB, TROPONINI, MYOGLOBIN in the last 168 hours.  Invalid input(s): CK ------------------------------------------------------------------------------------------------------------------  No results found for: BNP   Roxan Hockey M.D on 05/12/2019 at 7:26 PM  Go to [email or web address] - for contact info  [facility] - Office  [phone]

## 2019-05-12 NOTE — Consult Note (Signed)
[facility] Consultation Oncology  Name: Kurt Duran      MRN: [id number]    Location: [id number]  Date: 05/12/2019 Time:7:36 AM   REFERRING PHYSICIAN: Dr. Joesph Fillers  REASON FOR CONSULT: GE junction mass.   DIAGNOSIS: GE junction carcinoma pending pathology report.  HISTORY OF PRESENT ILLNESS: [name] is a 74 year old very pleasant white male who is seen in consultation today for further work-up and management of distal esophageal mass.  He reported 1 month history of dysphagia.  He had acid reflux for a long time and was treated with medication and his symptoms improved.  However for the past 1 month they have gotten worse and he lost about 25 pounds in 4 weeks.  Denies any fevers or night sweats.  Denies any headaches or vision changes.  He lives at home with his wife in [place].  He walks with a cane because of his right knee problem.  He is able to do all his ADLs and IADLs.  He came to the ER and a CT scan of the CAP showed distal esophageal thickening.  As he was not able to swallow anything, he was admitted to the hospital.  EGD by Dr. Oneida Alar showed ulcerated mass in the distal esophagus extending from 37-39 cm from incisors.  This is partially obstructing.  Biopsies were sent.  He was evaluated by Dr. Arnoldo Morale for feeding tube placement as he is not able to swallow even his sputum.  He denies any prior history of heart attacks or strokes.  He worked in [employer] in [place] prior to retirement.  He quit smoking more than 40 years ago and smoked 2 packs/day for 8 years.  He drinks alcohol occasionally.  No known family history of malignancies as patient was adopted.  PAST MEDICAL HISTORY:   Past Medical History:  Diagnosis Date  . Anxiety   . COPD (chronic obstructive pulmonary disease) ([place])   . Diabetes mellitus without complication ([place])   . Diabetic retinopathy ([place])    NPDR OU  . GERD (gastroesophageal reflux disease)   . Gout   . Hypertension   . Hypertensive  retinopathy    OU    ALLERGIES: Allergies  Allergen Reactions  . Eggs Or Egg-Derived Products Nausea And Vomiting      MEDICATIONS: I have reviewed the patient's current medications.     PAST SURGICAL HISTORY Past Surgical History:  Procedure Laterality Date  . BIOPSY  05/10/2019   Procedure: BIOPSY;  Surgeon: Danie Binder, MD;  Location: [facility] END[facility] SUITE;  Service: Endoscopy;;  gastric esophageal  . CATARACT EXTRACTION W/PHACO Left 04/03/2016   Procedure: CATARACT EXTRACTION PHACO AND INTRAOCULAR LENS PLACEMENT LEFT EYE CDE= 11.33;  Surgeon: Tonny Branch, MD;  Location: [facility] ORS;  Service: Ophthalmology;  Laterality: Left;  left  . CATARACT EXTRACTION W/PHACO Right 04/28/2016   Procedure: CATARACT EXTRACTION PHACO AND INTRAOCULAR LENS PLACEMENT (IOC);  Surgeon: Tonny Branch, MD;  Location: [facility] ORS;  Service: Ophthalmology;  Laterality: Right;  [id number]   . ESOPHAGOGASTRODUODENOSCOPY (EGD) WITH PROPOFOL N/A 05/10/2019   Procedure: ESOPHAGOGASTRODUODENOSCOPY (EGD) WITH PROPOFOL with dilation as appropriate.;  Surgeon: Danie Binder, MD;  Location: [facility];  Service: Endoscopy;  Laterality: N/A;  . EYE SURGERY Bilateral    Cat Sx OU  . KNEE ARTHROSCOPY Right   . NECK SURGERY    . ROTATOR CUFF REPAIR Right     FAMILY HISTORY: Family History  Adopted: Yes    SOCIAL HISTORY:  reports  that he quit smoking about 30 years ago. His smoking use included cigarettes. He has never used smokeless tobacco. He reports that he does not drink alcohol or use drugs.  PERFORMANCE STATUS: The patient's performance status is 1 - Symptomatic but completely ambulatory  PHYSICAL EXAM: Most Recent Vital Signs: Blood pressure (!) 157/73, pulse 70, temperature 97.7 F (36.5 C), temperature source Oral, resp. rate 20, height 6\' 1"  (1.854 m), weight 190 lb 4.1 oz (86.3 kg), SpO2 100 %. BP (!) 157/73 (BP Location: Left Arm)   Pulse 70   Temp 97.7 F (36.5 C) (Oral)   Resp 20   Ht 6\' 1"  (1.854 m)    Wt 190 lb 4.1 oz (86.3 kg)   SpO2 100%   BMI 25.10 kg/m  General appearance: alert, cooperative and appears stated age Head: Normocephalic, without obvious abnormality, atraumatic Neck: no adenopathy and supple, symmetrical, trachea midline Lungs: clear to auscultation bilaterally Heart: regular rate and rhythm Abdomen: soft, non-tender; bowel sounds normal; no masses,  no organomegaly Extremities: extremities normal, atraumatic, no cyanosis or edema Skin: Skin color, texture, turgor normal. No rashes or lesions Lymph nodes: Cervical, supraclavicular, and axillary nodes normal. Neurologic: Grossly normal  LABORATORY DATA:  Results for orders placed or performed during the hospital encounter of 05/09/19 (from the past 48 hour(s))  Glucose, capillary     Status: Abnormal   Collection Time: 05/10/19 11:17 AM  Result Value Ref Range   Glucose-Capillary 126 (H) 70 - 99 mg/dL  Glucose, capillary     Status: Abnormal   Collection Time: 05/10/19 12:51 PM  Result Value Ref Range   Glucose-Capillary 121 (H) 70 - 99 mg/dL  Glucose, capillary     Status: Abnormal   Collection Time: 05/10/19  2:13 PM  Result Value Ref Range   Glucose-Capillary 114 (H) 70 - 99 mg/dL  Glucose, capillary     Status: Abnormal   Collection Time: 05/10/19 11:50 PM  Result Value Ref Range   Glucose-Capillary 131 (H) 70 - 99 mg/dL   Comment 1 Notify RN    Comment 2 Document in Chart   Glucose, capillary     Status: Abnormal   Collection Time: 05/11/19  5:03 AM  Result Value Ref Range   Glucose-Capillary 120 (H) 70 - 99 mg/dL   Comment 1 Notify RN   Hemoglobin A1c     Status: Abnormal   Collection Time: 05/11/19  5:59 AM  Result Value Ref Range   Hgb A1c MFr Bld 7.1 (H) 4.8 - 5.6 %    Comment: (NOTE) Pre diabetes:          5.7%-6.4% Diabetes:              >6.4% Glycemic control for   <7.0% adults with diabetes    Mean Plasma Glucose 157.07 mg/dL    Comment: Performed at [facility], [address], [place], [place] [address]  Comprehensive metabolic panel     Status: Abnormal   Collection Time: 05/11/19  8:20 AM  Result Value Ref Range   Sodium 142 135 - 145 mmol/L   Potassium 3.4 (L) 3.5 - 5.1 mmol/L   Chloride 105 98 - 111 mmol/L   CO2 25 22 - 32 mmol/L   Glucose, Bld 143 (H) 70 - 99 mg/dL   BUN 18 8 - 23 mg/dL   Creatinine, Ser 0.84 0.61 - 1.24 mg/dL   Calcium 8.7 (L) 8.9 - 10.3 mg/dL   Total Protein 6.8 6.5 -  8.1 g/dL   Albumin 4.1 3.5 - 5.0 g/dL   AST 17 15 - 41 U/L   ALT 18 0 - 44 U/L   Alkaline Phosphatase 68 38 - 126 U/L   Total Bilirubin 0.9 0.3 - 1.2 mg/dL   GFR calc non Af Amer >60 >60 mL/min   GFR calc Af Amer >60 >60 mL/min   Anion gap 12 5 - 15    Comment: Performed at [facility], [address], [place], [place] [address]  Glucose, capillary     Status: Abnormal   Collection Time: 05/11/19 11:39 AM  Result Value Ref Range   Glucose-Capillary 128 (H) 70 - 99 mg/dL  Glucose, capillary     Status: Abnormal   Collection Time: 05/11/19  5:29 PM  Result Value Ref Range   Glucose-Capillary 119 (H) 70 - 99 mg/dL   Comment 1 Notify RN    Comment 2 Document in Chart   Glucose, capillary     Status: Abnormal   Collection Time: 05/11/19  9:19 PM  Result Value Ref Range   Glucose-Capillary 119 (H) 70 - 99 mg/dL      RADIOGRAPHY: ECHOCARDIOGRAM COMPLETE  Result Date: 05/10/2019   ECHOCARDIOGRAM REPORT   Patient Name:   AKIEL MARES Date of Exam: 05/10/2019 Medical Rec #:  [id number]       Height:       73.0 in Accession #:    [id number]      Weight:       183.6 lb Date of Birth:  1945-05-05       BSA:          2.08 m Patient Age:    70 years        BP:           161/84 mmHg Patient Gender: M               HR:           81 bpm. Exam Location:  [facility] Procedure: 2D Echo, Cardiac Doppler and Color Doppler Indications:    Dyspnea 786.09 / R06.00  History:        Patient has no prior history of Echocardiogram examinations.                 COPD; Risk  Factors:Hypertension and Diabetes. GERD.  Sonographer:    Alvino Chapel RCS Referring Phys: [phone] COURAGE EMOKPAE IMPRESSIONS  1. Left ventricular ejection fraction, by visual estimation, is 60 to 65%. The left ventricle has normal function. There is mildly increased left ventricular hypertrophy.  2. Left ventricular diastolic parameters are consistent with Grade I diastolic dysfunction (impaired relaxation).  3. The left ventricle has no regional wall motion abnormalities.  4. Global right ventricle has normal systolic function.The right ventricular size is normal. No increase in right ventricular wall thickness.  5. Left atrial size was normal.  6. Right atrial size was normal.  7. Mild mitral annular calcification.  8. The mitral valve is grossly normal. Trivial mitral valve regurgitation.  9. The tricuspid valve is grossly normal. 10. The tricuspid valve is grossly normal. Tricuspid valve regurgitation is trivial. 11. The aortic valve is tricuspid. Aortic valve regurgitation is not visualized. Mild aortic valve stenosis. 12. The pulmonic valve was not well visualized. Pulmonic valve regurgitation is not visualized. 13. The inferior vena cava is normal in size with greater than 50% respiratory variability, suggesting right atrial pressure of 3 mmHg. FINDINGS  Left Ventricle: Left ventricular  ejection fraction, by visual estimation, is 60 to 65%. The left ventricle has normal function. The left ventricle has no regional wall motion abnormalities. The left ventricular internal cavity size was the left ventricle is normal in size. There is mildly increased left ventricular hypertrophy. Concentric left ventricular hypertrophy. Left ventricular diastolic parameters are consistent with Grade I diastolic dysfunction (impaired relaxation). Indeterminate filling pressures. Right Ventricle: The right ventricular size is normal. No increase in right ventricular wall thickness. Global RV systolic function is has normal  systolic function. Left Atrium: Left atrial size was normal in size. Right Atrium: Right atrial size was normal in size Pericardium: There is no evidence of pericardial effusion. Mitral Valve: The mitral valve is grossly normal. Mild mitral annular calcification. Trivial mitral valve regurgitation. Tricuspid Valve: The tricuspid valve is grossly normal. Tricuspid valve regurgitation is trivial. Aortic Valve: The aortic valve is tricuspid. . There is mild thickening of the aortic valve. Aortic valve regurgitation is not visualized. Mild aortic stenosis is present. Mild aortic valve annular calcification. There is mild thickening of the aortic valve. Aortic valve mean gradient measures 8.0 mmHg. Aortic valve peak gradient measures 16.6 mmHg. Aortic valve area, by VTI measures 1.79 cm. Pulmonic Valve: The pulmonic valve was not well visualized. Pulmonic valve regurgitation is not visualized. Pulmonic regurgitation is not visualized. Aorta: The aortic root is normal in size and structure. Venous: The inferior vena cava is normal in size with greater than 50% respiratory variability, suggesting right atrial pressure of 3 mmHg. IAS/Shunts: No atrial level shunt detected by color flow Doppler.  LEFT VENTRICLE PLAX 2D LVIDd:         4.67 cm  Diastology LVIDs:         2.51 cm  LV e' lateral:   7.83 cm/s LV PW:         1.02 cm  LV E/e' lateral: 12.9 LV IVS:        1.11 cm  LV e' medial:    7.29 cm/s LVOT diam:     2.00 cm  LV E/e' medial:  13.9 LV SV:         78 ml LV SV Index:   37.74 LVOT Area:     3.14 cm  RIGHT VENTRICLE RV S prime:     17.80 cm/s TAPSE (M-mode): 2.3 cm LEFT ATRIUM           Index       RIGHT ATRIUM           Index LA diam:      3.30 cm 1.59 cm/m  RA Area:     20.50 cm LA Vol (A2C): 49.3 ml 23.76 ml/m RA Volume:   56.70 ml  27.33 ml/m LA Vol (A4C): 47.6 ml 22.94 ml/m  AORTIC VALVE AV Area (Vmax):    1.68 cm AV Area (Vmean):   1.85 cm AV Area (VTI):     1.79 cm AV Vmax:           204.00 cm/s AV  Vmean:          131.000 cm/s AV VTI:            0.408 m AV Peak Grad:      16.6 mmHg AV Mean Grad:      8.0 mmHg LVOT Vmax:         109.00 cm/s LVOT Vmean:        77.100 cm/s LVOT VTI:          0.232 m  LVOT/AV VTI ratio: 0.57  AORTA Ao Root diam: 3.40 cm MITRAL VALVE MV Area (PHT): 2.91 cm              SHUNTS MV PHT:        75.69 msec            Systemic VTI:  0.23 m MV Decel Time: 261 msec              Systemic Diam: 2.00 cm MV E velocity: 101.00 cm/s 103 cm/s MV A velocity: 110.00 cm/s 70.3 cm/s MV E/A ratio:  0.92        1.5  Kate Sable MD Electronically signed by Kate Sable MD Signature Date/Time: 05/10/2019/3:35:48 PM    Final        PATHOLOGY: Consistent with adenocarcinoma of the esophageal mass.  ASSESSMENT and PLAN:  1.  Distal esophageal adenocarcinoma: -Reported 1 month history of dysphagia with 25 pound weight loss in the last 1 month. -CT scan of the CAP for nausea and vomiting on 05/09/2019 showed dilation of the esophagus which measures approximately 2.7 cm x 1.8 cm.  No clear evidence of metastatic disease. -EGD on 05/10/2019 showed medium-sized ulcerating mass in the distal esophagus, 37-39 cm from the incisors.  Mass was partially obstructing and circumferential.  GE junction 40 cm from the incisors.  Congestion, erosions and erythema was found in the entire examined stomach.  Second part of duodenum was normal. -Pathology report is pending at this time. -He is fairly functional and lives at home with his wife.  He is not able to swallow even his sputum.  Hence placement of a J-tube was recommended. -I had a prolonged discussion about his diagnosis and treatment options and prognosis.  We will arrange for outpatient PET CT scan.  We will plan for EUS and preoperative chemoradiation.  He will also need a port placement. -I have also reached out to Dr. Barry Dienes in [place].  Once he is discharged, I will make referral to Drs. Lightfoot/Gerhardt, CT surgery prior to start of  systemic therapy.  All questions were answered. The patient knows to call the clinic with any problems, questions or concerns. We can certainly see the patient much sooner if necessary.   Derek Jack

## 2019-05-12 NOTE — Progress Notes (Signed)
2 Days Post-Op  Subjective: Patient has no complaints.  Objective: Vital Duran in last 24 hours: Temp:  [97.7 F (36.5 C)-97.8 F (36.6 C)] 97.7 F (36.5 C) (02/04 0536) Pulse Rate:  [69-75] 70 (02/04 0536) Resp:  [18-20] 20 (02/04 0536) BP: (154-157)/(72-76) 157/73 (02/04 0536) SpO2:  [99 %-100 %] 100 % (02/04 0536) Weight:  [86.3 kg] 86.3 kg (02/04 0550) Last BM Date: 05/05/19  Intake/Output from previous day: 02/03 0701 - 02/04 0700 In: 559.7 [I.V.:559.7] Out: 300 [Urine:300] Intake/Output this shift: Total I/O In: 0  Out: 150 [Urine:150]  General appearance: alert, cooperative and no distress  Lab Results:  Recent Labs    05/09/19 1923 05/10/19 0643  WBC 6.7 5.9  HGB 15.7 14.0  HCT 48.7 44.0  PLT 170 147*   BMET Recent Labs    05/10/19 0643 05/11/19 0820  NA 145 142  K 3.9 3.4*  CL 107 105  CO2 26 25  GLUCOSE 147* 143*  BUN 23 18  CREATININE 0.90 0.84  CALCIUM 8.9 8.7*   PT/INR No results for input(s): LABPROT, INR in the last 72 hours.  Studies/Results: ECHOCARDIOGRAM COMPLETE  Result Date: 05/10/2019   ECHOCARDIOGRAM REPORT   Patient Name:   Kurt Duran Date of Exam: 05/10/2019 Medical Rec #:  [id number]       Height:       73.0 in Accession #:    [id number]      Weight:       183.6 lb Date of Birth:  1945-05-14       BSA:          2.08 m Patient Age:    74 years        BP:           161/84 mmHg Patient Gender: M               HR:           81 bpm. Exam Location:  [facility] Procedure: 2D Echo, Cardiac Doppler and Color Doppler Indications:    Dyspnea 786.09 / R06.00  History:        Patient has no prior history of Echocardiogram examinations.                 COPD; Risk Factors:Hypertension and Diabetes. GERD.  Sonographer:    Alvino Chapel RCS Referring Phys: [phone] COURAGE EMOKPAE IMPRESSIONS  1. Left ventricular ejection fraction, by visual estimation, is 60 to 65%. The left ventricle has normal function. There is mildly increased left ventricular  hypertrophy.  2. Left ventricular diastolic parameters are consistent with Grade I diastolic dysfunction (impaired relaxation).  3. The left ventricle has no regional wall motion abnormalities.  4. Global right ventricle has normal systolic function.The right ventricular size is normal. No increase in right ventricular wall thickness.  5. Left atrial size was normal.  6. Right atrial size was normal.  7. Mild mitral annular calcification.  8. The mitral valve is grossly normal. Trivial mitral valve regurgitation.  9. The tricuspid valve is grossly normal. 10. The tricuspid valve is grossly normal. Tricuspid valve regurgitation is trivial. 11. The aortic valve is tricuspid. Aortic valve regurgitation is not visualized. Mild aortic valve stenosis. 12. The pulmonic valve was not well visualized. Pulmonic valve regurgitation is not visualized. 13. The inferior vena cava is normal in size with greater than 50% respiratory variability, suggesting right atrial pressure of 3 mmHg. FINDINGS  Left Ventricle: Left ventricular ejection fraction, by visual  estimation, is 60 to 65%. The left ventricle has normal function. The left ventricle has no regional wall motion abnormalities. The left ventricular internal cavity size was the left ventricle is normal in size. There is mildly increased left ventricular hypertrophy. Concentric left ventricular hypertrophy. Left ventricular diastolic parameters are consistent with Grade I diastolic dysfunction (impaired relaxation). Indeterminate filling pressures. Right Ventricle: The right ventricular size is normal. No increase in right ventricular wall thickness. Global RV systolic function is has normal systolic function. Left Atrium: Left atrial size was normal in size. Right Atrium: Right atrial size was normal in size Pericardium: There is no evidence of pericardial effusion. Mitral Valve: The mitral valve is grossly normal. Mild mitral annular calcification. Trivial mitral valve  regurgitation. Tricuspid Valve: The tricuspid valve is grossly normal. Tricuspid valve regurgitation is trivial. Aortic Valve: The aortic valve is tricuspid. . There is mild thickening of the aortic valve. Aortic valve regurgitation is not visualized. Mild aortic stenosis is present. Mild aortic valve annular calcification. There is mild thickening of the aortic valve. Aortic valve mean gradient measures 8.0 mmHg. Aortic valve peak gradient measures 16.6 mmHg. Aortic valve area, by VTI measures 1.79 cm. Pulmonic Valve: The pulmonic valve was not well visualized. Pulmonic valve regurgitation is not visualized. Pulmonic regurgitation is not visualized. Aorta: The aortic root is normal in size and structure. Venous: The inferior vena cava is normal in size with greater than 50% respiratory variability, suggesting right atrial pressure of 3 mmHg. IAS/Shunts: No atrial level shunt detected by color flow Doppler.  LEFT VENTRICLE PLAX 2D LVIDd:         4.67 cm  Diastology LVIDs:         2.51 cm  LV e' lateral:   7.83 cm/s LV PW:         1.02 cm  LV E/e' lateral: 12.9 LV IVS:        1.11 cm  LV e' medial:    7.29 cm/s LVOT diam:     2.00 cm  LV E/e' medial:  13.9 LV SV:         78 ml LV SV Index:   37.74 LVOT Area:     3.14 cm  RIGHT VENTRICLE RV S prime:     17.80 cm/s TAPSE (M-mode): 2.3 cm LEFT ATRIUM           Index       RIGHT ATRIUM           Index LA diam:      3.30 cm 1.59 cm/m  RA Area:     20.50 cm LA Vol (A2C): 49.3 ml 23.76 ml/m RA Volume:   56.70 ml  27.33 ml/m LA Vol (A4C): 47.6 ml 22.94 ml/m  AORTIC VALVE AV Area (Vmax):    1.68 cm AV Area (Vmean):   1.85 cm AV Area (VTI):     1.79 cm AV Vmax:           204.00 cm/s AV Vmean:          131.000 cm/s AV VTI:            0.408 m AV Peak Grad:      16.6 mmHg AV Mean Grad:      8.0 mmHg LVOT Vmax:         109.00 cm/s LVOT Vmean:        77.100 cm/s LVOT VTI:          0.232 m LVOT/AV VTI ratio: 0.57  AORTA Ao Root diam: 3.40 cm MITRAL VALVE MV Area (PHT):  2.91 cm              SHUNTS MV PHT:        75.69 msec            Systemic VTI:  0.23 m MV Decel Time: 261 msec              Systemic Diam: 2.00 cm MV E velocity: 101.00 cm/s 103 cm/s MV A velocity: 110.00 cm/s 70.3 cm/s MV E/A ratio:  0.92        1.5  Kate Sable MD Electronically signed by Kate Sable MD Signature Date/Time: 05/10/2019/3:35:48 PM    Final     Anti-infectives: Anti-infectives (From admission, onward)   None      Assessment/Plan: 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  The risks and benefits of both procedures including bleeding, infection, pneumothorax, and dislodgment of the jejunostomy tube were fully explained to the patient, who gave informed consent.  LOS: 2 days    Kurt Duran 05/12/2019

## 2019-05-12 NOTE — Plan of Care (Signed)
SPOKE WITH PT'S WIFE. AWARE PT HAS ESOPHAGEAL CANCER. WILL AWAIT ONCOLOGY RECOMMENDATIONS.

## 2019-05-12 NOTE — Plan of Care (Signed)

## 2019-05-12 NOTE — H&P (View-Only) (Signed)
2 Days Post-Op  Subjective: Patient has no complaints.  Objective: Vital signs in last 24 hours: Temp:  [97.7 F (36.5 C)-97.8 F (36.6 C)] 97.7 F (36.5 C) (02/04 0536) Pulse Rate:  [69-75] 70 (02/04 0536) Resp:  [18-20] 20 (02/04 0536) BP: (154-157)/(72-76) 157/73 (02/04 0536) SpO2:  [99 %-100 %] 100 % (02/04 0536) Weight:  [86.3 kg] 86.3 kg (02/04 0550) Last BM Date: 05/05/19  Intake/Output from previous day: 02/03 0701 - 02/04 0700 In: 559.7 [I.V.:559.7] Out: 300 [Urine:300] Intake/Output this shift: Total I/O In: 0  Out: 150 [Urine:150]  General appearance: alert, cooperative and no distress  Lab Results:  Recent Labs    05/09/19 1923 05/10/19 0643  WBC 6.7 5.9  HGB 15.7 14.0  HCT 48.7 44.0  PLT 170 147*   BMET Recent Labs    05/10/19 0643 05/11/19 0820  NA 145 142  K 3.9 3.4*  CL 107 105  CO2 26 25  GLUCOSE 147* 143*  BUN 23 18  CREATININE 0.90 0.84  CALCIUM 8.9 8.7*   PT/INR No results for input(s): LABPROT, INR in the last 72 hours.  Studies/Results: ECHOCARDIOGRAM COMPLETE  Result Date: 05/10/2019   ECHOCARDIOGRAM REPORT   Patient Name:   Kurt Duran Date of Exam: 05/10/2019 Medical Rec #:  [id number]       Height:       73.0 in Accession #:    [id number]      Weight:       183.6 lb Date of Birth:  1946/01/22       BSA:          2.08 m Patient Age:    74 years        BP:           161/84 mmHg Patient Gender: M               HR:           81 bpm. Exam Location:  [facility] Procedure: 2D Echo, Cardiac Doppler and Color Doppler Indications:    Dyspnea 786.09 / R06.00  History:        Patient has no prior history of Echocardiogram examinations.                 COPD; Risk Factors:Hypertension and Diabetes. GERD.  Sonographer:    Alvino Chapel RCS Referring Phys: [phone] COURAGE EMOKPAE IMPRESSIONS  1. Left ventricular ejection fraction, by visual estimation, is 60 to 65%. The left ventricle has normal function. There is mildly increased left ventricular  hypertrophy.  2. Left ventricular diastolic parameters are consistent with Grade I diastolic dysfunction (impaired relaxation).  3. The left ventricle has no regional wall motion abnormalities.  4. Global right ventricle has normal systolic function.The right ventricular size is normal. No increase in right ventricular wall thickness.  5. Left atrial size was normal.  6. Right atrial size was normal.  7. Mild mitral annular calcification.  8. The mitral valve is grossly normal. Trivial mitral valve regurgitation.  9. The tricuspid valve is grossly normal. 10. The tricuspid valve is grossly normal. Tricuspid valve regurgitation is trivial. 11. The aortic valve is tricuspid. Aortic valve regurgitation is not visualized. Mild aortic valve stenosis. 12. The pulmonic valve was not well visualized. Pulmonic valve regurgitation is not visualized. 13. The inferior vena cava is normal in size with greater than 50% respiratory variability, suggesting right atrial pressure of 3 mmHg. FINDINGS  Left Ventricle: Left ventricular ejection fraction, by visual  estimation, is 60 to 65%. The left ventricle has normal function. The left ventricle has no regional wall motion abnormalities. The left ventricular internal cavity size was the left ventricle is normal in size. There is mildly increased left ventricular hypertrophy. Concentric left ventricular hypertrophy. Left ventricular diastolic parameters are consistent with Grade I diastolic dysfunction (impaired relaxation). Indeterminate filling pressures. Right Ventricle: The right ventricular size is normal. No increase in right ventricular wall thickness. Global RV systolic function is has normal systolic function. Left Atrium: Left atrial size was normal in size. Right Atrium: Right atrial size was normal in size Pericardium: There is no evidence of pericardial effusion. Mitral Valve: The mitral valve is grossly normal. Mild mitral annular calcification. Trivial mitral valve  regurgitation. Tricuspid Valve: The tricuspid valve is grossly normal. Tricuspid valve regurgitation is trivial. Aortic Valve: The aortic valve is tricuspid. . There is mild thickening of the aortic valve. Aortic valve regurgitation is not visualized. Mild aortic stenosis is present. Mild aortic valve annular calcification. There is mild thickening of the aortic valve. Aortic valve mean gradient measures 8.0 mmHg. Aortic valve peak gradient measures 16.6 mmHg. Aortic valve area, by VTI measures 1.79 cm. Pulmonic Valve: The pulmonic valve was not well visualized. Pulmonic valve regurgitation is not visualized. Pulmonic regurgitation is not visualized. Aorta: The aortic root is normal in size and structure. Venous: The inferior vena cava is normal in size with greater than 50% respiratory variability, suggesting right atrial pressure of 3 mmHg. IAS/Shunts: No atrial level shunt detected by color flow Doppler.  LEFT VENTRICLE PLAX 2D LVIDd:         4.67 cm  Diastology LVIDs:         2.51 cm  LV e' lateral:   7.83 cm/s LV PW:         1.02 cm  LV E/e' lateral: 12.9 LV IVS:        1.11 cm  LV e' medial:    7.29 cm/s LVOT diam:     2.00 cm  LV E/e' medial:  13.9 LV SV:         78 ml LV SV Index:   37.74 LVOT Area:     3.14 cm  RIGHT VENTRICLE RV S prime:     17.80 cm/s TAPSE (M-mode): 2.3 cm LEFT ATRIUM           Index       RIGHT ATRIUM           Index LA diam:      3.30 cm 1.59 cm/m  RA Area:     20.50 cm LA Vol (A2C): 49.3 ml 23.76 ml/m RA Volume:   56.70 ml  27.33 ml/m LA Vol (A4C): 47.6 ml 22.94 ml/m  AORTIC VALVE AV Area (Vmax):    1.68 cm AV Area (Vmean):   1.85 cm AV Area (VTI):     1.79 cm AV Vmax:           204.00 cm/s AV Vmean:          131.000 cm/s AV VTI:            0.408 m AV Peak Grad:      16.6 mmHg AV Mean Grad:      8.0 mmHg LVOT Vmax:         109.00 cm/s LVOT Vmean:        77.100 cm/s LVOT VTI:          0.232 m LVOT/AV VTI ratio: 0.57  AORTA Ao Root diam: 3.40 cm MITRAL VALVE MV Area (PHT):  2.91 cm              SHUNTS MV PHT:        75.69 msec            Systemic VTI:  0.23 m MV Decel Time: 261 msec              Systemic Diam: 2.00 cm MV E velocity: 101.00 cm/s 103 cm/s MV A velocity: 110.00 cm/s 70.3 cm/s MV E/A ratio:  0.92        1.5  Kate Sable MD Electronically signed by Kate Sable MD Signature Date/Time: 05/10/2019/3:35:48 PM    Final     Anti-infectives: Anti-infectives (From admission, onward)   None      Assessment/Plan: 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  The risks and benefits of both procedures including bleeding, infection, pneumothorax, and dislodgment of the jejunostomy tube were fully explained to the patient, who gave informed consent.  LOS: 2 days    Aviva Signs 05/12/2019

## 2019-05-13 ENCOUNTER — Encounter (HOSPITAL_COMMUNITY)
Admission: EM | Disposition: A | Discharge status: Home Health | Payer: Self-pay | Source: Home / Self Care | Attending: Internal Medicine

## 2019-05-13 ENCOUNTER — Inpatient Hospital Stay (HOSPITAL_COMMUNITY): Payer: Medicare Other

## 2019-05-13 ENCOUNTER — Encounter (HOSPITAL_COMMUNITY): Payer: Self-pay | Admitting: Family Medicine

## 2019-05-13 ENCOUNTER — Inpatient Hospital Stay (HOSPITAL_COMMUNITY): Payer: Medicare Other | Admitting: Anesthesiology

## 2019-05-13 DIAGNOSIS — E43 Unspecified severe protein-calorie malnutrition: Secondary | ICD-10-CM | POA: Insufficient documentation

## 2019-05-13 DIAGNOSIS — C159 Malignant neoplasm of esophagus, unspecified: Secondary | ICD-10-CM

## 2019-05-13 HISTORY — PX: PORTACATH PLACEMENT: SHX2246

## 2019-05-13 HISTORY — PX: JEJUNOSTOMY: SHX313

## 2019-05-13 LAB — CBC
HCT: 40.6 % (ref 39.0–52.0)
Hemoglobin: 13.7 g/dL (ref 13.0–17.0)
MCH: 29.8 pg (ref 26.0–34.0)
MCHC: 33.7 g/dL (ref 30.0–36.0)
MCV: 88.3 fL (ref 80.0–100.0)
Platelets: 114 10*3/uL — ABNORMAL LOW (ref 150–400)
RBC: 4.6 MIL/uL (ref 4.22–5.81)
RDW: 13.9 % (ref 11.5–15.5)
WBC: 4.7 10*3/uL (ref 4.0–10.5)
nRBC: 0 % (ref 0.0–0.2)

## 2019-05-13 LAB — BASIC METABOLIC PANEL
Anion gap: 11 (ref 5–15)
BUN: 12 mg/dL (ref 8–23)
CO2: 25 mmol/L (ref 22–32)
Calcium: 8.4 mg/dL — ABNORMAL LOW (ref 8.9–10.3)
Chloride: 103 mmol/L (ref 98–111)
Creatinine, Ser: 0.65 mg/dL (ref 0.61–1.24)
GFR calc Af Amer: >60 mL/min (ref >60)
GFR calc non Af Amer: >60 mL/min (ref >60)
Glucose, Bld: 156 mg/dL — ABNORMAL HIGH (ref 70–99)
Potassium: 3 mmol/L — ABNORMAL LOW (ref 3.5–5.1)
Sodium: 139 mmol/L (ref 135–145)

## 2019-05-13 LAB — GLUCOSE, CAPILLARY
Glucose-Capillary: 155 mg/dL — ABNORMAL HIGH (ref 70–99)
Glucose-Capillary: 151 mg/dL — ABNORMAL HIGH (ref 70–99)
Glucose-Capillary: 161 mg/dL — ABNORMAL HIGH (ref 70–99)
Glucose-Capillary: 207 mg/dL — ABNORMAL HIGH (ref 70–99)

## 2019-05-13 SURGERY — CREATION, JEJUNOSTOMY
Anesthesia: General | Site: Chest | Laterality: Right

## 2019-05-13 MED ORDER — HYDROMORPHONE HCL 1 MG/ML IJ SOLN
0.5000 mg | INTRAMUSCULAR | Status: DC | PRN
  Administered 2019-05-13 – 2019-05-14 (×3): 0.5 mg via INTRAVENOUS
  Filled 2019-05-13 (×3): qty 0.5

## 2019-05-13 MED ORDER — FENTANYL CITRATE (PF) 100 MCG/2ML IJ SOLN
INTRAMUSCULAR | Status: AC
  Filled 2019-05-13: qty 2

## 2019-05-13 MED ORDER — HYDROMORPHONE HCL 1 MG/ML IJ SOLN
0.2500 mg | INTRAMUSCULAR | Status: DC | PRN
  Administered 2019-05-13: 10:00:00 0.5 mg via INTRAVENOUS
  Filled 2019-05-13: qty 0.5

## 2019-05-13 MED ORDER — OSMOLITE 1.5 CAL PO LIQD
1000.0000 mL | ORAL | Status: DC
  Administered 2019-05-13 – 2019-05-15 (×3): 1000 mL

## 2019-05-13 MED ORDER — HEPARIN SOD (PORK) LOCK FLUSH 100 UNIT/ML IV SOLN
INTRAVENOUS | Status: AC
  Filled 2019-05-13: qty 5

## 2019-05-13 MED ORDER — HEPARIN SOD (PORK) LOCK FLUSH 100 UNIT/ML IV SOLN
INTRAVENOUS | Status: DC | PRN
  Administered 2019-05-13: 500 [IU] via INTRAVENOUS

## 2019-05-13 MED ORDER — LIDOCAINE HCL (PF) 1 % IJ SOLN
INTRAMUSCULAR | Status: AC
  Filled 2019-05-13: qty 30

## 2019-05-13 MED ORDER — ONDANSETRON HCL 4 MG/2ML IJ SOLN
INTRAMUSCULAR | Status: DC | PRN
  Administered 2019-05-13: 4 mg via INTRAVENOUS

## 2019-05-13 MED ORDER — PHENYLEPHRINE HCL (PRESSORS) 10 MG/ML IV SOLN
INTRAVENOUS | Status: DC | PRN
  Administered 2019-05-13: 80 ug via INTRAVENOUS
  Administered 2019-05-13: 40 ug via INTRAVENOUS

## 2019-05-13 MED ORDER — EPHEDRINE SULFATE 50 MG/ML IJ SOLN
INTRAMUSCULAR | Status: DC | PRN
  Administered 2019-05-13 (×2): 10 mg via INTRAVENOUS

## 2019-05-13 MED ORDER — PRO-STAT SUGAR FREE PO LIQD
30.0000 mL | Freq: Every day | ORAL | Status: DC
  Administered 2019-05-13: 12:00:00 30 mL via JEJUNOSTOMY
  Filled 2019-05-13: qty 30

## 2019-05-13 MED ORDER — POVIDONE-IODINE 10 % OINT PACKET
TOPICAL_OINTMENT | CUTANEOUS | Status: DC | PRN
  Administered 2019-05-13: 1 via TOPICAL

## 2019-05-13 MED ORDER — POVIDONE-IODINE 10 % EX OINT
TOPICAL_OINTMENT | CUTANEOUS | Status: AC
  Filled 2019-05-13: qty 2

## 2019-05-13 MED ORDER — LIDOCAINE 2% (20 MG/ML) 5 ML SYRINGE
INTRAMUSCULAR | Status: AC
  Filled 2019-05-13: qty 5

## 2019-05-13 MED ORDER — SODIUM CHLORIDE 0.9 % IR SOLN
Status: DC | PRN
  Administered 2019-05-13: 1000 mL

## 2019-05-13 MED ORDER — HYDROCODONE-ACETAMINOPHEN 7.5-325 MG PO TABS: 1.0000 | ORAL_TABLET | Freq: Once | ORAL | Status: DC | PRN

## 2019-05-13 MED ORDER — FENTANYL CITRATE (PF) 100 MCG/2ML IJ SOLN
INTRAMUSCULAR | Status: DC | PRN
  Administered 2019-05-13 (×4): 50 ug via INTRAVENOUS

## 2019-05-13 MED ORDER — MORPHINE SULFATE (PF) 2 MG/ML IV SOLN: 2.0000 mg | INTRAVENOUS | Status: DC | PRN

## 2019-05-13 MED ORDER — LIDOCAINE HCL (CARDIAC) PF 50 MG/5ML IV SOSY
PREFILLED_SYRINGE | INTRAVENOUS | Status: DC | PRN
  Administered 2019-05-13: 50 mg via INTRAVENOUS

## 2019-05-13 MED ORDER — PROPOFOL 10 MG/ML IV BOLUS
INTRAVENOUS | Status: AC
  Filled 2019-05-13: qty 20

## 2019-05-13 MED ORDER — PRO-STAT SUGAR FREE PO LIQD
30.0000 mL | Freq: Two times a day (BID) | ORAL | Status: DC
  Administered 2019-05-13 – 2019-05-16 (×6): 30 mL via JEJUNOSTOMY
  Filled 2019-05-13 (×5): qty 30

## 2019-05-13 MED ORDER — PHENYLEPHRINE 40 MCG/ML (10ML) SYRINGE FOR IV PUSH (FOR BLOOD PRESSURE SUPPORT)
PREFILLED_SYRINGE | INTRAVENOUS | Status: AC
  Filled 2019-05-13: qty 20

## 2019-05-13 MED ORDER — HEPARIN SODIUM (PORCINE) 5000 UNIT/ML IJ SOLN
5000.0000 [IU] | Freq: Three times a day (TID) | INTRAMUSCULAR | Status: DC
  Administered 2019-05-13 – 2019-05-16 (×7): 5000 [IU] via SUBCUTANEOUS
  Filled 2019-05-13 (×9): qty 1

## 2019-05-13 MED ORDER — PROMETHAZINE HCL 25 MG/ML IJ SOLN: 6.2500 mg | INTRAMUSCULAR | Status: DC | PRN

## 2019-05-13 MED ORDER — BUPIVACAINE LIPOSOME 1.3 % IJ SUSP
INTRAMUSCULAR | Status: DC | PRN
  Administered 2019-05-13: 15 mL

## 2019-05-13 MED ORDER — HEPARIN SODIUM (PORCINE) 5000 UNIT/ML IJ SOLN: 5000.0000 [IU] | Freq: Three times a day (TID) | INTRAMUSCULAR | Status: DC

## 2019-05-13 MED ORDER — POTASSIUM CHLORIDE 10 MEQ/100ML IV SOLN
10.0000 meq | INTRAVENOUS | Status: AC
  Administered 2019-05-13 (×5): 10 meq via INTRAVENOUS
  Filled 2019-05-13 (×10): qty 100

## 2019-05-13 MED ORDER — ONDANSETRON HCL 4 MG/2ML IJ SOLN
INTRAMUSCULAR | Status: AC
  Filled 2019-05-13: qty 2

## 2019-05-13 MED ORDER — BUPIVACAINE LIPOSOME 1.3 % IJ SUSP
INTRAMUSCULAR | Status: AC
  Filled 2019-05-13: qty 20

## 2019-05-13 MED ORDER — SUCCINYLCHOLINE CHLORIDE 200 MG/10ML IV SOSY
PREFILLED_SYRINGE | INTRAVENOUS | Status: AC
  Filled 2019-05-13: qty 10

## 2019-05-13 MED ORDER — LACTATED RINGERS IV SOLN
INTRAVENOUS | Status: DC
  Administered 2019-05-13: 07:00:00 1000 mL via INTRAVENOUS

## 2019-05-13 MED ORDER — SODIUM CHLORIDE (PF) 0.9 % IJ SOLN
INTRAMUSCULAR | Status: DC | PRN
  Administered 2019-05-13: 500 mL

## 2019-05-13 MED ORDER — KETOROLAC TROMETHAMINE 30 MG/ML IJ SOLN
15.0000 mg | Freq: Once | INTRAMUSCULAR | Status: AC
  Administered 2019-05-13: 15 mg via INTRAVENOUS
  Filled 2019-05-13: qty 1

## 2019-05-13 MED ORDER — PROPOFOL 10 MG/ML IV BOLUS
INTRAVENOUS | Status: DC | PRN
  Administered 2019-05-13: 160 mg via INTRAVENOUS

## 2019-05-13 MED ORDER — WATER FOR IRRIGATION, STERILE IR SOLN
Status: DC | PRN
  Administered 2019-05-13: 500 mL via JEJUNOSTOMY

## 2019-05-13 MED ORDER — OSMOLITE 1.2 CAL PO LIQD: 1000.0000 mL | ORAL | Status: DC

## 2019-05-13 MED ORDER — SUCCINYLCHOLINE 20MG/ML (10ML) SYRINGE FOR MEDFUSION PUMP - OPTIME
INTRAMUSCULAR | Status: DC | PRN
  Administered 2019-05-13: 120 mg via INTRAVENOUS

## 2019-05-13 SURGICAL SUPPLY — 55 items
APPLICATOR CHLORAPREP 10.5 ORG (MISCELLANEOUS) ×4 IMPLANT
BAG DECANTER FOR FLEXI CONT (MISCELLANEOUS) ×4 IMPLANT
BLADE SURG SZ11 CARB STEEL (BLADE) ×2 IMPLANT
CELLS DAT CNTRL 66122 CELL SVR (MISCELLANEOUS) ×2 IMPLANT
CHLORAPREP W/TINT 26 (MISCELLANEOUS) ×4 IMPLANT
CLOTH BEACON ORANGE TIMEOUT ST (SAFETY) ×4 IMPLANT
COVER LIGHT HANDLE STERIS (MISCELLANEOUS) ×8 IMPLANT
COVER MAYO STAND XLG (MISCELLANEOUS) ×2 IMPLANT
COVER WAND RF STERILE (DRAPES) ×8 IMPLANT
DECANTER SPIKE VIAL GLASS SM (MISCELLANEOUS) ×4 IMPLANT
DERMABOND ADVANCED (GAUZE/BANDAGES/DRESSINGS) ×2
DERMABOND ADVANCED .7 DNX12 (GAUZE/BANDAGES/DRESSINGS) ×2 IMPLANT
DRAPE C-ARM FOLDED MOBILE STRL (DRAPES) ×4 IMPLANT
DRAPE CHEST BREAST 15X10 FENES (DRAPES) ×2 IMPLANT
DRAPE HALF SHEET 40X57 (DRAPES) ×2 IMPLANT
DRSG OPSITE POSTOP 4X8 (GAUZE/BANDAGES/DRESSINGS) ×4 IMPLANT
ELECT REM PT RETURN 9FT ADLT (ELECTROSURGICAL) ×4
ELECTRODE REM PT RTRN 9FT ADLT (ELECTROSURGICAL) ×2 IMPLANT
GLOVE BIO SURGEON STRL SZ7 (GLOVE) ×8 IMPLANT
GLOVE BIOGEL PI IND STRL 7.0 (GLOVE) ×6 IMPLANT
GLOVE BIOGEL PI INDICATOR 7.0 (GLOVE) ×8
GLOVE SURG SS PI 7.5 STRL IVOR (GLOVE) ×6 IMPLANT
GOWN STRL REUS W/TWL LRG LVL3 (GOWN DISPOSABLE) ×16 IMPLANT
INST SET MAJOR GENERAL (KITS) ×4 IMPLANT
IV NS 500ML (IV SOLUTION) ×2
IV NS 500ML BAXH (IV SOLUTION) ×2 IMPLANT
KIT PORT POWER 8FR ISP MRI (Port) ×4 IMPLANT
KIT TURNOVER KIT A (KITS) ×4 IMPLANT
MANIFOLD NEPTUNE II (INSTRUMENTS) ×4 IMPLANT
NDL HYPO 18GX1.5 BLUNT FILL (NEEDLE) ×2 IMPLANT
NDL HYPO 21X1.5 SAFETY (NEEDLE) ×2 IMPLANT
NDL HYPO 25X1 1.5 SAFETY (NEEDLE) ×2 IMPLANT
NEEDLE HYPO 18GX1.5 BLUNT FILL (NEEDLE) ×4 IMPLANT
NEEDLE HYPO 21X1.5 SAFETY (NEEDLE) ×4 IMPLANT
NEEDLE HYPO 25X1 1.5 SAFETY (NEEDLE) ×4 IMPLANT
NS IRRIG 1000ML POUR BTL (IV SOLUTION) ×8 IMPLANT
PACK ABDOMINAL MAJOR (CUSTOM PROCEDURE TRAY) ×4 IMPLANT
PAD ARMBOARD 7.5X6 YLW CONV (MISCELLANEOUS) ×4 IMPLANT
PENCIL HANDSWITCHING (ELECTRODE) ×2 IMPLANT
RETRACTOR WND ALEXIS 18 MED (MISCELLANEOUS) ×2 IMPLANT
RTRCTR WOUND ALEXIS 18CM MED (MISCELLANEOUS) ×4
SET BASIN LINEN APH (SET/KITS/TRAYS/PACK) ×4 IMPLANT
SPONGE DRAIN TRACH 4X4 STRL 2S (GAUZE/BANDAGES/DRESSINGS) ×2 IMPLANT
STAPLER VISISTAT (STAPLE) ×4 IMPLANT
SUT CHROMIC 4 0 RB 1X27 (SUTURE) ×4 IMPLANT
SUT MNCRL AB 4-0 PS2 18 (SUTURE) ×4 IMPLANT
SUT PDS AB CT VIOLET #0 27IN (SUTURE) ×8 IMPLANT
SUT SILK 3 0 SH CR/8 (SUTURE) ×6 IMPLANT
SUT VIC AB 3-0 SH 27 (SUTURE) ×2
SUT VIC AB 3-0 SH 27X BRD (SUTURE) ×2 IMPLANT
SYR 20ML LL LF (SYRINGE) ×8 IMPLANT
SYR 5ML LL (SYRINGE) ×4 IMPLANT
SYR CONTROL 10ML LL (SYRINGE) ×4 IMPLANT
TUBE J 18FR (TUBING) ×4 IMPLANT
WATER STERILE IRR 500ML POUR (IV SOLUTION) ×4 IMPLANT

## 2019-05-13 NOTE — Plan of Care (Signed)

## 2019-05-13 NOTE — Transfer of Care (Signed)
Immediate Anesthesia Transfer of Care Note  Patient: Kurt Duran  Procedure(s) Performed: JEJUNOSTOMY FEEDING TUBE PLACEMENT (Procedure #2) (N/A Abdomen) INSERTION PORT-A-CATH (attached catheter in right subclavian) (Procedure #1) (Right Chest)  Patient Location: PACU  Anesthesia Type:General  Level of Consciousness: awake  Airway & Oxygen Therapy: Patient Spontanous Breathing  Post-op Assessment: Report given to RN  Post vital signs: Reviewed  Last Vitals:  Vitals Value Taken Time  BP 73/42 05/13/19 0926  Temp    Pulse 76 05/13/19 0928  Resp 18 05/13/19 0928  SpO2 94 % 05/13/19 0928  Vitals shown include unvalidated device data.  Last Pain:  Vitals:   05/13/19 0654  TempSrc: Oral  PainSc: 0-No pain      Patients Stated Pain Goal: 8 ([id number])  Complications: No apparent anesthesia complications

## 2019-05-13 NOTE — Anesthesia Procedure Notes (Signed)
Procedure Name: Intubation Date/Time: 05/13/2019 7:43 AM Performed by: Ollen Bowl, CRNA Pre-anesthesia Checklist: Patient identified, Patient being monitored, Timeout performed, Emergency Drugs available and Suction available Patient Re-evaluated:Patient Re-evaluated prior to induction Oxygen Delivery Method: Circle system utilized Preoxygenation: Pre-oxygenation with 100% oxygen Induction Type: IV induction Ventilation: Mask ventilation without difficulty Laryngoscope Size: Mac and 3 Grade View: Grade I Tube type: Oral Tube size: 7.0 mm Number of attempts: 1 Airway Equipment and Method: Stylet Placement Confirmation: ETT inserted through vocal cords under direct vision,  positive ETCO2 and breath sounds checked- equal and bilateral Secured at: 21 cm Tube secured with: Tape Dental Injury: Teeth and Oropharynx as per pre-operative assessment

## 2019-05-13 NOTE — Interval H&P Note (Signed)
History and Physical Interval Note:  05/13/2019 7:15 AM  Kurt Duran  has presented today for surgery, with the diagnosis of esophageal neoplasm.  The various methods of treatment have been discussed with the patient and family. After consideration of risks, benefits and other options for treatment, the patient has consented to  Procedure(s): JEJUNOSTOMY (N/A) INSERTION PORT-A-CATH (N/A) as a surgical intervention.  The patient's history has been reviewed, patient examined, no change in status, stable for surgery.  I have reviewed the patient's chart and labs.  Questions were answered to the patient's satisfaction.     Aviva Signs

## 2019-05-13 NOTE — Progress Notes (Signed)
Patient Demographics:    Kurt Duran, is a 74 y.o. male, DOB - 1945/08/03, [id number]  Admit date - 05/09/2019   Admitting Physician Courage Denton Brick, MD  Outpatient Primary MD for the patient is Glenda Chroman, MD  LOS - 3   Chief Complaint  Patient presents with  . Emesis      Brief Summary:- 74 y.o. 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 Delton Coombes  input greatly appreciated, plan for PET scan as an outpatient, likely will need chemotherapy, he is arranging for outpatient referral to CT surgery in [facility]. -Status post EGD by  Dr. Oneida Alar -Patient went  for open J-tube procedure/Port-A-Cath earlier this morning/ -Chemo/radiation and then possibly down the road resection/surgery of the esophageal mass  COPD - no acute exacerbation at this time, continue bronchodilators  DM2-    -Given poor oral intake, hold Metformin and glipizide - Use Novolog/Humalog Sliding scale insulin with Accu-Cheks/Fingersticks as ordered   Pression/Anxiety -  stable at this time, okay to use IV lorazepam as needed severe anxiety given diagnosis of possible malignancy  Hypokalemia -Repleted.  Severe protein calorie malnutrition -This is in the context of poor oral intake from esophageal malignancy. -Nutritionist input greatly appreciated, patient will be started on tube feeds via J-tube. -Monitor closely for refeeding syndrome  Disposition -  -Plan is to discharge home with home health, when patient intake is reliable through his J-tube, and he will need close monitoring for next 3 days given he is high risk for refeeding syndrome .  Code Status : Full  Family Communication:   (patient is alert, awake and coherent) Discussed with wife at bedside  Procedures:- -EGD on 05/10/2019 revealed esophageal mass in the lower third, pathology consistent with adenocarcinoma jejunostomy tube placement, portacath placement on 05/13/19 - Procedure: Feeding jejunostomy tube placement, Port-A-Cath placement by Aviva Signs, MD 05/13/2019  Consults  :  Gi/Oncology/gen surgery  DVT Prophylaxis  :  SCD  Lab Results  Component Value Date   PLT 114 (L) 05/13/2019    Inpatient Medications  Scheduled Meds: . ALPRAZolam  0.5 mg Oral BID  . Chlorhexidine Gluconate Cloth  6 each Topical Once  . feeding supplement (PRO-STAT SUGAR FREE 64)  30 mL Per J Tube Daily  . FLUoxetine  20 mg Oral Daily  . heparin  5,000 Units Subcutaneous Q8H  . insulin aspart  0-15 Units Subcutaneous TID WC  . insulin aspart  0-5 Units  Subcutaneous QHS  . mupirocin ointment  1 application Nasal BID  . pantoprazole (PROTONIX) IV  40 mg Intravenous BID  . sodium chloride flush  3 mL Intravenous Q12H  . terazosin  10 mg Oral Daily   Continuous Infusions: . sodium chloride    . dextrose 5 % and 0.45% NaCl 75 mL/hr at 05/13/19 1108  . feeding supplement (OSMOLITE 1.2 CAL)    . potassium chloride 10 mEq (05/13/19 1111)   PRN Meds:.sodium chloride, acetaminophen **OR** acetaminophen,  albuterol, alum & mag hydroxide-simeth, bisacodyl, HYDROmorphone (DILAUDID) injection, labetalol, LORazepam, metoCLOPramide (REGLAN) injection, ondansetron **OR** ondansetron (ZOFRAN) IV, polyethylene glycol, sodium chloride flush   Anti-infectives (From admission, onward)   Start     Dose/Rate Route Frequency Ordered Stop   05/13/19 0600  ceFAZolin (ANCEF) IVPB 2g/100 mL premix  Status:  Discontinued     2 g 200 mL/hr over 30 Minutes Intravenous On call to O.R. 05/12/19 1624 05/13/19 1025        Objective:   Vitals:   05/13/19 0930 05/13/19 0945 05/13/19 1000 05/13/19 1029  BP: (!) 92/56 111/61 100/60 108/61  Pulse: 79 76 76 77  Resp: 14 17 17 16   Temp:    (!) 97.4 F (36.3 C)  TempSrc:    Oral  SpO2: 92% 94% 96% 100%  Weight:      Height:        Wt Readings from Last 3 Encounters:  05/12/19 86.3 kg  04/03/16 102.1 kg     Intake/Output Summary (Last 24 hours) at 05/13/2019 1211 Last data filed at 05/13/2019 0929 Gross per 24 hour  Intake 4022.49 ml  Output 210 ml  Net 3812.49 ml    Physical Exam  Awake Alert, Oriented X 3, No new F.N deficits, Normal affect Symmetrical Chest wall movement, Good air movement bilaterally, CTAB, right upper Port-A-Cath insertion site looks clean. RRR,No Gallops,Rubs or new Murmurs, No Parasternal Heave +ve B.Sounds, Abd Soft, midline surgical wound covered with Ms. Magdalene River, left upper abdomen J-tube. No Cyanosis, Clubbing or edema, No new Rash or bruise      Data Review:   Micro Results Recent Results (from the past 240 hour(s))  Respiratory Panel by RT PCR (Flu A&B, Covid) - Nasopharyngeal Swab     Status: None   Collection Time: 05/09/19 10:37 PM   Specimen: Nasopharyngeal Swab  Result Value Ref Range Status   SARS Coronavirus 2 by RT PCR NEGATIVE NEGATIVE Final    Comment: (NOTE) SARS-CoV-2 target nucleic acids are NOT DETECTED. The SARS-CoV-2 RNA is generally detectable in upper respiratoy specimens during the acute phase of  infection. The lowest concentration of SARS-CoV-2 viral copies this assay can detect is 131 copies/mL. A negative result does not preclude SARS-Cov-2 infection and should not be used as the sole basis for treatment or other patient management decisions. A negative result may occur with  improper specimen collection/handling, submission of specimen other than nasopharyngeal swab, presence of viral mutation(s) within the areas targeted by this assay, and inadequate number of viral copies (<131 copies/mL). A negative result must be combined with clinical observations, patient history, and epidemiological information. The expected result is Negative. Fact Sheet for Patients:  PinkCheek.be Fact Sheet for Healthcare Providers:  GravelBags.it This test is not yet ap proved or cleared by the [place] FDA and  has been authorized for detection and/or diagnosis of SARS-CoV-2 by FDA under an Emergency Use Authorization (EUA). This EUA will remain  in effect (meaning this test can be  used) for the duration of the COVID-19 declaration under Section 564(b)(1) of the Act, 21 U.S.C. section 360bbb-3(b)(1), unless the authorization is terminated or revoked sooner.    Influenza A by PCR NEGATIVE NEGATIVE Final   Influenza B by PCR NEGATIVE NEGATIVE Final    Comment: (NOTE) The Xpert Xpress SARS-CoV-2/FLU/RSV assay is intended as an aid in  the diagnosis of influenza from Nasopharyngeal swab specimens and  should not be used as a sole basis for treatment. Nasal washings and  aspirates are unacceptable for Xpert Xpress SARS-CoV-2/FLU/RSV  testing. Fact Sheet for Patients: PinkCheek.be Fact Sheet for Healthcare Providers: GravelBags.it This test is not yet approved or cleared by the [place] FDA and  has been authorized for detection and/or diagnosis of SARS-CoV-2 by  FDA under  an Emergency Use Authorization (EUA). This EUA will remain  in effect (meaning this test can be used) for the duration of the  Covid-19 declaration under Section 564(b)(1) of the Act, 21  U.S.C. section 360bbb-3(b)(1), unless the authorization is  terminated or revoked. Performed at [facility], [address], [place], [place] [address]   Surgical PCR screen     Status: None   Collection Time: 05/12/19  4:38 PM   Specimen: Nasal Mucosa; Nasal Swab  Result Value Ref Range Status   MRSA, PCR NEGATIVE NEGATIVE Final   Staphylococcus aureus NEGATIVE NEGATIVE Final    Comment: (NOTE) The Xpert SA Assay (FDA approved for NASAL specimens in patients 30 years of age and older), is one component of a comprehensive surveillance program. It is not intended to diagnose infection nor to guide or monitor treatment. Performed at [facility], [address], [place], [place] [address]     Radiology Reports CT Chest W Contrast  Result Date: 05/09/2019 CLINICAL DATA:  Nausea and vomiting. EXAM: CT CHEST, ABDOMEN, AND PELVIS WITH CONTRAST TECHNIQUE: Multidetector CT imaging of the chest, abdomen and pelvis was performed following the standard protocol during bolus administration of intravenous contrast. CONTRAST:  135mL OMNIPAQUE IOHEXOL 300 MG/ML  SOLN COMPARISON:  Abdomen pelvis CT, dated January 14, 2007, is available for comparison. FINDINGS: CT CHEST FINDINGS Cardiovascular: There is moderate severity calcification of the thoracic aorta. Normal heart size. No pericardial effusion. Marked severity coronary artery calcification is seen. Mediastinum/Nodes: No enlarged mediastinal, hilar, or axillary lymph nodes. Small cystic structures are seen within both lobes of the thyroid gland. Dilatation of the esophagus is seen which measures approximately 2.7 cm x 1.8 cm. A small hiatal hernia is noted. Lungs/Pleura: Lungs are clear. No pleural effusion or pneumothorax. Musculoskeletal: Multilevel degenerative  changes seen throughout the thoracic spine. CT ABDOMEN PELVIS FINDINGS Hepatobiliary: No focal liver abnormality is seen. No gallstones, gallbladder wall thickening, or biliary dilatation. Pancreas: Unremarkable. No pancreatic ductal dilatation or surrounding inflammatory changes. Spleen: Normal in size without focal abnormality. Adrenals/Urinary Tract: The right adrenal gland is normal in appearance. 0.6 cm 1.0 cm fat density left adrenal masses are seen. Kidneys are normal, without renal calculi or hydronephrosis. A 5 mm cyst is seen within the posterolateral aspect of the mid right kidney. Bladder is unremarkable. Stomach/Bowel: Stomach is within normal limits. Appendix appears normal. No evidence of bowel wall thickening, distention, or inflammatory changes. Noninflamed diverticula is seen throughout the descending colon. Vascular/Lymphatic: Moderate to marked severity aortic atherosclerosis. No enlarged abdominal or pelvic lymph nodes. Reproductive: Prostate is unremarkable. Other: No abdominal wall hernia or abnormality. No abdominopelvic ascites. Musculoskeletal: A predominant stable 9 mm sclerotic focus is seen within the  posterior aspect of the sacrum on the left. Degenerative changes seen throughout the lumbar spine. IMPRESSION: 1. No evidence of acute or active cardiopulmonary disease. 2. Small hiatal hernia. 3. Small left adrenal myelolipomas. 4. Noninflamed diverticula of the descending colon. Electronically Signed   By: Virgina Norfolk M.D.   On: 05/09/2019 22:07   CT ABDOMEN PELVIS W CONTRAST  Result Date: 05/09/2019 CLINICAL DATA:  Nausea and vomiting. EXAM: CT CHEST, ABDOMEN, AND PELVIS WITH CONTRAST TECHNIQUE: Multidetector CT imaging of the chest, abdomen and pelvis was performed following the standard protocol during bolus administration of intravenous contrast. CONTRAST:  187mL OMNIPAQUE IOHEXOL 300 MG/ML  SOLN COMPARISON:  Abdomen pelvis CT, dated January 14, 2007, is available for  comparison. FINDINGS: CT CHEST FINDINGS Cardiovascular: There is moderate severity calcification of the thoracic aorta. Normal heart size. No pericardial effusion. Marked severity coronary artery calcification is seen. Mediastinum/Nodes: No enlarged mediastinal, hilar, or axillary lymph nodes. Small cystic structures are seen within both lobes of the thyroid gland. Dilatation of the esophagus is seen which measures approximately 2.7 cm x 1.8 cm. A small hiatal hernia is noted. Lungs/Pleura: Lungs are clear. No pleural effusion or pneumothorax. Musculoskeletal: Multilevel degenerative changes seen throughout the thoracic spine. CT ABDOMEN PELVIS FINDINGS Hepatobiliary: No focal liver abnormality is seen. No gallstones, gallbladder wall thickening, or biliary dilatation. Pancreas: Unremarkable. No pancreatic ductal dilatation or surrounding inflammatory changes. Spleen: Normal in size without focal abnormality. Adrenals/Urinary Tract: The right adrenal gland is normal in appearance. 0.6 cm 1.0 cm fat density left adrenal masses are seen. Kidneys are normal, without renal calculi or hydronephrosis. A 5 mm cyst is seen within the posterolateral aspect of the mid right kidney. Bladder is unremarkable. Stomach/Bowel: Stomach is within normal limits. Appendix appears normal. No evidence of bowel wall thickening, distention, or inflammatory changes. Noninflamed diverticula is seen throughout the descending colon. Vascular/Lymphatic: Moderate to marked severity aortic atherosclerosis. No enlarged abdominal or pelvic lymph nodes. Reproductive: Prostate is unremarkable. Other: No abdominal wall hernia or abnormality. No abdominopelvic ascites. Musculoskeletal: A predominant stable 9 mm sclerotic focus is seen within the posterior aspect of the sacrum on the left. Degenerative changes seen throughout the lumbar spine. IMPRESSION: 1. No evidence of acute or active cardiopulmonary disease. 2. Small hiatal hernia. 3. Small left  adrenal myelolipomas. 4. Noninflamed diverticula of the descending colon. Electronically Signed   By: Virgina Norfolk M.D.   On: 05/09/2019 22:07   DG Chest Port 1 View  Result Date: 05/13/2019 CLINICAL DATA:  Status post Port-A-Cath placement today. EXAM: PORTABLE CHEST 1 VIEW COMPARISON:  Single-view of the chest 12/17/2017. FINDINGS: Right subclavian approach Port-A-Cath is in place with the tip in the mid to lower superior vena cava. Tubing is intact. No pneumothorax. Lungs clear. Heart size normal. Atherosclerosis noted. The patient is status post right shoulder surgery. IMPRESSION: Tip of right subclavian approach Port-A-Cath projects in the mid to lower superior vena cava. No pneumothorax. No acute disease. Atherosclerosis. Electronically Signed   By: Inge Rise M.D.   On: 05/13/2019 09:42   DG C-Arm 1-60 Min-No Report  Result Date: 05/13/2019 Fluoroscopy was utilized by the requesting physician.  No radiographic interpretation.   ECHOCARDIOGRAM COMPLETE  Result Date: 05/10/2019   ECHOCARDIOGRAM REPORT   Patient Name:   ADHRITH WIERMAN Date of Exam: 05/10/2019 Medical Rec #:  [id number]       Height:       73.0 in Accession #:    [id number]  Weight:       183.6 lb Date of Birth:  March 11, 1946       BSA:          2.08 m Patient Age:    64 years        BP:           161/84 mmHg Patient Gender: M               HR:           81 bpm. Exam Location:  [facility] Procedure: 2D Echo, Cardiac Doppler and Color Doppler Indications:    Dyspnea 786.09 / R06.00  History:        Patient has no prior history of Echocardiogram examinations.                 COPD; Risk Factors:Hypertension and Diabetes. GERD.  Sonographer:    Alvino Chapel RCS Referring Phys: [phone] COURAGE EMOKPAE IMPRESSIONS  1. Left ventricular ejection fraction, by visual estimation, is 60 to 65%. The left ventricle has normal function. There is mildly increased left ventricular hypertrophy.  2. Left ventricular diastolic parameters are  consistent with Grade I diastolic dysfunction (impaired relaxation).  3. The left ventricle has no regional wall motion abnormalities.  4. Global right ventricle has normal systolic function.The right ventricular size is normal. No increase in right ventricular wall thickness.  5. Left atrial size was normal.  6. Right atrial size was normal.  7. Mild mitral annular calcification.  8. The mitral valve is grossly normal. Trivial mitral valve regurgitation.  9. The tricuspid valve is grossly normal. 10. The tricuspid valve is grossly normal. Tricuspid valve regurgitation is trivial. 11. The aortic valve is tricuspid. Aortic valve regurgitation is not visualized. Mild aortic valve stenosis. 12. The pulmonic valve was not well visualized. Pulmonic valve regurgitation is not visualized. 13. The inferior vena cava is normal in size with greater than 50% respiratory variability, suggesting right atrial pressure of 3 mmHg. FINDINGS  Left Ventricle: Left ventricular ejection fraction, by visual estimation, is 60 to 65%. The left ventricle has normal function. The left ventricle has no regional wall motion abnormalities. The left ventricular internal cavity size was the left ventricle is normal in size. There is mildly increased left ventricular hypertrophy. Concentric left ventricular hypertrophy. Left ventricular diastolic parameters are consistent with Grade I diastolic dysfunction (impaired relaxation). Indeterminate filling pressures. Right Ventricle: The right ventricular size is normal. No increase in right ventricular wall thickness. Global RV systolic function is has normal systolic function. Left Atrium: Left atrial size was normal in size. Right Atrium: Right atrial size was normal in size Pericardium: There is no evidence of pericardial effusion. Mitral Valve: The mitral valve is grossly normal. Mild mitral annular calcification. Trivial mitral valve regurgitation. Tricuspid Valve: The tricuspid valve is grossly  normal. Tricuspid valve regurgitation is trivial. Aortic Valve: The aortic valve is tricuspid. . There is mild thickening of the aortic valve. Aortic valve regurgitation is not visualized. Mild aortic stenosis is present. Mild aortic valve annular calcification. There is mild thickening of the aortic valve. Aortic valve mean gradient measures 8.0 mmHg. Aortic valve peak gradient measures 16.6 mmHg. Aortic valve area, by VTI measures 1.79 cm. Pulmonic Valve: The pulmonic valve was not well visualized. Pulmonic valve regurgitation is not visualized. Pulmonic regurgitation is not visualized. Aorta: The aortic root is normal in size and structure. Venous: The inferior vena cava is normal in size with greater than 50% respiratory variability, suggesting  right atrial pressure of 3 mmHg. IAS/Shunts: No atrial level shunt detected by color flow Doppler.  LEFT VENTRICLE PLAX 2D LVIDd:         4.67 cm  Diastology LVIDs:         2.51 cm  LV e' lateral:   7.83 cm/s LV PW:         1.02 cm  LV E/e' lateral: 12.9 LV IVS:        1.11 cm  LV e' medial:    7.29 cm/s LVOT diam:     2.00 cm  LV E/e' medial:  13.9 LV SV:         78 ml LV SV Index:   37.74 LVOT Area:     3.14 cm  RIGHT VENTRICLE RV S prime:     17.80 cm/s TAPSE (M-mode): 2.3 cm LEFT ATRIUM           Index       RIGHT ATRIUM           Index LA diam:      3.30 cm 1.59 cm/m  RA Area:     20.50 cm LA Vol (A2C): 49.3 ml 23.76 ml/m RA Volume:   56.70 ml  27.33 ml/m LA Vol (A4C): 47.6 ml 22.94 ml/m  AORTIC VALVE AV Area (Vmax):    1.68 cm AV Area (Vmean):   1.85 cm AV Area (VTI):     1.79 cm AV Vmax:           204.00 cm/s AV Vmean:          131.000 cm/s AV VTI:            0.408 m AV Peak Grad:      16.6 mmHg AV Mean Grad:      8.0 mmHg LVOT Vmax:         109.00 cm/s LVOT Vmean:        77.100 cm/s LVOT VTI:          0.232 m LVOT/AV VTI ratio: 0.57  AORTA Ao Root diam: 3.40 cm MITRAL VALVE MV Area (PHT): 2.91 cm              SHUNTS MV PHT:        75.69 msec             Systemic VTI:  0.23 m MV Decel Time: 261 msec              Systemic Diam: 2.00 cm MV E velocity: 101.00 cm/s 103 cm/s MV A velocity: 110.00 cm/s 70.3 cm/s MV E/A ratio:  0.92        1.5  Kate Sable MD Electronically signed by Kate Sable MD Signature Date/Time: 05/10/2019/3:35:48 PM    Final      CBC Recent Labs  Lab 05/09/19 1923 05/10/19 0643 05/13/19 0552  WBC 6.7 5.9 4.7  HGB 15.7 14.0 13.7  HCT 48.7 44.0 40.6  PLT 170 147* 114*  MCV 90.4 90.9 88.3  MCH 29.1 28.9 29.8  MCHC 32.2 31.8 33.7  RDW 14.4 14.5 13.9    Chemistries  Recent Labs  Lab 05/09/19 1923 05/10/19 0643 05/11/19 0820 05/13/19 0552  NA 144 145 142 139  K 4.1 3.9 3.4* 3.0*  CL 103 107 105 103  CO2 26 26 25 25   GLUCOSE 179* 147* 143* 156*  BUN 28* 23 18 12   CREATININE 1.03 0.90 0.84 0.65  CALCIUM 9.6 8.9 8.7* 8.4*  AST 16  --  17  --   ALT 20  --  18  --   ALKPHOS 72  --  68  --   BILITOT 1.0  --  0.9  --    ------------------------------------------------------------------------------------------------------------------ No results for input(s): CHOL, HDL, LDLCALC, TRIG, CHOLHDL, LDLDIRECT in the last 72 hours.  Lab Results  Component Value Date   HGBA1C 7.1 (H) 05/11/2019   ------------------------------------------------------------------------------------------------------------------ No results for input(s): TSH, T4TOTAL, T3FREE, THYROIDAB in the last 72 hours.  Invalid input(s): FREET3 ------------------------------------------------------------------------------------------------------------------ No results for input(s): VITAMINB12, FOLATE, FERRITIN, TIBC, IRON, RETICCTPCT in the last 72 hours.  Coagulation profile No results for input(s): INR, PROTIME in the last 168 hours.  No results for input(s): DDIMER in the last 72 hours.  Cardiac Enzymes No results for input(s): CKMB, TROPONINI, MYOGLOBIN in the last 168 hours.  Invalid input(s):  CK ------------------------------------------------------------------------------------------------------------------ No results found for: BNP   Phillips Climes M.D on 05/13/2019 at 12:11 PM  Go to [email or web address] - for contact info  [facility] - Office  [phone]

## 2019-05-13 NOTE — Progress Notes (Signed)
PT Cancellation Note  Patient Details Name: DEWAN DEENEY MRN: [id number] DOB: [birth year]   Cancelled Treatment:    Reason Eval/Treat Not Completed: Fatigue/lethargy limiting ability to participate;Other (comment) RN stated to hold PT evaluation for today due to patient just returning from numerous procedures today and having busy day.   3:32 PM, 05/13/19 Mearl Latin PT, DPT Physical Therapist at [facility]

## 2019-05-13 NOTE — Op Note (Signed)
Patient:  Kurt Duran  DOB:  February 24, 1946  MRN:  [id number]   Preop Diagnosis: Esophageal carcinoma  Postop Diagnosis: Same  Procedure: Feeding jejunostomy tube placement, Port-A-Cath placement  Surgeon: Aviva Signs, MD  Anes: General endotracheal  Indications: Patient is a 74 year old white male who was recently found to have esophageal carcinoma with near obstruction of the esophagus.  The patient now presents for a feeding jejunostomy tube placement as well as a Port-A-Cath placement.  The risks and benefits of both procedures including bleeding, infection, pneumothorax, bowel injury, and feeding leakage were fully explained to the patient, who gave informed consent.  Procedure note: The patient was placed in the Trendelenburg position after induction of general endotracheal anesthesia.  The right upper chest was prepped and draped using the usual sterile technique with ChloraPrep.  Surgical site confirmation was performed.  An incision was made below the right clavicle.  A subcutaneous pocket was formed.  A needle was advanced into the right subclavian vein using the Seldinger technique without difficulty.  A guidewire was then advanced into the right atrium under fluoroscopic guidance.  An introducer and peel-away sheath were placed over the guidewire.  The catheter was inserted through the peel-away sheath and the peel-away sheath was removed.  The catheter was then attached to the port and the port placed in subcutaneous pocket.  Adequate positioning was confirmed by fluoroscopy.  Good backflow of venous blood was noted on aspiration of the port.  The port was flushed with heparin flush.  The subcutaneous layer was reapproximated using a 3-0 Vicryl interrupted suture.  The skin was closed using a 4-0 Monocryl subcuticular suture.  Dermabond was applied.  Next, the patient was redraped after the abdomen was prepped and draped using the usual sterile technique with ChloraPrep.  Surgical  site confirmation again was performed.  A midline incision was made above the umbilicus.  The peritoneal cavity was entered into without difficulty.  The small bowel was then run from the ligament of Treitz towards the terminal ileum.  Approximately 2 to 3 feet from the ligament of Treitz, an enterotomy was made.  An 67 French MIC jejunal feeding tube was then inserted through the skin in the left upper quadrant of the abdomen and inserted into the jejunum and fed distally.  A 4-0 chromic pursestring suture which had already been placed at the jejunal opening was tied.  An approximately 5 to 6 cm Witzel procedure was performed from the entrance site approximately using 3-0 silk Lembert sutures.  The jejunum was then attached to the peritoneal layer of the abdominal wall using 3-0 silk suture.  The balloon was inflated with 5 cc of normal saline.  The tube was then flushed and freely allowed saline without difficulty.  The bowel was returned into the abdominal cavity in an orderly fashion.  The fascia was reapproximated using an 0 PDS running suture.  The subcutaneous layer was irrigated with normal saline.  The skin was injected with Exparel.  The incision was closed using staples.  Betadine ointment and dry sterile dressing were applied.  All tape and needle counts were correct at the end of the procedure.  The patient was extubated in the operating room and transferred to PACU in stable condition.  Complications: None  EBL: Minimal  Specimen: None

## 2019-05-13 NOTE — Anesthesia Preprocedure Evaluation (Signed)
Anesthesia Evaluation  Patient identified by MRN, date of birth, ID band Patient awake    Reviewed: Allergy & Precautions, NPO status , Patient's Chart, lab work & pertinent test results  Airway Mallampati: I  TM Distance: >3 FB Neck ROM: Full    Dental no notable dental hx. (+) Teeth Intact   Pulmonary COPD,  COPD inhaler, former smoker,    Pulmonary exam normal breath sounds clear to auscultation       Cardiovascular Exercise Tolerance: Good hypertension, Pt. on medications negative cardio ROS Normal cardiovascular examI Rhythm:Regular Rate:Normal     Neuro/Psych Anxiety negative neurological ROS  negative psych ROS   GI/Hepatic Neg liver ROS, GERD  Medicated and Controlled,Newly Dx'd esoph Ca Here for port and feeding Jtube    Endo/Other  negative endocrine ROSdiabetes, Well Controlled, Type 2, Oral Hypoglycemic Agents  Renal/GU negative Renal ROS  negative genitourinary   Musculoskeletal negative musculoskeletal ROS (+)   Abdominal   Peds negative pediatric ROS (+)  Hematology negative hematology ROS (+)   Anesthesia Other Findings   Reproductive/Obstetrics negative OB ROS                             Anesthesia Physical Anesthesia Plan  ASA: III  Anesthesia Plan: General   Post-op Pain Management:    Induction: Intravenous  PONV Risk Score and Plan: 2 and Ondansetron, Dexamethasone and Treatment may vary due to age or medical condition  Airway Management Planned: Oral ETT  Additional Equipment:   Intra-op Plan:   Post-operative Plan: Extubation in OR  Informed Consent: I have reviewed the patients History and Physical, chart, labs and discussed the procedure including the risks, benefits and alternatives for the proposed anesthesia with the patient or authorized representative who has indicated his/her understanding and acceptance.     Dental advisory given  Plan  Discussed with: CRNA  Anesthesia Plan Comments: (Plan Full PPE use Plan GETA D/W PT -WTP with same after Q&A)        Anesthesia Quick Evaluation

## 2019-05-13 NOTE — Progress Notes (Addendum)
Nutrition Follow-up  DOCUMENTATION CODES:   Severe malnutrition in context of chronic illness  INTERVENTION:  Addendum: d/c Osmolite 1.2 orders. RN notified RD via secure chat that [facility] does not stock Osmolite 1.2 formula, approximately 1-2 days before pharmacy would have on-site.   -Osmolite 1.5 at 15 ml/hr, advance as tolerated, 10 ml every 8 hrs to goal rate 60 ml/hr  -Pro-stat 30 ml BID via J-tube  Tube feed regimen at goal rate 60 ml/hr provides 2360 kcal, 120 grams of protein, and 1094 ml free water  Free water flushes per MD   Patient is at risk for refeeding, recommend monitoring magnesium, potassium, and phosphorus daily for at least 3 days, MD to replete as needed.   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  Addressing via TF  GOAL:   Patient will meet greater than or equal to 90% of their needs  Progressing   MONITOR:   Weight trends, Labs, I & O's, TF tolerance  REASON FOR ASSESSMENT:   Consult Enteral/tube feeding initiation and management  ASSESSMENT:  RD working remotely.  74 year old male with past medical history of COPD, DM2, diabetic retinopathy, hypertensive retinopathy, HTN, HLD, GERD, gout, depression and anxiety who presented to ED with vomiting. Patient reports 4 week history of epigastric burning, nausea with intermittent vomiting and placed on oral pantoprazole as outpatient with no significant improvement in symptoms. Patient has had persistent vomiting,  decreased ability to tolerate oral intake, significant wt loss, as well as burning in throat and epigastric region. CT of chest, abdomen, pelvis shows dilation of esophagus  with small hiatal hernia, pt admitted for intractable vomiting possibly secondary to esophagitis with concerns for food impaction.  2/3 - Clinimix 5/15 with electrolytes  Patient is s/p jejunostomy tube and Port-A-Cath placement due to esophageal carcinoma on 2/5  RD consulted for tube feeding assessment and recommendations. Standard formulas are appropriate for small bowel feedings. RD to initiate trickle feeds and advance slowly.   I/Os: +5194 ml since admit    +2472 ml x 24 hrs UOP: 350 ml x 24 hrs  Medications reviewed and include: SS novolog, Protonix,  D5 NaCl KCl 10 mEq Labs: CBGs 155,135 x 24 hrs, K 3.0 (L)  Diet Order:   Diet Order            Diet NPO time specified  Diet effective midnight              EDUCATION NEEDS:   Education needs have been addressed  Skin:  Skin Assessment: Reviewed RN Assessment  Last BM:  Unknown  Height:   Ht Readings from Last 1 Encounters:  05/09/19 6\' 1"  (1.854 m)    Weight:   Wt Readings from Last 1 Encounters:  05/12/19 86.3 kg    Ideal Body Weight:  83.6 kg  BMI:  Body mass index is 25.1 kg/m.  Estimated Nutritional Needs:   Kcal:  2150-2300 (MSJ x 1.3-1.4)  Protein:  108-115  Fluid:  >/= 2 L/day   Lajuan Lines, RD, LDN Clinical Nutrition [facility] Telephone [phone] After Hours/Weekend Pager: [phone]

## 2019-05-14 DIAGNOSIS — E119 Type 2 diabetes mellitus without complications: Secondary | ICD-10-CM

## 2019-05-14 LAB — BASIC METABOLIC PANEL
Anion gap: 9 (ref 5–15)
BUN: 11 mg/dL (ref 8–23)
CO2: 25 mmol/L (ref 22–32)
Calcium: 8.1 mg/dL — ABNORMAL LOW (ref 8.9–10.3)
Chloride: 103 mmol/L (ref 98–111)
Creatinine, Ser: 0.79 mg/dL (ref 0.61–1.24)
GFR calc Af Amer: >60 mL/min (ref >60)
GFR calc non Af Amer: >60 mL/min (ref >60)
Glucose, Bld: 222 mg/dL — ABNORMAL HIGH (ref 70–99)
Potassium: 3.1 mmol/L — ABNORMAL LOW (ref 3.5–5.1)
Sodium: 137 mmol/L (ref 135–145)

## 2019-05-14 LAB — CBC
HCT: 36.9 % — ABNORMAL LOW (ref 39.0–52.0)
Hemoglobin: 12.3 g/dL — ABNORMAL LOW (ref 13.0–17.0)
MCH: 29.6 pg (ref 26.0–34.0)
MCHC: 33.3 g/dL (ref 30.0–36.0)
MCV: 88.7 fL (ref 80.0–100.0)
Platelets: 103 10*3/uL — ABNORMAL LOW (ref 150–400)
RBC: 4.16 MIL/uL — ABNORMAL LOW (ref 4.22–5.81)
RDW: 14.1 % (ref 11.5–15.5)
WBC: 5.7 10*3/uL (ref 4.0–10.5)
nRBC: 0 % (ref 0.0–0.2)

## 2019-05-14 LAB — MAGNESIUM: Magnesium: 1.9 mg/dL (ref 1.7–2.4)

## 2019-05-14 LAB — PHOSPHORUS: Phosphorus: 2.4 mg/dL — ABNORMAL LOW (ref 2.5–4.6)

## 2019-05-14 LAB — GLUCOSE, CAPILLARY
Glucose-Capillary: 173 mg/dL — ABNORMAL HIGH (ref 70–99)
Glucose-Capillary: 203 mg/dL — ABNORMAL HIGH (ref 70–99)
Glucose-Capillary: 206 mg/dL — ABNORMAL HIGH (ref 70–99)
Glucose-Capillary: 208 mg/dL — ABNORMAL HIGH (ref 70–99)
Glucose-Capillary: 218 mg/dL — ABNORMAL HIGH (ref 70–99)
Glucose-Capillary: 224 mg/dL — ABNORMAL HIGH (ref 70–99)
Glucose-Capillary: 237 mg/dL — ABNORMAL HIGH (ref 70–99)

## 2019-05-14 MED ORDER — POTASSIUM CHLORIDE 20 MEQ/15ML (10%) PO SOLN
40.0000 meq | Freq: Two times a day (BID) | ORAL | Status: AC
  Administered 2019-05-14 (×2): 40 meq
  Filled 2019-05-14 (×2): qty 30

## 2019-05-14 MED ORDER — K PHOS MONO-SOD PHOS DI & MONO 155-852-130 MG PO TABS
500.0000 mg | ORAL_TABLET | Freq: Three times a day (TID) | ORAL | Status: AC
  Administered 2019-05-14 – 2019-05-15 (×6): 500 mg via JEJUNOSTOMY
  Filled 2019-05-14 (×6): qty 2

## 2019-05-14 MED ORDER — HYDROMORPHONE HCL 1 MG/ML IJ SOLN
0.5000 mg | INTRAMUSCULAR | Status: DC | PRN
  Administered 2019-05-14: 0.5 mg via INTRAVENOUS
  Filled 2019-05-14: qty 0.5

## 2019-05-14 NOTE — Progress Notes (Signed)
Patient Demographics:    Kurt Duran, is a 74 y.o. male, DOB - Aug 20, 1945, [id number]  Admit date - 05/09/2019   Admitting Physician Courage Denton Brick, MD  Outpatient Primary MD for the patient is Glenda Chroman, MD  LOS - 4   Chief Complaint  Patient presents with  . Emesis      Brief Summary:- 74 y.o. male with medical history significant of COPD, diabetes mellitus type 2, hypertension, hyperlipidemia, gout, depression, anxiety admitted on 05/09/2019 with intractable emesis, EGD on 05/10/2019 revealed esophageal mass in the lower third, pathology consistent with adenocarcinoma.  Status post Port-A-Cath/surgical J-tube 2/5 by Dr. Arnoldo Morale.   Subjective:    Kurt Duran today has no fevers,    No chest pain, reports postop abdominal pain is improving.   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 Delton Coombes  input greatly appreciated, plan for PET scan as an outpatient, likely will need chemotherapy, he is arranging for outpatient referral to CT surgery in [facility]. -Status post EGD by  Dr. Oneida Alar -Patient went  for open J-tube procedure/Port-A-Cath earlier this morning/ -Chemo/radiation and then possibly down the road resection/surgery of the esophageal mass  COPD - no acute exacerbation at this time, continue bronchodilators  DM2-    -Given poor oral intake, hold Metformin and glipizide - Use Novolog/Humalog Sliding scale  insulin with Accu-Cheks/Fingersticks as ordered   Pression/Anxiety - stable at this time, okay to use IV lorazepam as needed severe anxiety given diagnosis of possible malignancy  Hypokalemia/hypophosphatemia -High risk for refeeding syndrome, continue to monitor labs closely, started on supplement today, monitor closely  Severe protein calorie malnutrition -This is in the context of poor oral intake from esophageal malignancy. -Nutritionist input greatly appreciated, patient has been started on tube feeds via J-tube, he is on 35 cc this morning, in crease slowly as tolerated. -Monitor closely for refeeding syndrome, he has low sodium and potassium which has been repleted today  Disposition -  -Plan is to discharge home with home health, when patient intake is reliable through his J-tube, and he will need close monitoring for next 2 days given he is high risk for refeeding syndrome .  As well we need more PT and OT before he stable for discharge.  Code Status : Full  Family Communication:   (patient is alert, awake and coherent) Discussed with wife at bedside  Procedures:- -EGD on 05/10/2019 revealed esophageal mass in the lower third, pathology consistent with adenocarcinoma jejunostomy tube placement, portacath placement on 05/13/19 - Procedure: Feeding jejunostomy tube placement, Port-A-Cath placement by Aviva Signs, MD 05/13/2019  Consults  :  Gi/Oncology/gen surgery  DVT Prophylaxis  :  [place] heparin  Lab Results  Component Value Date   PLT 103 (L) 05/14/2019    Inpatient Medications  Scheduled Meds: . ALPRAZolam  0.5 mg Oral BID  . Chlorhexidine Gluconate Cloth  6 each Topical Once  . feeding supplement (PRO-STAT SUGAR FREE 64)  30 mL Per J Tube BID  . FLUoxetine  20 mg Oral Daily  . heparin  5,000 Units Subcutaneous Q8H  . insulin aspart  0-15 Units Subcutaneous TID WC  . insulin aspart  0-5 Units Subcutaneous QHS  . mupirocin ointment  1 application Nasal BID  .  pantoprazole (PROTONIX) IV  40 mg Intravenous BID  . phosphorus  500 mg Per J Tube TID  . potassium chloride  40 mEq Per Tube BID  . sodium chloride flush  3 mL Intravenous Q12H  . terazosin  10 mg Oral Daily   Continuous Infusions: . sodium chloride    . dextrose 5 % and 0.45% NaCl 50 mL/hr at 05/14/19 0736  . feeding supplement (OSMOLITE 1.5 CAL) 1,000 mL (05/13/19 1355)   PRN Meds:.sodium chloride, acetaminophen **OR** acetaminophen, albuterol, alum & mag hydroxide-simeth, bisacodyl, HYDROmorphone (DILAUDID) injection, labetalol, LORazepam, metoCLOPramide (REGLAN) injection, ondansetron **OR** ondansetron (ZOFRAN) IV, polyethylene glycol, sodium chloride flush   Anti-infectives (From admission, onward)   Start     Dose/Rate Route Frequency Ordered Stop   05/13/19 0600  ceFAZolin (ANCEF) IVPB 2g/100 mL premix  Status:  Discontinued     2 g 200 mL/hr over 30 Minutes Intravenous On call to O.R. 05/12/19 1624 05/13/19 1025        Objective:   Vitals:   05/13/19 1830 05/13/19 2048 05/13/19 2151 05/14/19 0407  BP: (!) 112/57  103/67 108/64  Pulse: 70 97 89 88  Resp: 16 16 16 18   Temp: (!) 97.5 F (36.4 C)  98.3 F (36.8 C) 98.2 F (36.8 C)  TempSrc: Oral  Oral Oral  SpO2: 99% 94% 96% 94%  Weight: 86 kg     Height:        Wt Readings from Last 3 Encounters:  05/13/19 86 kg  04/03/16 102.1 kg     Intake/Output Summary (Last 24 hours) at 05/14/2019 1157 Last data filed at 05/13/2019 1729 Gross per 24 hour  Intake 819.16 ml  Output --  Net 819.16 ml    Physical Exam  Awake Alert, Oriented X 3, No new F.N deficits, Normal affect Symmetrical Chest wall movement, Good air movement bilaterally, CTAB, right upper chest Port-A-Cath site looks clean RRR,No Gallops,Rubs or new Murmurs, No Parasternal Heave +ve B.Sounds, Abd Soft, No tenderness, No rebound - guarding or rigidity.  Left abdomen J-tube, midline surgical wound bandaged. No Cyanosis, Clubbing or edema, No new Rash  or bruise       Data Review:   Micro Results Recent Results (from the past 240 hour(s))  Respiratory Panel by RT PCR (Flu A&B, Covid) - Nasopharyngeal Swab     Status: None   Collection Time: 05/09/19 10:37 PM   Specimen: Nasopharyngeal Swab  Result Value Ref Range Status   SARS Coronavirus 2 by RT PCR NEGATIVE NEGATIVE Final    Comment: (NOTE) SARS-CoV-2 target nucleic acids are NOT DETECTED. The SARS-CoV-2 RNA is generally detectable in upper respiratoy specimens during the acute phase of infection. The lowest concentration of SARS-CoV-2 viral copies this assay can detect is 131 copies/mL. A negative result does not preclude SARS-Cov-2 infection and should not be used as the sole basis for treatment or other patient management decisions. A negative result may occur with  improper specimen collection/handling, submission of specimen other than nasopharyngeal swab, presence of viral mutation(s) within the areas targeted  by this assay, and inadequate number of viral copies (<131 copies/mL). A negative result must be combined with clinical observations, patient history, and epidemiological information. The expected result is Negative. Fact Sheet for Patients:  PinkCheek.be Fact Sheet for Healthcare Providers:  GravelBags.it This test is not yet ap proved or cleared by the [place] FDA and  has been authorized for detection and/or diagnosis of SARS-CoV-2 by FDA under an Emergency Use Authorization (EUA). This EUA will remain  in effect (meaning this test can be used) for the duration of the COVID-19 declaration under Section 564(b)(1) of the Act, 21 U.S.C. section 360bbb-3(b)(1), unless the authorization is terminated or revoked sooner.    Influenza A by PCR NEGATIVE NEGATIVE Final   Influenza B by PCR NEGATIVE NEGATIVE Final    Comment: (NOTE) The Xpert Xpress SARS-CoV-2/FLU/RSV assay is intended as an aid in  the  diagnosis of influenza from Nasopharyngeal swab specimens and  should not be used as a sole basis for treatment. Nasal washings and  aspirates are unacceptable for Xpert Xpress SARS-CoV-2/FLU/RSV  testing. Fact Sheet for Patients: PinkCheek.be Fact Sheet for Healthcare Providers: GravelBags.it This test is not yet approved or cleared by the [place] FDA and  has been authorized for detection and/or diagnosis of SARS-CoV-2 by  FDA under an Emergency Use Authorization (EUA). This EUA will remain  in effect (meaning this test can be used) for the duration of the  Covid-19 declaration under Section 564(b)(1) of the Act, 21  U.S.C. section 360bbb-3(b)(1), unless the authorization is  terminated or revoked. Performed at [facility], [address] [place], [place] [address]   Surgical PCR screen     Status: None   Collection Time: 05/12/19  4:38 PM   Specimen: Nasal Mucosa; Nasal Swab  Result Value Ref Range Status   MRSA, PCR NEGATIVE NEGATIVE Final   Staphylococcus aureus NEGATIVE NEGATIVE Final    Comment: (NOTE) The Xpert SA Assay (FDA approved for NASAL specimens in patients 24 years of age and older), is one component of a comprehensive surveillance program. It is not intended to diagnose infection nor to guide or monitor treatment. Performed at [facility], [address] [place], [place] [address]     Radiology Reports CT Chest W Contrast  Result Date: 05/09/2019 CLINICAL DATA:  Nausea and vomiting. EXAM: CT CHEST, ABDOMEN, AND PELVIS WITH CONTRAST TECHNIQUE: Multidetector CT imaging of the chest, abdomen and pelvis was performed following the standard protocol during bolus administration of intravenous contrast. CONTRAST:  120mL OMNIPAQUE IOHEXOL 300 MG/ML  SOLN COMPARISON:  Abdomen pelvis CT, dated January 14, 2007, is available for comparison. FINDINGS: CT CHEST FINDINGS Cardiovascular: There is moderate severity  calcification of the thoracic aorta. Normal heart size. No pericardial effusion. Marked severity coronary artery calcification is seen. Mediastinum/Nodes: No enlarged mediastinal, hilar, or axillary lymph nodes. Small cystic structures are seen within both lobes of the thyroid gland. Dilatation of the esophagus is seen which measures approximately 2.7 cm x 1.8 cm. A small hiatal hernia is noted. Lungs/Pleura: Lungs are clear. No pleural effusion or pneumothorax. Musculoskeletal: Multilevel degenerative changes seen throughout the thoracic spine. CT ABDOMEN PELVIS FINDINGS Hepatobiliary: No focal liver abnormality is seen. No gallstones, gallbladder wall thickening, or biliary dilatation. Pancreas: Unremarkable. No pancreatic ductal dilatation or surrounding inflammatory changes. Spleen: Normal in size without focal abnormality. Adrenals/Urinary Tract: The right adrenal gland is normal in appearance. 0.6 cm 1.0 cm fat density left adrenal masses are seen. Kidneys are normal, without renal  calculi or hydronephrosis. A 5 mm cyst is seen within the posterolateral aspect of the mid right kidney. Bladder is unremarkable. Stomach/Bowel: Stomach is within normal limits. Appendix appears normal. No evidence of bowel wall thickening, distention, or inflammatory changes. Noninflamed diverticula is seen throughout the descending colon. Vascular/Lymphatic: Moderate to marked severity aortic atherosclerosis. No enlarged abdominal or pelvic lymph nodes. Reproductive: Prostate is unremarkable. Other: No abdominal wall hernia or abnormality. No abdominopelvic ascites. Musculoskeletal: A predominant stable 9 mm sclerotic focus is seen within the posterior aspect of the sacrum on the left. Degenerative changes seen throughout the lumbar spine. IMPRESSION: 1. No evidence of acute or active cardiopulmonary disease. 2. Small hiatal hernia. 3. Small left adrenal myelolipomas. 4. Noninflamed diverticula of the descending colon.  Electronically Signed   By: Virgina [place] M.D.   On: 05/09/2019 22:07   CT ABDOMEN PELVIS W CONTRAST  Result Date: 05/09/2019 CLINICAL DATA:  Nausea and vomiting. EXAM: CT CHEST, ABDOMEN, AND PELVIS WITH CONTRAST TECHNIQUE: Multidetector CT imaging of the chest, abdomen and pelvis was performed following the standard protocol during bolus administration of intravenous contrast. CONTRAST:  132mL OMNIPAQUE IOHEXOL 300 MG/ML  SOLN COMPARISON:  Abdomen pelvis CT, dated January 14, 2007, is available for comparison. FINDINGS: CT CHEST FINDINGS Cardiovascular: There is moderate severity calcification of the thoracic aorta. Normal heart size. No pericardial effusion. Marked severity coronary artery calcification is seen. Mediastinum/Nodes: No enlarged mediastinal, hilar, or axillary lymph nodes. Small cystic structures are seen within both lobes of the thyroid gland. Dilatation of the esophagus is seen which measures approximately 2.7 cm x 1.8 cm. A small hiatal hernia is noted. Lungs/Pleura: Lungs are clear. No pleural effusion or pneumothorax. Musculoskeletal: Multilevel degenerative changes seen throughout the thoracic spine. CT ABDOMEN PELVIS FINDINGS Hepatobiliary: No focal liver abnormality is seen. No gallstones, gallbladder wall thickening, or biliary dilatation. Pancreas: Unremarkable. No pancreatic ductal dilatation or surrounding inflammatory changes. Spleen: Normal in size without focal abnormality. Adrenals/Urinary Tract: The right adrenal gland is normal in appearance. 0.6 cm 1.0 cm fat density left adrenal masses are seen. Kidneys are normal, without renal calculi or hydronephrosis. A 5 mm cyst is seen within the posterolateral aspect of the mid right kidney. Bladder is unremarkable. Stomach/Bowel: Stomach is within normal limits. Appendix appears normal. No evidence of bowel wall thickening, distention, or inflammatory changes. Noninflamed diverticula is seen throughout the descending colon.  Vascular/Lymphatic: Moderate to marked severity aortic atherosclerosis. No enlarged abdominal or pelvic lymph nodes. Reproductive: Prostate is unremarkable. Other: No abdominal wall hernia or abnormality. No abdominopelvic ascites. Musculoskeletal: A predominant stable 9 mm sclerotic focus is seen within the posterior aspect of the sacrum on the left. Degenerative changes seen throughout the lumbar spine. IMPRESSION: 1. No evidence of acute or active cardiopulmonary disease. 2. Small hiatal hernia. 3. Small left adrenal myelolipomas. 4. Noninflamed diverticula of the descending colon. Electronically Signed   By: Virgina [place] M.D.   On: 05/09/2019 22:07   DG Chest Port 1 View  Result Date: 05/13/2019 CLINICAL DATA:  Status post Port-A-Cath placement today. EXAM: PORTABLE CHEST 1 VIEW COMPARISON:  Single-view of the chest 12/17/2017. FINDINGS: Right subclavian approach Port-A-Cath is in place with the tip in the mid to lower superior vena cava. Tubing is intact. No pneumothorax. Lungs clear. Heart size normal. Atherosclerosis noted. The patient is status post right shoulder surgery. IMPRESSION: Tip of right subclavian approach Port-A-Cath projects in the mid to lower superior vena cava. No pneumothorax. No acute disease. Atherosclerosis. Electronically Signed  By: Inge Rise M.D.   On: 05/13/2019 09:42   DG C-Arm 1-60 Min-No Report  Result Date: 05/13/2019 Fluoroscopy was utilized by the requesting physician.  No radiographic interpretation.   ECHOCARDIOGRAM COMPLETE  Result Date: 05/10/2019   ECHOCARDIOGRAM REPORT   Patient Name:   DERELLE SEELMAN Date of Exam: 05/10/2019 Medical Rec #:  [id number]       Height:       73.0 in Accession #:    [id number]      Weight:       183.6 lb Date of Birth:  11/13/1945       BSA:          2.08 m Patient Age:    72 years        BP:           161/84 mmHg Patient Gender: M               HR:           81 bpm. Exam Location:  [facility] Procedure: 2D Echo, Cardiac  Doppler and Color Doppler Indications:    Dyspnea 786.09 / R06.00  History:        Patient has no prior history of Echocardiogram examinations.                 COPD; Risk Factors:Hypertension and Diabetes. GERD.  Sonographer:    Alvino Chapel RCS Referring Phys: [phone] COURAGE EMOKPAE IMPRESSIONS  1. Left ventricular ejection fraction, by visual estimation, is 60 to 65%. The left ventricle has normal function. There is mildly increased left ventricular hypertrophy.  2. Left ventricular diastolic parameters are consistent with Grade I diastolic dysfunction (impaired relaxation).  3. The left ventricle has no regional wall motion abnormalities.  4. Global right ventricle has normal systolic function.The right ventricular size is normal. No increase in right ventricular wall thickness.  5. Left atrial size was normal.  6. Right atrial size was normal.  7. Mild mitral annular calcification.  8. The mitral valve is grossly normal. Trivial mitral valve regurgitation.  9. The tricuspid valve is grossly normal. 10. The tricuspid valve is grossly normal. Tricuspid valve regurgitation is trivial. 11. The aortic valve is tricuspid. Aortic valve regurgitation is not visualized. Mild aortic valve stenosis. 12. The pulmonic valve was not well visualized. Pulmonic valve regurgitation is not visualized. 13. The inferior vena cava is normal in size with greater than 50% respiratory variability, suggesting right atrial pressure of 3 mmHg. FINDINGS  Left Ventricle: Left ventricular ejection fraction, by visual estimation, is 60 to 65%. The left ventricle has normal function. The left ventricle has no regional wall motion abnormalities. The left ventricular internal cavity size was the left ventricle is normal in size. There is mildly increased left ventricular hypertrophy. Concentric left ventricular hypertrophy. Left ventricular diastolic parameters are consistent with Grade I diastolic dysfunction (impaired relaxation).  Indeterminate filling pressures. Right Ventricle: The right ventricular size is normal. No increase in right ventricular wall thickness. Global RV systolic function is has normal systolic function. Left Atrium: Left atrial size was normal in size. Right Atrium: Right atrial size was normal in size Pericardium: There is no evidence of pericardial effusion. Mitral Valve: The mitral valve is grossly normal. Mild mitral annular calcification. Trivial mitral valve regurgitation. Tricuspid Valve: The tricuspid valve is grossly normal. Tricuspid valve regurgitation is trivial. Aortic Valve: The aortic valve is tricuspid. . There is mild thickening of the aortic valve. Aortic valve regurgitation is  not visualized. Mild aortic stenosis is present. Mild aortic valve annular calcification. There is mild thickening of the aortic valve. Aortic valve mean gradient measures 8.0 mmHg. Aortic valve peak gradient measures 16.6 mmHg. Aortic valve area, by VTI measures 1.79 cm. Pulmonic Valve: The pulmonic valve was not well visualized. Pulmonic valve regurgitation is not visualized. Pulmonic regurgitation is not visualized. Aorta: The aortic root is normal in size and structure. Venous: The inferior vena cava is normal in size with greater than 50% respiratory variability, suggesting right atrial pressure of 3 mmHg. IAS/Shunts: No atrial level shunt detected by color flow Doppler.  LEFT VENTRICLE PLAX 2D LVIDd:         4.67 cm  Diastology LVIDs:         2.51 cm  LV e' lateral:   7.83 cm/s LV PW:         1.02 cm  LV E/e' lateral: 12.9 LV IVS:        1.11 cm  LV e' medial:    7.29 cm/s LVOT diam:     2.00 cm  LV E/e' medial:  13.9 LV SV:         78 ml LV SV Index:   37.74 LVOT Area:     3.14 cm  RIGHT VENTRICLE RV S prime:     17.80 cm/s TAPSE (M-mode): 2.3 cm LEFT ATRIUM           Index       RIGHT ATRIUM           Index LA diam:      3.30 cm 1.59 cm/m  RA Area:     20.50 cm LA Vol (A2C): 49.3 ml 23.76 ml/m RA Volume:   56.70 ml   27.33 ml/m LA Vol (A4C): 47.6 ml 22.94 ml/m  AORTIC VALVE AV Area (Vmax):    1.68 cm AV Area (Vmean):   1.85 cm AV Area (VTI):     1.79 cm AV Vmax:           204.00 cm/s AV Vmean:          131.000 cm/s AV VTI:            0.408 m AV Peak Grad:      16.6 mmHg AV Mean Grad:      8.0 mmHg LVOT Vmax:         109.00 cm/s LVOT Vmean:        77.100 cm/s LVOT VTI:          0.232 m LVOT/AV VTI ratio: 0.57  AORTA Ao Root diam: 3.40 cm MITRAL VALVE MV Area (PHT): 2.91 cm              SHUNTS MV PHT:        75.69 msec            Systemic VTI:  0.23 m MV Decel Time: 261 msec              Systemic Diam: 2.00 cm MV E velocity: 101.00 cm/s 103 cm/s MV A velocity: 110.00 cm/s 70.3 cm/s MV E/A ratio:  0.92        1.5  Kate Sable MD Electronically signed by Kate Sable MD Signature Date/Time: 05/10/2019/3:35:48 PM    Final      CBC Recent Labs  Lab 05/09/19 1923 05/10/19 [id number] 05/13/19 0552 05/14/19 0606  WBC 6.7 5.9 4.7 5.7  HGB 15.7 14.0 13.7 12.3*  HCT 48.7 44.0 40.6 36.9*  PLT 170  147* 114* 103*  MCV 90.4 90.9 88.3 88.7  MCH 29.1 28.9 29.8 29.6  MCHC 32.2 31.8 33.7 33.3  RDW 14.4 14.5 13.9 14.1    Chemistries  Recent Labs  Lab 05/09/19 1923 05/10/19 0643 05/11/19 0820 05/13/19 0552 05/14/19 0606  NA 144 145 142 139 137  K 4.1 3.9 3.4* 3.0* 3.1*  CL 103 107 105 103 103  CO2 26 26 25 25 25   GLUCOSE 179* 147* 143* 156* 222*  BUN 28* 23 18 12 11   CREATININE 1.03 0.90 0.84 0.65 0.79  CALCIUM 9.6 8.9 8.7* 8.4* 8.1*  MG  --   --   --   --  1.9  AST 16  --  17  --   --   ALT 20  --  18  --   --   ALKPHOS 72  --  68  --   --   BILITOT 1.0  --  0.9  --   --    ------------------------------------------------------------------------------------------------------------------ No results for input(s): CHOL, HDL, LDLCALC, TRIG, CHOLHDL, LDLDIRECT in the last 72 hours.  Lab Results  Component Value Date   HGBA1C 7.1 (H) 05/11/2019    ------------------------------------------------------------------------------------------------------------------ No results for input(s): TSH, T4TOTAL, T3FREE, THYROIDAB in the last 72 hours.  Invalid input(s): FREET3 ------------------------------------------------------------------------------------------------------------------ No results for input(s): VITAMINB12, FOLATE, FERRITIN, TIBC, IRON, RETICCTPCT in the last 72 hours.  Coagulation profile No results for input(s): INR, PROTIME in the last 168 hours.  No results for input(s): DDIMER in the last 72 hours.  Cardiac Enzymes No results for input(s): CKMB, TROPONINI, MYOGLOBIN in the last 168 hours.  Invalid input(s): CK ------------------------------------------------------------------------------------------------------------------ No results found for: BNP   Phillips Climes M.D on 05/14/2019 at 11:57 AM  Go to [email or web address] - for contact info  [facility] - Office  [phone]

## 2019-05-14 NOTE — Progress Notes (Signed)
1 Day Post-Op  Subjective: Mild incisional pain.  Objective: Vital signs in last 24 hours: Temp:  [97.5 F (36.4 C)-98.3 F (36.8 C)] 98.2 F (36.8 C) (02/06 0407) Pulse Rate:  [70-97] 88 (02/06 0407) Resp:  [16-18] 18 (02/06 0407) BP: (103-112)/(57-67) 108/64 (02/06 0407) SpO2:  [94 %-100 %] 94 % (02/06 0407) Weight:  [86 kg] 86 kg (02/05 1830) Last BM Date: 05/05/19  Intake/Output from previous day: 02/05 0701 - 02/06 0700 In: 2019.2 [I.V.:1537.4; NG/GT:53.5; IV Piggyback:428.3] Out: 10 [Blood:10] Intake/Output this shift: No intake/output data recorded.  General appearance: alert, cooperative and no distress GI: Soft, incision healing well.  J-tube in place.  Abdomen not distended.  Lab Results:  Recent Labs    05/13/19 0552 05/14/19 0606  WBC 4.7 5.7  HGB 13.7 12.3*  HCT 40.6 36.9*  PLT 114* 103*   BMET Recent Labs    05/13/19 0552 05/14/19 0606  NA 139 137  K 3.0* 3.1*  CL 103 103  CO2 25 25  GLUCOSE 156* 222*  BUN 12 11  CREATININE 0.65 0.79  CALCIUM 8.4* 8.1*   PT/INR No results for input(s): LABPROT, INR in the last 72 hours.  Studies/Results: DG Chest Port 1 View  Result Date: 05/13/2019 CLINICAL DATA:  Status post Port-A-Cath placement today. EXAM: PORTABLE CHEST 1 VIEW COMPARISON:  Single-view of the chest 12/17/2017. FINDINGS: Right subclavian approach Port-A-Cath is in place with the tip in the mid to lower superior vena cava. Tubing is intact. No pneumothorax. Lungs clear. Heart size normal. Atherosclerosis noted. The patient is status post right shoulder surgery. IMPRESSION: Tip of right subclavian approach Port-A-Cath projects in the mid to lower superior vena cava. No pneumothorax. No acute disease. Atherosclerosis. Electronically Signed   By: Inge Rise M.D.   On: 05/13/2019 09:42   DG C-Arm 1-60 Min-No Report  Result Date: 05/13/2019 Fluoroscopy was utilized by the requesting physician.  No radiographic interpretation.     Anti-infectives: Anti-infectives (From admission, onward)   Start     Dose/Rate Route Frequency Ordered Stop   05/13/19 0600  ceFAZolin (ANCEF) IVPB 2g/100 mL premix  Status:  Discontinued     2 g 200 mL/hr over 30 Minutes Intravenous On call to O.R. 05/12/19 1624 05/13/19 1025      Assessment/Plan: s/p Procedure(s): JEJUNOSTOMY FEEDING TUBE PLACEMENT (Procedure #2) INSERTION PORT-A-CATH (attached catheter in right subclavian) (Procedure #1) Impression: Stable postoperatively.  Tolerating tube feeds well.  May use Port-A-Cath if needed.  LOS: 4 days    Aviva Signs 05/14/2019

## 2019-05-15 LAB — CBC
HCT: 35.5 % — ABNORMAL LOW (ref 39.0–52.0)
Hemoglobin: 11.8 g/dL — ABNORMAL LOW (ref 13.0–17.0)
MCH: 29.6 pg (ref 26.0–34.0)
MCHC: 33.2 g/dL (ref 30.0–36.0)
MCV: 89.2 fL (ref 80.0–100.0)
Platelets: 110 10*3/uL — ABNORMAL LOW (ref 150–400)
RBC: 3.98 MIL/uL — ABNORMAL LOW (ref 4.22–5.81)
RDW: 14.3 % (ref 11.5–15.5)
WBC: 5.8 10*3/uL (ref 4.0–10.5)
nRBC: 0 % (ref 0.0–0.2)

## 2019-05-15 LAB — PHOSPHORUS: Phosphorus: 1.7 mg/dL — ABNORMAL LOW (ref 2.5–4.6)

## 2019-05-15 LAB — BASIC METABOLIC PANEL
Anion gap: 11 (ref 5–15)
BUN: 11 mg/dL (ref 8–23)
CO2: 26 mmol/L (ref 22–32)
Calcium: 8.1 mg/dL — ABNORMAL LOW (ref 8.9–10.3)
Chloride: 102 mmol/L (ref 98–111)
Creatinine, Ser: 0.75 mg/dL (ref 0.61–1.24)
GFR calc Af Amer: >60 mL/min (ref >60)
GFR calc non Af Amer: >60 mL/min (ref >60)
Glucose, Bld: 283 mg/dL — ABNORMAL HIGH (ref 70–99)
Potassium: 3.5 mmol/L (ref 3.5–5.1)
Sodium: 139 mmol/L (ref 135–145)

## 2019-05-15 LAB — GLUCOSE, CAPILLARY
Glucose-Capillary: 189 mg/dL — ABNORMAL HIGH (ref 70–99)
Glucose-Capillary: 197 mg/dL — ABNORMAL HIGH (ref 70–99)
Glucose-Capillary: 207 mg/dL — ABNORMAL HIGH (ref 70–99)
Glucose-Capillary: 219 mg/dL — ABNORMAL HIGH (ref 70–99)
Glucose-Capillary: 242 mg/dL — ABNORMAL HIGH (ref 70–99)
Glucose-Capillary: 247 mg/dL — ABNORMAL HIGH (ref 70–99)
Glucose-Capillary: 258 mg/dL — ABNORMAL HIGH (ref 70–99)

## 2019-05-15 LAB — MAGNESIUM: Magnesium: 2 mg/dL (ref 1.7–2.4)

## 2019-05-15 MED ORDER — POTASSIUM PHOSPHATES 15 MMOLE/5ML IV SOLN
30.0000 mmol | Freq: Once | INTRAVENOUS | Status: AC
  Administered 2019-05-15: 11:00:00 30 mmol via INTRAVENOUS
  Filled 2019-05-15: qty 10

## 2019-05-15 MED ORDER — GLIPIZIDE ER 5 MG PO TB24
10.0000 mg | ORAL_TABLET | Freq: Every day | ORAL | Status: DC
  Filled 2019-05-15: qty 2

## 2019-05-15 MED ORDER — GLIPIZIDE 5 MG PO TABS
10.0000 mg | ORAL_TABLET | Freq: Every day | ORAL | Status: DC
  Administered 2019-05-15 – 2019-05-16 (×2): 10 mg
  Filled 2019-05-15 (×2): qty 2

## 2019-05-15 NOTE — Progress Notes (Signed)
Taught patient's wife on how to administer medications through the J-tube, use the kangaroo pump for tube feeding and flush tube feeding.  Patient's wife very interested to learn, but very anxious. Needs reinforcement teaching.

## 2019-05-15 NOTE — Progress Notes (Signed)
2 Days Post-Op  Subjective: Patient has no complaints.  Objective: Vital signs in last 24 hours: Temp:  [98.5 F (36.9 C)-98.8 F (37.1 C)] 98.5 F (36.9 C) (02/07 0444) Pulse Rate:  [80-89] 80 (02/07 0444) Resp:  [16-18] 16 (02/07 0444) BP: (125-154)/(64-78) 154/78 (02/07 0444) SpO2:  [96 %-98 %] 96 % (02/07 1010) Weight:  [87.4 kg] 87.4 kg (02/07 0444) Last BM Date: 05/05/19  Intake/Output from previous day: 02/06 0701 - 02/07 0700 In: 3 [I.V.:3] Out: -  Intake/Output this shift: No intake/output data recorded.  General appearance: alert, cooperative and no distress GI: Soft, incision healing well.  Jejunostomy tube in place.  Bowel sounds active.  Nondistended.  No rigidity is noted.  Lab Results:  Recent Labs    05/14/19 0606 05/15/19 0612  WBC 5.7 5.8  HGB 12.3* 11.8*  HCT 36.9* 35.5*  PLT 103* 110*   BMET Recent Labs    05/14/19 0606 05/15/19 0612  NA 137 139  K 3.1* 3.5  CL 103 102  CO2 25 26  GLUCOSE 222* 283*  BUN 11 11  CREATININE 0.79 0.75  CALCIUM 8.1* 8.1*   PT/INR No results for input(s): LABPROT, INR in the last 72 hours.  Studies/Results: No results found.  Anti-infectives: Anti-infectives (From admission, onward)   Start     Dose/Rate Route Frequency Ordered Stop   05/13/19 0600  ceFAZolin (ANCEF) IVPB 2g/100 mL premix  Status:  Discontinued     2 g 200 mL/hr over 30 Minutes Intravenous On call to O.R. 05/12/19 1624 05/13/19 1025      Assessment/Plan: s/p Procedure(s): JEJUNOSTOMY FEEDING TUBE PLACEMENT (Procedure #2) INSERTION PORT-A-CATH (attached catheter in right subclavian) (Procedure #1) Impression: Patient tolerating jejunostomy tube feeds well.  Rate is now 60 cc an hour.  Awaiting home health arrangements for a jejunostomy tube feeding pump.  Will see patient again in 2 weeks to remove staples.  LOS: 5 days    Aviva Signs 05/15/2019

## 2019-05-15 NOTE — Progress Notes (Signed)
Patient Demographics:    Kurt Duran, is a 74 y.o. male, DOB - July 05, 1945, [id number]  Admit date - 05/09/2019   Admitting Physician Courage Denton Brick, MD  Outpatient Primary MD for the patient is Glenda Chroman, MD  LOS - 5   Chief Complaint  Patient presents with  . Emesis      Brief Summary:- 74 y.o. male with medical history significant of COPD, diabetes mellitus type 2, hypertension, hyperlipidemia, gout, depression, anxiety admitted on 05/09/2019 with intractable emesis, EGD on 05/10/2019 revealed esophageal mass in the lower third, pathology consistent with adenocarcinoma.  Status post Port-A-Cath/surgical J-tube 2/5 by Dr. Arnoldo Morale.   Subjective:    Kurt Duran today has no fevers,    No chest pain, reports postop abdominal pain is improving.   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 Delton Coombes  input greatly appreciated, plan for PET scan as an outpatient, likely will need chemotherapy, he is arranging for outpatient referral to CT surgery in [facility]. -Status post EGD by  Dr. Oneida Alar -Patient went  for open J-tube procedure/Port-A-Cath earlier this morning/ -Chemo/radiation and then possibly down the road resection/surgery of the esophageal mass  COPD - no acute exacerbation at this time, continue bronchodilators  DM2-    -Given poor oral intake, hold Metformin and glipizide, no CBG started to increase after his  tube feed has been started will start back on glipizide. - Use Novolog/Humalog Sliding scale insulin with Accu-Cheks/Fingersticks as ordered   Pression/Anxiety - stable at this time, okay to use IV lorazepam as needed severe anxiety given diagnosis of possible malignancy  Hypokalemia/hypophosphatemia -High risk for refeeding syndrome, spite oral supplementation his phosphorus significantly dropped today, so we will proceed with 30 mmol of potassium phosphate supplement, recheck in a.m. Marland Kitchen  Severe protein calorie malnutrition -This is in the context of poor oral intake from esophageal malignancy. -Nutritionist input greatly appreciated, patient has been started on tube feeds via J-tube, he is on 35 cc this morning, in crease slowly as tolerated. -Monitor closely for refeeding syndrome, he has low sodium and potassium which has been repleted today  Disposition -  -Plan is to discharge home with home health tomorrow once home health is arranged .  And his electrolytes look appropriate   Code Status : Full  Family Communication:   (patient is alert, awake and coherent) Discussed with wife at bedside  Procedures:- -EGD on 05/10/2019 revealed esophageal mass in the lower third, pathology consistent with adenocarcinoma jejunostomy tube placement, portacath placement on 05/13/19 - Procedure: Feeding jejunostomy tube placement, Port-A-Cath placement by Aviva Signs, MD 05/13/2019  Consults  :  Gi/Oncology/gen surgery  DVT Prophylaxis  :  Steele heparin  Lab Results  Component Value Date   PLT 110 (L) 05/15/2019    Inpatient Medications  Scheduled Meds: .  ALPRAZolam  0.5 mg Oral BID  . Chlorhexidine Gluconate Cloth  6 each Topical Once  . feeding supplement (PRO-STAT SUGAR FREE 64)  30 mL Per J Tube BID  . FLUoxetine  20 mg Oral Daily  . glipiZIDE  10 mg Per Tube QAC breakfast  . heparin  5,000 Units Subcutaneous Q8H  . insulin aspart  0-15 Units Subcutaneous TID WC  . insulin aspart  0-5  Units Subcutaneous QHS  . mupirocin ointment  1 application Nasal BID  . pantoprazole (PROTONIX) IV  40 mg Intravenous BID  . phosphorus  500 mg Per J Tube TID  . sodium chloride flush  3 mL Intravenous Q12H  . terazosin  10 mg Oral Daily   Continuous Infusions: . sodium chloride    . dextrose 5 % and 0.45% NaCl 50 mL/hr at 05/14/19 2334  . feeding supplement (OSMOLITE 1.5 CAL) 1,000 mL (05/14/19 2335)  . potassium PHOSPHATE IVPB (in mmol) 30 mmol (05/15/19 1058)   PRN Meds:.sodium chloride, acetaminophen **OR** acetaminophen, albuterol, alum & mag hydroxide-simeth, bisacodyl, HYDROmorphone (DILAUDID) injection, labetalol, LORazepam, metoCLOPramide (REGLAN) injection, ondansetron **OR** ondansetron (ZOFRAN) IV, polyethylene glycol, sodium chloride flush   Anti-infectives (From admission, onward)   Start     Dose/Rate Route Frequency Ordered Stop   05/13/19 0600  ceFAZolin (ANCEF) IVPB 2g/100 mL premix  Status:  Discontinued     2 g 200 mL/hr over 30 Minutes Intravenous On call to O.R. 05/12/19 1624 05/13/19 1025        Objective:   Vitals:   05/14/19 1927 05/14/19 2029 05/15/19 0444 05/15/19 1010  BP:  134/72 (!) 154/78   Pulse: 89 87 80   Resp: 16 18 16    Temp:  98.8 F (37.1 C) 98.5 F (36.9 C)   TempSrc:  Oral Oral   SpO2: 98% 98% 98% 96%  Weight:   87.4 kg   Height:        Wt Readings from Last 3 Encounters:  05/15/19 87.4 kg  04/03/16 102.1 kg     Intake/Output Summary (Last 24 hours) at 05/15/2019 1341 Last data filed at 05/14/2019 2128 Gross per 24 hour  Intake 3 ml  Output --  Net 3 ml    Physical Exam  Awake Alert, Oriented X 3, No new F.N deficits, Normal affect Symmetrical Chest wall movement, Good air movement bilaterally, CTAB, right upper site Port-A-Cath site looks clean RRR,No Gallops,Rubs or new Murmurs, No Parasternal Heave +ve B.Sounds, Abd Soft, No tenderness, No rebound - guarding or rigidity.  PEG site looks clean, midline surgical scar  covered with honeycomb mesh No Cyanosis, Clubbing or edema, No new Rash or bruise        Data Review:   Micro Results Recent Results (from the past 240 hour(s))  Respiratory Panel by RT PCR (Flu A&B, Covid) - Nasopharyngeal Swab     Status: None   Collection Time: 05/09/19 10:37 PM   Specimen: Nasopharyngeal Swab  Result Value Ref Range Status   SARS Coronavirus 2 by RT PCR NEGATIVE NEGATIVE Final    Comment: (NOTE) SARS-CoV-2 target nucleic acids are NOT DETECTED. The SARS-CoV-2 RNA is generally detectable in upper respiratoy specimens during the acute phase of infection. The lowest concentration of SARS-CoV-2 viral copies this assay can detect is 131 copies/mL. A negative result does not preclude SARS-Cov-2 infection and should not be used as the sole basis for treatment or other patient management decisions. A negative result may occur with  improper specimen collection/handling, submission  of specimen other than nasopharyngeal swab, presence of viral mutation(s) within the areas targeted by this assay, and inadequate number of viral copies (<131 copies/mL). A negative result must be combined with clinical observations, patient history, and epidemiological information. The expected result is Negative. Fact Sheet for Patients:  PinkCheek.be Fact Sheet for Healthcare Providers:  GravelBags.it This test is not yet ap proved or cleared by the [place] FDA and  has been authorized for detection and/or diagnosis of SARS-CoV-2 by FDA under an Emergency Use Authorization (EUA). This EUA will remain  in effect (meaning this test can be used) for the duration of the COVID-19 declaration under Section 564(b)(1) of the Act, 21 U.S.C. section 360bbb-3(b)(1), unless the authorization is terminated or revoked sooner.    Influenza A by PCR NEGATIVE NEGATIVE Final   Influenza B by PCR NEGATIVE NEGATIVE Final    Comment:  (NOTE) The Xpert Xpress SARS-CoV-2/FLU/RSV assay is intended as an aid in  the diagnosis of influenza from Nasopharyngeal swab specimens and  should not be used as a sole basis for treatment. Nasal washings and  aspirates are unacceptable for Xpert Xpress SARS-CoV-2/FLU/RSV  testing. Fact Sheet for Patients: PinkCheek.be Fact Sheet for Healthcare Providers: GravelBags.it This test is not yet approved or cleared by the [place] FDA and  has been authorized for detection and/or diagnosis of SARS-CoV-2 by  FDA under an Emergency Use Authorization (EUA). This EUA will remain  in effect (meaning this test can be used) for the duration of the  Covid-19 declaration under Section 564(b)(1) of the Act, 21  U.S.C. section 360bbb-3(b)(1), unless the authorization is  terminated or revoked. Performed at [facility], [address] [place], [place] [address]   Surgical PCR screen     Status: None   Collection Time: 05/12/19  4:38 PM   Specimen: Nasal Mucosa; Nasal Swab  Result Value Ref Range Status   MRSA, PCR NEGATIVE NEGATIVE Final   Staphylococcus aureus NEGATIVE NEGATIVE Final    Comment: (NOTE) The Xpert SA Assay (FDA approved for NASAL specimens in patients 16 years of age and older), is one component of a comprehensive surveillance program. It is not intended to diagnose infection nor to guide or monitor treatment. Performed at [facility], [address], [place], [place] [address]     Radiology Reports CT Chest W Contrast  Result Date: 05/09/2019 CLINICAL DATA:  Nausea and vomiting. EXAM: CT CHEST, ABDOMEN, AND PELVIS WITH CONTRAST TECHNIQUE: Multidetector CT imaging of the chest, abdomen and pelvis was performed following the standard protocol during bolus administration of intravenous contrast. CONTRAST:  184mL OMNIPAQUE IOHEXOL 300 MG/ML  SOLN COMPARISON:  Abdomen pelvis CT, dated January 14, 2007, is available for  comparison. FINDINGS: CT CHEST FINDINGS Cardiovascular: There is moderate severity calcification of the thoracic aorta. Normal heart size. No pericardial effusion. Marked severity coronary artery calcification is seen. Mediastinum/Nodes: No enlarged mediastinal, hilar, or axillary lymph nodes. Small cystic structures are seen within both lobes of the thyroid gland. Dilatation of the esophagus is seen which measures approximately 2.7 cm x 1.8 cm. A small hiatal hernia is noted. Lungs/Pleura: Lungs are clear. No pleural effusion or pneumothorax. Musculoskeletal: Multilevel degenerative changes seen throughout the thoracic spine. CT ABDOMEN PELVIS FINDINGS Hepatobiliary: No focal liver abnormality is seen. No gallstones, gallbladder wall thickening, or biliary dilatation. Pancreas: Unremarkable. No pancreatic ductal dilatation or surrounding inflammatory changes. Spleen: Normal in size without focal abnormality. Adrenals/Urinary Tract: The right adrenal gland is normal in appearance. 0.6 cm  1.0 cm fat density left adrenal masses are seen. Kidneys are normal, without renal calculi or hydronephrosis. A 5 mm cyst is seen within the posterolateral aspect of the mid right kidney. Bladder is unremarkable. Stomach/Bowel: Stomach is within normal limits. Appendix appears normal. No evidence of bowel wall thickening, distention, or inflammatory changes. Noninflamed diverticula is seen throughout the descending colon. Vascular/Lymphatic: Moderate to marked severity aortic atherosclerosis. No enlarged abdominal or pelvic lymph nodes. Reproductive: Prostate is unremarkable. Other: No abdominal wall hernia or abnormality. No abdominopelvic ascites. Musculoskeletal: A predominant stable 9 mm sclerotic focus is seen within the posterior aspect of the sacrum on the left. Degenerative changes seen throughout the lumbar spine. IMPRESSION: 1. No evidence of acute or active cardiopulmonary disease. 2. Small hiatal hernia. 3. Small left  adrenal myelolipomas. 4. Noninflamed diverticula of the descending colon. Electronically Signed   By: Virgina Norfolk M.D.   On: 05/09/2019 22:07   CT ABDOMEN PELVIS W CONTRAST  Result Date: 05/09/2019 CLINICAL DATA:  Nausea and vomiting. EXAM: CT CHEST, ABDOMEN, AND PELVIS WITH CONTRAST TECHNIQUE: Multidetector CT imaging of the chest, abdomen and pelvis was performed following the standard protocol during bolus administration of intravenous contrast. CONTRAST:  134mL OMNIPAQUE IOHEXOL 300 MG/ML  SOLN COMPARISON:  Abdomen pelvis CT, dated January 14, 2007, is available for comparison. FINDINGS: CT CHEST FINDINGS Cardiovascular: There is moderate severity calcification of the thoracic aorta. Normal heart size. No pericardial effusion. Marked severity coronary artery calcification is seen. Mediastinum/Nodes: No enlarged mediastinal, hilar, or axillary lymph nodes. Small cystic structures are seen within both lobes of the thyroid gland. Dilatation of the esophagus is seen which measures approximately 2.7 cm x 1.8 cm. A small hiatal hernia is noted. Lungs/Pleura: Lungs are clear. No pleural effusion or pneumothorax. Musculoskeletal: Multilevel degenerative changes seen throughout the thoracic spine. CT ABDOMEN PELVIS FINDINGS Hepatobiliary: No focal liver abnormality is seen. No gallstones, gallbladder wall thickening, or biliary dilatation. Pancreas: Unremarkable. No pancreatic ductal dilatation or surrounding inflammatory changes. Spleen: Normal in size without focal abnormality. Adrenals/Urinary Tract: The right adrenal gland is normal in appearance. 0.6 cm 1.0 cm fat density left adrenal masses are seen. Kidneys are normal, without renal calculi or hydronephrosis. A 5 mm cyst is seen within the posterolateral aspect of the mid right kidney. Bladder is unremarkable. Stomach/Bowel: Stomach is within normal limits. Appendix appears normal. No evidence of bowel wall thickening, distention, or inflammatory changes.  Noninflamed diverticula is seen throughout the descending colon. Vascular/Lymphatic: Moderate to marked severity aortic atherosclerosis. No enlarged abdominal or pelvic lymph nodes. Reproductive: Prostate is unremarkable. Other: No abdominal wall hernia or abnormality. No abdominopelvic ascites. Musculoskeletal: A predominant stable 9 mm sclerotic focus is seen within the posterior aspect of the sacrum on the left. Degenerative changes seen throughout the lumbar spine. IMPRESSION: 1. No evidence of acute or active cardiopulmonary disease. 2. Small hiatal hernia. 3. Small left adrenal myelolipomas. 4. Noninflamed diverticula of the descending colon. Electronically Signed   By: Virgina Norfolk M.D.   On: 05/09/2019 22:07   DG Chest Port 1 View  Result Date: 05/13/2019 CLINICAL DATA:  Status post Port-A-Cath placement today. EXAM: PORTABLE CHEST 1 VIEW COMPARISON:  Single-view of the chest 12/17/2017. FINDINGS: Right subclavian approach Port-A-Cath is in place with the tip in the mid to lower superior vena cava. Tubing is intact. No pneumothorax. Lungs clear. Heart size normal. Atherosclerosis noted. The patient is status post right shoulder surgery. IMPRESSION: Tip of right subclavian approach Port-A-Cath projects in the mid  to lower superior vena cava. No pneumothorax. No acute disease. Atherosclerosis. Electronically Signed   By: Inge Rise M.D.   On: 05/13/2019 09:42   DG C-Arm 1-60 Min-No Report  Result Date: 05/13/2019 Fluoroscopy was utilized by the requesting physician.  No radiographic interpretation.   ECHOCARDIOGRAM COMPLETE  Result Date: 05/10/2019   ECHOCARDIOGRAM REPORT   Patient Name:   ERAY COLLINSON Date of Exam: 05/10/2019 Medical Rec #:  [id number]       Height:       73.0 in Accession #:    [id number]      Weight:       183.6 lb Date of Birth:  July 13, 1945       BSA:          2.08 m Patient Age:    37 years        BP:           161/84 mmHg Patient Gender: M               HR:            81 bpm. Exam Location:  [facility] Procedure: 2D Echo, Cardiac Doppler and Color Doppler Indications:    Dyspnea 786.09 / R06.00  History:        Patient has no prior history of Echocardiogram examinations.                 COPD; Risk Factors:Hypertension and Diabetes. GERD.  Sonographer:    Alvino Chapel RCS Referring Phys: [id number] COURAGE EMOKPAE IMPRESSIONS  1. Left ventricular ejection fraction, by visual estimation, is 60 to 65%. The left ventricle has normal function. There is mildly increased left ventricular hypertrophy.  2. Left ventricular diastolic parameters are consistent with Grade I diastolic dysfunction (impaired relaxation).  3. The left ventricle has no regional wall motion abnormalities.  4. Global right ventricle has normal systolic function.The right ventricular size is normal. No increase in right ventricular wall thickness.  5. Left atrial size was normal.  6. Right atrial size was normal.  7. Mild mitral annular calcification.  8. The mitral valve is grossly normal. Trivial mitral valve regurgitation.  9. The tricuspid valve is grossly normal. 10. The tricuspid valve is grossly normal. Tricuspid valve regurgitation is trivial. 11. The aortic valve is tricuspid. Aortic valve regurgitation is not visualized. Mild aortic valve stenosis. 12. The pulmonic valve was not well visualized. Pulmonic valve regurgitation is not visualized. 13. The inferior vena cava is normal in size with greater than 50% respiratory variability, suggesting right atrial pressure of 3 mmHg. FINDINGS  Left Ventricle: Left ventricular ejection fraction, by visual estimation, is 60 to 65%. The left ventricle has normal function. The left ventricle has no regional wall motion abnormalities. The left ventricular internal cavity size was the left ventricle is normal in size. There is mildly increased left ventricular hypertrophy. Concentric left ventricular hypertrophy. Left ventricular diastolic parameters are consistent with  Grade I diastolic dysfunction (impaired relaxation). Indeterminate filling pressures. Right Ventricle: The right ventricular size is normal. No increase in right ventricular wall thickness. Global RV systolic function is has normal systolic function. Left Atrium: Left atrial size was normal in size. Right Atrium: Right atrial size was normal in size Pericardium: There is no evidence of pericardial effusion. Mitral Valve: The mitral valve is grossly normal. Mild mitral annular calcification. Trivial mitral valve regurgitation. Tricuspid Valve: The tricuspid valve is grossly normal. Tricuspid valve regurgitation is trivial. Aortic Valve: The aortic valve  is tricuspid. . There is mild thickening of the aortic valve. Aortic valve regurgitation is not visualized. Mild aortic stenosis is present. Mild aortic valve annular calcification. There is mild thickening of the aortic valve. Aortic valve mean gradient measures 8.0 mmHg. Aortic valve peak gradient measures 16.6 mmHg. Aortic valve area, by VTI measures 1.79 cm. Pulmonic Valve: The pulmonic valve was not well visualized. Pulmonic valve regurgitation is not visualized. Pulmonic regurgitation is not visualized. Aorta: The aortic root is normal in size and structure. Venous: The inferior vena cava is normal in size with greater than 50% respiratory variability, suggesting right atrial pressure of 3 mmHg. IAS/Shunts: No atrial level shunt detected by color flow Doppler.  LEFT VENTRICLE PLAX 2D LVIDd:         4.67 cm  Diastology LVIDs:         2.51 cm  LV e' lateral:   7.83 cm/s LV PW:         1.02 cm  LV E/e' lateral: 12.9 LV IVS:        1.11 cm  LV e' medial:    7.29 cm/s LVOT diam:     2.00 cm  LV E/e' medial:  13.9 LV SV:         78 ml LV SV Index:   37.74 LVOT Area:     3.14 cm  RIGHT VENTRICLE RV S prime:     17.80 cm/s TAPSE (M-mode): 2.3 cm LEFT ATRIUM           Index       RIGHT ATRIUM           Index LA diam:      3.30 cm 1.59 cm/m  RA Area:     20.50 cm LA  Vol (A2C): 49.3 ml 23.76 ml/m RA Volume:   56.70 ml  27.33 ml/m LA Vol (A4C): 47.6 ml 22.94 ml/m  AORTIC VALVE AV Area (Vmax):    1.68 cm AV Area (Vmean):   1.85 cm AV Area (VTI):     1.79 cm AV Vmax:           204.00 cm/s AV Vmean:          131.000 cm/s AV VTI:            0.408 m AV Peak Grad:      16.6 mmHg AV Mean Grad:      8.0 mmHg LVOT Vmax:         109.00 cm/s LVOT Vmean:        77.100 cm/s LVOT VTI:          0.232 m LVOT/AV VTI ratio: 0.57  AORTA Ao Root diam: 3.40 cm MITRAL VALVE MV Area (PHT): 2.91 cm              SHUNTS MV PHT:        75.69 msec            Systemic VTI:  0.23 m MV Decel Time: 261 msec              Systemic Diam: 2.00 cm MV E velocity: 101.00 cm/s 103 cm/s MV A velocity: 110.00 cm/s 70.3 cm/s MV E/A ratio:  0.92        1.5  Kate Sable MD Electronically signed by Kate Sable MD Signature Date/Time: 05/10/2019/3:35:48 PM    Final      CBC Recent Labs  Lab 05/09/19 1923 05/10/19 [id number] 05/13/19 0552 05/14/19 0606 05/15/19 0612  WBC 6.7 5.9  4.7 5.7 5.8  HGB 15.7 14.0 13.7 12.3* 11.8*  HCT 48.7 44.0 40.6 36.9* 35.5*  PLT 170 147* 114* 103* 110*  MCV 90.4 90.9 88.3 88.7 89.2  MCH 29.1 28.9 29.8 29.6 29.6  MCHC 32.2 31.8 33.7 33.3 33.2  RDW 14.4 14.5 13.9 14.1 14.3    Chemistries  Recent Labs  Lab 05/09/19 1923 05/09/19 1923 05/10/19 0643 05/11/19 0820 05/13/19 0552 05/14/19 0606 05/15/19 0612  NA 144   < > 145 142 139 137 139  K 4.1   < > 3.9 3.4* 3.0* 3.1* 3.5  CL 103   < > 107 105 103 103 102  CO2 26   < > 26 25 25 25 26   GLUCOSE 179*   < > 147* 143* 156* 222* 283*  BUN 28*   < > 23 18 12 11 11   CREATININE 1.03   < > 0.90 0.84 0.65 0.79 0.75  CALCIUM 9.6   < > 8.9 8.7* 8.4* 8.1* 8.1*  MG  --   --   --   --   --  1.9 2.0  AST 16  --   --  17  --   --   --   ALT 20  --   --  18  --   --   --   ALKPHOS 72  --   --  68  --   --   --   BILITOT 1.0  --   --  0.9  --   --   --    < > = values in this interval not displayed.    ------------------------------------------------------------------------------------------------------------------ No results for input(s): CHOL, HDL, LDLCALC, TRIG, CHOLHDL, LDLDIRECT in the last 72 hours.  Lab Results  Component Value Date   HGBA1C 7.1 (H) 05/11/2019   ------------------------------------------------------------------------------------------------------------------ No results for input(s): TSH, T4TOTAL, T3FREE, THYROIDAB in the last 72 hours.  Invalid input(s): FREET3 ------------------------------------------------------------------------------------------------------------------ No results for input(s): VITAMINB12, FOLATE, FERRITIN, TIBC, IRON, RETICCTPCT in the last 72 hours.  Coagulation profile No results for input(s): INR, PROTIME in the last 168 hours.  No results for input(s): DDIMER in the last 72 hours.  Cardiac Enzymes No results for input(s): CKMB, TROPONINI, MYOGLOBIN in the last 168 hours.  Invalid input(s): CK ------------------------------------------------------------------------------------------------------------------ No results found for: BNP   Phillips Climes M.D on 05/15/2019 at 1:41 PM  Go to [email or web address] - for contact info  [facility] - Office  [phone]

## 2019-05-16 ENCOUNTER — Other Ambulatory Visit (HOSPITAL_COMMUNITY): Payer: Self-pay | Admitting: *Deleted

## 2019-05-16 DIAGNOSIS — C159 Malignant neoplasm of esophagus, unspecified: Secondary | ICD-10-CM | POA: Diagnosis not present

## 2019-05-16 DIAGNOSIS — Z934 Other artificial openings of gastrointestinal tract status: Secondary | ICD-10-CM | POA: Diagnosis not present

## 2019-05-16 DIAGNOSIS — Z452 Encounter for adjustment and management of vascular access device: Secondary | ICD-10-CM | POA: Diagnosis not present

## 2019-05-16 DIAGNOSIS — Z483 Aftercare following surgery for neoplasm: Secondary | ICD-10-CM | POA: Diagnosis not present

## 2019-05-16 DIAGNOSIS — I10 Essential (primary) hypertension: Secondary | ICD-10-CM | POA: Diagnosis not present

## 2019-05-16 DIAGNOSIS — J449 Chronic obstructive pulmonary disease, unspecified: Secondary | ICD-10-CM | POA: Diagnosis not present

## 2019-05-16 LAB — GLUCOSE, CAPILLARY
Glucose-Capillary: 257 mg/dL — ABNORMAL HIGH (ref 70–99)
Glucose-Capillary: 282 mg/dL — ABNORMAL HIGH (ref 70–99)
Glucose-Capillary: 323 mg/dL — ABNORMAL HIGH (ref 70–99)

## 2019-05-16 LAB — CBC
HCT: 35.5 % — ABNORMAL LOW (ref 39.0–52.0)
Hemoglobin: 11.7 g/dL — ABNORMAL LOW (ref 13.0–17.0)
MCH: 29.4 pg (ref 26.0–34.0)
MCHC: 33 g/dL (ref 30.0–36.0)
MCV: 89.2 fL (ref 80.0–100.0)
Platelets: 111 10*3/uL — ABNORMAL LOW (ref 150–400)
RBC: 3.98 MIL/uL — ABNORMAL LOW (ref 4.22–5.81)
RDW: 14.5 % (ref 11.5–15.5)
WBC: 5.3 10*3/uL (ref 4.0–10.5)
nRBC: 0 % (ref 0.0–0.2)

## 2019-05-16 LAB — BASIC METABOLIC PANEL
Anion gap: 10 (ref 5–15)
BUN: 15 mg/dL (ref 8–23)
CO2: 27 mmol/L (ref 22–32)
Calcium: 8 mg/dL — ABNORMAL LOW (ref 8.9–10.3)
Chloride: 104 mmol/L (ref 98–111)
Creatinine, Ser: 0.64 mg/dL (ref 0.61–1.24)
GFR calc Af Amer: >60 mL/min (ref >60)
GFR calc non Af Amer: >60 mL/min (ref >60)
Glucose, Bld: 278 mg/dL — ABNORMAL HIGH (ref 70–99)
Potassium: 3.3 mmol/L — ABNORMAL LOW (ref 3.5–5.1)
Sodium: 141 mmol/L (ref 135–145)

## 2019-05-16 LAB — MAGNESIUM: Magnesium: 2.1 mg/dL (ref 1.7–2.4)

## 2019-05-16 LAB — PHOSPHORUS: Phosphorus: 3.1 mg/dL (ref 2.5–4.6)

## 2019-05-16 MED ORDER — TERAZOSIN HCL 10 MG PO CAPS: 10.0000 mg | ORAL_CAPSULE | Freq: Every day | ORAL | 0 refills | Status: AC

## 2019-05-16 MED ORDER — FAMOTIDINE 20 MG PO TABS: 20.0000 mg | ORAL_TABLET | Freq: Every day | ORAL | Status: DC

## 2019-05-16 MED ORDER — FREE WATER: 300.0000 mL | Freq: Three times a day (TID) | 0 refills | Status: DC

## 2019-05-16 MED ORDER — PANTOPRAZOLE SODIUM 40 MG PO PACK: 40.0000 mg | PACK | Freq: Every day | ORAL | 0 refills | Status: DC

## 2019-05-16 MED ORDER — ACETAMINOPHEN 325 MG PO TABS: 650.0000 mg | ORAL_TABLET | Freq: Four times a day (QID) | ORAL | Status: AC | PRN

## 2019-05-16 MED ORDER — ATORVASTATIN CALCIUM 40 MG PO TABS: 40.0000 mg | ORAL_TABLET | Freq: Every evening | ORAL | Status: DC

## 2019-05-16 MED ORDER — METFORMIN HCL 1000 MG PO TABS: 1000.0000 mg | ORAL_TABLET | Freq: Two times a day (BID) | ORAL | 0 refills | Status: AC

## 2019-05-16 MED ORDER — FLUOXETINE HCL 20 MG PO CAPS: 20.0000 mg | ORAL_CAPSULE | Freq: Every day | ORAL | 0 refills | Status: AC

## 2019-05-16 MED ORDER — ALPRAZOLAM 0.5 MG PO TABS: 0.5000 mg | ORAL_TABLET | Freq: Two times a day (BID) | ORAL | 0 refills | Status: DC

## 2019-05-16 MED ORDER — PRO-STAT SUGAR FREE PO LIQD: 30.0000 mL | Freq: Two times a day (BID) | ORAL | 1 refills | Status: AC

## 2019-05-16 MED ORDER — GLIPIZIDE 10 MG PO TABS: 10.0000 mg | ORAL_TABLET | Freq: Two times a day (BID) | ORAL | 0 refills | Status: DC

## 2019-05-16 MED ORDER — FREE WATER: 300.0000 mL | Freq: Three times a day (TID) | Status: DC

## 2019-05-16 MED ORDER — OSMOLITE 1.5 CAL PO LIQD: 1000.0000 mL | ORAL | 3 refills | Status: AC

## 2019-05-16 MED ORDER — POTASSIUM CHLORIDE 20 MEQ/15ML (10%) PO SOLN
40.0000 meq | Freq: Once | ORAL | Status: AC
  Administered 2019-05-16: 10:00:00 40 meq
  Filled 2019-05-16: qty 30

## 2019-05-16 MED ORDER — OSMOLITE 1.5 CAL PO LIQD: 1000.0000 mL | ORAL | Status: DC

## 2019-05-16 NOTE — Discharge Instructions (Signed)
Follow with Primary MD Glenda Chroman, MD in 7 days   Get CBC, CMP,  checked  by Primary MD next visit.    Activity: As tolerated with Full fall precautions use walker/cane & assistance as needed   Disposition Home    Diet: Tube feed  On your next visit with your primary care physician please Get Medicines reviewed and adjusted.   Please request your Prim.MD to go over all Hospital Tests and Procedure/Radiological results at the follow up, please get all Hospital records sent to your Prim MD by signing hospital release before you go home.   If you experience worsening of your admission symptoms, develop shortness of breath, life threatening emergency, suicidal or homicidal thoughts you must seek medical attention immediately by calling 911 or calling your MD immediately  if symptoms less severe.  You Must read complete instructions/literature along with all the possible adverse reactions/side effects for all the Medicines you take and that have been prescribed to you. Take any new Medicines after you have completely understood and accpet all the possible adverse reactions/side effects.   Do not drive, operating heavy machinery, perform activities at heights, swimming or participation in water activities or provide baby sitting services if your were admitted for syncope or siezures until you have seen by Primary MD or a Neurologist and advised to do so again.  Do not drive when taking Pain medications.    Do not take more than prescribed Pain, Sleep and Anxiety Medications  Special Instructions: If you have smoked or chewed Tobacco  in the last 2 yrs please stop smoking, stop any regular Alcohol  and or any Recreational drug use.  Wear Seat belts while driving.   Please note  You were cared for by a hospitalist during your hospital stay. If you have any questions about your discharge medications or the care you received while you were in the hospital after you are discharged, you  can call the unit and asked to speak with the hospitalist on call if the hospitalist that took care of you is not available. Once you are discharged, your primary care physician will handle any further medical issues. Please note that NO REFILLS for any discharge medications will be authorized once you are discharged, as it is imperative that you return to your primary care physician (or establish a relationship with a primary care physician if you do not have one) for your aftercare needs so that they can reassess your need for medications and monitor your lab values.

## 2019-05-16 NOTE — Progress Notes (Signed)
Inpatient Diabetes Program Recommendations  AACE/ADA: New Consensus Statement on Inpatient Glycemic Control (2015)  Target Ranges:  Prepandial:   less than 140 mg/dL      Peak postprandial:   less than 180 mg/dL (1-2 hours)      Critically ill patients:  140 - 180 mg/dL   Lab Results  Component Value Date   GLUCAP 323 (H) 05/16/2019   HGBA1C 7.1 (H) 05/11/2019    Review of Glycemic Control   Results for LAUREN, LADUCA (MRN [id number]) as of 05/16/2019 11:09  Ref. Range 05/15/2019 19:52 05/15/2019 23:57 05/16/2019 04:34 05/16/2019 07:28 05/16/2019 11:04  Glucose-Capillary Latest Ref Range: 70 - 99 mg/dL 207 (H) 219 (H) 257 (H) 282 (H) 323 (H)   Diabetes history: DM2 Outpatient Diabetes medications: Metformin 1000 mg BID + Glipizide 10 mg BID Current orders for Inpatient glycemic control: Glipizide 10 mg QD + Novolog 0-15 TID + 0-5 units QHS  Inpatient Diabetes Program Recommendations:    -Novolog 3 units Q4H with tube feeds.  Stop if tube feeds are held or discontinued  Thank you, Reche Dixon, RN, BSN Diabetes Coordinator Inpatient Diabetes Program [phone] (team pager from 8a-5p)

## 2019-05-16 NOTE — Progress Notes (Signed)
Kurt Duran, is a 74 y.o. male  DOB 03-14-1946  MRN [id number].  Admission date:  05/09/2019  Admitting Physician  Roxan Hockey, MD  Discharge Date:  05/16/2019   Primary MD  Glenda Chroman, MD  Recommendations for primary care physician for things to follow:  -Please check CBC, CMP during next visit. -Please adjust diabetic regimen if indicated, he is currently on continuous tube feed. -Patient to follow with oncology as an outpatient regarding PET scan, and CT surgery referral.   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 Anxiety   . COPD (chronic obstructive pulmonary disease) ([place])   . Diabetes mellitus without complication ([place])   . Diabetic retinopathy ([place])    NPDR OU  . GERD (gastroesophageal reflux disease)   . Gout   . Hypertension   . Hypertensive retinopathy    OU    Past Surgical History:  Procedure Laterality Date  . BIOPSY  05/10/2019   Procedure: BIOPSY;  Surgeon: Danie Binder, MD;  [facility];  Service: Endoscopy;;  gastric esophageal  . CATARACT EXTRACTION W/PHACO Left 04/03/2016   Procedure: CATARACT EXTRACTION PHACO AND INTRAOCULAR LENS PLACEMENT LEFT EYE CDE= 11.33;  Surgeon: Tonny Branch, MD;  Location: [facility];  Service: Ophthalmology;  Laterality: Left;   left  . CATARACT EXTRACTION W/PHACO Right 04/28/2016   Procedure: CATARACT EXTRACTION PHACO AND INTRAOCULAR LENS PLACEMENT (IOC);  Surgeon: Tonny Branch, MD;  Location: [facility];  Service: Ophthalmology;  Laterality: Right;  [id number]   . ESOPHAGOGASTRODUODENOSCOPY (EGD) WITH PROPOFOL N/A 05/10/2019   Procedure: ESOPHAGOGASTRODUODENOSCOPY (EGD) WITH PROPOFOL with dilation as appropriate.;  Surgeon: Danie Binder, MD;  [facility];  Service: Endoscopy;  Laterality: N/A;  . EYE SURGERY Bilateral    Cat Sx OU  . JEJUNOSTOMY N/A 05/13/2019   Procedure: JEJUNOSTOMY FEEDING TUBE PLACEMENT (Procedure #2);  Surgeon: Aviva Signs, MD;  Location: [facility];  Service: General;  Laterality: N/A;  . KNEE ARTHROSCOPY Right   . NECK SURGERY    . PORTACATH PLACEMENT Right 05/13/2019   Procedure: INSERTION PORT-A-CATH (attached catheter in right subclavian) (Procedure #1);  Surgeon: Aviva Signs, MD;  Location: [facility];  Service: General;  Laterality: Right;  . ROTATOR CUFF REPAIR Right        History of present illness and  Hospital Course:     Kindly see H&P for history of present illness and admission details, please review complete Labs, Consult reports and  Test reports for all details in brief  HPI  from the history and physical done on the day of admission 05/09/2019  HPI: Kurt Duran is a 74 y.o. male with medical history significant of COPD, diabetes mellitus type 2, hypertension, hyperlipidemia, gout, depression, anxiety who presented to the ER with vomiting.  As per patient and family, his symptoms started about 4 weeks ago with epigastric burning, nausea and intermittent vomiting.  Patient was placed on oral pantoprazole as outpatient with no significant improvement in symptoms.  He has had persistent vomiting, decreased ability to tolerate p.o., and persistent reflux, burning sensation in throat and epigastric region.  Symptoms have worsened over the last several days.  He had significant  loss of weight related to decreased intake.  Was scheduled to see GI as outpatient on 11th February however he continued to have symptoms and decided to come to the ER.  Has had no significant dysphagia but does report that liquids and food tend to come back from upper stomach and lower esophageal region.  Does not feel like any food is stuck in the lower esophagus.  Has had no fever, chills, no abdominal discomfort, no diarrhea. ED Course:  Vital Signs reviewed on presentation, significant for temperature 98.4, heart rate 83, blood pressure 155/82, saturation 99% on room air. Labs reviewed, significant for sodium 144, potassium 4.1, BUN 28, creatinine 1.03, LFTs within normal limits, troponin negative x2, WBC count 6.7, hemoglobin 15.7, hematocrit 48, platelets 170. Imaging personally Reviewed, CT of the chest, abdomen, pelvis shows dilatation of esophagus measuring 2.7 x 1.8 cm with a small hiatal hernia.  Multilevel degenerative changes throughout the thoracic spine. No other acute cardiac, pulmonary, intra-abdominal abnormality.  Diverticulosis of descending colon without evidence of diverticulitis. EKG personally reviewed, shows sinus rhythm, no acute ST-T changes.   Hospital Course   75 y.o.malewith medical history significant ofCOPD, diabetes mellitus type 2, hypertension, hyperlipidemia, gout, depression, anxiety admitted on 05/09/2019 with intractable emesis, EGD on 05/10/2019 revealed esophageal mass in the lower third, pathology consistent with adenocarcinoma.  Status post Port-A-Cath/surgical J-tube 2/5 by Dr. Arnoldo Morale.  Esophageal Mass/adenocarcinoma -EGD on 05/10/2019 with mass in the lower third of the esophagus consistent with adenocarcinoma -Oncology on board, Dr. Delton Coombes  input greatly appreciated, plan for PET scan as an outpatient, likely will need chemotherapy, he is arranging for outpatient referral to CT surgery in [facility] -Status post EGD by  Dr. Oneida Alar -Patient went  for  open J-tube procedure/Port-A-Cath earlier this morning/ -Chemo/radiation and then possibly down the road resection/surgery of the esophageal mass  COPD - no acute exacerbation at this time, continue bronchodilators  DM2-    -Given poor oral intake, initially holding  Metformin and glipizide, he did require CBG during hospital stay, now he is on tube tube, CBG started to increase, will resume on his home glipizide and Metformin.   Depression/Anxiety -Resume home medication on discharge  Hypokalemia/hypophosphatemia -High risk for refeeding syndrome,  letter closely, required supplementation, stable at time of discharge.   Severe protein calorie malnutrition -This is in the context of poor oral intake from esophageal malignancy. -Nutritionist input greatly appreciated, patient has been started on tube feeds via J-tube, home health arranged.   Discharge Condition:  [place]   Follow UP  Follow-up Information    Aviva Signs, MD. Schedule an appointment as soon as possible for a visit on 05/31/2019.   Specialty: General Surgery Contact information: [address] [id number] [phone]  Derek Jack, MD.   Specialty: Hematology Contact information: [place] [address] [phone]             Discharge Instructions  and  Discharge Medications    Discharge Instructions    Discharge instructions   Complete by: As directed    Follow with Primary MD Glenda Chroman, MD in 7 days   Get CBC, CMP,  checked  by Primary MD next visit.    Activity: As tolerated with Full fall precautions use walker/cane & assistance as needed   Disposition Home    Diet: Tube feed  On your next visit with your primary care physician please Get Medicines reviewed and adjusted.   Please request your Prim.MD to go over all Hospital Tests and Procedure/Radiological results at the follow up, please get all Hospital records sent to  your Prim MD by signing hospital release before you go home.   If you experience worsening of your admission symptoms, develop shortness of breath, life threatening emergency, suicidal or homicidal thoughts you must seek medical attention immediately by calling 911 or calling your MD immediately  if symptoms less severe.  You Must read complete instructions/literature along with all the possible adverse reactions/side effects for all the Medicines you take and that have been prescribed to you. Take any new Medicines after you have completely understood and accpet all the possible adverse reactions/side effects.   Do not drive, operating heavy machinery, perform activities at heights, swimming or participation in water activities or provide baby sitting services if your were admitted for syncope or siezures until you have seen by Primary MD or a Neurologist and advised to do so again.  Do not drive when taking Pain medications.    Do not take more than prescribed Pain, Sleep and Anxiety Medications  Special Instructions: If you have smoked or chewed Tobacco  in the last 2 yrs please stop smoking, stop any regular Alcohol  and or any Recreational drug use.  Wear Seat belts while driving.   Please note  You were cared for by a hospitalist during your hospital stay. If you have any questions about your discharge medications or the care you received while you were in the hospital after you are discharged, you can call the unit and asked to speak with the hospitalist on call if the hospitalist that took care of you is not available. Once you are discharged, your primary care physician will handle any further medical issues. Please note that NO REFILLS for any discharge medications will be authorized once you are discharged, as it is imperative that you return to your primary care physician (or establish a relationship with a primary care physician if you do not have one) for your aftercare needs so  that they can reassess your need for medications and monitor your lab values.   Increase activity slowly   Complete by: As directed      Allergies as of 05/16/2019      Reactions   Eggs Or Egg-derived Products Nausea And Vomiting      Medication List    STOP taking these medications   Calcium-Magnesium-Zinc Tabs   Farxiga 10 MG Tabs tablet Generic drug: dapagliflozin propanediol   Fish Oil 1000 MG Caps   gabapentin 100 MG capsule Commonly known as: NEURONTIN   glipiZIDE 10 MG 24 hr tablet Commonly known as: GLUCOTROL XL Replaced by: glipiZIDE 10 MG tablet   pantoprazole 40 MG tablet Commonly known as: PROTONIX Replaced  by: pantoprazole sodium 40 mg/20 mL Pack     TAKE these medications   acetaminophen 325 MG tablet Commonly known as: TYLENOL Place 2 tablets (650 mg total) into feeding tube every 6 (six) hours as needed for mild pain (or Fever >/= 101).   albuterol 108 (90 Base) MCG/ACT inhaler Commonly known as: VENTOLIN HFA Inhale 2 puffs into the lungs every 4 (four) hours as needed for wheezing or shortness of breath.   albuterol (2.5 MG/3ML) 0.083% nebulizer solution Commonly known as: PROVENTIL Take 2.5 mg by nebulization every 6 (six) hours as needed for wheezing or shortness of breath.   ALPRAZolam 0.5 MG tablet Commonly known as: XANAX 1 tablet (0.5 mg total) by Per J Tube route 2 (two) times daily. What changed: how to take this   atorvastatin 40 MG tablet Commonly known as: LIPITOR 1 tablet (40 mg total) by Per J Tube route every evening. for cholesterol What changed: how to take this   famotidine 20 MG tablet Commonly known as: PEPCID Place 1 tablet (20 mg total) into feeding tube daily. What changed: how to take this   feeding supplement (OSMOLITE 1.5 CAL) Liqd Place 1,000 mLs into feeding tube continuous.   feeding supplement (PRO-STAT SUGAR FREE 64) Liqd 30 mLs by Per J Tube route 2 (two) times daily.   FLUoxetine 20 MG capsule Commonly  known as: PROZAC Place 1 capsule (20 mg total) into feeding tube daily. What changed: how to take this   glipiZIDE 10 MG tablet Commonly known as: GLUCOTROL Place 1 tablet (10 mg total) into feeding tube 2 (two) times daily before a meal. Replaces: glipiZIDE 10 MG 24 hr tablet   metFORMIN 1000 MG tablet Commonly known as: GLUCOPHAGE 1 tablet (1,000 mg total) by Per J Tube route 2 (two) times daily. What changed: how to take this   pantoprazole sodium 40 mg/20 mL Pack Commonly known as: PROTONIX Place 20 mLs (40 mg total) into feeding tube daily. Replaces: pantoprazole 40 MG tablet   terazosin 10 MG capsule Commonly known as: HYTRIN 1 capsule (10 mg total) by Per J Tube route daily. What changed: how to take this         Diet and Activity recommendation: See Discharge Instructions above   Consults obtained -  Gi/Oncology/gen surgery    Major procedures and Radiology Reports - PLEASE review detailed and final reports for all details, in brief -   -EGD on 05/10/2019 revealed esophageal mass in the lower third, pathology consistent with adenocarcinoma jejunostomy tube placement, portacath placement on 05/13/19 - Procedure:Feeding jejunostomy tube placement, Port-A-Cath placement by Aviva Signs, MD 05/13/2019   CT Chest W Contrast  Result Date: 05/09/2019 CLINICAL DATA:  Nausea and vomiting. EXAM: CT CHEST, ABDOMEN, AND PELVIS WITH CONTRAST TECHNIQUE: Multidetector CT imaging of the chest, abdomen and pelvis was performed following the standard protocol during bolus administration of intravenous contrast. CONTRAST:  142mL OMNIPAQUE IOHEXOL 300 MG/ML  SOLN COMPARISON:  Abdomen pelvis CT, dated January 14, 2007, is available for comparison. FINDINGS: CT CHEST FINDINGS Cardiovascular: There is moderate severity calcification of the thoracic aorta. Normal heart size. No pericardial effusion. Marked severity coronary artery calcification is seen. Mediastinum/Nodes: No enlarged  mediastinal, hilar, or axillary lymph nodes. Small cystic structures are seen within both lobes of the thyroid gland. Dilatation of the esophagus is seen which measures approximately 2.7 cm x 1.8 cm. A small hiatal hernia is noted. Lungs/Pleura: Lungs are clear. No pleural effusion or pneumothorax. Musculoskeletal: Multilevel degenerative  changes seen throughout the thoracic spine. CT ABDOMEN PELVIS FINDINGS Hepatobiliary: No focal liver abnormality is seen. No gallstones, gallbladder wall thickening, or biliary dilatation. Pancreas: Unremarkable. No pancreatic ductal dilatation or surrounding inflammatory changes. Spleen: Normal in size without focal abnormality. Adrenals/Urinary Tract: The right adrenal gland is normal in appearance. 0.6 cm 1.0 cm fat density left adrenal masses are seen. Kidneys are normal, without renal calculi or hydronephrosis. A 5 mm cyst is seen within the posterolateral aspect of the mid right kidney. Bladder is unremarkable. Stomach/Bowel: Stomach is within normal limits. Appendix appears normal. No evidence of bowel wall thickening, distention, or inflammatory changes. Noninflamed diverticula is seen throughout the descending colon. Vascular/Lymphatic: Moderate to marked severity aortic atherosclerosis. No enlarged abdominal or pelvic lymph nodes. Reproductive: Prostate is unremarkable. Other: No abdominal wall hernia or abnormality. No abdominopelvic ascites. Musculoskeletal: A predominant stable 9 mm sclerotic focus is seen within the posterior aspect of the sacrum on the left. Degenerative changes seen throughout the lumbar spine. IMPRESSION: 1. No evidence of acute or active cardiopulmonary disease. 2. Small hiatal hernia. 3. Small left adrenal myelolipomas. 4. Noninflamed diverticula of the descending colon. Electronically Signed   By: Virgina Norfolk M.D.   On: 05/09/2019 22:07   CT ABDOMEN PELVIS W CONTRAST  Result Date: 05/09/2019 CLINICAL DATA:  Nausea and vomiting. EXAM: CT  CHEST, ABDOMEN, AND PELVIS WITH CONTRAST TECHNIQUE: Multidetector CT imaging of the chest, abdomen and pelvis was performed following the standard protocol during bolus administration of intravenous contrast. CONTRAST:  149mL OMNIPAQUE IOHEXOL 300 MG/ML  SOLN COMPARISON:  Abdomen pelvis CT, dated January 14, 2007, is available for comparison. FINDINGS: CT CHEST FINDINGS Cardiovascular: There is moderate severity calcification of the thoracic aorta. Normal heart size. No pericardial effusion. Marked severity coronary artery calcification is seen. Mediastinum/Nodes: No enlarged mediastinal, hilar, or axillary lymph nodes. Small cystic structures are seen within both lobes of the thyroid gland. Dilatation of the esophagus is seen which measures approximately 2.7 cm x 1.8 cm. A small hiatal hernia is noted. Lungs/Pleura: Lungs are clear. No pleural effusion or pneumothorax. Musculoskeletal: Multilevel degenerative changes seen throughout the thoracic spine. CT ABDOMEN PELVIS FINDINGS Hepatobiliary: No focal liver abnormality is seen. No gallstones, gallbladder wall thickening, or biliary dilatation. Pancreas: Unremarkable. No pancreatic ductal dilatation or surrounding inflammatory changes. Spleen: Normal in size without focal abnormality. Adrenals/Urinary Tract: The right adrenal gland is normal in appearance. 0.6 cm 1.0 cm fat density left adrenal masses are seen. Kidneys are normal, without renal calculi or hydronephrosis. A 5 mm cyst is seen within the posterolateral aspect of the mid right kidney. Bladder is unremarkable. Stomach/Bowel: Stomach is within normal limits. Appendix appears normal. No evidence of bowel wall thickening, distention, or inflammatory changes. Noninflamed diverticula is seen throughout the descending colon. Vascular/Lymphatic: Moderate to marked severity aortic atherosclerosis. No enlarged abdominal or pelvic lymph nodes. Reproductive: Prostate is unremarkable. Other: No abdominal wall hernia  or abnormality. No abdominopelvic ascites. Musculoskeletal: A predominant stable 9 mm sclerotic focus is seen within the posterior aspect of the sacrum on the left. Degenerative changes seen throughout the lumbar spine. IMPRESSION: 1. No evidence of acute or active cardiopulmonary disease. 2. Small hiatal hernia. 3. Small left adrenal myelolipomas. 4. Noninflamed diverticula of the descending colon. Electronically Signed   By: Virgina Norfolk M.D.   On: 05/09/2019 22:07   DG Chest Port 1 View  Result Date: 05/13/2019 CLINICAL DATA:  Status post Port-A-Cath placement today. EXAM: PORTABLE CHEST 1 VIEW COMPARISON:  Single-view  of the chest 12/17/2017. FINDINGS: Right subclavian approach Port-A-Cath is in place with the tip in the mid to lower superior vena cava. Tubing is intact. No pneumothorax. Lungs clear. Heart size normal. Atherosclerosis noted. The patient is status post right shoulder surgery. IMPRESSION: Tip of right subclavian approach Port-A-Cath projects in the mid to lower superior vena cava. No pneumothorax. No acute disease. Atherosclerosis. Electronically Signed   By: Inge Rise M.D.   On: 05/13/2019 09:42   DG C-Arm 1-60 Min-No Report  Result Date: 05/13/2019 Fluoroscopy was utilized by the requesting physician.  No radiographic interpretation.   ECHOCARDIOGRAM COMPLETE  Result Date: 05/10/2019   ECHOCARDIOGRAM REPORT   Patient Name:   JONERIK CULL Date of Exam: 05/10/2019 Medical Rec #:  [id number]       Height:       73.0 in Accession #:    [id number]      Weight:       183.6 lb Date of Birth:  July 19, 1945       BSA:          2.08 m Patient Age:    70 years        BP:           161/84 mmHg Patient Gender: M               HR:           81 bpm. Exam Location:  [facility] Procedure: 2D Echo, Cardiac Doppler and Color Doppler Indications:    Dyspnea 786.09 / R06.00  History:        Patient has no prior history of Echocardiogram examinations.                 COPD; Risk Factors:Hypertension  and Diabetes. GERD.  Sonographer:    Alvino Chapel RCS Referring Phys: [phone] COURAGE EMOKPAE IMPRESSIONS  1. Left ventricular ejection fraction, by visual estimation, is 60 to 65%. The left ventricle has normal function. There is mildly increased left ventricular hypertrophy.  2. Left ventricular diastolic parameters are consistent with Grade I diastolic dysfunction (impaired relaxation).  3. The left ventricle has no regional wall motion abnormalities.  4. Global right ventricle has normal systolic function.The right ventricular size is normal. No increase in right ventricular wall thickness.  5. Left atrial size was normal.  6. Right atrial size was normal.  7. Mild mitral annular calcification.  8. The mitral valve is grossly normal. Trivial mitral valve regurgitation.  9. The tricuspid valve is grossly normal. 10. The tricuspid valve is grossly normal. Tricuspid valve regurgitation is trivial. 11. The aortic valve is tricuspid. Aortic valve regurgitation is not visualized. Mild aortic valve stenosis. 12. The pulmonic valve was not well visualized. Pulmonic valve regurgitation is not visualized. 13. The inferior vena cava is normal in size with greater than 50% respiratory variability, suggesting right atrial pressure of 3 mmHg. FINDINGS  Left Ventricle: Left ventricular ejection fraction, by visual estimation, is 60 to 65%. The left ventricle has normal function. The left ventricle has no regional wall motion abnormalities. The left ventricular internal cavity size was the left ventricle is normal in size. There is mildly increased left ventricular hypertrophy. Concentric left ventricular hypertrophy. Left ventricular diastolic parameters are consistent with Grade I diastolic dysfunction (impaired relaxation). Indeterminate filling pressures. Right Ventricle: The right ventricular size is normal. No increase in right ventricular wall thickness. Global RV systolic function is has normal systolic function. Left  Atrium: Left atrial size was  normal in size. Right Atrium: Right atrial size was normal in size Pericardium: There is no evidence of pericardial effusion. Mitral Valve: The mitral valve is grossly normal. Mild mitral annular calcification. Trivial mitral valve regurgitation. Tricuspid Valve: The tricuspid valve is grossly normal. Tricuspid valve regurgitation is trivial. Aortic Valve: The aortic valve is tricuspid. . There is mild thickening of the aortic valve. Aortic valve regurgitation is not visualized. Mild aortic stenosis is present. Mild aortic valve annular calcification. There is mild thickening of the aortic valve. Aortic valve mean gradient measures 8.0 mmHg. Aortic valve peak gradient measures 16.6 mmHg. Aortic valve area, by VTI measures 1.79 cm. Pulmonic Valve: The pulmonic valve was not well visualized. Pulmonic valve regurgitation is not visualized. Pulmonic regurgitation is not visualized. Aorta: The aortic root is normal in size and structure. Venous: The inferior vena cava is normal in size with greater than 50% respiratory variability, suggesting right atrial pressure of 3 mmHg. IAS/Shunts: No atrial level shunt detected by color flow Doppler.  LEFT VENTRICLE PLAX 2D LVIDd:         4.67 cm  Diastology LVIDs:         2.51 cm  LV e' lateral:   7.83 cm/s LV PW:         1.02 cm  LV E/e' lateral: 12.9 LV IVS:        1.11 cm  LV e' medial:    7.29 cm/s LVOT diam:     2.00 cm  LV E/e' medial:  13.9 LV SV:         78 ml LV SV Index:   37.74 LVOT Area:     3.14 cm  RIGHT VENTRICLE RV S prime:     17.80 cm/s TAPSE (M-mode): 2.3 cm LEFT ATRIUM           Index       RIGHT ATRIUM           Index LA diam:      3.30 cm 1.59 cm/m  RA Area:     20.50 cm LA Vol (A2C): 49.3 ml 23.76 ml/m RA Volume:   56.70 ml  27.33 ml/m LA Vol (A4C): 47.6 ml 22.94 ml/m  AORTIC VALVE AV Area (Vmax):    1.68 cm AV Area (Vmean):   1.85 cm AV Area (VTI):     1.79 cm AV Vmax:           204.00 cm/s AV Vmean:          131.000  cm/s AV VTI:            0.408 m AV Peak Grad:      16.6 mmHg AV Mean Grad:      8.0 mmHg LVOT Vmax:         109.00 cm/s LVOT Vmean:        77.100 cm/s LVOT VTI:          0.232 m LVOT/AV VTI ratio: 0.57  AORTA Ao Root diam: 3.40 cm MITRAL VALVE MV Area (PHT): 2.91 cm              SHUNTS MV PHT:        75.69 msec            Systemic VTI:  0.23 m MV Decel Time: 261 msec              Systemic Diam: 2.00 cm MV E velocity: 101.00 cm/s 103 cm/s MV A velocity: 110.00 cm/s 70.3 cm/s MV  E/A ratio:  0.92        1.5  Kate Sable MD Electronically signed by Kate Sable MD Signature Date/Time: 05/10/2019/3:35:48 PM    Final     Micro Results    Recent Results (from the past 240 hour(s))  Respiratory Panel by RT PCR (Flu A&B, Covid) - Nasopharyngeal Swab     Status: None   Collection Time: 05/09/19 10:37 PM   Specimen: Nasopharyngeal Swab  Result Value Ref Range Status   SARS Coronavirus 2 by RT PCR NEGATIVE NEGATIVE Final    Comment: (NOTE) SARS-CoV-2 target nucleic acids are NOT DETECTED. The SARS-CoV-2 RNA is generally detectable in upper respiratoy specimens during the acute phase of infection. The lowest concentration of SARS-CoV-2 viral copies this assay can detect is 131 copies/mL. A negative result does not preclude SARS-Cov-2 infection and should not be used as the sole basis for treatment or other patient management decisions. A negative result may occur with  improper specimen collection/handling, submission of specimen other than nasopharyngeal swab, presence of viral mutation(s) within the areas targeted by this assay, and inadequate number of viral copies (<131 copies/mL). A negative result must be combined with clinical observations, patient history, and epidemiological information. The expected result is Negative. Fact Sheet for Patients:  PinkCheek.be Fact Sheet for Healthcare Providers:  GravelBags.it This test is  not yet ap proved or cleared by the [place] FDA and  has been authorized for detection and/or diagnosis of SARS-CoV-2 by FDA under an Emergency Use Authorization (EUA). This EUA will remain  in effect (meaning this test can be used) for the duration of the COVID-19 declaration under Section 564(b)(1) of the Act, 21 U.S.C. section 360bbb-3(b)(1), unless the authorization is terminated or revoked sooner.    Influenza A by PCR NEGATIVE NEGATIVE Final   Influenza B by PCR NEGATIVE NEGATIVE Final    Comment: (NOTE) The Xpert Xpress SARS-CoV-2/FLU/RSV assay is intended as an aid in  the diagnosis of influenza from Nasopharyngeal swab specimens and  should not be used as a sole basis for treatment. Nasal washings and  aspirates are unacceptable for Xpert Xpress SARS-CoV-2/FLU/RSV  testing. Fact Sheet for Patients: PinkCheek.be Fact Sheet for Healthcare Providers: GravelBags.it This test is not yet approved or cleared by the [place] FDA and  has been authorized for detection and/or diagnosis of SARS-CoV-2 by  FDA under an Emergency Use Authorization (EUA). This EUA will remain  in effect (meaning this test can be used) for the duration of the  Covid-19 declaration under Section 564(b)(1) of the Act, 21  U.S.C. section 360bbb-3(b)(1), unless the authorization is  terminated or revoked. Performed at [facility], [address] [place], [place] [address]   Surgical PCR screen     Status: None   Collection Time: 05/12/19  4:38 PM   Specimen: Nasal Mucosa; Nasal Swab  Result Value Ref Range Status   MRSA, PCR NEGATIVE NEGATIVE Final   Staphylococcus aureus NEGATIVE NEGATIVE Final    Comment: (NOTE) The Xpert SA Assay (FDA approved for NASAL specimens in patients 83 years of age and older), is one component of a comprehensive surveillance program. It is not intended to diagnose infection nor to guide or monitor  treatment. Performed at [facility], [address] [place], [place] [address]        Today   Subjective:   Han Weninger today has no headache,no chest or abdominal pain,no new weakness tingling or numbness, feels much better wants to go home today.  Objective:   Blood pressure 123/68, pulse 85, temperature 98.2 F (36.8 C), temperature source Oral, resp. rate 20, height 6\' 1"  (1.854 m), weight 91.7 kg, SpO2 99 %.   Intake/Output Summary (Last 24 hours) at 05/16/2019 1135 Last data filed at 05/16/2019 0300 Gross per 24 hour  Intake 3356.8 ml  Output --  Net 3356.8 ml    Exam Awake Alert, Oriented x 3, No new F.N deficits, Normal affect Symmetrical Chest wall movement, Good air movement bilaterally, CTAB, right upper Port-A-Cath site looks clean RRR,No Gallops,Rubs or new Murmurs, No Parasternal Heave +ve B.Sounds, Abd Soft, Non tender, G-tube in left abdomen, right midline surgical wound healing nicely No Cyanosis, Clubbing or edema, No new Rash or bruise  Data Review   CBC w Diff:  Lab Results  Component Value Date   WBC 5.3 05/16/2019   HGB 11.7 (L) 05/16/2019   HCT 35.5 (L) 05/16/2019   PLT 111 (L) 05/16/2019    CMP:  Lab Results  Component Value Date   NA 141 05/16/2019   K 3.3 (L) 05/16/2019   CL 104 05/16/2019   CO2 27 05/16/2019   BUN 15 05/16/2019   CREATININE 0.64 05/16/2019   PROT 6.8 05/11/2019   ALBUMIN 4.1 05/11/2019   BILITOT 0.9 05/11/2019   ALKPHOS 68 05/11/2019   AST 17 05/11/2019   ALT 18 05/11/2019  .   Total Time in preparing paper work, data evaluation and todays exam - 61 minutes  Phillips Climes M.D on 05/16/2019 at 11:35 AM  [facility]   Office  [phone]

## 2019-05-16 NOTE — Evaluation (Signed)
Physical Therapy Evaluation Patient Details Name: LEGRAND LASSER MRN: [id number] DOB: [birth year] Today's Date: 05/16/2019   History of Present Illness  Quintavious DARRIAN GRZELAK is a 74 y.o. male with medical history significant of COPD, diabetes mellitus type 2, hypertension, hyperlipidemia, gout, depression, anxiety who presented to the ER with vomiting.  As per patient and family, his symptoms started about 4 weeks ago with epigastric burning, nausea and intermittent vomiting.  Patient was placed on oral pantoprazole as outpatient with no significant improvement in symptoms.  He has had persistent vomiting, decreased ability to tolerate p.o., and persistent reflux, burning sensation in throat and epigastric region.  Symptoms have worsened over the last several days.  He had significant loss of weight related to decreased intake.  Was scheduled to see GI as outpatient on 11th February however he continued to have symptoms and decided to come to the ER.  Has had no significant dysphagia but does report that liquids and food tend to come back from upper stomach and lower esophageal region.  Does not feel like any food is stuck in the lower esophagus.  Has had no fever, chills, no abdominal discomfort, no diarrhea.    Clinical Impression  Patient functioning near baseline for functional mobility and gait other than slightly labored slower than normal movement, no loss of balance, during transfers and ambulation in hallway and tolerated sitting up in chair after therapy.  Patient encouraged to ambulate with nursing staff as tolerated for LOS.  Plan:  Patient discharged from physical therapy to care of nursing for ambulation daily as tolerated for length of stay.     Follow Up Recommendations Home health PT;Supervision - Intermittent    Equipment Recommendations  None recommended by PT    Recommendations for Other Services       Precautions / Restrictions Precautions Precautions: None Restrictions Weight  Bearing Restrictions: No      Mobility  Bed Mobility Overal bed mobility: Modified Independent             General bed mobility comments: increased time  Transfers Overall transfer level: Modified independent Equipment used: Straight cane             General transfer comment: increased time  Ambulation/Gait Ambulation/Gait assistance: Modified independent (Device/Increase time) Gait Distance (Feet): 200 Feet Assistive device: Straight cane Gait Pattern/deviations: Step-through pattern;WFL(Within Functional Limits) Gait velocity: decreased   General Gait Details: slightly labored cadence without loss of balance  Stairs            Wheelchair Mobility    Modified Rankin (Stroke Patients Only)       Balance Overall balance assessment: No apparent balance deficits (not formally assessed)                                           Pertinent Vitals/Pain Pain Assessment: No/denies pain    Home Living Family/patient expects to be discharged to:: Private residence Living Arrangements: Spouse/significant other Available Help at Discharge: Family;Available 24 hours/day Type of Home: House Home Access: Ramped entrance     Home Layout: One level Home Equipment: Cane - single point Additional Comments: tripod cane    Prior Function Level of Independence: Independent with assistive device(s)         Comments: household and short distanced community ambulator with tripod cane, drives     Hand Dominance  Extremity/Trunk Assessment   Upper Extremity Assessment Upper Extremity Assessment: Overall WFL for tasks assessed    Lower Extremity Assessment Lower Extremity Assessment: Overall WFL for tasks assessed    Cervical / Trunk Assessment Cervical / Trunk Assessment: Normal  Communication   Communication: No difficulties  Cognition Arousal/Alertness: Awake/alert Behavior During Therapy: WFL for tasks  assessed/performed Overall Cognitive Status: Within Functional Limits for tasks assessed                                        General Comments      Exercises     Assessment/Plan    PT Assessment All further PT needs can be met in the next venue of care  PT Problem List Decreased strength;Decreased activity tolerance;Decreased balance;Decreased mobility       PT Treatment Interventions      PT Goals (Current goals can be found in the Care Plan section)  Acute Rehab PT Goals Patient Stated Goal: return home with family to assist PT Goal Formulation: With patient Time For Goal Achievement: 05/16/19 Potential to Achieve Goals: Good    Frequency     Barriers to discharge        Co-evaluation               AM-PAC PT "6 Clicks" Mobility  Outcome Measure Help needed turning from your back to your side while in a flat bed without using bedrails?: None Help needed moving from lying on your back to sitting on the side of a flat bed without using bedrails?: None Help needed moving to and from a bed to a chair (including a wheelchair)?: None Help needed standing up from a chair using your arms (e.g., wheelchair or bedside chair)?: None Help needed to walk in hospital room?: A Little Help needed climbing 3-5 steps with a railing? : A Little 6 Click Score: 22    End of Session   Activity Tolerance: Patient tolerated treatment well Patient left: in chair;with call bell/phone within reach Nurse Communication: Mobility status PT Visit Diagnosis: Unsteadiness on feet (R26.81);Other abnormalities of gait and mobility (R26.89);Muscle weakness (generalized) (M62.81)    Time: 3220-2542 PT Time Calculation (min) (ACUTE ONLY): 25 min   Charges:   PT Evaluation $PT Eval Moderate Complexity: 1 Mod PT Treatments $Therapeutic Activity: 23-37 mins        9:47 AM, 05/16/19 [name], MPT Physical Therapist with [facility] [address]  [phone] office [phone] mobile phone

## 2019-05-16 NOTE — Progress Notes (Signed)
Oncology Navigator Note:  Patient was referred to our office following an inpatient stay. I called patient today to introduce myself and provide information in how I will be involved with their care.  I talked with his wife, as he was resting since discharge earlier today.  I provided information on his first visit and what to expect.  I made sure patient was aware of appointment time and directions to the cancer center.  My phone number was given so that they can call me with any questions or concerns.  His wife, Kurt Duran, voices appreciation and understanding.   Orders received for PET scan ASAP. I have this scheduled for 2/17 @ 3:30 at [facility].  Patient is to be NPO except water 6 hours prior. He is to hold his morning dose of metformin and glipizide.  He needs to avoid heavy carb the day of scan and night before.  His wife, Kurt Duran, voices understanding and read back to me the instructions.

## 2019-05-16 NOTE — TOC Transition Note (Signed)
Transition of Care [facility]) - CM/SW Discharge Note   Patient Details  Name: ALANN SLYMAN MRN: [id number] Date of Birth: January 08, 1946  Transition of Care [facility]) CM/SW Contact:  Boneta Lucks, RN Phone Number: 05/16/2019, 12:51 PM   Clinical Narrative:   Patient ready to discharge home. Patient lives with wife- Jana Half.  PT recommending HHPT.  Patient will also need RN for tube feedings.  Spoke with Jana Half she is very nervous, discussed referrals and company choices.  Call Pam with [facility] and Vaughan Basta with [facility].  Patient is medically ready today and they can do a home set up today.  RN and MD updated.    Final next level of care: [place] Barriers to Discharge: Barriers Resolved   Patient Goals and CMS Choice Patient states their goals for this hospitalization and ongoing recovery are:: to go home. CMS Medicare.gov Compare Post Acute Care list provided to:: Patient Represenative (must comment) Choice offered to / list presented to : Spouse  Discharge Placement             Patient to be transferred to facility by: wife Name of family member notified: Jana Half - wife Patient and family notified of of transfer: 05/16/19  Discharge Plan and Services                DME Arranged: Tube feeding DME Agency: Other - Comment([facility]) Date DME Agency Contacted: 05/16/19 Time DME Agency Contacted: 1100 Representative spoke with at DME Agency: [place]: RN, PT [place] Agency: [place] ([place]) Date [place] Log: 05/16/19 Time [place]: [id number] Representative spoke with at [place]: Romualdo Bolk

## 2019-05-16 NOTE — Progress Notes (Signed)
Nsg Discharge Note  Admit Date:  05/09/2019 Discharge date: 05/16/2019   Chrissie Noa to be D/C'dhome per MD order.  AVS completed.  Copy for chart, and copy for patient signed, and dated. Patient and wife, Jana Half able to verbalize understanding. Explained that [facility] nurse would call and make available to meet her to the house.   Discharge Medication: Allergies as of 05/16/2019      Reactions   Eggs Or Egg-derived Products Nausea And Vomiting      Medication List    STOP taking these medications   Calcium-Magnesium-Zinc Tabs   Farxiga 10 MG Tabs tablet Generic drug: dapagliflozin propanediol   Fish Oil 1000 MG Caps   gabapentin 100 MG capsule Commonly known as: NEURONTIN   glipiZIDE 10 MG 24 hr tablet Commonly known as: GLUCOTROL XL Replaced by: glipiZIDE 10 MG tablet   pantoprazole 40 MG tablet Commonly known as: PROTONIX Replaced by: pantoprazole sodium 40 mg/20 mL Pack     TAKE these medications   acetaminophen 325 MG tablet Commonly known as: TYLENOL Place 2 tablets (650 mg total) into feeding tube every 6 (six) hours as needed for mild pain (or Fever >/= 101).   albuterol 108 (90 Base) MCG/ACT inhaler Commonly known as: VENTOLIN HFA Inhale 2 puffs into the lungs every 4 (four) hours as needed for wheezing or shortness of breath.   albuterol (2.5 MG/3ML) 0.083% nebulizer solution Commonly known as: PROVENTIL Take 2.5 mg by nebulization every 6 (six) hours as needed for wheezing or shortness of breath.   ALPRAZolam 0.5 MG tablet Commonly known as: XANAX 1 tablet (0.5 mg total) by Per J Tube route 2 (two) times daily. What changed: how to take this   atorvastatin 40 MG tablet Commonly known as: LIPITOR 1 tablet (40 mg total) by Per J Tube route every evening. for cholesterol What changed: how to take this   famotidine 20 MG tablet Commonly known as: PEPCID Place 1 tablet (20 mg total) into feeding tube daily. What changed: how to take this   feeding  supplement (OSMOLITE 1.5 CAL) Liqd Place 1,000 mLs into feeding tube continuous.   feeding supplement (PRO-STAT SUGAR FREE 64) Liqd 30 mLs by Per J Tube route 2 (two) times daily.   FLUoxetine 20 MG capsule Commonly known as: PROZAC Place 1 capsule (20 mg total) into feeding tube daily. What changed: how to take this   free water Soln Place 300 mLs into feeding tube every 8 (eight) hours.   glipiZIDE 10 MG tablet Commonly known as: GLUCOTROL Place 1 tablet (10 mg total) into feeding tube 2 (two) times daily before a meal. Replaces: glipiZIDE 10 MG 24 hr tablet   metFORMIN 1000 MG tablet Commonly known as: GLUCOPHAGE 1 tablet (1,000 mg total) by Per J Tube route 2 (two) times daily. What changed: how to take this   pantoprazole sodium 40 mg/20 mL Pack Commonly known as: PROTONIX Place 20 mLs (40 mg total) into feeding tube daily. Replaces: pantoprazole 40 MG tablet   terazosin 10 MG capsule Commonly known as: HYTRIN 1 capsule (10 mg total) by Per J Tube route daily. What changed: how to take this            Durable Medical Equipment  (From admission, onward)         Start     Ordered   05/16/19 1231  For home use only DME Tube feeding pump  Once    Comments: Will need greater than 90  days  Continuous: Osmolite 1.5 @ 65 ml/hr via J-tube.   Discontinue ProStat 30 mo BID  Free water flushes 300 ml TID via tube.     Question:  Length of Need  Answer:  6 Months   05/16/19 1232          Discharge Assessment: Vitals:   05/16/19 1005 05/16/19 1257  BP:  124/76  Pulse:  94  Resp:  19  Temp:  98.4 F (36.9 C)  SpO2: 99% 97%   Skin clean, dry and intact without evidence of skin break down, no evidence of skin tears noted. IV catheter discontinued intact. Site without signs and symptoms of complications - no redness or edema noted at insertion site, patient denies c/o pain - only slight tenderness at site.  Dressing with slight pressure applied.  D/c  Instructions-Education: Discharge instructions given to patient/family with verbalized understanding. D/c education completed with patient/family including follow up instructions, medication list, d/c activities limitations if indicated, with other d/c instructions as indicated by MD - patient able to verbalize understanding, all questions fully answered. Patient instructed to return to ED, call 911, or call MD for any changes in condition.  Patient escorted via [place], and D/C home via private auto.  Venita Sheffield, RN 05/16/2019 2:40 PM

## 2019-05-16 NOTE — Progress Notes (Signed)
Nutrition Follow-up   INTERVENTION:  Continuous: Osmolite 1.5 @ 65 ml/hr via J-tube.   Discontinue ProStat 30 mo BID  Free water flushes 300 ml TID via tube.    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  GOAL:   Patient will meet greater than or equal to 90% of their needs   MONITOR:   Weight trends, Labs, I & O's, TF tolerance  REASON FOR ASSESSMENT:   Consult Enteral/tube feeding initiation and management  ASSESSMENT:   74 year old male with past medical history of COPD, DM2, diabetic retinopathy, hypertensive retinopathy, HTN, HLD, GERD, gout, depression and anxiety who presented to ED with vomiting. Patient reports 4 week history of epigastric burning, nausea with intermittent vomiting and placed on oral pantoprazole as outpatient with no significant improvement in symptoms. Patient has had persistent vomiting,  decreased ability to tolerate oral intake, significant wt loss, as well as burning in throat and epigastric region. CT of chest, abdomen, pelvis shows dilation of esophagus with small hiatal hernia, pt admitted for intractable vomiting possibly secondary to esophagitis with concerns for food impaction.   2/8 Patient is discharged home on continuous tube feeds as noted above. Talked with case management and Home Health services representative. Wife will be assisting with managing at home. Tolerating at 60 ml/hr will increase to 65 ml/hr and discontinue protein modular. Provides 2340 kcal, 98 gr protein and 1188 ml water. Free water flushes add 900 ml daily to meet est fluid needs.  Intake/Output Summary (Last 24 hours) at 05/16/2019 1220 Last data filed  at 05/16/2019 0300 Gross per 24 hour  Intake 3356.8 ml  Output --  Net 3356.8 ml     BMP Latest Ref Rng & Units 05/16/2019 05/15/2019 05/14/2019  Glucose 70 - 99 mg/dL 278(H) 283(H) 222(H)  BUN 8 - 23 mg/dL 15 11 11   Creatinine 0.61 - 1.24 mg/dL 0.64 0.75 0.79  Sodium 135 - 145 mmol/L 141 139 137  Potassium 3.5 - 5.1 mmol/L 3.3(L) 3.5 3.1(L)  Chloride 98 - 111 mmol/L 104 102 103  CO2 22 - 32 mmol/L 27 26 25   Calcium 8.9 - 10.3 mg/dL 8.0(L) 8.1(L) 8.1(L)   Diet Order:   Diet Order    None      EDUCATION NEEDS:   Education needs have been addressed  Skin:  Skin Assessment: Reviewed RN Assessment  Last BM:  1/28  Height:   Ht Readings from Last 1 Encounters:  05/09/19 6\' 1"  (1.854 m)    Weight:   Wt Readings from Last 1 Encounters:  05/16/19 91.7 kg    Ideal Body Weight:  83.6 kg  BMI:  Body mass index is 26.67 kg/m.  Estimated Nutritional Needs:   Kcal:  2150-2300 (MSJ x 1.3-1.4)  Protein:  99-109 gr  Fluid:  >/= 2 L/day  Jeani Hawking RD Contact: via Shea [place]

## 2019-05-16 NOTE — Anesthesia Postprocedure Evaluation (Addendum)
Anesthesia Post Note  Patient: Kurt Duran  Procedure(s) Performed: JEJUNOSTOMY FEEDING TUBE PLACEMENT (Procedure #2) (N/A Abdomen) INSERTION PORT-A-CATH (attached catheter in right subclavian) (Procedure #1) (Right Chest)  Patient location during evaluation: PACU Anesthesia Type: General Level of consciousness: awake and alert and oriented Pain management: pain level controlled Vital Signs Assessment: post-procedure vital signs reviewed and stable Respiratory status: spontaneous breathing Cardiovascular status: blood pressure returned to baseline and stable Postop Assessment: no apparent nausea or vomiting Anesthetic complications: no Comments: Late entry, patient seen postoperatively on Friday, tolerated procedure well.     Last Vitals:  Vitals:   05/16/19 0958 05/16/19 1005  BP:    Pulse:    Resp:    Temp:    SpO2: 100% 99%    Last Pain:  Vitals:   05/16/19 1005  TempSrc:   PainSc: 0-No pain                 Edie Darley

## 2019-05-17 ENCOUNTER — Encounter (HOSPITAL_COMMUNITY): Payer: Self-pay | Admitting: *Deleted

## 2019-05-17 DIAGNOSIS — C159 Malignant neoplasm of esophagus, unspecified: Secondary | ICD-10-CM | POA: Diagnosis not present

## 2019-05-17 DIAGNOSIS — Z483 Aftercare following surgery for neoplasm: Secondary | ICD-10-CM | POA: Diagnosis not present

## 2019-05-17 DIAGNOSIS — I10 Essential (primary) hypertension: Secondary | ICD-10-CM | POA: Diagnosis not present

## 2019-05-17 DIAGNOSIS — Z452 Encounter for adjustment and management of vascular access device: Secondary | ICD-10-CM | POA: Diagnosis not present

## 2019-05-17 DIAGNOSIS — Z934 Other artificial openings of gastrointestinal tract status: Secondary | ICD-10-CM | POA: Diagnosis not present

## 2019-05-17 DIAGNOSIS — J449 Chronic obstructive pulmonary disease, unspecified: Secondary | ICD-10-CM | POA: Diagnosis not present

## 2019-05-17 NOTE — Progress Notes (Signed)
I spoke with patient's wife this morning and gave updated appointment information. She read everything back to me and verbalizes understanding.

## 2019-05-18 DIAGNOSIS — Z934 Other artificial openings of gastrointestinal tract status: Secondary | ICD-10-CM | POA: Diagnosis not present

## 2019-05-18 DIAGNOSIS — E1142 Type 2 diabetes mellitus with diabetic polyneuropathy: Secondary | ICD-10-CM | POA: Diagnosis not present

## 2019-05-18 DIAGNOSIS — I10 Essential (primary) hypertension: Secondary | ICD-10-CM | POA: Diagnosis not present

## 2019-05-18 DIAGNOSIS — E1165 Type 2 diabetes mellitus with hyperglycemia: Secondary | ICD-10-CM | POA: Diagnosis not present

## 2019-05-18 DIAGNOSIS — Z6826 Body mass index (BMI) 26.0-26.9, adult: Secondary | ICD-10-CM | POA: Diagnosis not present

## 2019-05-18 DIAGNOSIS — C159 Malignant neoplasm of esophagus, unspecified: Secondary | ICD-10-CM | POA: Diagnosis not present

## 2019-05-18 DIAGNOSIS — Z299 Encounter for prophylactic measures, unspecified: Secondary | ICD-10-CM | POA: Diagnosis not present

## 2019-05-19 ENCOUNTER — Ambulatory Visit: Payer: Medicare Other | Admitting: Nurse Practitioner

## 2019-05-19 DIAGNOSIS — C159 Malignant neoplasm of esophagus, unspecified: Secondary | ICD-10-CM | POA: Diagnosis not present

## 2019-05-19 DIAGNOSIS — Z934 Other artificial openings of gastrointestinal tract status: Secondary | ICD-10-CM | POA: Diagnosis not present

## 2019-05-19 DIAGNOSIS — Z452 Encounter for adjustment and management of vascular access device: Secondary | ICD-10-CM | POA: Diagnosis not present

## 2019-05-19 DIAGNOSIS — J449 Chronic obstructive pulmonary disease, unspecified: Secondary | ICD-10-CM | POA: Diagnosis not present

## 2019-05-19 DIAGNOSIS — Z483 Aftercare following surgery for neoplasm: Secondary | ICD-10-CM | POA: Diagnosis not present

## 2019-05-19 DIAGNOSIS — I10 Essential (primary) hypertension: Secondary | ICD-10-CM | POA: Diagnosis not present

## 2019-05-22 NOTE — Discharge Summary (Signed)
Kurt Duran, is a 74 y.o. male  DOB Dec 25, 1945  MRN [id number].  Admission date:  05/09/2019  Admitting Physician  Roxan Hockey, MD  Discharge Date:  05/22/2019   Primary MD  Glenda Chroman, MD  Recommendations for primary care physician for things to follow:  -Please check CBC, CMP during next visit. -Please adjust diabetic regimen if indicated, he is currently on continuous tube feed. -Patient to follow with oncology as an outpatient regarding PET scan, and CT surgery referral.   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 Anxiety   . COPD (chronic obstructive pulmonary disease) ([place])   . Diabetes mellitus without complication ([place])   . Diabetic retinopathy ([place])    NPDR OU  . GERD (gastroesophageal reflux disease)   . Gout   . Hypertension   . Hypertensive retinopathy    OU    Past Surgical History:  Procedure Laterality Date  . BIOPSY  05/10/2019   Procedure: BIOPSY;  Surgeon: Danie Binder, MD;  [facility];  Service: Endoscopy;;  gastric esophageal  . CATARACT EXTRACTION W/PHACO Left 04/03/2016   Procedure: CATARACT EXTRACTION PHACO AND INTRAOCULAR LENS PLACEMENT LEFT EYE CDE= 11.33;  Surgeon: Tonny Branch, MD;  Location: [facility];  Service: Ophthalmology;  Laterality: Left;   left  . CATARACT EXTRACTION W/PHACO Right 04/28/2016   Procedure: CATARACT EXTRACTION PHACO AND INTRAOCULAR LENS PLACEMENT (IOC);  Surgeon: Tonny Branch, MD;  Location: [facility];  Service: Ophthalmology;  Laterality: Right;  [id number]   . ESOPHAGOGASTRODUODENOSCOPY (EGD) WITH PROPOFOL N/A 05/10/2019   Procedure: ESOPHAGOGASTRODUODENOSCOPY (EGD) WITH PROPOFOL with dilation as appropriate.;  Surgeon: Danie Binder, MD;  [facility];  Service: Endoscopy;  Laterality: N/A;  . EYE SURGERY Bilateral    Cat Sx OU  . JEJUNOSTOMY N/A 05/13/2019   Procedure: JEJUNOSTOMY FEEDING TUBE PLACEMENT (Procedure #2);  Surgeon: Aviva Signs, MD;  Location: [facility];  Service: General;  Laterality: N/A;  . KNEE ARTHROSCOPY Right   . NECK SURGERY    . PORTACATH PLACEMENT Right 05/13/2019   Procedure: INSERTION PORT-A-CATH (attached catheter in right subclavian) (Procedure #1);  Surgeon: Aviva Signs, MD;  Location: [facility];  Service: General;  Laterality: Right;  . ROTATOR CUFF REPAIR Right        History of present illness and  Hospital Course:     Kindly see H&P for history of present illness and admission details, please review complete Labs, Consult reports and  Test reports for all details in brief  HPI  from the history and physical done on the day of admission 05/09/2019  HPI: Kurt Duran is a 74 y.o. male with medical history significant of COPD, diabetes mellitus type 2, hypertension, hyperlipidemia, gout, depression, anxiety who presented to the ER with vomiting.  As per patient and family, his symptoms started about 4 weeks ago with epigastric burning, nausea and intermittent vomiting.  Patient was placed on oral pantoprazole as outpatient with no significant improvement in symptoms.  He has had persistent vomiting, decreased ability to tolerate p.o., and persistent reflux, burning sensation in throat and epigastric region.  Symptoms have worsened over the last several days.  He had significant  loss of weight related to decreased intake.  Was scheduled to see GI as outpatient on 11th February however he continued to have symptoms and decided to come to the ER.  Has had no significant dysphagia but does report that liquids and food tend to come back from upper stomach and lower esophageal region.  Does not feel like any food is stuck in the lower esophagus.  Has had no fever, chills, no abdominal discomfort, no diarrhea. ED Course:  Vital Signs reviewed on presentation, significant for temperature 98.4, heart rate 83, blood pressure 155/82, saturation 99% on room air. Labs reviewed, significant for sodium 144, potassium 4.1, BUN 28, creatinine 1.03, LFTs within normal limits, troponin negative x2, WBC count 6.7, hemoglobin 15.7, hematocrit 48, platelets 170. Imaging personally Reviewed, CT of the chest, abdomen, pelvis shows dilatation of esophagus measuring 2.7 x 1.8 cm with a small hiatal hernia.  Multilevel degenerative changes throughout the thoracic spine. No other acute cardiac, pulmonary, intra-abdominal abnormality.  Diverticulosis of descending colon without evidence of diverticulitis. EKG personally reviewed, shows sinus rhythm, no acute ST-T changes.   Hospital Course   74 y.o.malewith medical history significant ofCOPD, diabetes mellitus type 2, hypertension, hyperlipidemia, gout, depression, anxiety admitted on 05/09/2019 with intractable emesis, EGD on 05/10/2019 revealed esophageal mass in the lower third, pathology consistent with adenocarcinoma.  Status post Port-A-Cath/surgical J-tube 2/5 by Dr. Arnoldo Morale.  Esophageal Mass/adenocarcinoma -EGD on 05/10/2019 with mass in the lower third of the esophagus consistent with adenocarcinoma -Oncology on board, Dr. Delton Coombes  input greatly appreciated, plan for PET scan as an outpatient, likely will need chemotherapy, he is arranging for outpatient referral to CT surgery in [facility] -Status post EGD by  Dr. Oneida Alar -Patient went  for  open J-tube procedure/Port-A-Cath earlier this morning/ -Chemo/radiation and then possibly down the road resection/surgery of the esophageal mass  COPD - no acute exacerbation at this time, continue bronchodilators  DM2-    -Given poor oral intake, initially holding  Metformin and glipizide, he did require CBG during hospital stay, now he is on tube tube, CBG started to increase, will resume on his home glipizide and Metformin.   Depression/Anxiety -Resume home medication on discharge  Hypokalemia/hypophosphatemia -High risk for refeeding syndrome,  letter closely, required supplementation, stable at time of discharge.   Severe protein calorie malnutrition -This is in the context of poor oral intake from esophageal malignancy. -Nutritionist input greatly appreciated, patient has been started on tube feeds via J-tube, home health arranged.   Discharge Condition:  [place]   Follow UP  Follow-up Information    Aviva Signs, MD. Schedule an appointment as soon as possible for a visit on 05/31/2019.   Specialty: General Surgery Contact information: [address] [id number] [phone]  Derek Jack, MD.   Specialty: Hematology Contact information: [place] [address] [phone]        [facility] Follow up.   Specialty: Home Health Services Why: PT and RN       [facility] Follow [facility].   Why:  Providing your tube feedings Contact information: [phone]            Discharge Instructions  and  Discharge Medications    Discharge Instructions    Discharge instructions   Complete by: As directed    Follow with Primary MD Glenda Chroman, MD in 7 days   Get CBC, CMP,  checked  by Primary MD next visit.    Activity: As tolerated with Full fall precautions use walker/cane & assistance as needed   Disposition Home    Diet: Tube feed  On your next visit with your  primary care physician please Get Medicines reviewed and adjusted.   Please request your Prim.MD to go over all Hospital Tests and Procedure/Radiological results at the follow up, please get all Hospital records sent to your Prim MD by signing hospital release before you go home.   If you experience worsening of your admission symptoms, develop shortness of breath, life threatening emergency, suicidal or homicidal thoughts you must seek medical attention immediately by calling 911 or calling your MD immediately  if symptoms less severe.  You Must read complete instructions/literature along with all the possible adverse reactions/side effects for all the Medicines you take and that have been prescribed to you. Take any new Medicines after you have completely understood and accpet all the possible adverse reactions/side effects.   Do not drive, operating heavy machinery, perform activities at heights, swimming or participation in water activities or provide baby sitting services if your were admitted for syncope or siezures until you have seen by Primary MD or a Neurologist and advised to do so again.  Do not drive when taking Pain medications.    Do not take more than prescribed Pain, Sleep and Anxiety Medications  Special Instructions: If you have smoked or chewed Tobacco  in the last 2 yrs please stop smoking, stop any regular Alcohol  and or any Recreational drug use.  Wear Seat belts while driving.   Please note  You were cared for by a hospitalist during your hospital stay. If you have any questions about your discharge medications or the care you received while you were in the hospital after you are discharged, you can call the unit and asked to speak with the hospitalist on call if the hospitalist that took care of you is not available. Once you are discharged, your primary care physician will handle any further medical issues. Please note that NO REFILLS for any discharge medications  will be authorized once you are discharged, as it is imperative that you return to your primary care physician (or establish a relationship with a primary care physician if you do not have one) for your aftercare needs so that they can reassess your need for medications and monitor your lab values.   Increase activity slowly   Complete by: As directed      Allergies as of 05/16/2019      Reactions   Eggs Or Egg-derived Products Nausea And Vomiting      Medication List    STOP taking these medications   Calcium-Magnesium-Zinc Tabs   Farxiga 10 MG Tabs tablet Generic drug: dapagliflozin propanediol   Fish  Oil 1000 MG Caps   gabapentin 100 MG capsule Commonly known as: NEURONTIN   glipiZIDE 10 MG 24 hr tablet Commonly known as: GLUCOTROL XL Replaced by: glipiZIDE 10 MG tablet   pantoprazole 40 MG tablet Commonly known as: PROTONIX Replaced by: pantoprazole sodium 40 mg/20 mL Pack     TAKE these medications   acetaminophen 325 MG tablet Commonly known as: TYLENOL Place 2 tablets (650 mg total) into feeding tube every 6 (six) hours as needed for mild pain (or Fever >/= 101).   albuterol 108 (90 Base) MCG/ACT inhaler Commonly known as: VENTOLIN HFA Inhale 2 puffs into the lungs every 4 (four) hours as needed for wheezing or shortness of breath.   albuterol (2.5 MG/3ML) 0.083% nebulizer solution Commonly known as: PROVENTIL Take 2.5 mg by nebulization every 6 (six) hours as needed for wheezing or shortness of breath.   ALPRAZolam 0.5 MG tablet Commonly known as: XANAX 1 tablet (0.5 mg total) by Per J Tube route 2 (two) times daily. What changed: how to take this   atorvastatin 40 MG tablet Commonly known as: LIPITOR 1 tablet (40 mg total) by Per J Tube route every evening. for cholesterol What changed: how to take this   famotidine 20 MG tablet Commonly known as: PEPCID Place 1 tablet (20 mg total) into feeding tube daily. What changed: how to take this   feeding  supplement (OSMOLITE 1.5 CAL) Liqd Place 1,000 mLs into feeding tube continuous.   feeding supplement (PRO-STAT SUGAR FREE 64) Liqd 30 mLs by Per J Tube route 2 (two) times daily.   FLUoxetine 20 MG capsule Commonly known as: PROZAC Place 1 capsule (20 mg total) into feeding tube daily. What changed: how to take this   free water Soln Place 300 mLs into feeding tube every 8 (eight) hours.   glipiZIDE 10 MG tablet Commonly known as: GLUCOTROL Place 1 tablet (10 mg total) into feeding tube 2 (two) times daily before a meal. Replaces: glipiZIDE 10 MG 24 hr tablet   metFORMIN 1000 MG tablet Commonly known as: GLUCOPHAGE 1 tablet (1,000 mg total) by Per J Tube route 2 (two) times daily. What changed: how to take this   pantoprazole sodium 40 mg/20 mL Pack Commonly known as: PROTONIX Place 20 mLs (40 mg total) into feeding tube daily. Replaces: pantoprazole 40 MG tablet   terazosin 10 MG capsule Commonly known as: HYTRIN 1 capsule (10 mg total) by Per J Tube route daily. What changed: how to take this         Diet and Activity recommendation: See Discharge Instructions above   Consults obtained -  Gi/Oncology/gen surgery    Major procedures and Radiology Reports - PLEASE review detailed and final reports for all details, in brief -   -EGD on 05/10/2019 revealed esophageal mass in the lower third, pathology consistent with adenocarcinoma jejunostomy tube placement, portacath placement on 05/13/19 - Procedure:Feeding jejunostomy tube placement, Port-A-Cath placement by Aviva Signs, MD 05/13/2019   CT Chest W Contrast  Result Date: 05/09/2019 CLINICAL DATA:  Nausea and vomiting. EXAM: CT CHEST, ABDOMEN, AND PELVIS WITH CONTRAST TECHNIQUE: Multidetector CT imaging of the chest, abdomen and pelvis was performed following the standard protocol during bolus administration of intravenous contrast. CONTRAST:  130mL OMNIPAQUE IOHEXOL 300 MG/ML  SOLN COMPARISON:  Abdomen pelvis CT,  dated January 14, 2007, is available for comparison. FINDINGS: CT CHEST FINDINGS Cardiovascular: There is moderate severity calcification of the thoracic aorta. Normal heart size. No pericardial effusion. Marked  severity coronary artery calcification is seen. Mediastinum/Nodes: No enlarged mediastinal, hilar, or axillary lymph nodes. Small cystic structures are seen within both lobes of the thyroid gland. Dilatation of the esophagus is seen which measures approximately 2.7 cm x 1.8 cm. A small hiatal hernia is noted. Lungs/Pleura: Lungs are clear. No pleural effusion or pneumothorax. Musculoskeletal: Multilevel degenerative changes seen throughout the thoracic spine. CT ABDOMEN PELVIS FINDINGS Hepatobiliary: No focal liver abnormality is seen. No gallstones, gallbladder wall thickening, or biliary dilatation. Pancreas: Unremarkable. No pancreatic ductal dilatation or surrounding inflammatory changes. Spleen: Normal in size without focal abnormality. Adrenals/Urinary Tract: The right adrenal gland is normal in appearance. 0.6 cm 1.0 cm fat density left adrenal masses are seen. Kidneys are normal, without renal calculi or hydronephrosis. A 5 mm cyst is seen within the posterolateral aspect of the mid right kidney. Bladder is unremarkable. Stomach/Bowel: Stomach is within normal limits. Appendix appears normal. No evidence of bowel wall thickening, distention, or inflammatory changes. Noninflamed diverticula is seen throughout the descending colon. Vascular/Lymphatic: Moderate to marked severity aortic atherosclerosis. No enlarged abdominal or pelvic lymph nodes. Reproductive: Prostate is unremarkable. Other: No abdominal wall hernia or abnormality. No abdominopelvic ascites. Musculoskeletal: A predominant stable 9 mm sclerotic focus is seen within the posterior aspect of the sacrum on the left. Degenerative changes seen throughout the lumbar spine. IMPRESSION: 1. No evidence of acute or active cardiopulmonary disease.  2. Small hiatal hernia. 3. Small left adrenal myelolipomas. 4. Noninflamed diverticula of the descending colon. Electronically Signed   By: Virgina Norfolk M.D.   On: 05/09/2019 22:07   CT ABDOMEN PELVIS W CONTRAST  Result Date: 05/09/2019 CLINICAL DATA:  Nausea and vomiting. EXAM: CT CHEST, ABDOMEN, AND PELVIS WITH CONTRAST TECHNIQUE: Multidetector CT imaging of the chest, abdomen and pelvis was performed following the standard protocol during bolus administration of intravenous contrast. CONTRAST:  152mL OMNIPAQUE IOHEXOL 300 MG/ML  SOLN COMPARISON:  Abdomen pelvis CT, dated January 14, 2007, is available for comparison. FINDINGS: CT CHEST FINDINGS Cardiovascular: There is moderate severity calcification of the thoracic aorta. Normal heart size. No pericardial effusion. Marked severity coronary artery calcification is seen. Mediastinum/Nodes: No enlarged mediastinal, hilar, or axillary lymph nodes. Small cystic structures are seen within both lobes of the thyroid gland. Dilatation of the esophagus is seen which measures approximately 2.7 cm x 1.8 cm. A small hiatal hernia is noted. Lungs/Pleura: Lungs are clear. No pleural effusion or pneumothorax. Musculoskeletal: Multilevel degenerative changes seen throughout the thoracic spine. CT ABDOMEN PELVIS FINDINGS Hepatobiliary: No focal liver abnormality is seen. No gallstones, gallbladder wall thickening, or biliary dilatation. Pancreas: Unremarkable. No pancreatic ductal dilatation or surrounding inflammatory changes. Spleen: Normal in size without focal abnormality. Adrenals/Urinary Tract: The right adrenal gland is normal in appearance. 0.6 cm 1.0 cm fat density left adrenal masses are seen. Kidneys are normal, without renal calculi or hydronephrosis. A 5 mm cyst is seen within the posterolateral aspect of the mid right kidney. Bladder is unremarkable. Stomach/Bowel: Stomach is within normal limits. Appendix appears normal. No evidence of bowel wall thickening,  distention, or inflammatory changes. Noninflamed diverticula is seen throughout the descending colon. Vascular/Lymphatic: Moderate to marked severity aortic atherosclerosis. No enlarged abdominal or pelvic lymph nodes. Reproductive: Prostate is unremarkable. Other: No abdominal wall hernia or abnormality. No abdominopelvic ascites. Musculoskeletal: A predominant stable 9 mm sclerotic focus is seen within the posterior aspect of the sacrum on the left. Degenerative changes seen throughout the lumbar spine. IMPRESSION: 1. No evidence of acute or active  cardiopulmonary disease. 2. Small hiatal hernia. 3. Small left adrenal myelolipomas. 4. Noninflamed diverticula of the descending colon. Electronically Signed   By: Virgina Norfolk M.D.   On: 05/09/2019 22:07   DG Chest Port 1 View  Result Date: 05/13/2019 CLINICAL DATA:  Status post Port-A-Cath placement today. EXAM: PORTABLE CHEST 1 VIEW COMPARISON:  Single-view of the chest 12/17/2017. FINDINGS: Right subclavian approach Port-A-Cath is in place with the tip in the mid to lower superior vena cava. Tubing is intact. No pneumothorax. Lungs clear. Heart size normal. Atherosclerosis noted. The patient is status post right shoulder surgery. IMPRESSION: Tip of right subclavian approach Port-A-Cath projects in the mid to lower superior vena cava. No pneumothorax. No acute disease. Atherosclerosis. Electronically Signed   By: Inge Rise M.D.   On: 05/13/2019 09:42   DG C-Arm 1-60 Min-No Report  Result Date: 05/13/2019 Fluoroscopy was utilized by the requesting physician.  No radiographic interpretation.   ECHOCARDIOGRAM COMPLETE  Result Date: 05/10/2019   ECHOCARDIOGRAM REPORT   Patient Name:   Kurt Duran Date of Exam: 05/10/2019 Medical Rec #:  [id number]       Height:       73.0 in Accession #:    [id number]      Weight:       183.6 lb Date of Birth:  September 27, 1945       BSA:          2.08 m Patient Age:    60 years        BP:           161/84 mmHg Patient  Gender: M               HR:           81 bpm. Exam Location:  [facility] Procedure: 2D Echo, Cardiac Doppler and Color Doppler Indications:    Dyspnea 786.09 / R06.00  History:        Patient has no prior history of Echocardiogram examinations.                 COPD; Risk Factors:Hypertension and Diabetes. GERD.  Sonographer:    Alvino Chapel RCS Referring Phys: [phone] COURAGE EMOKPAE IMPRESSIONS  1. Left ventricular ejection fraction, by visual estimation, is 60 to 65%. The left ventricle has normal function. There is mildly increased left ventricular hypertrophy.  2. Left ventricular diastolic parameters are consistent with Grade I diastolic dysfunction (impaired relaxation).  3. The left ventricle has no regional wall motion abnormalities.  4. Global right ventricle has normal systolic function.The right ventricular size is normal. No increase in right ventricular wall thickness.  5. Left atrial size was normal.  6. Right atrial size was normal.  7. Mild mitral annular calcification.  8. The mitral valve is grossly normal. Trivial mitral valve regurgitation.  9. The tricuspid valve is grossly normal. 10. The tricuspid valve is grossly normal. Tricuspid valve regurgitation is trivial. 11. The aortic valve is tricuspid. Aortic valve regurgitation is not visualized. Mild aortic valve stenosis. 12. The pulmonic valve was not well visualized. Pulmonic valve regurgitation is not visualized. 13. The inferior vena cava is normal in size with greater than 50% respiratory variability, suggesting right atrial pressure of 3 mmHg. FINDINGS  Left Ventricle: Left ventricular ejection fraction, by visual estimation, is 60 to 65%. The left ventricle has normal function. The left ventricle has no regional wall motion abnormalities. The left ventricular internal cavity size was the left ventricle is normal in  size. There is mildly increased left ventricular hypertrophy. Concentric left ventricular hypertrophy. Left ventricular  diastolic parameters are consistent with Grade I diastolic dysfunction (impaired relaxation). Indeterminate filling pressures. Right Ventricle: The right ventricular size is normal. No increase in right ventricular wall thickness. Global RV systolic function is has normal systolic function. Left Atrium: Left atrial size was normal in size. Right Atrium: Right atrial size was normal in size Pericardium: There is no evidence of pericardial effusion. Mitral Valve: The mitral valve is grossly normal. Mild mitral annular calcification. Trivial mitral valve regurgitation. Tricuspid Valve: The tricuspid valve is grossly normal. Tricuspid valve regurgitation is trivial. Aortic Valve: The aortic valve is tricuspid. . There is mild thickening of the aortic valve. Aortic valve regurgitation is not visualized. Mild aortic stenosis is present. Mild aortic valve annular calcification. There is mild thickening of the aortic valve. Aortic valve mean gradient measures 8.0 mmHg. Aortic valve peak gradient measures 16.6 mmHg. Aortic valve area, by VTI measures 1.79 cm. Pulmonic Valve: The pulmonic valve was not well visualized. Pulmonic valve regurgitation is not visualized. Pulmonic regurgitation is not visualized. Aorta: The aortic root is normal in size and structure. Venous: The inferior vena cava is normal in size with greater than 50% respiratory variability, suggesting right atrial pressure of 3 mmHg. IAS/Shunts: No atrial level shunt detected by color flow Doppler.  LEFT VENTRICLE PLAX 2D LVIDd:         4.67 cm  Diastology LVIDs:         2.51 cm  LV e' lateral:   7.83 cm/s LV PW:         1.02 cm  LV E/e' lateral: 12.9 LV IVS:        1.11 cm  LV e' medial:    7.29 cm/s LVOT diam:     2.00 cm  LV E/e' medial:  13.9 LV SV:         78 ml LV SV Index:   37.74 LVOT Area:     3.14 cm  RIGHT VENTRICLE RV S prime:     17.80 cm/s TAPSE (M-mode): 2.3 cm LEFT ATRIUM           Index       RIGHT ATRIUM           Index LA diam:      3.30  cm 1.59 cm/m  RA Area:     20.50 cm LA Vol (A2C): 49.3 ml 23.76 ml/m RA Volume:   56.70 ml  27.33 ml/m LA Vol (A4C): 47.6 ml 22.94 ml/m  AORTIC VALVE AV Area (Vmax):    1.68 cm AV Area (Vmean):   1.85 cm AV Area (VTI):     1.79 cm AV Vmax:           204.00 cm/s AV Vmean:          131.000 cm/s AV VTI:            0.408 m AV Peak Grad:      16.6 mmHg AV Mean Grad:      8.0 mmHg LVOT Vmax:         109.00 cm/s LVOT Vmean:        77.100 cm/s LVOT VTI:          0.232 m LVOT/AV VTI ratio: 0.57  AORTA Ao Root diam: 3.40 cm MITRAL VALVE MV Area (PHT): 2.91 cm              SHUNTS MV PHT:  75.69 msec            Systemic VTI:  0.23 m MV Decel Time: 261 msec              Systemic Diam: 2.00 cm MV E velocity: 101.00 cm/s 103 cm/s MV A velocity: 110.00 cm/s 70.3 cm/s MV E/A ratio:  0.92        1.5  Kate Sable MD Electronically signed by Kate Sable MD Signature Date/Time: 05/10/2019/3:35:48 PM    Final     Micro Results    Recent Results (from the past 240 hour(s))  Surgical PCR screen     Status: None   Collection Time: 05/12/19  4:38 PM   Specimen: Nasal Mucosa; Nasal Swab  Result Value Ref Range Status   MRSA, PCR NEGATIVE NEGATIVE Final   Staphylococcus aureus NEGATIVE NEGATIVE Final    Comment: (NOTE) The Xpert SA Assay (FDA approved for NASAL specimens in patients 67 years of age and older), is one component of a comprehensive surveillance program. It is not intended to diagnose infection nor to guide or monitor treatment. Performed at [facility], [address], [place], [place] [address]        Today   Subjective:   Kurt Duran today has no headache,no chest or abdominal pain,no new weakness tingling or numbness, feels much better wants to go home today.   Objective:   Blood pressure 124/76, pulse 94, temperature 98.4 F (36.9 C), resp. rate 19, height 6\' 1"  (1.854 m), weight 91.7 kg, SpO2 97 %.  No intake or output data in the 24 hours ending 05/22/19  1144  Exam Awake Alert, Oriented x 3, No new F.N deficits, Normal affect Symmetrical Chest wall movement, Good air movement bilaterally, CTAB, right upper Port-A-Cath site looks clean RRR,No Gallops,Rubs or new Murmurs, No Parasternal Heave +ve B.Sounds, Abd Soft, Non tender, G-tube in left abdomen, right midline surgical wound healing nicely No Cyanosis, Clubbing or edema, No new Rash or bruise  Data Review   CBC w Diff:  Lab Results  Component Value Date   WBC 5.3 05/16/2019   HGB 11.7 (L) 05/16/2019   HCT 35.5 (L) 05/16/2019   PLT 111 (L) 05/16/2019    CMP:  Lab Results  Component Value Date   NA 141 05/16/2019   K 3.3 (L) 05/16/2019   CL 104 05/16/2019   CO2 27 05/16/2019   BUN 15 05/16/2019   CREATININE 0.64 05/16/2019   PROT 6.8 05/11/2019   ALBUMIN 4.1 05/11/2019   BILITOT 0.9 05/11/2019   ALKPHOS 68 05/11/2019   AST 17 05/11/2019   ALT 18 05/11/2019  .   Total Time in preparing paper work, data evaluation and todays exam - 65 minutes  Phillips Climes M.D   Regional discharge summary done on 2/8 at 11:33 AM [facility]   Office  [phone]

## 2019-05-23 ENCOUNTER — Encounter (HOSPITAL_COMMUNITY)
Admission: RE | Admit: 2019-05-23 | Discharge: 2019-05-23 | Disposition: A | Discharge status: Home / Self Care | Payer: Medicare Other | Source: Ambulatory Visit | Attending: Hematology | Admitting: Hematology

## 2019-05-23 ENCOUNTER — Other Ambulatory Visit: Payer: Self-pay

## 2019-05-23 DIAGNOSIS — Z483 Aftercare following surgery for neoplasm: Secondary | ICD-10-CM | POA: Diagnosis not present

## 2019-05-23 DIAGNOSIS — J449 Chronic obstructive pulmonary disease, unspecified: Secondary | ICD-10-CM | POA: Diagnosis not present

## 2019-05-23 DIAGNOSIS — C159 Malignant neoplasm of esophagus, unspecified: Secondary | ICD-10-CM | POA: Insufficient documentation

## 2019-05-23 DIAGNOSIS — Z452 Encounter for adjustment and management of vascular access device: Secondary | ICD-10-CM | POA: Diagnosis not present

## 2019-05-23 DIAGNOSIS — Z934 Other artificial openings of gastrointestinal tract status: Secondary | ICD-10-CM | POA: Diagnosis not present

## 2019-05-23 DIAGNOSIS — I10 Essential (primary) hypertension: Secondary | ICD-10-CM | POA: Diagnosis not present

## 2019-05-23 MED ORDER — FLUDEOXYGLUCOSE F - 18 (FDG) INJECTION
12.2100 | Freq: Once | INTRAVENOUS | Status: AC | PRN
  Administered 2019-05-23: 17:00:00 12.21 via INTRAVENOUS

## 2019-05-25 ENCOUNTER — Encounter (HOSPITAL_COMMUNITY): Payer: Self-pay | Admitting: Hematology

## 2019-05-25 ENCOUNTER — Other Ambulatory Visit: Payer: Self-pay

## 2019-05-25 ENCOUNTER — Encounter (HOSPITAL_COMMUNITY): Payer: Self-pay | Admitting: Surgery

## 2019-05-25 ENCOUNTER — Encounter (HOSPITAL_COMMUNITY): Payer: 59

## 2019-05-25 ENCOUNTER — Inpatient Hospital Stay (HOSPITAL_COMMUNITY): Payer: Medicare Other | Attending: Hematology | Admitting: Hematology

## 2019-05-25 DIAGNOSIS — C159 Malignant neoplasm of esophagus, unspecified: Secondary | ICD-10-CM | POA: Diagnosis not present

## 2019-05-25 DIAGNOSIS — G629 Polyneuropathy, unspecified: Secondary | ICD-10-CM | POA: Diagnosis not present

## 2019-05-25 DIAGNOSIS — E119 Type 2 diabetes mellitus without complications: Secondary | ICD-10-CM | POA: Diagnosis not present

## 2019-05-25 DIAGNOSIS — I10 Essential (primary) hypertension: Secondary | ICD-10-CM | POA: Diagnosis not present

## 2019-05-25 DIAGNOSIS — F419 Anxiety disorder, unspecified: Secondary | ICD-10-CM | POA: Insufficient documentation

## 2019-05-25 DIAGNOSIS — R634 Abnormal weight loss: Secondary | ICD-10-CM | POA: Diagnosis not present

## 2019-05-25 DIAGNOSIS — C160 Malignant neoplasm of cardia: Secondary | ICD-10-CM | POA: Insufficient documentation

## 2019-05-25 DIAGNOSIS — Z79899 Other long term (current) drug therapy: Secondary | ICD-10-CM | POA: Diagnosis not present

## 2019-05-25 DIAGNOSIS — Z7984 Long term (current) use of oral hypoglycemic drugs: Secondary | ICD-10-CM | POA: Insufficient documentation

## 2019-05-25 DIAGNOSIS — J449 Chronic obstructive pulmonary disease, unspecified: Secondary | ICD-10-CM | POA: Diagnosis not present

## 2019-05-25 DIAGNOSIS — Z87891 Personal history of nicotine dependence: Secondary | ICD-10-CM | POA: Insufficient documentation

## 2019-05-25 DIAGNOSIS — Z8249 Family history of ischemic heart disease and other diseases of the circulatory system: Secondary | ICD-10-CM | POA: Diagnosis not present

## 2019-05-25 NOTE — Progress Notes (Signed)
[place], [place] [address]   CLINIC:  Medical Oncology/Hematology  PCP:  Glenda Chroman, MD [place] [address] [phone]   REASON FOR VISIT:  Follow-up for GE junction adenocarcinoma.  CURRENT THERAPY: Under work-up.    INTERVAL HISTORY:  Mr. Ackers 74 y.o. male seen for follow-up of GE junction adenocarcinoma.  He was recently hospitalized with 41-month history of dysphagia.  He had acid reflux for a long time and was treated with medications.  He reported 30 pound weight loss in the last 2 to 3 months.  He had EGD done by Dr. Oneida Alar which showed distal esophageal ulcerated mass extending from 37-39 cm from incisors.  This is partially obstructing.  For his nutrition J-tube was also placed by Dr. Arnoldo Morale.  He quit smoking more than 40 years ago and smoked 2 pack/day for 8 years.  He uses cane for ambulation secondary to arthritis.  He is able to do all his ADLs and IADLs.  Reports energy levels of 50%.  No pain reported.  He does report loose bowel movement since tube feeds started.    REVIEW OF SYSTEMS:  Review of Systems  Respiratory: Positive for cough.   All other systems reviewed and are negative.    PAST MEDICAL/SURGICAL HISTORY:  Past Medical History:  Diagnosis Date  . Anxiety   . Arthritis   . COPD (chronic obstructive pulmonary disease) ([place])   . Diabetes mellitus without complication ([place])   . Diabetic retinopathy ([place])    NPDR OU  . Esophageal cancer ([place])   . GERD (gastroesophageal reflux disease)   . Gout   . Hypertension   . Hypertensive retinopathy    OU   Past Surgical History:  Procedure Laterality Date  . BIOPSY  05/10/2019   Procedure: BIOPSY;  Surgeon: Danie Binder, MD;  Location: [facility];  Service: Endoscopy;;  gastric esophageal  . CATARACT EXTRACTION W/PHACO Left 04/03/2016   Procedure: CATARACT EXTRACTION PHACO AND INTRAOCULAR LENS PLACEMENT LEFT EYE CDE= 11.33;  Surgeon: Tonny Branch, MD;  Location: [facility] ORS;  Service: Ophthalmology;  Laterality: Left;  left  . CATARACT EXTRACTION W/PHACO Right 04/28/2016   Procedure: CATARACT EXTRACTION PHACO AND INTRAOCULAR LENS PLACEMENT (IOC);  Surgeon: Tonny Branch, MD;  Location: [facility] ORS;  Service: Ophthalmology;  Laterality: Right;  [id number]   . ESOPHAGOGASTRODUODENOSCOPY (EGD) WITH PROPOFOL N/A 05/10/2019   Procedure: ESOPHAGOGASTRODUODENOSCOPY (EGD) WITH PROPOFOL with dilation as appropriate.;  Surgeon: Danie Binder, MD;  Location: [facility];  Service: Endoscopy;  Laterality: N/A;  . EYE SURGERY Bilateral    Cat Sx OU  . JEJUNOSTOMY N/A 05/13/2019   Procedure: JEJUNOSTOMY FEEDING TUBE PLACEMENT (Procedure #2);  Surgeon: Aviva Signs, MD;  Location: [facility] ORS;  Service: General;  Laterality: N/A;  . KNEE ARTHROSCOPY Right   . NECK SURGERY    . PORTACATH PLACEMENT Right 05/13/2019   Procedure: INSERTION PORT-A-CATH (attached catheter in right subclavian) (Procedure #1);  Surgeon: Aviva Signs, MD;  Location: [facility] ORS;  Service: General;  Laterality: Right;  . ROTATOR CUFF REPAIR Right      SOCIAL HISTORY:  Social History   Socioeconomic History  . Marital status: Married    Spouse name: Not on file  . Number of children: 0  . Years of education: Not on file  . Highest education level: Not on file  Occupational History  . Occupation: retired  Tobacco Use  . Smoking status: Former  Smoker    Types: Cigarettes    Quit date: 03/26/1989    Years since quitting: 30.1  . Smokeless tobacco: Never Used  Substance and Sexual Activity  . Alcohol use: No  . Drug use: No  . Sexual activity: Not Currently  Other Topics Concern  . Not on file  Social History Narrative  . Not on file   Social Determinants of Health   Financial Resource Strain: Low Risk   . Difficulty of Paying Living Expenses: Not hard at all  Food Insecurity: No Food Insecurity  . Worried About Charity fundraiser in the Last Year: Never true  . Ran Out of  Food in the Last Year: Never true  Transportation Needs: No Transportation Needs  . Lack of Transportation (Medical): No  . Lack of Transportation (Non-Medical): No  Physical Activity: Inactive  . Days of Exercise per Week: 0 days  . Minutes of Exercise per Session: 0 min  Stress: No Stress Concern Present  . Feeling of Stress : Only a little  Social Connections: Not Isolated  . Frequency of Communication with Friends and Family: Three times a week  . Frequency of Social Gatherings with Friends and Family: Three times a week  . Attends Religious Services: More than 4 times per year  . Active Member of Clubs or Organizations: No  . Attends Archivist Meetings: More than 4 times per year  . Marital Status: Married  Human resources officer Violence: Not At Risk  . Fear of Current or Ex-Partner: No  . Emotionally Abused: No  . Physically Abused: No  . Sexually Abused: No    FAMILY HISTORY:  Family History  Adopted: Yes  Problem Relation Age of Onset  . Hypertension Brother   . Kidney disease Brother     CURRENT MEDICATIONS:  Outpatient Encounter Medications as of 05/25/2019  Medication Sig  . acetaminophen (TYLENOL) 325 MG tablet Place 2 tablets (650 mg total) into feeding tube every 6 (six) hours as needed for mild pain (or Fever >/= 101).  Marland Kitchen albuterol (PROVENTIL HFA;VENTOLIN HFA) 108 (90 Base) MCG/ACT inhaler Inhale 2 puffs into the lungs every 4 (four) hours as needed for wheezing or shortness of breath.  Marland Kitchen albuterol (PROVENTIL) (2.5 MG/3ML) 0.083% nebulizer solution Take 2.5 mg by nebulization every 6 (six) hours as needed for wheezing or shortness of breath.  . ALPRAZolam (XANAX) 0.5 MG tablet 1 tablet (0.5 mg total) by Per J Tube route 2 (two) times daily.  . Amino Acids-Protein Hydrolys (FEEDING SUPPLEMENT, PRO-STAT SUGAR FREE 64,) LIQD 30 mLs by Per J Tube route 2 (two) times daily.  Marland Kitchen atorvastatin (LIPITOR) 40 MG tablet 1 tablet (40 mg total) by Per J Tube route every  evening. for cholesterol  . famotidine (PEPCID) 20 MG tablet Place 1 tablet (20 mg total) into feeding tube daily.  Marland Kitchen FLUoxetine (PROZAC) 20 MG capsule Place 1 capsule (20 mg total) into feeding tube daily.  Marland Kitchen glipiZIDE (GLUCOTROL) 10 MG tablet Place 1 tablet (10 mg total) into feeding tube 2 (two) times daily before a meal.  . metFORMIN (GLUCOPHAGE) 1000 MG tablet 1 tablet (1,000 mg total) by Per J Tube route 2 (two) times daily.  . Nutritional Supplements (FEEDING SUPPLEMENT, OSMOLITE 1.5 CAL,) LIQD Place 1,000 mLs into feeding tube continuous.  . pantoprazole sodium (PROTONIX) 40 mg/20 mL PACK Place 20 mLs (40 mg total) into feeding tube daily.  Marland Kitchen terazosin (HYTRIN) 10 MG capsule 1 capsule (10 mg total) by Per  J Tube route daily.  . Water For Irrigation, Sterile (FREE WATER) SOLN Place 300 mLs into feeding tube every 8 (eight) hours.   No facility-administered encounter medications on file as of 05/25/2019.    ALLERGIES:  Allergies  Allergen Reactions  . Eggs Or Egg-Derived Products Nausea And Vomiting     PHYSICAL EXAM:  ECOG Performance status: 1  Vitals:   05/25/19 1252  BP: 120/68  Pulse: 91  Resp: 14  Temp: (!) 97.1 F (36.2 C)  SpO2: 100%   Filed Weights   05/25/19 1252  Weight: 190 lb 4.8 oz (86.3 kg)    Physical Exam Vitals reviewed.  Constitutional:      Appearance: Normal appearance.  Cardiovascular:     Rate and Rhythm: Normal rate and regular rhythm.     Heart sounds: Normal heart sounds.  Pulmonary:     Effort: Pulmonary effort is normal.     Breath sounds: Normal breath sounds.  Abdominal:     Palpations: Abdomen is soft.     Comments: J-tube site is well-healed.  Staples in place.  Lymphadenopathy:     Cervical: No cervical adenopathy.  Skin:    General: Skin is warm.  Neurological:     General: No focal deficit present.     Mental Status: He is alert and oriented to person, place, and time.  Psychiatric:        Mood and Affect: Mood normal.         Behavior: Behavior normal.      LABORATORY DATA:  I have reviewed the labs as listed.  CBC    Component Value Date/Time   WBC 5.3 05/16/2019 0521   RBC 3.98 (L) 05/16/2019 0521   HGB 11.7 (L) 05/16/2019 0521   HCT 35.5 (L) 05/16/2019 0521   PLT 111 (L) 05/16/2019 0521   MCV 89.2 05/16/2019 0521   MCH 29.4 05/16/2019 0521   MCHC 33.0 05/16/2019 0521   RDW 14.5 05/16/2019 0521   CMP Latest Ref Rng & Units 05/16/2019 05/15/2019 05/14/2019  Glucose 70 - 99 mg/dL 278(H) 283(H) 222(H)  BUN 8 - 23 mg/dL 15 11 11   Creatinine 0.61 - 1.24 mg/dL 0.64 0.75 0.79  Sodium 135 - 145 mmol/L 141 139 137  Potassium 3.5 - 5.1 mmol/L 3.3(L) 3.5 3.1(L)  Chloride 98 - 111 mmol/L 104 102 103  CO2 22 - 32 mmol/L 27 26 25   Calcium 8.9 - 10.3 mg/dL 8.0(L) 8.1(L) 8.1(L)  Total Protein 6.5 - 8.1 g/dL - - -  Total Bilirubin 0.3 - 1.2 mg/dL - - -  Alkaline Phos 38 - 126 U/L - - -  AST 15 - 41 U/L - - -  ALT 0 - 44 U/L - - -       DIAGNOSTIC IMAGING:  I have independently reviewed the scans and discussed with the patient.   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 -EGD by Dr. Oneida Alar showed ulcerating mass in the distal esophagus extending from 37--39 cm. -Biopsies consistent with adenocarcinoma. -As he could not even swallow his sputum, a J-tube was placed for nutrition. -CT scan of the CAP on 05/09/2019 showed dilatation of the esophagus.  No other abnormalities. -PET CT scan on 05/23/2019 reviewed by me showed hypermetabolic distal esophageal mass.  There is 17 mm short axis gastrohepatic node  with SUV 5.5.  Additional small upper abdominal nodes, 8 mm short axis right celiac axis node, 8 mm short axis left para-aortic node SUV 3.6. -We will make a referral to Drs. Lightfoot/Gerhardt for prechemo radiation therapy evaluation. -We  will seek their opinion if we need to do endoscopic ultrasound for T staging. -Likely options include chemoradiation therapy followed by surgery versus definitive chemoradiation therapy. -We will make a referral to [place] in [facility] for consultation.  2.  Weight loss: -He lost about 30 pounds in the last 2 to 3 months. -He is not able to swallow his sputum. -He is using J-tube and taking an Osmolite 1.5 (8 ounces) 5 and half cartons in 24 hours as continuous drip.  He is also taking in prostate 30 mL twice daily.  Water 300 mL every 8 hours. -He reports about 3 pound weight gain.  3.  Peripheral neuropathy: -He has occasional tingling or numbness in the hands or feet.  No burning sensation. -She was on gabapentin which was discontinued after hospitalization.      Orders placed this encounter:  No orders of the defined types were placed in this encounter. Total time spent is 40 minutes with more than 50% of the time spent face-to-face discussing diagnosis, prognosis, scans, treatment plan, counseling and coordination of care.    Derek Jack, MD [place] [phone]

## 2019-05-25 NOTE — Assessment & Plan Note (Addendum)
1.  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 -EGD by Dr. Oneida Alar showed ulcerating mass in the distal esophagus extending from 37--39 cm. -Biopsies consistent with adenocarcinoma. -As he could not even swallow his sputum, a J-tube was placed for nutrition. -CT scan of the CAP on 05/09/2019 showed dilatation of the esophagus.  No other abnormalities. -PET CT scan on 05/23/2019 reviewed by me showed hypermetabolic distal esophageal mass.  There is 17 mm short axis gastrohepatic node with SUV 5.5.  Additional small upper abdominal nodes, 8 mm short axis right celiac axis node, 8 mm short axis left para-aortic node SUV 3.6. -We will make a referral to Drs. Lightfoot/Gerhardt for prechemo radiation therapy evaluation. -We will seek their opinion if we need to do endoscopic ultrasound for T staging. -Likely options include chemoradiation therapy followed by surgery versus definitive chemoradiation therapy. -We will make a referral to [place] in [facility] for consultation.  2.  Weight loss: -He lost about 30 pounds in the last 2 to 3 months. -He is not able to swallow his sputum. -He is using J-tube and taking an Osmolite 1.5 (8 ounces) 5 and half cartons in 24 hours as continuous drip.  He is also taking in prostate 30 mL twice daily.  Water 300 mL every 8 hours. -He reports about 3 pound weight gain.  3.  Peripheral neuropathy: -He has occasional tingling or numbness in the hands or feet.  No burning sensation. -She was on gabapentin which was discontinued after hospitalization.

## 2019-05-25 NOTE — Patient Instructions (Addendum)
[place] at [facility] Discharge Instructions  You were seen today by Dr. Delton Coombes. He went over your recent test results. You have stage 3 esophageal adenocarcinoma. Your cancer will require chemotherapy and radiation therapy with surgery after treatment. If you are not eligible for surgery then you will be treated with just chemotherapy and radiation. He will refer you to a surgeon to evaluate for surgery. He will also refer you to Dr. Darreld Mclean in [place] for radiation. He will see you back after your appointment with Dr. Kipp Brood for follow up.   Thank you for choosing [place] at [facility] to provide your oncology and hematology care.  To afford each patient quality time with our provider, please arrive at least 15 minutes before your scheduled appointment time.   If you have a lab appointment with the [facility] please come in thru the  Main Entrance and check in at the main information desk  You need to re-schedule your appointment should you arrive 10 or more minutes late.  We strive to give you quality time with our providers, and arriving late affects you and other patients whose appointments are after yours.  Also, if you no show three or more times for appointments you may be dismissed from the clinic at the providers discretion.     Again, thank you for choosing [facility].  Our hope is that these requests will decrease the amount of time that you wait before being seen by our physicians.       _____________________________________________________________  Should you have questions after your visit to [facility], please contact our office at [phone] between the hours of 8:00 a.m. and 4:30 p.m.  Voicemails left after 4:00 p.m. will not be returned until the following business day.  For prescription refill requests, have your pharmacy contact our office and allow 72 hours.    Cancer Center Support Programs:    > Cancer Support Group  2nd Tuesday of the month 1pm-2pm, Journey Room

## 2019-05-26 ENCOUNTER — Encounter (HOSPITAL_COMMUNITY): Payer: Self-pay | Admitting: *Deleted

## 2019-05-26 ENCOUNTER — Ambulatory Visit (HOSPITAL_COMMUNITY): Payer: 59 | Admitting: Hematology

## 2019-05-26 DIAGNOSIS — C159 Malignant neoplasm of esophagus, unspecified: Secondary | ICD-10-CM | POA: Diagnosis not present

## 2019-05-26 DIAGNOSIS — Z452 Encounter for adjustment and management of vascular access device: Secondary | ICD-10-CM | POA: Diagnosis not present

## 2019-05-26 DIAGNOSIS — J449 Chronic obstructive pulmonary disease, unspecified: Secondary | ICD-10-CM | POA: Diagnosis not present

## 2019-05-26 DIAGNOSIS — I10 Essential (primary) hypertension: Secondary | ICD-10-CM | POA: Diagnosis not present

## 2019-05-26 DIAGNOSIS — Z483 Aftercare following surgery for neoplasm: Secondary | ICD-10-CM | POA: Diagnosis not present

## 2019-05-26 DIAGNOSIS — Z934 Other artificial openings of gastrointestinal tract status: Secondary | ICD-10-CM | POA: Diagnosis not present

## 2019-05-26 NOTE — Progress Notes (Signed)
Patient's wife, Jana Half, called clinic this morning.  She had a few follow up questions following yesterday's visit. I was able to answer her questions about upcoming appointments, what to expect, possible recovery time following surgery if that is an option, general questions about chemotherapy.  I was able to answer her questions to her satisfaction.  I did defer a few questions she had to the surgeon when Mr. Bourgoin sees him and she had a question about prognosis and I advised her to table that question for Dr. Delton Coombes.  She was satisfied with that and responds that she is thankful for someone that can help guide them.  I advised that we will be in touch with her following his appointment with Dr. Kipp Brood with [facility].

## 2019-05-27 ENCOUNTER — Encounter (HOSPITAL_COMMUNITY): Payer: Self-pay | Admitting: Lab

## 2019-05-27 ENCOUNTER — Telehealth (HOSPITAL_COMMUNITY): Payer: Self-pay

## 2019-05-27 DIAGNOSIS — J449 Chronic obstructive pulmonary disease, unspecified: Secondary | ICD-10-CM | POA: Diagnosis not present

## 2019-05-27 DIAGNOSIS — Z483 Aftercare following surgery for neoplasm: Secondary | ICD-10-CM | POA: Diagnosis not present

## 2019-05-27 DIAGNOSIS — I10 Essential (primary) hypertension: Secondary | ICD-10-CM | POA: Diagnosis not present

## 2019-05-27 DIAGNOSIS — Z452 Encounter for adjustment and management of vascular access device: Secondary | ICD-10-CM | POA: Diagnosis not present

## 2019-05-27 DIAGNOSIS — C159 Malignant neoplasm of esophagus, unspecified: Secondary | ICD-10-CM | POA: Diagnosis not present

## 2019-05-27 DIAGNOSIS — Z934 Other artificial openings of gastrointestinal tract status: Secondary | ICD-10-CM | POA: Diagnosis not present

## 2019-05-27 NOTE — Telephone Encounter (Addendum)
Nutrition Assessment   Reason for Assessment:   Patient with new J-tube and esophageal cancer.   ASSESSMENT:  74 year old male with new esophageal cancer.  Noted hospital admission with J-tube placement on 2/5.  Past medical history of DM, COPD, GERD, gout, HTN. Likely chemoradiation followed by surgery vs definitive chemoradiation therapy.    Spoke with wife, Jana Half via phone and introduced self and service at [facility].  Wife is currently giving 5.5-6 cartons daily via pump of osmolite 1.5.  Rate of feeding is 66ml/hr per wife but has been unhooking to come to appointments and ADLs.  Wife has not been giving prostat 63ml BID due to cost ($193 at local drug store).  Has been flushing with 286ml of water in am with medications, 252ml of water mid-day and 231ml of water in the PM with medications.  Also gives a syringe of water (71ml) when unhooks patient to go to bathroom, appointment, etc.  Wife reports patient having bowel movement. Unable to tolerate anything by mouth, having to spit up saliva.     Medications: reviewed   Labs: reviewed   Anthropometrics:   Height: 73 inches Weight: 190 lb 4.8 oz 2/17 weight in cancer center UBW: 220 lb 2-3 months ago BMI: 25  14% weight loss in the last 2-3 months   Estimated Energy Needs  Kcals: 2150-2580 Protein: 107-129 g Fluid: > 2 L   NUTRITION DIAGNOSIS: Inadequate oral intake related to cancer as evidenced by J-tube placement   INTERVENTION:  Recommend 16 hour feeding vs 24 hours with upcoming MD appointments and radiation/chemotherapy treatments.  Rate would need to increase to 67ml/hr for 16 hours ( for total of 6 cartons of osmolite 1.5 to infuse.  Recommend free water flush of 915ml per day (359ml TID or 223ml 4.5 times per day). Ideally, needs prostat 64ml BID to better meet protein needs with this regimen.   Tube feeding will provide 2330 calories, 119 g protein, 1977ml free water.    Discussed with wife that she can purchase  prostat on amaz[employer] at lower cost.  Daughter is able to help her get this.  If cost is still a problem would recommend increasing osmolite 1.5 to 7 cartons per day to better meet nutritional needs.  Wife overwhelmed and unsure how to adjust pump rate up. Wife gave Arbie Cookey, home health RN number to this RD.  Spoke with Arbie Cookey regarding rate adjustment and free water flush.  Arbie Cookey will adjust rate of pump for wife and reiterate water flushes. Wife given contact information     MONITORING, EVALUATION, GOAL: Patient will consume adequate calories and protein via J-tube to prevent weight loss   Next Visit: phone f/u Friday, Feb 26  Shantara Goosby B. Zenia Resides, [place], [place] Registered Dietitian [phone] (pager)

## 2019-05-27 NOTE — Progress Notes (Unsigned)
Referral sent to [facility]. Records faxed on 2/19

## 2019-05-28 DIAGNOSIS — C159 Malignant neoplasm of esophagus, unspecified: Secondary | ICD-10-CM | POA: Diagnosis not present

## 2019-05-29 DIAGNOSIS — C159 Malignant neoplasm of esophagus, unspecified: Secondary | ICD-10-CM | POA: Diagnosis not present

## 2019-05-30 DIAGNOSIS — Z934 Other artificial openings of gastrointestinal tract status: Secondary | ICD-10-CM | POA: Diagnosis not present

## 2019-05-30 DIAGNOSIS — Z483 Aftercare following surgery for neoplasm: Secondary | ICD-10-CM | POA: Diagnosis not present

## 2019-05-30 DIAGNOSIS — C159 Malignant neoplasm of esophagus, unspecified: Secondary | ICD-10-CM | POA: Diagnosis not present

## 2019-05-30 DIAGNOSIS — J449 Chronic obstructive pulmonary disease, unspecified: Secondary | ICD-10-CM | POA: Diagnosis not present

## 2019-05-30 DIAGNOSIS — I10 Essential (primary) hypertension: Secondary | ICD-10-CM | POA: Diagnosis not present

## 2019-05-30 DIAGNOSIS — Z452 Encounter for adjustment and management of vascular access device: Secondary | ICD-10-CM | POA: Diagnosis not present

## 2019-05-31 ENCOUNTER — Encounter: Payer: Self-pay | Admitting: General Surgery

## 2019-05-31 ENCOUNTER — Other Ambulatory Visit: Payer: Self-pay

## 2019-05-31 ENCOUNTER — Ambulatory Visit (INDEPENDENT_AMBULATORY_CARE_PROVIDER_SITE_OTHER): Payer: Self-pay | Admitting: General Surgery

## 2019-05-31 VITALS — BP 119/74 | HR 87 | Temp 97.8°F | Resp 14 | Ht 73.0 in | Wt 189.2 lb

## 2019-05-31 DIAGNOSIS — I10 Essential (primary) hypertension: Secondary | ICD-10-CM | POA: Diagnosis not present

## 2019-05-31 DIAGNOSIS — E119 Type 2 diabetes mellitus without complications: Secondary | ICD-10-CM | POA: Diagnosis not present

## 2019-05-31 DIAGNOSIS — Z87891 Personal history of nicotine dependence: Secondary | ICD-10-CM | POA: Diagnosis not present

## 2019-05-31 DIAGNOSIS — Z09 Encounter for follow-up examination after completed treatment for conditions other than malignant neoplasm: Secondary | ICD-10-CM

## 2019-05-31 DIAGNOSIS — C155 Malignant neoplasm of lower third of esophagus: Secondary | ICD-10-CM | POA: Diagnosis not present

## 2019-05-31 DIAGNOSIS — Z7984 Long term (current) use of oral hypoglycemic drugs: Secondary | ICD-10-CM | POA: Diagnosis not present

## 2019-05-31 DIAGNOSIS — Z79899 Other long term (current) drug therapy: Secondary | ICD-10-CM | POA: Diagnosis not present

## 2019-05-31 NOTE — Progress Notes (Signed)
Subjective:     Kurt Duran  Here for postoperative visit.  He is tolerating his jejunostomy tube as well.  He denies any pain. Objective:    BP 119/74   Pulse 87   Temp 97.8 F (36.6 C) (Oral)   Resp 14   Ht 6\' 1"  (1.854 m)   Wt 189 lb 3.2 oz (85.8 kg)   SpO2 97%   BMI 24.96 kg/m   General:  alert, cooperative and no distress  Abdomen soft, incision healing well.  Staples removed, Steri-Strips applied.  Jejunostomy tube in appropriate position.  No drainage or erythema noted.     Assessment:    Doing well postoperatively.    Plan:   Follow-up here as needed.

## 2019-06-01 DIAGNOSIS — Z934 Other artificial openings of gastrointestinal tract status: Secondary | ICD-10-CM | POA: Diagnosis not present

## 2019-06-01 DIAGNOSIS — J449 Chronic obstructive pulmonary disease, unspecified: Secondary | ICD-10-CM | POA: Diagnosis not present

## 2019-06-01 DIAGNOSIS — Z452 Encounter for adjustment and management of vascular access device: Secondary | ICD-10-CM | POA: Diagnosis not present

## 2019-06-01 DIAGNOSIS — Z483 Aftercare following surgery for neoplasm: Secondary | ICD-10-CM | POA: Diagnosis not present

## 2019-06-01 DIAGNOSIS — I10 Essential (primary) hypertension: Secondary | ICD-10-CM | POA: Diagnosis not present

## 2019-06-01 DIAGNOSIS — C159 Malignant neoplasm of esophagus, unspecified: Secondary | ICD-10-CM | POA: Diagnosis not present

## 2019-06-02 DIAGNOSIS — C155 Malignant neoplasm of lower third of esophagus: Secondary | ICD-10-CM | POA: Diagnosis not present

## 2019-06-03 ENCOUNTER — Encounter: Payer: Self-pay | Admitting: Thoracic Surgery (Cardiothoracic Vascular Surgery)

## 2019-06-03 ENCOUNTER — Telehealth (HOSPITAL_COMMUNITY): Payer: Self-pay

## 2019-06-03 ENCOUNTER — Institutional Professional Consult (permissible substitution) (INDEPENDENT_AMBULATORY_CARE_PROVIDER_SITE_OTHER): Payer: Medicare Other | Admitting: Thoracic Surgery (Cardiothoracic Vascular Surgery)

## 2019-06-03 ENCOUNTER — Other Ambulatory Visit: Payer: Self-pay

## 2019-06-03 VITALS — BP 129/80 | HR 91 | Temp 97.6°F | Resp 20 | Ht 73.0 in | Wt 189.0 lb

## 2019-06-03 DIAGNOSIS — C159 Malignant neoplasm of esophagus, unspecified: Secondary | ICD-10-CM

## 2019-06-03 DIAGNOSIS — R110 Nausea: Secondary | ICD-10-CM | POA: Diagnosis not present

## 2019-06-03 NOTE — Progress Notes (Signed)
[address]       [place] [address]             [phone]                    [name] [facility] Medical Record [id number] Date of Birth: 07/26/1945  Referring: Derek Jack, MD Primary Care: Glenda Chroman, MD Primary Cardiologist: No primary care provider on file.  Chief Complaint:    Chief Complaint  Patient presents with  . Esophageal Cancer    Surgical consultation, PET Scan 05/23/19, Upper Endo 05/10/19, C/A/P CT 05/09/19, ECHO 05/10/19, Feeding jejunostomy tube placement, Port-A-Cath placement 05/23/19    History of Present Illness:    Kurt Duran 74 y.o. male referred by Dr. Delton Coombes for surgical evaluation an obstructing esophageal mass at the GE junction.  Patient has a long history of gastroesophageal reflux, and his symptoms got worse this year.  He also experienced significant dysphagia and was unable to keep anything down.  On February 2, he underwent an upper endoscopy was noted an ulcerated mass 37 to 39 cm from the incisors.  The mass was partially obstructing and circumferential.  Biopsy results revealed adenocarcinoma.  He was subsequently taken to the OR for open jejunostomy tube, and Port-A-Cath placement.  He has also had a 40lb weight loss since May of last year.      Past Medical History:  Diagnosis Date  . Anxiety   . Arthritis   . COPD (chronic obstructive pulmonary disease) ([facility])   . Diabetes mellitus without complication ([place])   . Diabetic retinopathy ([place])    NPDR OU  . Esophageal cancer ([facility] [place])   . GERD (gastroesophageal reflux disease)   . Gout   . Hypertension   . Hypertensive retinopathy    OU    Past Surgical History:  Procedure Laterality Date  . BIOPSY  05/10/2019   Procedure: BIOPSY;  Surgeon: Danie Binder, MD;  Location: [facility];  Service: Endoscopy;;  gastric esophageal  . CATARACT EXTRACTION W/PHACO Left 04/03/2016   Procedure: CATARACT EXTRACTION PHACO AND INTRAOCULAR LENS PLACEMENT  LEFT EYE CDE= 11.33;  Surgeon: Tonny Branch, MD;  Location: [facility];  Service: Ophthalmology;  Laterality: Left;  left  . CATARACT EXTRACTION W/PHACO Right 04/28/2016   Procedure: CATARACT EXTRACTION PHACO AND INTRAOCULAR LENS PLACEMENT (IOC);  Surgeon: Tonny Branch, MD;  Location: [facility];  Service: Ophthalmology;  Laterality: Right;  [id number]   . ESOPHAGOGASTRODUODENOSCOPY (EGD) WITH PROPOFOL N/A 05/10/2019   Procedure: ESOPHAGOGASTRODUODENOSCOPY (EGD) WITH PROPOFOL with dilation as appropriate.;  Surgeon: Danie Binder, MD;  Location: [facility];  Service: Endoscopy;  Laterality: N/A;  . EYE SURGERY Bilateral    Cat Sx OU  . JEJUNOSTOMY N/A 05/13/2019   Procedure: JEJUNOSTOMY FEEDING TUBE PLACEMENT (Procedure #2);  Surgeon: Aviva Signs, MD;  Location: [facility];  Service: General;  Laterality: N/A;  . KNEE ARTHROSCOPY Right   . NECK SURGERY    . PORTACATH PLACEMENT Right 05/13/2019   Procedure: INSERTION PORT-A-CATH (attached catheter in right subclavian) (Procedure #1);  Surgeon: Aviva Signs, MD;  Location: [facility];  Service: General;  Laterality: Right;  . ROTATOR CUFF REPAIR Right     Family History  Adopted: Yes  Problem Relation Age of Onset  . Hypertension Brother   . Kidney disease Brother      Social History   Tobacco Use  Smoking Status Former Smoker  . Types: Cigarettes  .  Quit date: 03/26/1989  . Years since quitting: 30.2  Smokeless Tobacco Never Used    Social History   Substance and Sexual Activity  Alcohol Use No     Allergies  Allergen Reactions  . Eggs Or Egg-Derived Products Nausea And Vomiting    Current Outpatient Medications  Medication Sig Dispense Refill  . acetaminophen (TYLENOL) 325 MG tablet Place 2 tablets (650 mg total) into feeding tube every 6 (six) hours as needed for mild pain (or Fever >/= 101).    Marland Kitchen albuterol (PROVENTIL HFA;VENTOLIN HFA) 108 (90 Base) MCG/ACT inhaler Inhale 2 puffs into the lungs every 4 (four) hours as needed for  wheezing or shortness of breath.    Marland Kitchen albuterol (PROVENTIL) (2.5 MG/3ML) 0.083% nebulizer solution Take 2.5 mg by nebulization every 6 (six) hours as needed for wheezing or shortness of breath.    . ALPRAZolam (XANAX) 0.5 MG tablet 1 tablet (0.5 mg total) by Per J Tube route 2 (two) times daily.  0  . Amino Acids-Protein Hydrolys (FEEDING SUPPLEMENT, PRO-STAT SUGAR FREE 64,) LIQD 30 mLs by Per J Tube route 2 (two) times daily. 887 mL 1  . atorvastatin (LIPITOR) 40 MG tablet 1 tablet (40 mg total) by Per J Tube route every evening. for cholesterol    . famotidine (PEPCID) 20 MG tablet Place 1 tablet (20 mg total) into feeding tube daily.    Marland Kitchen FLUoxetine (PROZAC) 20 MG capsule Place 1 capsule (20 mg total) into feeding tube daily.  0  . glipiZIDE (GLUCOTROL) 10 MG tablet Place 1 tablet (10 mg total) into feeding tube 2 (two) times daily before a meal. 60 tablet 0  . metFORMIN (GLUCOPHAGE) 1000 MG tablet 1 tablet (1,000 mg total) by Per J Tube route 2 (two) times daily.  0  . Nutritional Supplements (FEEDING SUPPLEMENT, OSMOLITE 1.5 CAL,) LIQD Place 1,000 mLs into feeding tube continuous. 1000 mL 3  . pantoprazole sodium (PROTONIX) 40 mg/20 mL PACK Place 20 mLs (40 mg total) into feeding tube daily. 600 mL 0  . terazosin (HYTRIN) 10 MG capsule 1 capsule (10 mg total) by Per J Tube route daily.  0  . Water For Irrigation, Sterile (FREE WATER) SOLN Place 300 mLs into feeding tube every 8 (eight) hours. 300 mL 0   No current facility-administered medications for this visit.    Review of Systems  Constitutional: Positive for malaise/fatigue and weight loss. Negative for fever.  HENT: Negative.   Eyes: Negative.   Respiratory: Positive for shortness of breath.   Cardiovascular: Negative for chest pain.  Gastrointestinal: Positive for heartburn.  Musculoskeletal: Positive for joint pain.  Skin: Negative.   Neurological: Negative.     PHYSICAL EXAMINATION: BP 129/80   Pulse 91   Temp 97.6 F  (36.4 C) (Skin)   Resp 20   Ht 6\' 1"  (1.854 m)   Wt 189 lb (85.7 kg)   SpO2 98% Comment: RA  BMI 24.94 kg/m   Physical Exam  Constitutional: No distress.  Frail, deconditioned Uses can to ambulate Very slow pace, somewhat unsteady gait  HENT:  Head: Normocephalic.  Eyes: EOM are normal. No scleral icterus.  Neck:    Cardiovascular: Normal rate and regular rhythm.  Pulmonary/Chest: Effort normal. No respiratory distress.  Abdominal: Soft.    Umbilical hernia midlind incision J tube in position  Skin: He is not diaphoretic.     Diagnostic Studies & Laboratory data:     Recent Radiology Findings:   CT Chest W Contrast  Result Date: 05/09/2019 CLINICAL DATA:  Nausea and vomiting. EXAM: CT CHEST, ABDOMEN, AND PELVIS WITH CONTRAST TECHNIQUE: Multidetector CT imaging of the chest, abdomen and pelvis was performed following the standard protocol during bolus administration of intravenous contrast. CONTRAST:  170mL OMNIPAQUE IOHEXOL 300 MG/ML  SOLN COMPARISON:  Abdomen pelvis CT, dated January 14, 2007, is available for comparison. FINDINGS: CT CHEST FINDINGS Cardiovascular: There is moderate severity calcification of the thoracic aorta. Normal heart size. No pericardial effusion. Marked severity coronary artery calcification is seen. Mediastinum/Nodes: No enlarged mediastinal, hilar, or axillary lymph nodes. Small cystic structures are seen within both lobes of the thyroid gland. Dilatation of the esophagus is seen which measures approximately 2.7 cm x 1.8 cm. A small hiatal hernia is noted. Lungs/Pleura: Lungs are clear. No pleural effusion or pneumothorax. Musculoskeletal: Multilevel degenerative changes seen throughout the thoracic spine. CT ABDOMEN PELVIS FINDINGS Hepatobiliary: No focal liver abnormality is seen. No gallstones, gallbladder wall thickening, or biliary dilatation. Pancreas: Unremarkable. No pancreatic ductal dilatation or surrounding inflammatory changes. Spleen: Normal  in size without focal abnormality. Adrenals/Urinary Tract: The right adrenal gland is normal in appearance. 0.6 cm 1.0 cm fat density left adrenal masses are seen. Kidneys are normal, without renal calculi or hydronephrosis. A 5 mm cyst is seen within the posterolateral aspect of the mid right kidney. Bladder is unremarkable. Stomach/Bowel: Stomach is within normal limits. Appendix appears normal. No evidence of bowel wall thickening, distention, or inflammatory changes. Noninflamed diverticula is seen throughout the descending colon. Vascular/Lymphatic: Moderate to marked severity aortic atherosclerosis. No enlarged abdominal or pelvic lymph nodes. Reproductive: Prostate is unremarkable. Other: No abdominal wall hernia or abnormality. No abdominopelvic ascites. Musculoskeletal: A predominant stable 9 mm sclerotic focus is seen within the posterior aspect of the sacrum on the left. Degenerative changes seen throughout the lumbar spine. IMPRESSION: 1. No evidence of acute or active cardiopulmonary disease. 2. Small hiatal hernia. 3. Small left adrenal myelolipomas. 4. Noninflamed diverticula of the descending colon. Electronically Signed   By: Virgina Norfolk M.D.   On: 05/09/2019 22:07   CT ABDOMEN PELVIS W CONTRAST  Result Date: 05/09/2019 CLINICAL DATA:  Nausea and vomiting. EXAM: CT CHEST, ABDOMEN, AND PELVIS WITH CONTRAST TECHNIQUE: Multidetector CT imaging of the chest, abdomen and pelvis was performed following the standard protocol during bolus administration of intravenous contrast. CONTRAST:  158mL OMNIPAQUE IOHEXOL 300 MG/ML  SOLN COMPARISON:  Abdomen pelvis CT, dated January 14, 2007, is available for comparison. FINDINGS: CT CHEST FINDINGS Cardiovascular: There is moderate severity calcification of the thoracic aorta. Normal heart size. No pericardial effusion. Marked severity coronary artery calcification is seen. Mediastinum/Nodes: No enlarged mediastinal, hilar, or axillary lymph nodes. Small  cystic structures are seen within both lobes of the thyroid gland. Dilatation of the esophagus is seen which measures approximately 2.7 cm x 1.8 cm. A small hiatal hernia is noted. Lungs/Pleura: Lungs are clear. No pleural effusion or pneumothorax. Musculoskeletal: Multilevel degenerative changes seen throughout the thoracic spine. CT ABDOMEN PELVIS FINDINGS Hepatobiliary: No focal liver abnormality is seen. No gallstones, gallbladder wall thickening, or biliary dilatation. Pancreas: Unremarkable. No pancreatic ductal dilatation or surrounding inflammatory changes. Spleen: Normal in size without focal abnormality. Adrenals/Urinary Tract: The right adrenal gland is normal in appearance. 0.6 cm 1.0 cm fat density left adrenal masses are seen. Kidneys are normal, without renal calculi or hydronephrosis. A 5 mm cyst is seen within the posterolateral aspect of the mid right kidney. Bladder is unremarkable. Stomach/Bowel: Stomach is within normal limits. Appendix appears  normal. No evidence of bowel wall thickening, distention, or inflammatory changes. Noninflamed diverticula is seen throughout the descending colon. Vascular/Lymphatic: Moderate to marked severity aortic atherosclerosis. No enlarged abdominal or pelvic lymph nodes. Reproductive: Prostate is unremarkable. Other: No abdominal wall hernia or abnormality. No abdominopelvic ascites. Musculoskeletal: A predominant stable 9 mm sclerotic focus is seen within the posterior aspect of the sacrum on the left. Degenerative changes seen throughout the lumbar spine. IMPRESSION: 1. No evidence of acute or active cardiopulmonary disease. 2. Small hiatal hernia. 3. Small left adrenal myelolipomas. 4. Noninflamed diverticula of the descending colon. Electronically Signed   By: Virgina Norfolk M.D.   On: 05/09/2019 22:07   NM PET Image Initial (PI) Skull Base To Thigh  Result Date: 05/23/2019 CLINICAL DATA:  Initial treatment strategy for esophageal cancer. EXAM:  NUCLEAR MEDICINE PET SKULL BASE TO THIGH TECHNIQUE: 12.2 mCi F-18 FDG was injected intravenously. Full-ring PET imaging was performed from the skull base to thigh after the radiotracer. CT data was obtained and used for attenuation correction and anatomic localization. Fasting blood glucose: 146 mg/dl COMPARISON:  CT chest abdomen pelvis dated 05/09/2019. FINDINGS: Mediastinal blood pool activity: SUV max 2.6 Liver activity: SUV max NA NECK: No hypermetabolic cervical lymphadenopathy. Incidental CT findings: none CHEST: Dilated, fluid filled esophagus. Masslike thickening of the distal esophagus extending to just above the GE junction (series 3/image 146), max SUV 13.2. No hypermetabolic thoracic lymphadenopathy. No suspicious pulmonary nodules. Incidental CT findings: Right chest port terminates in the mid SVC. Atherosclerotic calcifications aortic root/arch. Three vessel coronary atherosclerosis. ABDOMEN/PELVIS: 17 mm short axis gastrohepatic node (series 3/image 167), max SUV 5.5. Additional small upper abdominal nodes, including an 8 mm short axis right celiac axis node (series 3/image 172) and an 8 mm short axis left para-aortic node (series 3/image 185), max SUV 3.6. No abnormal hypermetabolism in the liver, spleen, pancreas, or adrenal glands. Percutaneous jejunostomy. Incidental CT findings: Cholelithiasis. Atherosclerotic calcifications the abdominal aorta and branch vessels. Midline skin staples. SKELETON: No focal hypermetabolic activity to suggest skeletal metastasis. Incidental CT findings: Degenerative changes of the visualized thoracolumbar spine. IMPRESSION: Hypermetabolic distal esophageal mass, corresponding to the patient's known esophageal adenocarcinoma. Upper abdominal nodal metastases, including a dominant 17 mm short axis gastrohepatic node. Additional ancillary findings as above. Electronically Signed   By: Julian Hy M.D.   On: 05/23/2019 21:06   DG Chest Port 1 View  Result  Date: 05/13/2019 CLINICAL DATA:  Status post Port-A-Cath placement today. EXAM: PORTABLE CHEST 1 VIEW COMPARISON:  Single-view of the chest 12/17/2017. FINDINGS: Right subclavian approach Port-A-Cath is in place with the tip in the mid to lower superior vena cava. Tubing is intact. No pneumothorax. Lungs clear. Heart size normal. Atherosclerosis noted. The patient is status post right shoulder surgery. IMPRESSION: Tip of right subclavian approach Port-A-Cath projects in the mid to lower superior vena cava. No pneumothorax. No acute disease. Atherosclerosis. Electronically Signed   By: Inge Rise M.D.   On: 05/13/2019 09:42   DG C-Arm 1-60 Min-No Report  Result Date: 05/13/2019 Fluoroscopy was utilized by the requesting physician.  No radiographic interpretation.   ECHOCARDIOGRAM COMPLETE  Result Date: 05/10/2019   ECHOCARDIOGRAM REPORT   Patient Name:   Kurt Duran Date of Exam: 05/10/2019 Medical Rec #:  [id number]       Height:       73.0 in Accession #:    [id number]      Weight:       183.6 lb Date of  Birth:  03-05-1946       BSA:          2.08 m Patient Age:    26 years        BP:           161/84 mmHg Patient Gender: M               HR:           81 bpm. Exam Location:  [facility] Procedure: 2D Echo, Cardiac Doppler and Color Doppler Indications:    Dyspnea 786.09 / R06.00  History:        Patient has no prior history of Echocardiogram examinations.                 COPD; Risk Factors:Hypertension and Diabetes. GERD.  Sonographer:    Alvino Chapel RCS Referring Phys: [phone] COURAGE EMOKPAE IMPRESSIONS  1. Left ventricular ejection fraction, by visual estimation, is 60 to 65%. The left ventricle has normal function. There is mildly increased left ventricular hypertrophy.  2. Left ventricular diastolic parameters are consistent with Grade I diastolic dysfunction (impaired relaxation).  3. The left ventricle has no regional wall motion abnormalities.  4. Global right ventricle has normal systolic  function.The right ventricular size is normal. No increase in right ventricular wall thickness.  5. Left atrial size was normal.  6. Right atrial size was normal.  7. Mild mitral annular calcification.  8. The mitral valve is grossly normal. Trivial mitral valve regurgitation.  9. The tricuspid valve is grossly normal. 10. The tricuspid valve is grossly normal. Tricuspid valve regurgitation is trivial. 11. The aortic valve is tricuspid. Aortic valve regurgitation is not visualized. Mild aortic valve stenosis. 12. The pulmonic valve was not well visualized. Pulmonic valve regurgitation is not visualized. 13. The inferior vena cava is normal in size with greater than 50% respiratory variability, suggesting right atrial pressure of 3 mmHg. FINDINGS  Left Ventricle: Left ventricular ejection fraction, by visual estimation, is 60 to 65%. The left ventricle has normal function. The left ventricle has no regional wall motion abnormalities. The left ventricular internal cavity size was the left ventricle is normal in size. There is mildly increased left ventricular hypertrophy. Concentric left ventricular hypertrophy. Left ventricular diastolic parameters are consistent with Grade I diastolic dysfunction (impaired relaxation). Indeterminate filling pressures. Right Ventricle: The right ventricular size is normal. No increase in right ventricular wall thickness. Global RV systolic function is has normal systolic function. Left Atrium: Left atrial size was normal in size. Right Atrium: Right atrial size was normal in size Pericardium: There is no evidence of pericardial effusion. Mitral Valve: The mitral valve is grossly normal. Mild mitral annular calcification. Trivial mitral valve regurgitation. Tricuspid Valve: The tricuspid valve is grossly normal. Tricuspid valve regurgitation is trivial. Aortic Valve: The aortic valve is tricuspid. . There is mild thickening of the aortic valve. Aortic valve regurgitation is not  visualized. Mild aortic stenosis is present. Mild aortic valve annular calcification. There is mild thickening of the aortic valve. Aortic valve mean gradient measures 8.0 mmHg. Aortic valve peak gradient measures 16.6 mmHg. Aortic valve area, by VTI measures 1.79 cm. Pulmonic Valve: The pulmonic valve was not well visualized. Pulmonic valve regurgitation is not visualized. Pulmonic regurgitation is not visualized. Aorta: The aortic root is normal in size and structure. Venous: The inferior vena cava is normal in size with greater than 50% respiratory variability, suggesting right atrial pressure of 3 mmHg. IAS/Shunts: No atrial level shunt  detected by color flow Doppler.  LEFT VENTRICLE PLAX 2D LVIDd:         4.67 cm  Diastology LVIDs:         2.51 cm  LV e' lateral:   7.83 cm/s LV PW:         1.02 cm  LV E/e' lateral: 12.9 LV IVS:        1.11 cm  LV e' medial:    7.29 cm/s LVOT diam:     2.00 cm  LV E/e' medial:  13.9 LV SV:         78 ml LV SV Index:   37.74 LVOT Area:     3.14 cm  RIGHT VENTRICLE RV S prime:     17.80 cm/s TAPSE (M-mode): 2.3 cm LEFT ATRIUM           Index       RIGHT ATRIUM           Index LA diam:      3.30 cm 1.59 cm/m  RA Area:     20.50 cm LA Vol (A2C): 49.3 ml 23.76 ml/m RA Volume:   56.70 ml  27.33 ml/m LA Vol (A4C): 47.6 ml 22.94 ml/m  AORTIC VALVE AV Area (Vmax):    1.68 cm AV Area (Vmean):   1.85 cm AV Area (VTI):     1.79 cm AV Vmax:           204.00 cm/s AV Vmean:          131.000 cm/s AV VTI:            0.408 m AV Peak Grad:      16.6 mmHg AV Mean Grad:      8.0 mmHg LVOT Vmax:         109.00 cm/s LVOT Vmean:        77.100 cm/s LVOT VTI:          0.232 m LVOT/AV VTI ratio: 0.57  AORTA Ao Root diam: 3.40 cm MITRAL VALVE MV Area (PHT): 2.91 cm              SHUNTS MV PHT:        75.69 msec            Systemic VTI:  0.23 m MV Decel Time: 261 msec              Systemic Diam: 2.00 cm MV E velocity: 101.00 cm/s 103 cm/s MV A velocity: 110.00 cm/s 70.3 cm/s MV E/A ratio:  0.92         1.5  Kate Sable MD Electronically signed by Kate Sable MD Signature Date/Time: 05/10/2019/3:35:48 PM    Final     Lower 1/3 of the esophagus  I have independently reviewed the above radiology studies  and reviewed the findings with the patient.   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  Albumin on 2/3 4.1   Assessment / Plan:   74 yo male with biopsy proven adenocarcinoma arising from the distal esophagus.  It is an ulcerating lesion, that is partially obstructing.  Based off of the bleeding it is at least at T2 lesion.  There is upper abdominal lymphadenopathy which potentially makes this a Stage III cancer.  On cross sectional imaging there dose not appear to be any invasion into other thoracic structures.  I do not think that an EUS will be possible given the caliber of the lumen based on EGD.    He is scheduled to start his concurrent chemoradiation early next month, and will be on tube feeds throughout the process.  I will follow-up with him on a 2-3 week interval to insure that he maintains his weight and potentially gains weight throughout the course.  At this visit he weighed 189lbs.  If he drops below 170lbs, he will be at higher risk of surgical complications.  I further explained to Mr. Rahl and his wife that after completing his chemo-rads, he will undergo repeat PET/CT at 4 weeks, and then be  scheduled for surgery 2-3 weeks after that.  Given his previous cervical operation, I think that he would be a better candidate for a robotic assisted Ivor-Lewis esophagectomy.  I will be seeing him at regular intervals throughout his treatment, and will discuss details of the operation as we get closer to the date.  Cameo Shewell Bary Leriche

## 2019-06-03 NOTE — Telephone Encounter (Signed)
Nutrition Follow-up:  Patient with esophageal cancer.  Planning concurrent chemotherapy and radiation.  Possible surgery after treatment is completed.   Wife returned RD's call.  Reports that patient is doing well with osmolite 1.5 at 22ml/hr for 16 hours (3pm-7am).  Wife reports that all 6 cartons of osmolite are getting infused during this timeframe.  Wife using 2, 16.9 oz bottles of water (1015ml) daily for flushing.  Reports that daughter ordered prostat online and will be here in few days.  Wife reports patient having loose, soft stool about 2 times per day, not watery or diarrhea.     Medications: reviewed  Labs: reviewed  Anthropometrics:   Weight 189 lb 3.2 oz on 2/23 slight decrease from 190 lb on 2/17 UBW 220 lb   Estimated Energy Needs  Kcals: 2150-2580 Protein: 107-129 g Fluid: > 2 L  NUTRITION DIAGNOSIS: Inadequate oral intake continues but relying on feeding tube for nutrition   INTERVENTION:  Continue osmolite 1.5 at 29ml/hr for 16 hours (3pm-7am) using 6 bottles per day.  Flushing with 1062ml water during the day.   Recommend adding prostat 84ml q daily or 40ml BID whatever wife prefers.  Reviewed mixing instructions with her (mix with equal parts water) flush tube first then protein liquid and water mixture and water flush after.   Tube feeding regimen will provide 2330 calories, 109-119 g (pending if using prostat or prosource) and 2291ml free water. RD has samples of prosource (2 bottles) that can provide to patient on 3/2 at next appointment.  Wife agreeable to pick up.   Wife has RD contact information    MONITORING, EVALUATION, GOAL: Patient will utilize feeding tube to maintain weight during treatment   NEXT VISIT: March 5th phone f/u  Libbey Duce B. Zenia Resides, [place], [place] Registered Dietitian [phone] (pager)

## 2019-06-03 NOTE — Telephone Encounter (Signed)
Nutrition  Called for nutrition follow-up.  Left message on voicemail with call back number.   RD also called Home health RN Arbie Cookey and left message as well.  Ernesteen Mihalic B. Zenia Resides, [place], [place] Registered Dietitian [phone] (pager)

## 2019-06-06 ENCOUNTER — Other Ambulatory Visit (HOSPITAL_COMMUNITY): Payer: Self-pay | Admitting: *Deleted

## 2019-06-06 DIAGNOSIS — C159 Malignant neoplasm of esophagus, unspecified: Secondary | ICD-10-CM

## 2019-06-06 DIAGNOSIS — Z483 Aftercare following surgery for neoplasm: Secondary | ICD-10-CM | POA: Diagnosis not present

## 2019-06-06 DIAGNOSIS — I10 Essential (primary) hypertension: Secondary | ICD-10-CM | POA: Diagnosis not present

## 2019-06-06 DIAGNOSIS — Z452 Encounter for adjustment and management of vascular access device: Secondary | ICD-10-CM | POA: Diagnosis not present

## 2019-06-06 DIAGNOSIS — J449 Chronic obstructive pulmonary disease, unspecified: Secondary | ICD-10-CM | POA: Diagnosis not present

## 2019-06-06 DIAGNOSIS — Z934 Other artificial openings of gastrointestinal tract status: Secondary | ICD-10-CM | POA: Diagnosis not present

## 2019-06-07 ENCOUNTER — Other Ambulatory Visit: Payer: Self-pay

## 2019-06-07 ENCOUNTER — Inpatient Hospital Stay (HOSPITAL_COMMUNITY): Payer: Medicare Other

## 2019-06-07 ENCOUNTER — Inpatient Hospital Stay (HOSPITAL_COMMUNITY): Payer: Medicare Other | Attending: Hematology | Admitting: Hematology

## 2019-06-07 ENCOUNTER — Encounter (HOSPITAL_COMMUNITY): Payer: Self-pay | Admitting: Hematology

## 2019-06-07 ENCOUNTER — Encounter (HOSPITAL_COMMUNITY): Payer: Self-pay

## 2019-06-07 DIAGNOSIS — I10 Essential (primary) hypertension: Secondary | ICD-10-CM | POA: Insufficient documentation

## 2019-06-07 DIAGNOSIS — C160 Malignant neoplasm of cardia: Secondary | ICD-10-CM | POA: Diagnosis not present

## 2019-06-07 DIAGNOSIS — Z7984 Long term (current) use of oral hypoglycemic drugs: Secondary | ICD-10-CM | POA: Diagnosis not present

## 2019-06-07 DIAGNOSIS — R634 Abnormal weight loss: Secondary | ICD-10-CM | POA: Diagnosis not present

## 2019-06-07 DIAGNOSIS — R110 Nausea: Secondary | ICD-10-CM | POA: Insufficient documentation

## 2019-06-07 DIAGNOSIS — Z79899 Other long term (current) drug therapy: Secondary | ICD-10-CM | POA: Insufficient documentation

## 2019-06-07 DIAGNOSIS — R200 Anesthesia of skin: Secondary | ICD-10-CM | POA: Insufficient documentation

## 2019-06-07 DIAGNOSIS — F419 Anxiety disorder, unspecified: Secondary | ICD-10-CM | POA: Diagnosis not present

## 2019-06-07 DIAGNOSIS — C159 Malignant neoplasm of esophagus, unspecified: Secondary | ICD-10-CM

## 2019-06-07 DIAGNOSIS — K219 Gastro-esophageal reflux disease without esophagitis: Secondary | ICD-10-CM | POA: Diagnosis not present

## 2019-06-07 DIAGNOSIS — M129 Arthropathy, unspecified: Secondary | ICD-10-CM | POA: Diagnosis not present

## 2019-06-07 DIAGNOSIS — G629 Polyneuropathy, unspecified: Secondary | ICD-10-CM | POA: Diagnosis not present

## 2019-06-07 DIAGNOSIS — Z87891 Personal history of nicotine dependence: Secondary | ICD-10-CM | POA: Diagnosis not present

## 2019-06-07 DIAGNOSIS — E871 Hypo-osmolality and hyponatremia: Secondary | ICD-10-CM | POA: Insufficient documentation

## 2019-06-07 DIAGNOSIS — Z934 Other artificial openings of gastrointestinal tract status: Secondary | ICD-10-CM | POA: Diagnosis not present

## 2019-06-07 DIAGNOSIS — E119 Type 2 diabetes mellitus without complications: Secondary | ICD-10-CM | POA: Insufficient documentation

## 2019-06-07 DIAGNOSIS — Z5111 Encounter for antineoplastic chemotherapy: Secondary | ICD-10-CM | POA: Insufficient documentation

## 2019-06-07 DIAGNOSIS — Z483 Aftercare following surgery for neoplasm: Secondary | ICD-10-CM | POA: Diagnosis not present

## 2019-06-07 DIAGNOSIS — J449 Chronic obstructive pulmonary disease, unspecified: Secondary | ICD-10-CM | POA: Insufficient documentation

## 2019-06-07 DIAGNOSIS — Z95828 Presence of other vascular implants and grafts: Secondary | ICD-10-CM | POA: Insufficient documentation

## 2019-06-07 DIAGNOSIS — Z452 Encounter for adjustment and management of vascular access device: Secondary | ICD-10-CM | POA: Diagnosis not present

## 2019-06-07 DIAGNOSIS — R948 Abnormal results of function studies of other organs and systems: Secondary | ICD-10-CM | POA: Insufficient documentation

## 2019-06-07 HISTORY — DX: Presence of other vascular implants and grafts: Z95.828

## 2019-06-07 LAB — COMPREHENSIVE METABOLIC PANEL
ALT: 52 U/L — ABNORMAL HIGH (ref 0–44)
AST: 28 U/L (ref 15–41)
Albumin: 3.6 g/dL (ref 3.5–5.0)
Alkaline Phosphatase: 93 U/L (ref 38–126)
Anion gap: 10 (ref 5–15)
BUN: 17 mg/dL (ref 8–23)
CO2: 29 mmol/L (ref 22–32)
Calcium: 9.1 mg/dL (ref 8.9–10.3)
Chloride: 96 mmol/L — ABNORMAL LOW (ref 98–111)
Creatinine, Ser: 0.71 mg/dL (ref 0.61–1.24)
GFR calc Af Amer: >60 mL/min (ref >60)
GFR calc non Af Amer: >60 mL/min (ref >60)
Glucose, Bld: 166 mg/dL — ABNORMAL HIGH (ref 70–99)
Potassium: 4.3 mmol/L (ref 3.5–5.1)
Sodium: 135 mmol/L (ref 135–145)
Total Bilirubin: 0.6 mg/dL (ref 0.3–1.2)
Total Protein: 6.7 g/dL (ref 6.5–8.1)

## 2019-06-07 LAB — CBC WITH DIFFERENTIAL/PLATELET
Abs Immature Granulocytes: 0.03 10*3/uL (ref 0.00–0.07)
Basophils Absolute: 0 10*3/uL (ref 0.0–0.1)
Basophils Relative: 0 %
Eosinophils Absolute: 0.1 10*3/uL (ref 0.0–0.5)
Eosinophils Relative: 2 %
HCT: 37 % — ABNORMAL LOW (ref 39.0–52.0)
Hemoglobin: 12.2 g/dL — ABNORMAL LOW (ref 13.0–17.0)
Immature Granulocytes: 1 %
Lymphocytes Relative: 28 %
Lymphs Abs: 1.5 10*3/uL (ref 0.7–4.0)
MCH: 29.7 pg (ref 26.0–34.0)
MCHC: 33 g/dL (ref 30.0–36.0)
MCV: 90 fL (ref 80.0–100.0)
Monocytes Absolute: 0.4 10*3/uL (ref 0.1–1.0)
Monocytes Relative: 7 %
Neutro Abs: 3.4 10*3/uL (ref 1.7–7.7)
Neutrophils Relative %: 62 %
Platelets: 134 10*3/uL — ABNORMAL LOW (ref 150–400)
RBC: 4.11 MIL/uL — ABNORMAL LOW (ref 4.22–5.81)
RDW: 14 % (ref 11.5–15.5)
WBC: 5.5 10*3/uL (ref 4.0–10.5)
nRBC: 0 % (ref 0.0–0.2)

## 2019-06-07 MED ORDER — PROCHLORPERAZINE MALEATE 10 MG PO TABS: 10.0000 mg | ORAL_TABLET | Freq: Four times a day (QID) | ORAL | 1 refills | Status: DC | PRN

## 2019-06-07 MED ORDER — LIDOCAINE-PRILOCAINE 2.5-2.5 % EX CREA: TOPICAL_CREAM | CUTANEOUS | 3 refills | Status: AC

## 2019-06-07 NOTE — Patient Instructions (Addendum)
[place] at [facility] Discharge Instructions  You were seen today by Dr. Delton Coombes. He went over your recent lab results. He discussed your treatment, weekly Carbo/Taxol and it's sides effects. He will see you back in 1 week for labs, treatment and follow up.   Thank you for choosing [place] at [facility] to provide your oncology and hematology care.  To afford each patient quality time with our provider, please arrive at least 15 minutes before your scheduled appointment time.   If you have a lab appointment with the [facility] please come in thru the  Main Entrance and check in at the main information desk  You need to re-schedule your appointment should you arrive 10 or more minutes late.  We strive to give you quality time with our providers, and arriving late affects you and other patients whose appointments are after yours.  Also, if you no show three or more times for appointments you may be dismissed from the clinic at the providers discretion.     Again, thank you for choosing [facility].  Our hope is that these requests will decrease the amount of time that you wait before being seen by our physicians.       _____________________________________________________________  Should you have questions after your visit to [facility], please contact our office at [phone] between the hours of 8:00 a.m. and 4:30 p.m.  Voicemails left after 4:00 p.m. will not be returned until the following business day.  For prescription refill requests, have your pharmacy contact our office and allow 72 hours.    Cancer Center Support Programs:   > Cancer Support Group  2nd Tuesday of the month 1pm-2pm, Journey Room

## 2019-06-07 NOTE — Progress Notes (Signed)
I met with patient and his wife during the visit with Dr. Katragadda today.  He was here today with his wife Martha.  I provided them with written material on the chemotherapy regimen offered to him by Dr. Katragadda.  He was advised that he will get detailed information on Thursday at his chemo teaching class.  They were advised to call if they have any questions.  They verbalize understanding.  

## 2019-06-07 NOTE — Patient Instructions (Signed)
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  You will be treated weekly for 6 weeks with a combination of chemotherapy drugs in conjunction with radiation therapy.  The chemotherapy drugs you will receive are called paclitaxel (Taxol) and carboplatin.  The intent of treatment is to cure your disease.  You will see the doctor regularly throughout treatment.  We will obtain blood work from you prior to every treatment and monitor your results to make sure it is safe to give your treatment. The doctor monitors your response to treatment by the way you are feeling, your blood work, and by obtaining scans periodically.  There will be wait times while you are here for treatment.  It will take about 30 minutes to 1 hour for your lab work to result.  Then there will be wait times while pharmacy mixes your medications.    Medications you will receive in the clinic prior to your chemotherapy medications:  Aloxi:  ALOXI is used in adults to help prevent the nausea and vomiting that happens with certain chemotherapy drugs.  Aloxi is a long acting medication, and will remain in your system for about 2 days.   Emend:  This is an anti-nausea medication that is used with Aloxi to help prevent nausea and vomiting caused by chemotherapy.  Dexamethasone:  This is a steroid given prior to chemotherapy to help prevent allergic reactions; it may also help prevent and control nausea and diarrhea.   Pepcid:  This medication is a histamine blocker that helps prevent and allergic reaction to your chemotherapy.   Benadryl:  This is a histamine blocker (different from the Pepcid) that helps prevent allergic/infusion reactions to your chemotherapy. This medication may cause dizziness/drowsiness.    Paclitaxel (Taxol)  About This Drug  Paclitaxel is a drug used to treat cancer. It is given in the vein (IV).  This will take ** hour to  infuse.  This first infusion will take longer because it is increased slowly to monitor for reactions.  The nurse will be in the room with you for the first 15 minutes of the first infusion.  Possible Side Effects  . Hair loss. Hair loss is often temporary, although with certain medicine, hair loss can sometimes be permanent. Hair loss may happen suddenly or gradually. If you lose hair, you may lose it from your head, face, armpits, pubic area, chest, and/or legs. You may also notice your hair getting thin.  . Swelling of your legs, ankles and/or feet (edema)  . Flushing  . Nausea and throwing up (vomiting)  . Loose bowel movements (diarrhea)  . Bone marrow depression. This is a decrease in the number of white blood cells, red blood cells, and platelets. This may raise your risk of infection, make you tired and weak (fatigue), and raise your risk of bleeding.  . Effects on the nerves are called peripheral neuropathy. You may feel numbness, tingling, or pain in your hands and feet. It may be hard for you to button your clothes, open jars, or walk as usual. The effect on the nerves may get worse with more doses of the drug. These effects get better in some people after the drug is stopped but it does not get better in all people.  . Changes in your liver function  . Bone, joint and muscle pain  . Abnormal EKG  . Allergic reaction: Allergic reactions, including anaphylaxis are rare but may happen in some  patients. Signs of allergic reaction to this drug may be swelling of the face, feeling like your tongue or throat are swelling, trouble breathing, rash, itching, fever, chills, feeling dizzy, and/or feeling that your heart is beating in a fast or not normal way. If this happens, do not take another dose of this drug. You should get urgent medical treatment.  . Infection  . Changes in your kidney function.  Note: Each of the side effects above was reported in 20% or greater of patients  treated with paclitaxel. Not all possible side effects are included above.   Warnings and Precautions  . Severe allergic reactions  . Severe bone marrow depression   Treating Side Effects  . To help with hair loss, wash with a mild shampoo and avoid washing your hair every day.  . Avoid rubbing your scalp, instead, pat your hair or scalp dry  . Avoid coloring your hair  . Limit your use of hair spray, electric curlers, blow dryers, and curling irons.  . If you are interested in getting a wig, talk to your nurse. You can also call the [place] at [phone] to find out information about the "Look Good, Feel Better" program close to where you live. It is a free program where women getting chemotherapy can learn about wigs, turbans and scarves as well as makeup techniques and skin and nail care.  . Ask your doctor or nurse about medicines that are available to help stop or lessen diarrhea and/or nausea.  . To help with nausea and vomiting, eat small, frequent meals instead of three large meals a day. Choose foods and drinks that are at room temperature. Ask your nurse or doctor about other helpful tips and medicine that is available to help or stop lessen these symptoms.  . If you get diarrhea, eat low-fiber foods that are high in protein and calories and avoid foods that can irritate your digestive tracts or lead to cramping. Ask your nurse or doctor about medicine that can lessen or stop your diarrhea.  . Mouth care is very important. Your mouth care should consist of routine, gentle cleaning of your teeth or dentures and rinsing your mouth with a mixture of 1/2 teaspoon of salt in 8 ounces of water or  teaspoon of baking soda in 8 ounces of water. This should be done at least after each meal and at bedtime.  . If you have mouth sores, avoid mouthwash that has alcohol. Also avoid alcohol and smoking because they can bother your mouth and throat.  . Drink plenty of  fluids (a minimum of eight glasses per day is recommended).  . Take your temperature as your doctor or nurse tells you, and whenever you feel like you may have a fever.  . Talk to your doctor or nurse about precautions you can take to avoid infections and bleeding.  . Be careful when cooking, walking, and handling sharp objects and hot liquids.  Food and Drug Interactions  . There are no known interactions of paclitaxel with food.  . This drug may interact with other medicines. Tell your doctor and pharmacist about all the medicines and dietary supplements (vitamins, minerals, herbs and others) that you are taking at this time.  . The safety and use of dietary supplements and alternative diets are often not known. Using these might affect your cancer or interfere with your treatment. Until more is known, you should not use dietary supplements or alternative diets without your cancer doctor's  help.  When to Call the Doctor  Call your doctor or nurse if you have any of the following symptoms and/or any new or unusual symptoms:  . Fever of 100.4 F (38 C) or above  . Chills  . Redness, pain, warmth, or swelling at the IV site during the infusion  . Signs of allergic reaction: swelling of the face, feeling like your tongue or throat are swelling, trouble breathing, rash, itching, fever, chills, feeling dizzy, and/or feeling that your heart is beating in a fast or not normal way  . Feeling that your heart is beating in a fast or not normal way (palpitations)  . Weight gain of 5 pounds in one week (fluid retention)  . Decreased urine or very dark urine  . Signs of liver problems: dark urine, pale bowel movements, bad stomach pain, feeling very tired and weak, unusual  itching, or yellowing of the eyes or skin  . Heavy menstrual period that lasts longer than normal  . Easy bruising or bleeding  . Nausea that stops you from eating or drinking, and/or that is not relieved by prescribed  medicines.  . Loose bowel movements (diarrhea) more than 4 times a day or diarrhea with weakness or lightheadedness  . Pain in your mouth or throat that makes it hard to eat or drink  . Lasting loss of appetite or rapid weight loss of five pounds in a week  . Signs of peripheral neuropathy: numbness, tingling, or decreased feeling in fingers or toes; trouble walking or changes in the way you walk; or feeling clumsy when buttoning clothes, opening jars, or other routine activities  . Joint and muscle pain that is not relieved by prescribed medicines  . Extreme fatigue that interferes with normal activities  . While you are getting this drug, please tell your nurse right away if you have any pain, redness, or swelling at the site of the IV infusion.  . If you think you are pregnant.  Reproduction Warnings  . Pregnancy warning: This drug may have harmful effects on the unborn child, it is recommended that effective methods of birth control should be used during your cancer treatment. Let your doctor know right away if you think you may be pregnant.  . Breast feeding warning: Women should not breast feed during treatment because this drug could enter the breastmilk and cause harm to a breast feeding baby.   Carboplatin (Paraplatin, CBDCA)  About This Drug  Carboplatin is used to treat cancer. It is given in the vein through your port a cath.  It will take 30 minutes to infuse. You will receive this medication every 3 weeks.   Possible Side Effects  . Bone marrow suppression. This is a decrease in the number of white blood cells, red blood cells, and platelets. This may raise your risk of infection, make you tired and weak (fatigue), and raise your risk of bleeding.  . Nausea and vomiting (throwing up)  . Weakness  . Changes in your liver function  . Changes in your kidney function  . Electrolyte changes  . Pain  Note: Each of the side effects above was reported in 20% or  greater of patients treated with carboplatin. Not all possible side effects are included above.   Warnings and Precautions  . Severe bone marrow suppression  . Allergic reactions, including anaphylaxis are rare but may happen in some patients. Signs of allergic reaction to this drug may be swelling of the face, feeling like your  tongue or throat are swelling, trouble breathing, rash, itching, fever, chills, feeling dizzy, and/or feeling that your heart is beating in a fast or not normal way. If this happens, do not take another dose of this drug. You should get urgent medical treatment.  . Severe nausea and vomiting  . Effects on the nerves are called peripheral neuropathy. This risk is increased if you are over the age of 42 or if you have received other medicine with risk of peripheral neuropathy. You may feel numbness, tingling, or pain in your hands and feet. It may be hard for you to button your clothes, open jars, or walk as usual. The effect on the nerves may get worse with more doses of the drug. These effects get better in some people after the drug is stopped but it does not get better in all people.  Marland Kitchen Blurred vision, loss of vision or other changes in eyesight  . Decreased hearing  . Skin and tissue irritation including redness, pain, warmth, or swelling at the IV site if the drug leaks out of the vein and into nearby tissue.  . Severe changes in your kidney function, which can cause kidney failure  . Severe changes in your liver function, which can cause liver failure  Note: Some of the side effects above are very rare. If you have concerns and/or questions, please discuss them with your medical team.  Important Information  . This drug may be present in the saliva, tears, sweat, urine, stool, vomit, semen, and vaginal secretions. Talk to your doctor and/or your nurse about the necessary precautions to take during this time.  Treating Side Effects  . Manage tiredness by  pacing your activities for the day.  . Be sure to include periods of rest between energy-draining activities.  . To decrease the risk of infection, wash your hands regularly.  . Avoid close contact with people who have a cold, the flu, or other infections.  . Take your temperature as your doctor or nurse tells you, and whenever you feel like you may have a fever.  . To help decrease the risk of bleeding, use a soft toothbrush. Check with your nurse before using dental floss.  . Be very careful when using knives or tools.  . Use an electric shaver instead of a razor.  . Drink plenty of fluids (a minimum of eight glasses per day is recommended).  . If you throw up or have loose bowel movements, you should drink more fluids so that you do not become dehydrated (lack of water in the body from losing too much fluid).  . To help with nausea and vomiting, eat small, frequent meals instead of three large meals a day.  Choose foods and drinks that are at room temperature. Ask your nurse or doctor about other helpful tips and medicine that is available to help stop or lessen these symptoms.  . If you have numbness and tingling in your hands and feet, be careful when cooking, walking, and handling sharp objects and hot liquids.  Marland Kitchen Keeping your pain under control is important to your well-being. Please tell your doctor or nurse if you are experiencing pain.  Food and Drug Interactions  . There are no known interactions of carboplatin with food.  . This drug may interact with other medicines. Tell your doctor and pharmacist about all the prescription and over-the-counter medicines and dietary supplements (vitamins, minerals, herbs and others) that you are taking at this time. Also, check with  your doctor or pharmacist before starting any new prescription or over-the-counter medicines, or dietary supplements to make sure that there are no interactions.  When to Call the Doctor  Call your doctor  or nurse if you have any of these symptoms and/or any new or unusual symptoms:  . Fever of 100.4 F (38 C) or higher  . Chills  . Tiredness that interferes with your daily activities  . Feeling dizzy or lightheaded  . Easy bleeding or bruising  . Nausea that stops you from eating or drinking and/or is not relieved by prescribed medicines  . Throwing up/vomiting  . Blurred vision or other changes in eyesight  . Decrease in hearing or ringing in the ear  . Signs of allergic reaction: swelling of the face, feeling like your tongue or throat are swelling, trouble breathing, rash, itching, fever, chills, feeling dizzy, and/or feeling that your heart is beating in a fast or not normal way. If this happens, call 911 for emergency care.  . While you are getting this drug, please tell your nurse right away if you have any pain, redness, or swelling at the site of the IV infusion  . Signs of possible liver problems: dark urine, pale bowel movements, bad stomach pain, feeling very tired and weak, unusual itching, or yellowing of the eyes or skin  . Decreased urine, or very dark urine  . Numbness, tingling, or pain in your hands and feet  . Pain that does not go away or is not relieved by prescribed medicine  . If you think you may be pregnant  Reproduction Warnings  . Pregnancy warning: This drug may have harmful effects on the unborn baby. Women of child bearing potential should use effective methods of birth control during your cancer treatment. Let your doctor know right away if you think you may be pregnant.  . Breastfeeding warning: It is not known if this drug passes into breast milk. For this reason, women should not breastfeed during treatment because this drug could enter the breast milk and cause harm to a breastfeeding baby.  . Fertility warning: Human fertility studies have not been done with this drug. Talk with your doctor or nurse if you plan to have children. Ask for  information on sperm or egg banking.  SELF CARE ACTIVITIES WHILE RECEIVING CHEMOTHERAPY:  Hydration Increase your fluid intake 48 hours prior to treatment and drink at least 8 to 12 cups (64 ounces) of water/decaffeinated beverages per day after treatment. You can still have your cup of coffee or soda but these beverages do not count as part of your 8 to 12 cups that you need to drink daily. No alcohol intake.  Medications Continue taking your normal prescription medication as prescribed.  If you start any new herbal or new supplements please let us know first to make sure it is safe.  Mouth Care Have teeth cleaned professionally before starting treatment. Keep dentures and partial plates clean. Use soft toothbrush and do not use mouthwashes that contain alcohol. Biotene is a good mouthwash that is available at most pharmacies or may be ordered by calling [phone]. Use warm salt water gargles (1 teaspoon salt per 1 quart warm water) before and after meals and at bedtime. If you need dental work, please let the doctor know before you go for your appointment so that we can coordinate the best possible time for you in regards to your chemo regimen. You need to also let your dentist know that you  are actively taking chemo. We may need to do labs prior to your dental appointment.  Skin Care Always use sunscreen that has not expired and with SPF (Sun Protection Factor) of 50 or higher. Wear hats to protect your head from the sun. Remember to use sunscreen on your hands, ears, face, & feet.  Use good moisturizing lotions such as udder cream, eucerin, or even Vaseline. Some chemotherapies can cause dry skin, color changes in your skin and nails.    . Avoid long, hot showers or baths. . Use gentle, fragrance-free soaps and laundry detergent. . Use moisturizers, preferably creams or ointments rather than lotions because the thicker consistency is better at preventing skin dehydration. Apply the cream  or ointment within 15 minutes of showering. Reapply moisturizer at night, and moisturize your hands every time after you wash them.  Hair Loss (if your doctor says your hair will fall out)  . If your doctor says that your hair is likely to fall out, decide before you begin chemo whether you want to wear a wig. You may want to shop before treatment to match your hair color. . Hats, turbans, and scarves can also camouflage hair loss, although some people prefer to leave their heads uncovered. If you go bare-headed outdoors, be sure to use sunscreen on your scalp. . Cut your hair short. It eases the inconvenience of shedding lots of hair, but it also can reduce the emotional impact of watching your hair fall out. . Don't perm or color your hair during chemotherapy. Those chemical treatments are already damaging to hair and can enhance hair loss. Once your chemo treatments are done and your hair has grown back, it's OK to resume dyeing or perming hair.  With chemotherapy, hair loss is almost always temporary. But when it grows back, it may be a different color or texture. In older adults who still had hair color before chemotherapy, the new growth may be completely gray.  Often, new hair is very fine and soft.  Infection Prevention Please wash your hands for at least 30 seconds using warm soapy water. Handwashing is the #1 way to prevent the spread of germs. Stay away from sick people or people who are getting over a cold. If you develop respiratory systems such as green/yellow mucus production or productive cough or persistent cough let us know and we will see if you need an antibiotic. It is a good idea to keep a pair of gloves on when going into grocery stores/[employer] to decrease your risk of coming into contact with germs on the carts, etc. Carry alcohol hand gel with you at all times and use it frequently if out in public. If your temperature reaches 100.5 or higher please call the clinic and let us  know.  If it is after hours or on the weekend please go to the ER if your temperature is over 100.5.  Please have your own personal thermometer at home to use.    Sex and bodily fluids If you are going to have sex, a condom must be used to protect the person that isn't taking chemotherapy. Chemo can decrease your libido (sex drive). For a few days after chemotherapy, chemotherapy can be excreted through your bodily fluids.  When using the toilet please close the lid and flush the toilet twice.  Do this for a few day after you have had chemotherapy.   Effects of chemotherapy on your sex life Some changes are simple and won't last long. They  won't affect your sex life permanently.  Sometimes you may feel: . too tired . not strong enough to be very active . sick or sore  . not in the mood . anxious or low Your anxiety might not seem related to sex. For example, you may be worried about the cancer and how your treatment is going. Or you may be worried about money, or about how you family are coping with your illness.  These things can cause stress, which can affect your interest in sex. It's important to talk to your partner about how you feel.  Remember - the changes to your sex life don't usually last long. There's usually no medical reason to stop having sex during chemo. The drugs won't have any long term physical effects on your performance or enjoyment of sex. Cancer can't be passed on to your partner during sex  Contraception It's important to use reliable contraception during treatment. Avoid getting pregnant while you or your partner are having chemotherapy. This is because the drugs may harm the baby. Sometimes chemotherapy drugs can leave a man or woman infertile.  This means you would not be able to have children in the future. You might want to talk to someone about permanent infertility. It can be very difficult to learn that you may no longer be able to have children. Some people find  counselling helpful. There might be ways to preserve your fertility, although this is easier for men than for women. You may want to speak to a fertility expert. You can talk about sperm banking or harvesting your eggs. You can also ask about other fertility options, such as donor eggs. If you have or have had breast cancer, your doctor might advise you not to take the contraceptive pill. This is because the hormones in it might affect the cancer. It is not known for sure whether or not chemotherapy drugs can be passed on through semen or secretions from the vagina. Because of this some doctors advise people to use a barrier method if you have sex during treatment. This applies to vaginal, anal or oral sex. Generally, doctors advise a barrier method only for the time you are actually having the treatment and for about a week after your treatment. Advice like this can be worrying, but this does not mean that you have to avoid being intimate with your partner. You can still have close contact with your partner and continue to enjoy sex.  Animals If you have cats or birds we just ask that you not change the litter or change the cage.  Please have someone else do this for you while you are on chemotherapy.   Food Safety During and After Cancer Treatment Food safety is important for people both during and after cancer treatment. Cancer and cancer treatments, such as chemotherapy, radiation therapy, and stem cell/bone marrow transplantation, often weaken the immune system. This makes it harder for your body to protect itself from foodborne illness, also called food poisoning. Foodborne illness is caused by eating food that contains harmful bacteria, parasites, or viruses.  Foods to avoid Some foods have a higher risk of becoming tainted with bacteria. These include: Marland Kitchen Unwashed fresh fruit and vegetables, especially leafy vegetables that can hide dirt and other contaminants . Raw sprouts, such as alfalfa  sprouts . Raw or undercooked beef, especially ground beef, or other raw or undercooked meat and poultry . Fatty, fried, or spicy foods immediately before or after treatment.  These can sit  heavy on your stomach and make you feel nauseous. . Raw or undercooked shellfish, such as oysters. . Sushi and sashimi, which often contain raw fish.  . Unpasteurized beverages, such as unpasteurized fruit juices, raw milk, raw yogurt, or cider . Undercooked eggs, such as soft boiled, over easy, and poached; raw, unpasteurized eggs; or foods made with raw egg, such as homemade raw cookie dough and homemade mayonnaise  Simple steps for food safety  Shop smart. . Do not buy food stored or displayed in an unclean area. . Do not buy bruised or damaged fruits or vegetables. . Do not buy cans that have cracks, dents, or bulges. . Pick up foods that can spoil at the end of your shopping trip and store them in a cooler on the way home.  Prepare and clean up foods carefully. . Rinse all fresh fruits and vegetables under running water, and dry them with a clean towel or paper towel. . Clean the top of cans before opening them. . After preparing food, wash your hands for 20 seconds with hot water and soap. Pay special attention to areas between fingers and under nails. . Clean your utensils and dishes with hot water and soap. Marland Kitchen Disinfect your kitchen and cutting boards using 1 teaspoon of liquid, unscented bleach mixed into 1 quart of water.    Dispose of old food. . Eat canned and packaged food before its expiration date (the "use by" or "best before" date). . Consume refrigerated leftovers within 3 to 4 days. After that time, throw out the food. Even if the food does not smell or look spoiled, it still may be unsafe. Some bacteria, such as Listeria, can grow even on foods stored in the refrigerator if they are kept for too long.  Take precautions when eating out. . At restaurants, avoid buffets and salad bars  where food sits out for a long time and comes in contact with many people. Food can become contaminated when someone with a virus, often a norovirus, or another "bug" handles it. . Put any leftover food in a "to-go" container yourself, rather than having the server do it. And, refrigerate leftovers as soon as you get home. . Choose restaurants that are clean and that are willing to prepare your food as you order it cooked.   AT HOME MEDICATIONS:                                                                                                                                                                Compazine/Prochlorperazine 10mg  tablet. Take 1 tablet every 6 hours as needed for nausea/vomiting. (This can make you sleepy)   EMLA cream. Apply a quarter size amount to port site 1 hour prior to chemo. Do not rub in. Cover  with plastic wrap.    Diarrhea Sheet   If you are having loose stools/diarrhea, please purchase Imodium and begin taking as outlined:  At the first sign of poorly formed or loose stools you should begin taking Imodium (loperamide) 2 mg capsules.  Take two tablets (4mg ) followed by one tablet (2mg ) every 2 hours - DO NOT EXCEED 8 tablets in 24 hours.  If it is bedtime and you are having loose stools, take 2 tablets at bedtime, then 2 tablets every 4 hours until morning.   Always call the [facility] if you are having loose stools/diarrhea that you can't get under control.  Loose stools/diarrhea leads to dehydration (loss of water) in your body.  We have other options of trying to get the loose stools/diarrhea to stop but you must let us know!   Constipation Sheet  Colace - 100 mg capsules - take 2 capsules daily.  If this doesn't help then you can increase to 2 capsules twice daily.  Please call if the above does not work for you. Do not go more than 2 days without a bowel movement.  It is very important that you do not become constipated.  It will make you feel sick to  your stomach (nausea) and can cause abdominal pain and vomiting.  Nausea Sheet   Compazine/Prochlorperazine 10mg  tablet. Take 1 tablet every 6 hours as needed for nausea/vomiting (This can make you drowsy).  If you are having persistent nausea (nausea that does not stop) please call the [facility] and let us know the amount of nausea that you are experiencing.  If you begin to vomit, you need to call the [facility] and if it is the weekend and you have vomited more than one time and can't get it to stop-go to the Emergency Room.  Persistent nausea/vomiting can lead to dehydration (loss of fluid in your body) and will make you feel very weak and unwell. Ice chips, sips of clear liquids, foods that are at room temperature, crackers, and toast tend to be better tolerated.   SYMPTOMS TO REPORT AS SOON AS POSSIBLE AFTER TREATMENT:  FEVER GREATER THAN 100.5 F  CHILLS WITH OR WITHOUT FEVER  NAUSEA AND VOMITING THAT IS NOT CONTROLLED WITH YOUR NAUSEA MEDICATION  UNUSUAL SHORTNESS OF BREATH  UNUSUAL BRUISING OR BLEEDING  TENDERNESS IN MOUTH AND THROAT WITH OR WITHOUT   PRESENCE OF ULCERS  URINARY PROBLEMS  BOWEL PROBLEMS  UNUSUAL RASH    Wear comfortable clothing and clothing appropriate for easy access to any Portacath or PICC line. Let us know if there is anything that we can do to make your therapy better!   What to do if you need assistance after hours or on the weekends: CALL [phone].  HOLD on the line, do not hang up.  You will hear multiple messages but at the end you will be connected with a nurse triage line.  They will contact the doctor if necessary.  Most of the time they will be able to assist you.  Do not call the hospital operator.     I have been informed and understand all of the instructions given to me and have received a copy. I have been instructed to call the clinic [phone] or my family physician as soon as possible for continued medical care, if  indicated. I do not have any more questions at this time but understand that I may call the [facility] or the Patient Navigator at [phone]  during office hours should I have questions or need assistance in obtaining follow-up care.

## 2019-06-07 NOTE — Progress Notes (Signed)
START ON PATHWAY REGIMEN - Gastroesophageal     Administer weekly during RT:     Paclitaxel      Carboplatin   **Always confirm dose/schedule in your pharmacy ordering system**  Patient Characteristics: Esophageal & GE Junction, Adenocarcinoma, Preoperative or Nonsurgical Candidate (Clinical Staging), cT2 or Higher or cN+, Surgical Candidate (Up to cT4a) - Preoperative Therapy, GE Junction Histology: Adenocarcinoma Disease Classification: GE Junction Therapeutic Status: Preoperative or Nonsurgical Candidate (Clinical Staging) AJCC Grade: G2 AJCC 8 Stage Grouping: Unknown AJCC T Category: cTX AJCC N Category: cN2 AJCC M Category: cM0 Intent of Therapy: Curative Intent, Discussed with Patient

## 2019-06-07 NOTE — Progress Notes (Signed)
[facility] [place], [place] [address]   CLINIC:  Medical Oncology/Hematology  PCP:  Glenda Chroman, MD [place] [address] [phone]   REASON FOR VISIT:  Follow-up for GE junction adenocarcinoma.  CURRENT THERAPY: Chemoradiation therapy.  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 male seen for follow-up of GE junction adenocarcinoma.  He was evaluated by Dr. Kipp Brood.  He was also seen by Dr.Yanagihara in [place].  He is continuing about 6 cartons of Osmolite 1.5 continuously from a 3 PM to 7 AM.  He gained about 2 pounds since last visit.  Appetite and energy levels are 75%.  He is still unable to swallow his sputum.  He has intermittent nausea.    REVIEW OF SYSTEMS:  Review of Systems  HENT:   Positive for trouble swallowing.   Gastrointestinal: Positive for nausea.  Neurological: Positive for numbness.  All other systems reviewed and are negative.    PAST MEDICAL/SURGICAL  HISTORY:  Past Medical History:  Diagnosis Date  . Anxiety   . Arthritis   . COPD (chronic obstructive pulmonary disease) ([place])   . Diabetes mellitus without complication ([place])   . Diabetic retinopathy ([place])    NPDR OU  . Esophageal cancer ([place])   . GERD (gastroesophageal reflux disease)   . Gout   . Hypertension   . Hypertensive retinopathy    OU  . Port-A-Cath in place 06/07/2019   Past Surgical History:  Procedure Laterality Date  . BIOPSY  05/10/2019   Procedure: BIOPSY;  Surgeon: Danie Binder, MD;  Location: [facility];  Service: Endoscopy;;  gastric esophageal  . CATARACT EXTRACTION W/PHACO Left 04/03/2016   Procedure: CATARACT EXTRACTION PHACO AND INTRAOCULAR LENS PLACEMENT LEFT EYE CDE= 11.33;  Surgeon: Tonny Branch, MD;  Location: [facility] ORS;  Service: Ophthalmology;  Laterality: Left;  left  . CATARACT EXTRACTION W/PHACO Right 04/28/2016   Procedure: CATARACT EXTRACTION PHACO AND INTRAOCULAR LENS PLACEMENT (IOC);  Surgeon: Tonny Branch, MD;  Location: [facility] ORS;  Service: Ophthalmology;  Laterality: Right;  cde-10.23   . ESOPHAGOGASTRODUODENOSCOPY (EGD) WITH PROPOFOL N/A 05/10/2019   Procedure: ESOPHAGOGASTRODUODENOSCOPY (EGD) WITH PROPOFOL with dilation as appropriate.;  Surgeon: Danie Binder, MD;  Location: [facility];  Service: Endoscopy;  Laterality: N/A;  . EYE SURGERY Bilateral    Cat Sx OU  . JEJUNOSTOMY N/A 05/13/2019   Procedure: JEJUNOSTOMY FEEDING TUBE PLACEMENT (Procedure #2);  Surgeon: Aviva Signs, MD;  Location: [facility] ORS;  Service: General;  Laterality: N/A;  .  KNEE ARTHROSCOPY Right   . NECK SURGERY    . PORTACATH PLACEMENT Right 05/13/2019   Procedure: INSERTION PORT-A-CATH (attached catheter in right subclavian) (Procedure #1);  Surgeon: Aviva Signs, MD;  Location: [facility] ORS;  Service: General;  Laterality: Right;  . ROTATOR CUFF REPAIR Right      SOCIAL HISTORY:  Social History   Socioeconomic History  . Marital status: Married    Spouse name: Not on  file  . Number of children: 0  . Years of education: Not on file  . Highest education level: Not on file  Occupational History  . Occupation: retired  Tobacco Use  . Smoking status: Former Smoker    Types: Cigarettes    Quit date: 03/26/1989    Years since quitting: 30.2  . Smokeless tobacco: Never Used  Substance and Sexual Activity  . Alcohol use: No  . Drug use: No  . Sexual activity: Not Currently  Other Topics Concern  . Not on file  Social History Narrative  . Not on file   Social Determinants of Health   Financial Resource Strain: Low Risk   . Difficulty of Paying Living Expenses: Not hard at all  Food Insecurity: No Food Insecurity  . Worried About Charity fundraiser in the Last Year: Never true  . Ran Out of Food in the Last Year: Never true  Transportation Needs: No Transportation Needs  . Lack of Transportation (Medical): No  . Lack of Transportation (Non-Medical): No  Physical Activity: Inactive  . Days of Exercise per Week: 0 days  . Minutes of Exercise per Session: 0 min  Stress: No Stress Concern Present  . Feeling of Stress : Only a little  Social Connections: Not Isolated  . Frequency of Communication with Friends and Family: Three times a week  . Frequency of Social Gatherings with Friends and Family: Three times a week  . Attends Religious Services: More than 4 times per year  . Active Member of Clubs or Organizations: No  . Attends Archivist Meetings: More than 4 times per year  . Marital Status: Married  Human resources officer Violence: Not At Risk  . Fear of Current or Ex-Partner: No  . Emotionally Abused: No  . Physically Abused: No  . Sexually Abused: No    FAMILY HISTORY:  Family History  Adopted: Yes  Problem Relation Age of Onset  . Hypertension Brother   . Kidney disease Brother     CURRENT MEDICATIONS:  Outpatient Encounter Medications as of 06/07/2019  Medication Sig  . ALPRAZolam (XANAX) 0.5 MG tablet 1 tablet (0.5 mg  total) by Per J Tube route 2 (two) times daily.  . Amino Acids-Protein Hydrolys (FEEDING SUPPLEMENT, PRO-STAT SUGAR FREE 64,) LIQD 30 mLs by Per J Tube route 2 (two) times daily.  Marland Kitchen atorvastatin (LIPITOR) 40 MG tablet 1 tablet (40 mg total) by Per J Tube route every evening. for cholesterol  . famotidine (PEPCID) 20 MG tablet Place 1 tablet (20 mg total) into feeding tube daily.  Marland Kitchen FLUoxetine (PROZAC) 20 MG capsule Place 1 capsule (20 mg total) into feeding tube daily.  Marland Kitchen glipiZIDE (GLUCOTROL) 10 MG tablet Place 1 tablet (10 mg total) into feeding tube 2 (two) times daily before a meal.  . metFORMIN (GLUCOPHAGE) 1000 MG tablet 1 tablet (1,000 mg total) by Per J Tube route 2 (two) times daily.  . Nutritional Supplements (FEEDING SUPPLEMENT, OSMOLITE 1.5 CAL,) LIQD Place 1,000 mLs into feeding tube continuous.  . pantoprazole sodium (  PROTONIX) 40 mg/20 mL PACK Place 20 mLs (40 mg total) into feeding tube daily.  Marland Kitchen terazosin (HYTRIN) 10 MG capsule 1 capsule (10 mg total) by Per J Tube route daily.  . Water For Irrigation, Sterile (FREE WATER) SOLN Place 300 mLs into feeding tube every 8 (eight) hours.  Marland Kitchen acetaminophen (TYLENOL) 325 MG tablet Place 2 tablets (650 mg total) into feeding tube every 6 (six) hours as needed for mild pain (or Fever >/= 101). (Patient not taking: Reported on 06/07/2019)  . albuterol (PROVENTIL HFA;VENTOLIN HFA) 108 (90 Base) MCG/ACT inhaler Inhale 2 puffs into the lungs every 4 (four) hours as needed for wheezing or shortness of breath.  Marland Kitchen albuterol (PROVENTIL) (2.5 MG/3ML) 0.083% nebulizer solution Take 2.5 mg by nebulization every 6 (six) hours as needed for wheezing or shortness of breath.  . ondansetron (ZOFRAN-ODT) 4 MG disintegrating tablet Take 4 mg by mouth every 6 (six) hours as needed.  . promethazine (PHENERGAN) 25 MG suppository Place 25 mg rectally every 6 (six) hours as needed.   No facility-administered encounter medications on file as of 06/07/2019.     ALLERGIES:  Allergies  Allergen Reactions  . Eggs Or Egg-Derived Products Nausea And Vomiting     PHYSICAL EXAM:  ECOG Performance status: 1  Vitals:   06/07/19 0911  BP: 112/65  Pulse: 89  Resp: 18  Temp: (!) 96.4 F (35.8 C)  SpO2: 98%   Filed Weights   06/07/19 0911  Weight: 191 lb (86.6 kg)    Physical Exam Vitals reviewed.  Constitutional:      Appearance: Normal appearance.  Cardiovascular:     Rate and Rhythm: Normal rate and regular rhythm.     Heart sounds: Normal heart sounds.  Pulmonary:     Effort: Pulmonary effort is normal.     Breath sounds: Normal breath sounds.  Abdominal:     Palpations: Abdomen is soft.  Skin:    General: Skin is warm.  Neurological:     General: No focal deficit present.     Mental Status: He is alert and oriented to person, place, and time.  Psychiatric:        Mood and Affect: Mood normal.        Behavior: Behavior normal.      LABORATORY DATA:  I have reviewed the labs as listed.  CBC    Component Value Date/Time   WBC 5.5 06/07/2019 0846   RBC 4.11 (L) 06/07/2019 0846   HGB 12.2 (L) 06/07/2019 0846   HCT 37.0 (L) 06/07/2019 0846   PLT 134 (L) 06/07/2019 0846   MCV 90.0 06/07/2019 0846   MCH 29.7 06/07/2019 0846   MCHC 33.0 06/07/2019 0846   RDW 14.0 06/07/2019 0846   LYMPHSABS 1.5 06/07/2019 0846   MONOABS 0.4 06/07/2019 0846   EOSABS 0.1 06/07/2019 0846   BASOSABS 0.0 06/07/2019 0846   CMP Latest Ref Rng & Units 06/07/2019 05/16/2019 05/15/2019  Glucose 70 - 99 mg/dL 166(H) 278(H) 283(H)  BUN 8 - 23 mg/dL 17 15 11   Creatinine 0.61 - 1.24 mg/dL 0.71 0.64 0.75  Sodium 135 - 145 mmol/L 135 141 139  Potassium 3.5 - 5.1 mmol/L 4.3 3.3(L) 3.5  Chloride 98 - 111 mmol/L 96(L) 104 102  CO2 22 - 32 mmol/L 29 27 26   Calcium 8.9 - 10.3 mg/dL 9.1 8.0(L) 8.1(L)  Total Protein 6.5 - 8.1 g/dL 6.7 - -  Total Bilirubin 0.3 - 1.2 mg/dL 0.6 - -  Alkaline Phos 38 -  126 U/L 93 - -  AST 15 - 41 U/L 28 - -  ALT 0 - 44  U/L 52(H) - -       DIAGNOSTIC IMAGING:  I have independently reviewed the scans and discussed with the patient.     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  GE junction adenocarcinoma (TX N2 M0): -PET scan on 05/23/2019 showed hypermetabolic distal esophageal mass.  17 mm short axis gastrohepatic node and additional small upper abdominal nodes including 8 mm short axis right celiac node, 8 mm short axis left para-aortic node. -I have communicated with Dr. Kipp Brood.  Patient is a candidate for surgery after chemoradiation therapy. -Patient was also evaluated by Dr.Yanagihara in [place]. -I have recommended weekly chemotherapy with carboplatin and paclitaxel. -We have discussed chemotherapy drugs, administration, side effects in detail.  He already has a port placed. -We will tentatively target to start his treatment next Wednesday.  All his questions were answered to his satisfaction.  2.  Weight loss: -He lost 30 pounds in the last 2 to 3 months. -J-tube was placed.  He is taking in 6 cartons of Osmolite 1.5 from 3 PM to 7 AM continuously. -He will start Protostat 30 mL twice daily. -He gained 2 pounds since last visit.  He is unable to swallow even his sputum.  3.  Peripheral neuropathy: -He had occasional tingling or numbness in the hands and feet from his diabetes.  He was on gabapentin until discharge from the hospital.  4.  LFT elevation: -He had elevated ALT of 52.  We will keep a close eye on it.  PET scan did not show any liver metastasis.      Orders placed this encounter:  No orders of the defined types were placed in this encounter.  Total time spent is 40 minutes with more than 50% of the time spent face-to-face discussing treatment plan, side effects, counseling and coordination of care.   Derek Jack, MD [place] [phone]

## 2019-06-07 NOTE — Assessment & Plan Note (Signed)
1.  GE junction adenocarcinoma (TX N2 M0): -PET scan on 05/23/2019 showed hypermetabolic distal esophageal mass.  17 mm short axis gastrohepatic node and additional small upper abdominal nodes including 8 mm short axis right celiac node, 8 mm short axis left para-aortic node. -I have communicated with Dr. Kipp Brood.  Patient is a candidate for surgery after chemoradiation therapy. -Patient was also evaluated by Dr.Yanagihara in [place]. -I have recommended weekly chemotherapy with carboplatin and paclitaxel. -We have discussed chemotherapy drugs, administration, side effects in detail.  He already has a port placed. -We will tentatively target to start his treatment next Wednesday.  All his questions were answered to his satisfaction.  2.  Weight loss: -He lost 30 pounds in the last 2 to 3 months. -J-tube was placed.  He is taking in 6 cartons of Osmolite 1.5 from 3 PM to 7 AM continuously. -He will start Protostat 30 mL twice daily. -He gained 2 pounds since last visit.  He is unable to swallow even his sputum.  3.  Peripheral neuropathy: -He had occasional tingling or numbness in the hands and feet from his diabetes.  He was on gabapentin until discharge from the hospital.  4.  LFT elevation: -He had elevated ALT of 52.  We will keep a close eye on it.  PET scan did not show any liver metastasis.

## 2019-06-09 ENCOUNTER — Other Ambulatory Visit: Payer: Self-pay

## 2019-06-09 ENCOUNTER — Inpatient Hospital Stay (HOSPITAL_COMMUNITY): Payer: Medicare Other

## 2019-06-09 DIAGNOSIS — C159 Malignant neoplasm of esophagus, unspecified: Secondary | ICD-10-CM

## 2019-06-09 DIAGNOSIS — Z95828 Presence of other vascular implants and grafts: Secondary | ICD-10-CM

## 2019-06-09 NOTE — Progress Notes (Signed)

## 2019-06-10 ENCOUNTER — Ambulatory Visit (HOSPITAL_COMMUNITY): Payer: Medicare Other

## 2019-06-10 DIAGNOSIS — I10 Essential (primary) hypertension: Secondary | ICD-10-CM | POA: Diagnosis not present

## 2019-06-10 DIAGNOSIS — Z934 Other artificial openings of gastrointestinal tract status: Secondary | ICD-10-CM | POA: Diagnosis not present

## 2019-06-10 DIAGNOSIS — Z483 Aftercare following surgery for neoplasm: Secondary | ICD-10-CM | POA: Diagnosis not present

## 2019-06-10 DIAGNOSIS — J449 Chronic obstructive pulmonary disease, unspecified: Secondary | ICD-10-CM | POA: Diagnosis not present

## 2019-06-10 DIAGNOSIS — C159 Malignant neoplasm of esophagus, unspecified: Secondary | ICD-10-CM | POA: Diagnosis not present

## 2019-06-10 DIAGNOSIS — Z452 Encounter for adjustment and management of vascular access device: Secondary | ICD-10-CM | POA: Diagnosis not present

## 2019-06-10 NOTE — Progress Notes (Signed)
Nutrition Follow-up:  Patient with esophageal cancer.  Patient to start concurrent chemotherapy and radiation on 3/10.  Possible surgery following treatment.    Called and spoke with wife, Jana Half for nutrition follow-up.  Wife reports patient continues to tolerate 6 cartons of osmolite 1.5 via continuous pump (34ml/hr for 16 hours 3pm-7 am).  Wife using 2, 16.9 oz bottles of water (1048ml) daily for flushing.  Wife has started prosource giving 35ml daily (mixing with water and flushing before and after).  Reports that patient is tolerating well.  Reports one episode of dry heaves.  No diarrhea but stool is loose and occurring about 2 times per day.    Wife reports home PT has stopped but patient will continue to do exercises 2 times per day.    Medications: reviewed  Labs: reviewed  Anthropometrics:   Weight increased to 191 lb from 189 lb 3.2 oz on 2/23  UBW 220 lb   Estimated Energy Needs  Kcals: 2150-2580 Protein: 107-129 g Fluid: > 2 L  NUTRITION DIAGNOSIS: Inadequate oral intake continues but relying on feeding tube for nutrition   INTERVENTION:  Continue osmolite 1.5 at 94ml/hr for 16 hours (3pm-7am) using 6 bottles per day.  Wife flushing with 1072ml water during the day.   Continue prosource 16ml q daily for additional protein to better meet nutritional needs. RD received samples of promod and will leave for wife to pick up on 3/10 as additional protein modular.  Tube feeding regimen will provide 2330 calories, 109 g protein and 2240 ml free water.  Wife has contact information    MONITORING, EVALUATION, GOAL: Patient will utilize feeding tube to maintain weight during treatment   NEXT VISIT: March 12 phone f/u.    Dajiah Kooi B. Zenia Resides, [place], [place] Registered Dietitian [phone] (pager)

## 2019-06-13 ENCOUNTER — Other Ambulatory Visit (HOSPITAL_COMMUNITY): Payer: Medicare Other

## 2019-06-13 ENCOUNTER — Ambulatory Visit (HOSPITAL_COMMUNITY): Payer: Medicare Other | Admitting: Hematology

## 2019-06-13 ENCOUNTER — Ambulatory Visit (HOSPITAL_COMMUNITY): Payer: Medicare Other

## 2019-06-13 DIAGNOSIS — C155 Malignant neoplasm of lower third of esophagus: Secondary | ICD-10-CM | POA: Diagnosis not present

## 2019-06-14 ENCOUNTER — Encounter (INDEPENDENT_AMBULATORY_CARE_PROVIDER_SITE_OTHER): Payer: Medicare Other | Admitting: Ophthalmology

## 2019-06-15 ENCOUNTER — Other Ambulatory Visit: Payer: Self-pay

## 2019-06-15 ENCOUNTER — Inpatient Hospital Stay (HOSPITAL_BASED_OUTPATIENT_CLINIC_OR_DEPARTMENT_OTHER): Payer: Medicare Other | Admitting: Hematology

## 2019-06-15 ENCOUNTER — Inpatient Hospital Stay (HOSPITAL_COMMUNITY): Payer: Medicare Other

## 2019-06-15 ENCOUNTER — Other Ambulatory Visit (HOSPITAL_COMMUNITY): Payer: Self-pay | Admitting: *Deleted

## 2019-06-15 ENCOUNTER — Encounter (HOSPITAL_COMMUNITY): Payer: Self-pay | Admitting: Hematology

## 2019-06-15 VITALS — BP 137/73 | HR 89 | Temp 97.3°F | Resp 16

## 2019-06-15 DIAGNOSIS — E11319 Type 2 diabetes mellitus with unspecified diabetic retinopathy without macular edema: Secondary | ICD-10-CM | POA: Diagnosis not present

## 2019-06-15 DIAGNOSIS — E871 Hypo-osmolality and hyponatremia: Secondary | ICD-10-CM | POA: Diagnosis not present

## 2019-06-15 DIAGNOSIS — F329 Major depressive disorder, single episode, unspecified: Secondary | ICD-10-CM | POA: Diagnosis not present

## 2019-06-15 DIAGNOSIS — E785 Hyperlipidemia, unspecified: Secondary | ICD-10-CM | POA: Diagnosis not present

## 2019-06-15 DIAGNOSIS — C155 Malignant neoplasm of lower third of esophagus: Secondary | ICD-10-CM | POA: Diagnosis not present

## 2019-06-15 DIAGNOSIS — C159 Malignant neoplasm of esophagus, unspecified: Secondary | ICD-10-CM

## 2019-06-15 DIAGNOSIS — R634 Abnormal weight loss: Secondary | ICD-10-CM | POA: Diagnosis not present

## 2019-06-15 DIAGNOSIS — Z87891 Personal history of nicotine dependence: Secondary | ICD-10-CM | POA: Diagnosis not present

## 2019-06-15 DIAGNOSIS — Z452 Encounter for adjustment and management of vascular access device: Secondary | ICD-10-CM | POA: Diagnosis not present

## 2019-06-15 DIAGNOSIS — K219 Gastro-esophageal reflux disease without esophagitis: Secondary | ICD-10-CM | POA: Diagnosis not present

## 2019-06-15 DIAGNOSIS — I10 Essential (primary) hypertension: Secondary | ICD-10-CM | POA: Diagnosis not present

## 2019-06-15 DIAGNOSIS — Z483 Aftercare following surgery for neoplasm: Secondary | ICD-10-CM | POA: Diagnosis not present

## 2019-06-15 DIAGNOSIS — J449 Chronic obstructive pulmonary disease, unspecified: Secondary | ICD-10-CM | POA: Diagnosis not present

## 2019-06-15 DIAGNOSIS — M109 Gout, unspecified: Secondary | ICD-10-CM | POA: Diagnosis not present

## 2019-06-15 DIAGNOSIS — C160 Malignant neoplasm of cardia: Secondary | ICD-10-CM | POA: Diagnosis not present

## 2019-06-15 DIAGNOSIS — H35039 Hypertensive retinopathy, unspecified eye: Secondary | ICD-10-CM | POA: Diagnosis not present

## 2019-06-15 DIAGNOSIS — Z95828 Presence of other vascular implants and grafts: Secondary | ICD-10-CM

## 2019-06-15 DIAGNOSIS — Z7984 Long term (current) use of oral hypoglycemic drugs: Secondary | ICD-10-CM | POA: Diagnosis not present

## 2019-06-15 DIAGNOSIS — F419 Anxiety disorder, unspecified: Secondary | ICD-10-CM | POA: Diagnosis not present

## 2019-06-15 DIAGNOSIS — G629 Polyneuropathy, unspecified: Secondary | ICD-10-CM | POA: Diagnosis not present

## 2019-06-15 DIAGNOSIS — Z934 Other artificial openings of gastrointestinal tract status: Secondary | ICD-10-CM | POA: Diagnosis not present

## 2019-06-15 DIAGNOSIS — K449 Diaphragmatic hernia without obstruction or gangrene: Secondary | ICD-10-CM | POA: Diagnosis not present

## 2019-06-15 DIAGNOSIS — Z5111 Encounter for antineoplastic chemotherapy: Secondary | ICD-10-CM | POA: Diagnosis not present

## 2019-06-15 LAB — CBC WITH DIFFERENTIAL/PLATELET
Abs Immature Granulocytes: 0.01 10*3/uL (ref 0.00–0.07)
Basophils Absolute: 0 10*3/uL (ref 0.0–0.1)
Basophils Relative: 0 %
Eosinophils Absolute: 0.1 10*3/uL (ref 0.0–0.5)
Eosinophils Relative: 3 %
HCT: 34 % — ABNORMAL LOW (ref 39.0–52.0)
Hemoglobin: 11.4 g/dL — ABNORMAL LOW (ref 13.0–17.0)
Immature Granulocytes: 0 %
Lymphocytes Relative: 28 %
Lymphs Abs: 1.4 10*3/uL (ref 0.7–4.0)
MCH: 30 pg (ref 26.0–34.0)
MCHC: 33.5 g/dL (ref 30.0–36.0)
MCV: 89.5 fL (ref 80.0–100.0)
Monocytes Absolute: 0.4 10*3/uL (ref 0.1–1.0)
Monocytes Relative: 7 %
Neutro Abs: 3.2 10*3/uL (ref 1.7–7.7)
Neutrophils Relative %: 62 %
Platelets: 143 10*3/uL — ABNORMAL LOW (ref 150–400)
RBC: 3.8 MIL/uL — ABNORMAL LOW (ref 4.22–5.81)
RDW: 14.2 % (ref 11.5–15.5)
WBC: 5.2 10*3/uL (ref 4.0–10.5)
nRBC: 0 % (ref 0.0–0.2)

## 2019-06-15 LAB — COMPREHENSIVE METABOLIC PANEL
ALT: 80 U/L — ABNORMAL HIGH (ref 0–44)
AST: 53 U/L — ABNORMAL HIGH (ref 15–41)
Albumin: 3.4 g/dL — ABNORMAL LOW (ref 3.5–5.0)
Alkaline Phosphatase: 96 U/L (ref 38–126)
Anion gap: 9 (ref 5–15)
BUN: 18 mg/dL (ref 8–23)
CO2: 26 mmol/L (ref 22–32)
Calcium: 8.6 mg/dL — ABNORMAL LOW (ref 8.9–10.3)
Chloride: 98 mmol/L (ref 98–111)
Creatinine, Ser: 0.6 mg/dL — ABNORMAL LOW (ref 0.61–1.24)
GFR calc Af Amer: >60 mL/min (ref >60)
GFR calc non Af Amer: >60 mL/min (ref >60)
Glucose, Bld: 148 mg/dL — ABNORMAL HIGH (ref 70–99)
Potassium: 4 mmol/L (ref 3.5–5.1)
Sodium: 133 mmol/L — ABNORMAL LOW (ref 135–145)
Total Bilirubin: 0.7 mg/dL (ref 0.3–1.2)
Total Protein: 6.3 g/dL — ABNORMAL LOW (ref 6.5–8.1)

## 2019-06-15 MED ORDER — SODIUM CHLORIDE 0.9 % IV SOLN
20.0000 mg | Freq: Once | INTRAVENOUS | Status: AC
  Administered 2019-06-15: 20 mg via INTRAVENOUS
  Filled 2019-06-15: qty 2

## 2019-06-15 MED ORDER — FAMOTIDINE IN NACL 20-0.9 MG/50ML-% IV SOLN
20.0000 mg | Freq: Once | INTRAVENOUS | Status: AC
  Administered 2019-06-15: 20 mg via INTRAVENOUS
  Filled 2019-06-15: qty 50

## 2019-06-15 MED ORDER — SODIUM CHLORIDE 0.9 % IV SOLN
50.0000 mg/m2 | Freq: Once | INTRAVENOUS | Status: AC
  Administered 2019-06-15: 108 mg via INTRAVENOUS
  Filled 2019-06-15: qty 18

## 2019-06-15 MED ORDER — HEPARIN SOD (PORK) LOCK FLUSH 100 UNIT/ML IV SOLN
500.0000 [IU] | Freq: Once | INTRAVENOUS | Status: AC | PRN
  Administered 2019-06-15: 500 [IU]

## 2019-06-15 MED ORDER — DIPHENHYDRAMINE HCL 50 MG/ML IJ SOLN
50.0000 mg | Freq: Once | INTRAMUSCULAR | Status: AC
  Administered 2019-06-15: 50 mg via INTRAVENOUS
  Filled 2019-06-15: qty 1

## 2019-06-15 MED ORDER — SODIUM CHLORIDE 0.9 % IV SOLN: Freq: Once | INTRAVENOUS | Status: AC

## 2019-06-15 MED ORDER — SODIUM CHLORIDE 0.9 % IV SOLN
216.0000 mg | Freq: Once | INTRAVENOUS | Status: AC
  Administered 2019-06-15: 220 mg via INTRAVENOUS
  Filled 2019-06-15: qty 22

## 2019-06-15 MED ORDER — SODIUM CHLORIDE 0.9% FLUSH
10.0000 mL | INTRAVENOUS | Status: DC | PRN
  Administered 2019-06-15: 10 mL

## 2019-06-15 MED ORDER — PALONOSETRON HCL INJECTION 0.25 MG/5ML
0.2500 mg | Freq: Once | INTRAVENOUS | Status: AC
  Administered 2019-06-15: 0.25 mg via INTRAVENOUS
  Filled 2019-06-15: qty 5

## 2019-06-15 NOTE — Progress Notes (Signed)
Patient has been assessed, vital signs and labs have been reviewed by Dr. Delton Coombes. ANC, Creatinine and Platelets are within treatment parameters per Dr. Delton Coombes. The patient is good to proceed with treatment at this time.

## 2019-06-15 NOTE — Assessment & Plan Note (Signed)
1.  GE junction adenocarcinoma (TX N2 M0): -PET scan on 05/23/2019 showed hypermetabolic distal esophageal mass.  17 mm short axis gastrohepatic node and 8 mm right iliac node, 8 mm left para-aortic lymph node. -Patient evaluated by Dr. Kipp Brood and is thought to be a candidate for surgery after chemoradiation therapy. -He started radiation therapy today. -I have reviewed his labs.  LFTs show slight elevation of AST and ALT. -We talked about weekly chemotherapy with carboplatin and paclitaxel.  We discussed side effects in detail.  He will proceed with his first cycle today.  I plan to reevaluate him in 1 week.  2.  Weight loss: -He lost about 30 pounds in the last 2 to 3 months. -He gained about 1 pound since last visit.  He is taking in 6 cartons of Osmolite 1.5 from 3 PM to 7 AM continuously. -He also started Protostat 30 mL twice daily.  3.  Elevated LFTs: -His AST and ALT were elevated.  We will keep a close eye on it.  We will discontinue Lipitor.  4.  Peripheral neuropathy: -He has on and off numbness in the hands and feet from diabetes.  He was on gabapentin which was discontinued on discharge.

## 2019-06-15 NOTE — Patient Instructions (Signed)
[facility] at [facility] Discharge Instructions  Labs drawn from portacath today   Thank you for choosing [facility] at [facility] to provide your oncology and hematology care.  To afford each patient quality time with our provider, please arrive at least 15 minutes before your scheduled appointment time.   If you have a lab appointment with the Cancer Center please come in thru the Main Entrance and check in at the main information desk.  You need to re-schedule your appointment should you arrive 10 or more minutes late.  We strive to give you quality time with our providers, and arriving late affects you and other patients whose appointments are after yours.  Also, if you no show three or more times for appointments you may be dismissed from the clinic at the providers discretion.     Again, thank you for choosing [facility].  Our hope is that these requests will decrease the amount of time that you wait before being seen by our physicians.       _____________________________________________________________  Should you have questions after your visit to [facility], please contact our office at [phone] between the hours of 8:00 a.m. and 4:30 p.m.  Voicemails left after 4:00 p.m. will not be returned until the following business day.  For prescription refill requests, have your pharmacy contact our office and allow 72 hours.    Due to Covid, you will need to wear a mask upon entering the hospital. If you do not have a mask, a mask will be given to you at the Main Entrance upon arrival. For doctor visits, patients may have 1 support person with them. For treatment visits, patients can not have anyone with them due to social distancing guidelines and our immunocompromised population.     

## 2019-06-15 NOTE — Patient Instructions (Signed)
[place] at [facility] Discharge Instructions  You were seen today by Dr. Delton Coombes. He went over your recent lab results. He will see you back in 1 week for labs and follow up. STOP taking the Lipitor.  Thank you for choosing [place] at [facility] to provide your oncology and hematology care.  To afford each patient quality time with our provider, please arrive at least 15 minutes before your scheduled appointment time.   If you have a lab appointment with the [facility] please come in thru the  Main Entrance and check in at the main information desk  You need to re-schedule your appointment should you arrive 10 or more minutes late.  We strive to give you quality time with our providers, and arriving late affects you and other patients whose appointments are after yours.  Also, if you no show three or more times for appointments you may be dismissed from the clinic at the providers discretion.     Again, thank you for choosing [facility].  Our hope is that these requests will decrease the amount of time that you wait before being seen by our physicians.       _____________________________________________________________  Should you have questions after your visit to [facility], please contact our office at [phone] between the hours of 8:00 a.m. and 4:30 p.m.  Voicemails left after 4:00 p.m. will not be returned until the following business day.  For prescription refill requests, have your pharmacy contact our office and allow 72 hours.    Cancer Center Support Programs:   > Cancer Support Group  2nd Tuesday of the month 1pm-2pm, Journey Room

## 2019-06-15 NOTE — Progress Notes (Signed)
[place], [place] [address]   CLINIC:  Medical Oncology/Hematology  PCP:  Glenda Chroman, MD [place] [id number] [phone]   REASON FOR VISIT:  Follow-up for GE junction adenocarcinoma.  CURRENT THERAPY: Chemoradiation therapy.  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   06/07/2019 Cancer Staging   Staging form: Esophagus - Adenocarcinoma, AJCC 8th Edition - Clinical: Stage Unknown (cTX, cN2, cM0, GX) - Signed by Derek Jack, MD on 06/07/2019   06/15/2019 -  Chemotherapy   The patient had palonosetron (ALOXI) injection 0.25 mg, 0.25 mg, Intravenous,  Once, 0 of 1 cycle CARBOplatin (PARAPLATIN) 220 mg in sodium chloride 0.9 % 100 mL chemo infusion, 220 mg (original dose ), Intravenous,  Once, 0 of 1 cycle Dose modification:   (Cycle 1) PACLitaxel (TAXOL) 108 mg in sodium chloride 0.9 % 250 mL chemo infusion (</= 80mg /m2), 50 mg/m2 = 108 mg, Intravenous,  Once, 0 of 1 cycle  for chemotherapy treatment.       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 74 y.o. male seen for follow-up of GE junction adenocarcinoma.  He started radiation therapy today.  Appetite is low.  Energy levels are 100%.  No new pains reported.  He spits out his sputum.  This is stable.  Denies taking any excessive Tylenol.  No fevers, night sweats or weight loss.    REVIEW OF SYSTEMS:  Review of Systems  HENT:   Positive for trouble swallowing.   Neurological: Positive for numbness.  All other systems reviewed and are negative.    PAST MEDICAL/SURGICAL HISTORY:  Past Medical History:  Diagnosis Date  . Anxiety   .  Arthritis   . COPD (chronic obstructive pulmonary disease) ([place])   . Diabetes mellitus without complication ([place])   . Diabetic retinopathy ([place])    NPDR OU  . Esophageal cancer ([place])   . GERD (gastroesophageal reflux disease)   . Gout   . Hypertension   . Hypertensive retinopathy    OU  . Port-A-Cath in place 06/07/2019   Past Surgical History:  Procedure Laterality Date  . BIOPSY  05/10/2019   Procedure: BIOPSY;  Surgeon: Danie Binder, MD;  Location: [facility] ENDO SUITE;  Service: Endoscopy;;  gastric esophageal  . CATARACT EXTRACTION W/PHACO Left 04/03/2016   Procedure: CATARACT EXTRACTION PHACO AND INTRAOCULAR LENS PLACEMENT LEFT EYE CDE= 11.33;  Surgeon: Tonny Branch, MD;  Location: [facility] ORS;  Service: Ophthalmology;  Laterality: Left;  left  . CATARACT EXTRACTION W/PHACO Right 04/28/2016   Procedure: CATARACT EXTRACTION PHACO AND INTRAOCULAR LENS PLACEMENT (IOC);  Surgeon: Tonny Branch, MD;  Location: [facility] ORS;  Service: Ophthalmology;  Laterality: Right;  cde-10.23   . ESOPHAGOGASTRODUODENOSCOPY (EGD) WITH PROPOFOL N/A 05/10/2019   Procedure: ESOPHAGOGASTRODUODENOSCOPY (EGD) WITH PROPOFOL with dilation as appropriate.;  Surgeon: Danie Binder, MD;  Location: [facility] ENDO SUITE;  Service: Endoscopy;  Laterality: N/A;  . EYE SURGERY Bilateral    Cat Sx OU  . JEJUNOSTOMY N/A 05/13/2019   Procedure: JEJUNOSTOMY FEEDING TUBE PLACEMENT (Procedure #2);  Surgeon: Aviva Signs, MD;  Location: [facility] ORS;  Service: General;  Laterality: N/A;  . KNEE ARTHROSCOPY Right   . NECK SURGERY    .  PORTACATH PLACEMENT Right 05/13/2019   Procedure: INSERTION PORT-A-CATH (attached catheter in right subclavian) (Procedure #1);  Surgeon: Aviva Signs, MD;  Location: [facility] ORS;  Service: General;  Laterality: Right;  . ROTATOR CUFF REPAIR Right      SOCIAL HISTORY:  Social History   Socioeconomic History  . Marital status: Married    Spouse name: Not on file  . Number of children: 0  . Years of education: Not on file  .  Highest education level: Not on file  Occupational History  . Occupation: retired  Tobacco Use  . Smoking status: Former Smoker    Types: Cigarettes    Quit date: 03/26/1989    Years since quitting: 30.2  . Smokeless tobacco: Never Used  Substance and Sexual Activity  . Alcohol use: No  . Drug use: No  . Sexual activity: Not Currently  Other Topics Concern  . Not on file  Social History Narrative  . Not on file   Social Determinants of Health   Financial Resource Strain: Low Risk   . Difficulty of Paying Living Expenses: Not hard at all  Food Insecurity: No Food Insecurity  . Worried About Charity fundraiser in the Last Year: Never true  . Ran Out of Food in the Last Year: Never true  Transportation Needs: No Transportation Needs  . Lack of Transportation (Medical): No  . Lack of Transportation (Non-Medical): No  Physical Activity: Inactive  . Days of Exercise per Week: 0 days  . Minutes of Exercise per Session: 0 min  Stress: No Stress Concern Present  . Feeling of Stress : Only a little  Social Connections: Not Isolated  . Frequency of Communication with Friends and Family: Three times a week  . Frequency of Social Gatherings with Friends and Family: Three times a week  . Attends Religious Services: More than 4 times per year  . Active Member of Clubs or Organizations: No  . Attends Archivist Meetings: More than 4 times per year  . Marital Status: Married  Human resources officer Violence: Not At Risk  . Fear of Current or Ex-Partner: No  . Emotionally Abused: No  . Physically Abused: No  . Sexually Abused: No    FAMILY HISTORY:  Family History  Adopted: Yes  Problem Relation Age of Onset  . Hypertension Brother   . Kidney disease Brother     CURRENT MEDICATIONS:  Outpatient Encounter Medications as of 06/15/2019  Medication Sig  . albuterol (PROVENTIL HFA;VENTOLIN HFA) 108 (90 Base) MCG/ACT inhaler Inhale 2 puffs into the lungs every 4 (four) hours as  needed for wheezing or shortness of breath.  Marland Kitchen albuterol (PROVENTIL) (2.5 MG/3ML) 0.083% nebulizer solution Take 2.5 mg by nebulization every 6 (six) hours as needed for wheezing or shortness of breath.  . ALPRAZolam (XANAX) 0.5 MG tablet 1 tablet (0.5 mg total) by Per J Tube route 2 (two) times daily.  . Amino Acids-Protein Hydrolys (FEEDING SUPPLEMENT, PRO-STAT SUGAR FREE 64,) LIQD 30 mLs by Per J Tube route 2 (two) times daily.  Marland Kitchen atorvastatin (LIPITOR) 40 MG tablet 1 tablet (40 mg total) by Per J Tube route every evening. for cholesterol  . CARBOPLATIN IV Inject into the vein once a week.  . famotidine (PEPCID) 20 MG tablet Place 1 tablet (20 mg total) into feeding tube daily.  Marland Kitchen FLUoxetine (PROZAC) 20 MG capsule Place 1 capsule (20 mg total) into feeding tube daily.  Marland Kitchen glipiZIDE (GLUCOTROL) 10 MG tablet Place 1 tablet (10  mg total) into feeding tube 2 (two) times daily before a meal.  . lidocaine-prilocaine (EMLA) cream Apply a pea-sized amount to port a cath site and cover with plastic wrap one hour prior to chemotherapy appointments  . metFORMIN (GLUCOPHAGE) 1000 MG tablet 1 tablet (1,000 mg total) by Per J Tube route 2 (two) times daily.  . Nutritional Supplements (FEEDING SUPPLEMENT, OSMOLITE 1.5 CAL,) LIQD Place 1,000 mLs into feeding tube continuous.  . ondansetron (ZOFRAN-ODT) 4 MG disintegrating tablet Take 4 mg by mouth every 6 (six) hours as needed.  Marland Kitchen PACLITAXEL IV Inject 50 mg/m2 into the vein once a week.   . pantoprazole sodium (PROTONIX) 40 mg/20 mL PACK Place 20 mLs (40 mg total) into feeding tube daily.  . prochlorperazine (COMPAZINE) 10 MG tablet Take 1 tablet (10 mg total) by mouth every 6 (six) hours as needed (Nausea or vomiting).  . promethazine (PHENERGAN) 25 MG suppository Place 25 mg rectally every 6 (six) hours as needed.  . terazosin (HYTRIN) 10 MG capsule 1 capsule (10 mg total) by Per J Tube route daily.  . Water For Irrigation, Sterile (FREE WATER) SOLN Place 300  mLs into feeding tube every 8 (eight) hours.  Marland Kitchen acetaminophen (TYLENOL) 325 MG tablet Place 2 tablets (650 mg total) into feeding tube every 6 (six) hours as needed for mild pain (or Fever >/= 101). (Patient not taking: Reported on 06/07/2019)  . glipiZIDE (GLUCOTROL) 5 MG tablet    No facility-administered encounter medications on file as of 06/15/2019.    ALLERGIES:  Allergies  Allergen Reactions  . Eggs Or Egg-Derived Products Nausea And Vomiting     PHYSICAL EXAM:  ECOG Performance status: 1  Vitals:   06/15/19 0845  BP: 128/71  Pulse: 85  Resp: 16  Temp: (!) 96.9 F (36.1 C)  SpO2: 100%   Filed Weights   06/15/19 0845  Weight: 192 lb 12.8 oz (87.5 kg)    Physical Exam Vitals reviewed.  Constitutional:      Appearance: Normal appearance.  Cardiovascular:     Rate and Rhythm: Normal rate and regular rhythm.     Heart sounds: Normal heart sounds.  Pulmonary:     Effort: Pulmonary effort is normal.     Breath sounds: Normal breath sounds.  Abdominal:     Palpations: Abdomen is soft.  Skin:    General: Skin is warm.  Neurological:     General: No focal deficit present.     Mental Status: He is alert and oriented to person, place, and time.  Psychiatric:        Mood and Affect: Mood normal.        Behavior: Behavior normal.      LABORATORY DATA:  I have reviewed the labs as listed.  CBC    Component Value Date/Time   WBC 5.2 06/15/2019 0847   RBC 3.80 (L) 06/15/2019 0847   HGB 11.4 (L) 06/15/2019 0847   HCT 34.0 (L) 06/15/2019 0847   PLT 143 (L) 06/15/2019 0847   MCV 89.5 06/15/2019 0847   MCH 30.0 06/15/2019 0847   MCHC 33.5 06/15/2019 0847   RDW 14.2 06/15/2019 0847   LYMPHSABS 1.4 06/15/2019 0847   MONOABS 0.4 06/15/2019 0847   EOSABS 0.1 06/15/2019 0847   BASOSABS 0.0 06/15/2019 0847   CMP Latest Ref Rng & Units 06/15/2019 06/07/2019 05/16/2019  Glucose 70 - 99 mg/dL 148(H) 166(H) 278(H)  BUN 8 - 23 mg/dL 18 17 15   Creatinine 0.61 - 1.24 mg/dL  0.60(L) 0.71 0.64  Sodium 135 - 145 mmol/L 133(L) 135 141  Potassium 3.5 - 5.1 mmol/L 4.0 4.3 3.3(L)  Chloride 98 - 111 mmol/L 98 96(L) 104  CO2 22 - 32 mmol/L 26 29 27   Calcium 8.9 - 10.3 mg/dL 8.6(L) 9.1 8.0(L)  Total Protein 6.5 - 8.1 g/dL 6.3(L) 6.7 -  Total Bilirubin 0.3 - 1.2 mg/dL 0.7 0.6 -  Alkaline Phos 38 - 126 U/L 96 93 -  AST 15 - 41 U/L 53(H) 28 -  ALT 0 - 44 U/L 80(H) 52(H) -       DIAGNOSTIC IMAGING:  I have independently reviewed the scans and discussed with patient.     ASSESSMENT & PLAN:   No problem-specific Assessment & Plan notes found for this encounter.      Orders placed this encounter:  No orders of the defined types were placed in this encounter.    Derek Jack, MD [place] [phone]

## 2019-06-15 NOTE — Progress Notes (Signed)
Labs reviewed today with MD. Liver enzymes were elevated and noted by MD. Will discontinue Lipitor per MD. Proceed with day one of treatment today.   Treatment given per orders. Patient tolerated it well without problems. Vitals stable and discharged home from clinic via wheelchair. Follow up as scheduled.

## 2019-06-15 NOTE — Patient Instructions (Signed)

## 2019-06-16 ENCOUNTER — Telehealth (HOSPITAL_COMMUNITY): Payer: Self-pay

## 2019-06-16 DIAGNOSIS — Z483 Aftercare following surgery for neoplasm: Secondary | ICD-10-CM | POA: Diagnosis not present

## 2019-06-16 DIAGNOSIS — I10 Essential (primary) hypertension: Secondary | ICD-10-CM | POA: Diagnosis not present

## 2019-06-16 DIAGNOSIS — Z934 Other artificial openings of gastrointestinal tract status: Secondary | ICD-10-CM | POA: Diagnosis not present

## 2019-06-16 DIAGNOSIS — Z452 Encounter for adjustment and management of vascular access device: Secondary | ICD-10-CM | POA: Diagnosis not present

## 2019-06-16 DIAGNOSIS — C155 Malignant neoplasm of lower third of esophagus: Secondary | ICD-10-CM | POA: Diagnosis not present

## 2019-06-16 DIAGNOSIS — J449 Chronic obstructive pulmonary disease, unspecified: Secondary | ICD-10-CM | POA: Diagnosis not present

## 2019-06-16 DIAGNOSIS — C159 Malignant neoplasm of esophagus, unspecified: Secondary | ICD-10-CM | POA: Diagnosis not present

## 2019-06-16 NOTE — Telephone Encounter (Signed)
24 hour follow up- no answer, left voicemail for them to call if they have any questions or concerns.

## 2019-06-17 ENCOUNTER — Ambulatory Visit (HOSPITAL_COMMUNITY): Payer: Medicare Other

## 2019-06-17 DIAGNOSIS — C155 Malignant neoplasm of lower third of esophagus: Secondary | ICD-10-CM | POA: Diagnosis not present

## 2019-06-17 NOTE — Progress Notes (Signed)
Nutrition Follow-up:  Patient with esophageal cancer.  Patient has started on concurrent chemotherapy and radiation therapy on 3/10.    Spoke with wife Jana Half via phone.  Wife reports patient has done well this week starting chemo and radiation.    Patient continues to tolerate osmolite 1.5 at 31ml/hr from 16 hours (3pm-7am) 6 cartons per day via J-tube.  Wife giving prostat 1ml q daily via tube as well.  No change in bowel movements.  No nausea although has to spit out saliva as not able to swallow currently due to mass.     Medications: reviewed  Labs: reviewed  Anthropometrics:   Weight increased to 192 lb from 191 lb  UBW 220 lb Estimated Energy Needs  Kcals: 2150-2580  Protein: 107-129 g Fluid: > 2 L  NUTRITION DIAGNOSIS: Inadequate oral intake continues but relying on feeding tube for nutrition   INTERVENTION:  Continue osmolite 1.5 at 109ml/hr for 16 hours (3pm-7 am) using 6 bottles per day.  Wife flushing with 1041ml water during the day Continue prosource 64ml q dialy for additional protein to better meet nutritional needs Tube feeding providing 2330 calories, 109 g protein and 2260ml free water Wife has contact information    MONITORING, EVALUATION, GOAL: Patient will utilize feeding tube to maintain weight during treatment    NEXT VISIT: March 19th, phone f/u  Kurt Duran B. Zenia Resides, [place], [place] Registered Dietitian [phone] (pager)

## 2019-06-20 DIAGNOSIS — C155 Malignant neoplasm of lower third of esophagus: Secondary | ICD-10-CM | POA: Diagnosis not present

## 2019-06-21 DIAGNOSIS — C155 Malignant neoplasm of lower third of esophagus: Secondary | ICD-10-CM | POA: Diagnosis not present

## 2019-06-22 ENCOUNTER — Inpatient Hospital Stay (HOSPITAL_COMMUNITY): Payer: Medicare Other

## 2019-06-22 ENCOUNTER — Other Ambulatory Visit: Payer: Self-pay

## 2019-06-22 ENCOUNTER — Inpatient Hospital Stay (HOSPITAL_BASED_OUTPATIENT_CLINIC_OR_DEPARTMENT_OTHER): Payer: Medicare Other | Admitting: Hematology

## 2019-06-22 ENCOUNTER — Encounter (HOSPITAL_COMMUNITY): Payer: Self-pay | Admitting: Hematology

## 2019-06-22 VITALS — BP 117/64 | HR 88 | Temp 96.9°F | Resp 18

## 2019-06-22 DIAGNOSIS — C159 Malignant neoplasm of esophagus, unspecified: Secondary | ICD-10-CM

## 2019-06-22 DIAGNOSIS — Z95828 Presence of other vascular implants and grafts: Secondary | ICD-10-CM

## 2019-06-22 DIAGNOSIS — Z5111 Encounter for antineoplastic chemotherapy: Secondary | ICD-10-CM | POA: Diagnosis not present

## 2019-06-22 DIAGNOSIS — C155 Malignant neoplasm of lower third of esophagus: Secondary | ICD-10-CM | POA: Diagnosis not present

## 2019-06-22 DIAGNOSIS — C160 Malignant neoplasm of cardia: Secondary | ICD-10-CM | POA: Diagnosis not present

## 2019-06-22 DIAGNOSIS — E871 Hypo-osmolality and hyponatremia: Secondary | ICD-10-CM | POA: Diagnosis not present

## 2019-06-22 DIAGNOSIS — R634 Abnormal weight loss: Secondary | ICD-10-CM | POA: Diagnosis not present

## 2019-06-22 DIAGNOSIS — G629 Polyneuropathy, unspecified: Secondary | ICD-10-CM | POA: Diagnosis not present

## 2019-06-22 LAB — CBC WITH DIFFERENTIAL/PLATELET
Abs Immature Granulocytes: 0.03 10*3/uL (ref 0.00–0.07)
Basophils Absolute: 0 10*3/uL (ref 0.0–0.1)
Basophils Relative: 0 %
Eosinophils Absolute: 0 10*3/uL (ref 0.0–0.5)
Eosinophils Relative: 2 %
HCT: 31.3 % — ABNORMAL LOW (ref 39.0–52.0)
Hemoglobin: 10.6 g/dL — ABNORMAL LOW (ref 13.0–17.0)
Immature Granulocytes: 1 %
Lymphocytes Relative: 11 %
Lymphs Abs: 0.3 10*3/uL — ABNORMAL LOW (ref 0.7–4.0)
MCH: 30.3 pg (ref 26.0–34.0)
MCHC: 33.9 g/dL (ref 30.0–36.0)
MCV: 89.4 fL (ref 80.0–100.0)
Monocytes Absolute: 0.3 10*3/uL (ref 0.1–1.0)
Monocytes Relative: 9 %
Neutro Abs: 2.1 10*3/uL (ref 1.7–7.7)
Neutrophils Relative %: 77 %
Platelets: 157 10*3/uL (ref 150–400)
RBC: 3.5 MIL/uL — ABNORMAL LOW (ref 4.22–5.81)
RDW: 13.8 % (ref 11.5–15.5)
WBC: 2.7 10*3/uL — ABNORMAL LOW (ref 4.0–10.5)
nRBC: 0 % (ref 0.0–0.2)

## 2019-06-22 LAB — COMPREHENSIVE METABOLIC PANEL
ALT: 29 U/L (ref 0–44)
AST: 19 U/L (ref 15–41)
Albumin: 3.2 g/dL — ABNORMAL LOW (ref 3.5–5.0)
Alkaline Phosphatase: 85 U/L (ref 38–126)
Anion gap: 11 (ref 5–15)
BUN: 15 mg/dL (ref 8–23)
CO2: 25 mmol/L (ref 22–32)
Calcium: 8.5 mg/dL — ABNORMAL LOW (ref 8.9–10.3)
Chloride: 97 mmol/L — ABNORMAL LOW (ref 98–111)
Creatinine, Ser: 0.58 mg/dL — ABNORMAL LOW (ref 0.61–1.24)
GFR calc Af Amer: >60 mL/min (ref >60)
GFR calc non Af Amer: >60 mL/min (ref >60)
Glucose, Bld: 164 mg/dL — ABNORMAL HIGH (ref 70–99)
Potassium: 3.7 mmol/L (ref 3.5–5.1)
Sodium: 133 mmol/L — ABNORMAL LOW (ref 135–145)
Total Bilirubin: 0.5 mg/dL (ref 0.3–1.2)
Total Protein: 6.2 g/dL — ABNORMAL LOW (ref 6.5–8.1)

## 2019-06-22 LAB — MAGNESIUM: Magnesium: 1.8 mg/dL (ref 1.7–2.4)

## 2019-06-22 MED ORDER — SODIUM CHLORIDE 0.9 % IV SOLN: Freq: Once | INTRAVENOUS | Status: AC

## 2019-06-22 MED ORDER — SODIUM CHLORIDE 0.9 % IV SOLN
50.0000 mg/m2 | Freq: Once | INTRAVENOUS | Status: AC
  Administered 2019-06-22: 108 mg via INTRAVENOUS
  Filled 2019-06-22: qty 18

## 2019-06-22 MED ORDER — SODIUM CHLORIDE 0.9% FLUSH
10.0000 mL | INTRAVENOUS | Status: DC | PRN
  Administered 2019-06-22: 10 mL

## 2019-06-22 MED ORDER — FAMOTIDINE IN NACL 20-0.9 MG/50ML-% IV SOLN
INTRAVENOUS | Status: AC
  Filled 2019-06-22: qty 50

## 2019-06-22 MED ORDER — PALONOSETRON HCL INJECTION 0.25 MG/5ML
0.2500 mg | Freq: Once | INTRAVENOUS | Status: AC
  Administered 2019-06-22: 0.25 mg via INTRAVENOUS

## 2019-06-22 MED ORDER — SODIUM CHLORIDE 0.9 % IV SOLN
20.0000 mg | Freq: Once | INTRAVENOUS | Status: AC
  Administered 2019-06-22: 20 mg via INTRAVENOUS
  Filled 2019-06-22: qty 20

## 2019-06-22 MED ORDER — HEPARIN SOD (PORK) LOCK FLUSH 100 UNIT/ML IV SOLN
500.0000 [IU] | Freq: Once | INTRAVENOUS | Status: AC | PRN
  Administered 2019-06-22: 500 [IU]

## 2019-06-22 MED ORDER — SODIUM CHLORIDE 0.9 % IV SOLN
216.0000 mg | Freq: Once | INTRAVENOUS | Status: AC
  Administered 2019-06-22: 220 mg via INTRAVENOUS
  Filled 2019-06-22: qty 22

## 2019-06-22 MED ORDER — DIPHENHYDRAMINE HCL 50 MG/ML IJ SOLN
50.0000 mg | Freq: Once | INTRAMUSCULAR | Status: AC
  Administered 2019-06-22: 50 mg via INTRAVENOUS

## 2019-06-22 MED ORDER — PALONOSETRON HCL INJECTION 0.25 MG/5ML
INTRAVENOUS | Status: AC
  Filled 2019-06-22: qty 5

## 2019-06-22 MED ORDER — DIPHENHYDRAMINE HCL 50 MG/ML IJ SOLN
INTRAMUSCULAR | Status: AC
  Filled 2019-06-22: qty 1

## 2019-06-22 MED ORDER — FAMOTIDINE IN NACL 20-0.9 MG/50ML-% IV SOLN
20.0000 mg | Freq: Once | INTRAVENOUS | Status: AC
  Administered 2019-06-22: 20 mg via INTRAVENOUS

## 2019-06-22 NOTE — Patient Instructions (Signed)
[facility] Discharge Instructions for Patients Receiving Chemotherapy   Beginning January 23rd 2017 lab work for the Cancer Center will be done in the  Main lab at [facility] on 1st floor. If you have a lab appointment with the Cancer Center please come in thru the  Main Entrance and check in at the main information desk   Today you received the following chemotherapy agents Taxol and Carboplatin. Follow-up as scheduled. Call clinic for any questions or concerns  To help prevent nausea and vomiting after your treatment, we encourage you to take your nausea medication.   If you develop nausea and vomiting, or diarrhea that is not controlled by your medication, call the clinic.  The clinic phone number is [phone]. Office hours are Monday-Friday 8:30am-5:00pm.  BELOW ARE SYMPTOMS THAT SHOULD BE REPORTED IMMEDIATELY:  *FEVER GREATER THAN 101.0 F  *CHILLS WITH OR WITHOUT FEVER  NAUSEA AND VOMITING THAT IS NOT CONTROLLED WITH YOUR NAUSEA MEDICATION  *UNUSUAL SHORTNESS OF BREATH  *UNUSUAL BRUISING OR BLEEDING  TENDERNESS IN MOUTH AND THROAT WITH OR WITHOUT PRESENCE OF ULCERS  *URINARY PROBLEMS  *BOWEL PROBLEMS  UNUSUAL RASH Items with * indicate a potential emergency and should be followed up as soon as possible. If you have an emergency after office hours please contact your primary care physician or go to the nearest emergency department.  Please call the clinic during office hours if you have any questions or concerns.   You may also contact the Patient Navigator at [phone] should you have any questions or need assistance in obtaining follow up care.      Resources For Cancer Patients and their Caregivers ? American Cancer Society: Can assist with transportation, wigs, general needs, runs Look Good Feel Better.        1-888-227-6333 ? Cancer Care: Provides financial assistance, online support groups, medication/co-pay assistance.   1-800-813-HOPE (4673) ? [facility] Assists [place] cancer patients and their families through emotional , educational and financial support.  336-427-4357 ? [place] DSS Where to apply for food stamps, Medicaid and utility assistance. 336-342-1394 ? [facility]: Transportation to medical appointments. 336-347-2287 ? Social Security Administration: May apply for disability if have a Stage IV cancer. 336-342-7796 1-800-772-1213 ? [place] Aging, Disability and Transit Services: Assists with nutrition, care and transit needs. 336-349-2343         

## 2019-06-22 NOTE — Progress Notes (Signed)
0922 Labs reviewed with and pt seen by Dr. Delton Coombes and pt approved for Taxol and Carboplatin infusions today per MD           Kurt Duran tolerated chemo tx well without complaints or incident. VSS upon discharge. Pt discharged via wheelchair in satisfactory condition accompanied by his wife

## 2019-06-22 NOTE — Progress Notes (Signed)
Patient has been assessed, vital signs and labs have been reviewed by Dr. Katragadda. ANC, Creatinine, LFTs, and Platelets are within treatment parameters per Dr. Katragadda. The patient is good to proceed with treatment at this time.  

## 2019-06-22 NOTE — Patient Instructions (Signed)
[facility] at [facility] Discharge Instructions  You were seen today by Dr. Katragadda. He went over your recent results. He will see you back in for labs and follow up.   Thank you for choosing [facility] at [facility] to provide your oncology and hematology care.  To afford each patient quality time with our provider, please arrive at least 15 minutes before your scheduled appointment time.   If you have a lab appointment with the Cancer Center please come in thru the  Main Entrance and check in at the main information desk  You need to re-schedule your appointment should you arrive 10 or more minutes late.  We strive to give you quality time with our providers, and arriving late affects you and other patients whose appointments are after yours.  Also, if you no show three or more times for appointments you may be dismissed from the clinic at the providers discretion.     Again, thank you for choosing [facility].  Our hope is that these requests will decrease the amount of time that you wait before being seen by our physicians.       _____________________________________________________________  Should you have questions after your visit to [facility], please contact our office at [phone] between the hours of 8:00 a.m. and 4:30 p.m.  Voicemails left after 4:00 p.m. will not be returned until the following business day.  For prescription refill requests, have your pharmacy contact our office and allow 72 hours.    Cancer Center Support Programs:   > Cancer Support Group  2nd Tuesday of the month 1pm-2pm, Journey Room    

## 2019-06-22 NOTE — Progress Notes (Signed)
Kurt Duran, [place] [address]   CLINIC:  Medical Oncology/Hematology  PCP:  Glenda Chroman, MD [place] [address] [phone]   REASON FOR VISIT:  Follow-up for GE junction adenocarcinoma.  CURRENT THERAPY: Chemoradiation therapy.  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   06/07/2019 Cancer Staging   Staging form: Esophagus - Adenocarcinoma, AJCC 8th Edition - Clinical: Stage Unknown (cTX, cN2, cM0, GX) - Signed by Derek Jack, MD on 06/07/2019   06/15/2019 -  Chemotherapy   The patient had palonosetron (ALOXI) injection 0.25 mg, 0.25 mg, Intravenous,  Once, 1 of 1 cycle Administration: 0.25 mg (06/15/2019), 0.25 mg (06/22/2019) CARBOplatin (PARAPLATIN) 220 mg in sodium chloride 0.9 % 250 mL chemo infusion, 220 mg (100 % of original dose 216 mg), Intravenous,  Once, 1 of 1 cycle Dose modification:   (original dose 216 mg, Cycle 1),   (original dose 216 mg, Cycle 1) Administration: 220 mg (06/15/2019), 220 mg (06/22/2019) PACLitaxel (TAXOL) 108 mg in sodium chloride 0.9 % 250 mL chemo infusion (</= 80mg /m2), 50 mg/m2 = 108 mg, Intravenous,  Once, 1 of 1 cycle Administration: 108 mg (06/15/2019), 108 mg (06/22/2019)  for chemotherapy treatment.       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 74 y.o. male seen for follow-up of GE junction adenocarcinoma and toxicity assessment prior to week 2 of therapy.  Energy levels are 75%.  He is taking in 6 cans of tube feeds daily.  Numbness in the hands has been stable and has not worsened since last treatment.  Still unable to swallow his sputum and spits it out.   Denies any fevers or infections.    REVIEW OF SYSTEMS:  Review of Systems  HENT:   Positive for trouble swallowing.   Neurological: Positive for numbness.  All other systems reviewed and are negative.    PAST MEDICAL/SURGICAL HISTORY:  Past Medical History:  Diagnosis Date  . Anxiety   . Arthritis   . COPD (chronic obstructive pulmonary disease) ([place])   . Diabetes mellitus without complication ([place])   . Diabetic retinopathy ([place])    NPDR OU  . Esophageal cancer ([place])   . GERD (gastroesophageal reflux disease)   . Gout   . Hypertension   . Hypertensive retinopathy    OU  . Port-A-Cath in place 06/07/2019   Past Surgical History:  Procedure Laterality Date  . BIOPSY  05/10/2019   Procedure: BIOPSY;  Surgeon: Danie Binder, MD;  Location: [facility];  Service: Endoscopy;;  gastric esophageal  . CATARACT EXTRACTION W/PHACO Left 04/03/2016   Procedure: CATARACT EXTRACTION PHACO AND INTRAOCULAR LENS PLACEMENT LEFT EYE CDE= 11.33;  Surgeon: Tonny Branch, MD;  Location: [facility];  Service: Ophthalmology;  Laterality: Left;  left  . CATARACT EXTRACTION W/PHACO Right 04/28/2016   Procedure: CATARACT EXTRACTION PHACO AND INTRAOCULAR LENS PLACEMENT (IOC);  Surgeon: Tonny Branch, MD;  Location: [facility];  Service: Ophthalmology;  Laterality: Right;  cde-10.23   . ESOPHAGOGASTRODUODENOSCOPY (EGD) WITH PROPOFOL N/A 05/10/2019   Procedure: ESOPHAGOGASTRODUODENOSCOPY (EGD) WITH PROPOFOL with dilation as appropriate.;  Surgeon: Danie Binder, MD;  Location: [facility];  Service: Endoscopy;  Laterality: N/A;  .  EYE SURGERY Bilateral    Cat Sx OU  . JEJUNOSTOMY N/A 05/13/2019   Procedure: JEJUNOSTOMY FEEDING TUBE PLACEMENT (Procedure #2);  Surgeon: Aviva Signs, MD;  Location: [facility];  Service: General;  Laterality: N/A;  . KNEE ARTHROSCOPY Right   . NECK SURGERY    . PORTACATH PLACEMENT Right 05/13/2019   Procedure: INSERTION PORT-A-CATH (attached catheter in right subclavian) (Procedure #1);   Surgeon: Aviva Signs, MD;  Location: [facility];  Service: General;  Laterality: Right;  . ROTATOR CUFF REPAIR Right      SOCIAL HISTORY:  Social History   Socioeconomic History  . Marital status: Married    Spouse name: Not on file  . Number of children: 0  . Years of education: Not on file  . Highest education level: Not on file  Occupational History  . Occupation: retired  Tobacco Use  . Smoking status: Former Smoker    Types: Cigarettes    Quit date: 03/26/1989    Years since quitting: 30.2  . Smokeless tobacco: Never Used  Substance and Sexual Activity  . Alcohol use: No  . Drug use: No  . Sexual activity: Not Currently  Other Topics Concern  . Not on file  Social History Narrative  . Not on file   Social Determinants of Health   Financial Resource Strain: Low Risk   . Difficulty of Paying Living Expenses: Not hard at all  Food Insecurity: No Food Insecurity  . Worried About Charity fundraiser in the Last Year: Never true  . Ran Out of Food in the Last Year: Never true  Transportation Needs: No Transportation Needs  . Lack of Transportation (Medical): No  . Lack of Transportation (Non-Medical): No  Physical Activity: Inactive  . Days of Exercise per Week: 0 days  . Minutes of Exercise per Session: 0 min  Stress: No Stress Concern Present  . Feeling of Stress : Only a little  Social Connections: Not Isolated  . Frequency of Communication with Friends and Family: Three times a week  . Frequency of Social Gatherings with Friends and Family: Three times a week  . Attends Religious Services: More than 4 times per year  . Active Member of Clubs or Organizations: No  . Attends Archivist Meetings: More than 4 times per year  . Marital Status: Married  Human resources officer Violence: Not At Risk  . Fear of Current or Ex-Partner: No  . Emotionally Abused: No  . Physically Abused: No  . Sexually Abused: No    FAMILY HISTORY:  Family History  Adopted: Yes   Problem Relation Age of Onset  . Hypertension Brother   . Kidney disease Brother     CURRENT MEDICATIONS:  Outpatient Encounter Medications as of 06/22/2019  Medication Sig  . ALPRAZolam (XANAX) 0.5 MG tablet 1 tablet (0.5 mg total) by Per J Tube route 2 (two) times daily.  . Amino Acids-Protein Hydrolys (FEEDING SUPPLEMENT, PRO-STAT SUGAR FREE 64,) LIQD 30 mLs by Per J Tube route 2 (two) times daily.  Marland Kitchen atorvastatin (LIPITOR) 40 MG tablet 1 tablet (40 mg total) by Per J Tube route every evening. for cholesterol  . CARBOPLATIN IV Inject into the vein once a week.  . famotidine (PEPCID) 20 MG tablet Place 1 tablet (20 mg total) into feeding tube daily.  Marland Kitchen FLUoxetine (PROZAC) 20 MG capsule Place 1 capsule (20 mg total) into feeding tube daily.  Marland Kitchen glipiZIDE (GLUCOTROL) 10 MG tablet Place 1 tablet (10 mg total) into  feeding tube 2 (two) times daily before a meal.  . metFORMIN (GLUCOPHAGE) 1000 MG tablet 1 tablet (1,000 mg total) by Per J Tube route 2 (two) times daily.  . Nutritional Supplements (FEEDING SUPPLEMENT, OSMOLITE 1.5 CAL,) LIQD Place 1,000 mLs into feeding tube continuous.  Marland Kitchen PACLITAXEL IV Inject 50 mg/m2 into the vein once a week.   . pantoprazole sodium (PROTONIX) 40 mg/20 mL PACK Place 20 mLs (40 mg total) into feeding tube daily.  Marland Kitchen terazosin (HYTRIN) 10 MG capsule 1 capsule (10 mg total) by Per J Tube route daily.  . Water For Irrigation, Sterile (FREE WATER) SOLN Place 300 mLs into feeding tube every 8 (eight) hours.  Marland Kitchen acetaminophen (TYLENOL) 325 MG tablet Place 2 tablets (650 mg total) into feeding tube every 6 (six) hours as needed for mild pain (or Fever >/= 101). (Patient not taking: Reported on 06/07/2019)  . albuterol (PROVENTIL HFA;VENTOLIN HFA) 108 (90 Base) MCG/ACT inhaler Inhale 2 puffs into the lungs every 4 (four) hours as needed for wheezing or shortness of breath.  Marland Kitchen albuterol (PROVENTIL) (2.5 MG/3ML) 0.083% nebulizer solution Take 2.5 mg by nebulization every 6  (six) hours as needed for wheezing or shortness of breath.  . lidocaine-prilocaine (EMLA) cream Apply a pea-sized amount to port a cath site and cover with plastic wrap one hour prior to chemotherapy appointments (Patient not taking: Reported on 06/22/2019)  . ondansetron (ZOFRAN-ODT) 4 MG disintegrating tablet Take 4 mg by mouth every 6 (six) hours as needed.  . prochlorperazine (COMPAZINE) 10 MG tablet Take 1 tablet (10 mg total) by mouth every 6 (six) hours as needed (Nausea or vomiting). (Patient not taking: Reported on 06/22/2019)  . promethazine (PHENERGAN) 25 MG suppository Place 25 mg rectally every 6 (six) hours as needed.  . [DISCONTINUED] glipiZIDE (GLUCOTROL) 5 MG tablet    No facility-administered encounter medications on file as of 06/22/2019.    ALLERGIES:  Allergies  Allergen Reactions  . Eggs Or Egg-Derived Products Nausea And Vomiting     PHYSICAL EXAM:  ECOG Performance status: 1  Vitals:   06/22/19 0900  BP: (!) 107/59  Pulse: 95  Resp: 18  Temp: (!) 96.9 F (36.1 C)  SpO2: 100%   Filed Weights   06/22/19 0900  Weight: 192 lb 6.4 oz (87.3 kg)    Physical Exam Vitals reviewed.  Constitutional:      Appearance: Normal appearance.  Cardiovascular:     Rate and Rhythm: Normal rate and regular rhythm.     Heart sounds: Normal heart sounds.  Pulmonary:     Effort: Pulmonary effort is normal.     Breath sounds: Normal breath sounds.  Abdominal:     Palpations: Abdomen is soft.  Skin:    General: Skin is warm.  Neurological:     General: No focal deficit present.     Mental Status: He is alert and oriented to person, place, and time.  Psychiatric:        Mood and Affect: Mood normal.        Behavior: Behavior normal.      LABORATORY DATA:  I have reviewed the labs as listed.  CBC    Component Value Date/Time   WBC 2.7 (L) 06/22/2019 0828   RBC 3.50 (L) 06/22/2019 0828   HGB 10.6 (L) 06/22/2019 0828   HCT 31.3 (L) 06/22/2019 0828   PLT 157  06/22/2019 0828   MCV 89.4 06/22/2019 0828   MCH 30.3 06/22/2019 0828   MCHC 33.9  06/22/2019 0828   RDW 13.8 06/22/2019 0828   LYMPHSABS 0.3 (L) 06/22/2019 0828   MONOABS 0.3 06/22/2019 0828   EOSABS 0.0 06/22/2019 0828   BASOSABS 0.0 06/22/2019 0828   CMP Latest Ref Rng & Units 06/22/2019 06/15/2019 06/07/2019  Glucose 70 - 99 mg/dL 164(H) 148(H) 166(H)  BUN 8 - 23 mg/dL 15 18 17   Creatinine 0.61 - 1.24 mg/dL 0.58(L) 0.60(L) 0.71  Sodium 135 - 145 mmol/L 133(L) 133(L) 135  Potassium 3.5 - 5.1 mmol/L 3.7 4.0 4.3  Chloride 98 - 111 mmol/L 97(L) 98 96(L)  CO2 22 - 32 mmol/L 25 26 29   Calcium 8.9 - 10.3 mg/dL 8.5(L) 8.6(L) 9.1  Total Protein 6.5 - 8.1 g/dL 6.2(L) 6.3(L) 6.7  Total Bilirubin 0.3 - 1.2 mg/dL 0.5 0.7 0.6  Alkaline Phos 38 - 126 U/L 85 96 93  AST 15 - 41 U/L 19 53(H) 28  ALT 0 - 44 U/L 29 80(H) 52(H)       DIAGNOSTIC IMAGING:  I have reviewed the scans.     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  GE junction adenocarcinoma ([place] N2 M0): -PET scan on 05/23/2019 showed hypermetabolic distal esophageal mass.  Gastrohepatic and right iliac and left para-aortic lymph nodes were positive. -He was evaluated by Dr. Kipp Brood and thought to be a candidate for surgery after completion of chemoradiation therapy. -He was started on chemo XRT with carboplatin and Taxol weekly on 06/15/2019. -I have reviewed his labs today.  White count is low at 2.7.  [place] is 2100.  Platelet count is normal. -LFTs have normalized.  He will proceed with cycle 2 without any dose modifications.  2.  Weight loss: -He lost about 30 pounds in the last 2 to 3 months. -His weight has been stable since last week.  He is taking in 6 cans of Osmolite 1.5 from 3 PM to 7 AM continuously. -He is also on Prozac 30 mg twice daily.  3.  Peripheral neuropathy: -He has on and off numbness in the hands and feet from diabetes.  He was on gabapentin which was discontinued at the time of recent  discharge.  We will closely monitor numbness as chemotherapy can worsen it.  4.  Elevated LFTs: -His AST and ALT were elevated prior to cycle 1.  They have normalized today.      Orders placed this encounter:  No orders of the defined types were placed in this encounter.    Derek Jack, MD [place] [phone]

## 2019-06-23 DIAGNOSIS — I10 Essential (primary) hypertension: Secondary | ICD-10-CM | POA: Diagnosis not present

## 2019-06-23 DIAGNOSIS — Z934 Other artificial openings of gastrointestinal tract status: Secondary | ICD-10-CM | POA: Diagnosis not present

## 2019-06-23 DIAGNOSIS — Z452 Encounter for adjustment and management of vascular access device: Secondary | ICD-10-CM | POA: Diagnosis not present

## 2019-06-23 DIAGNOSIS — C155 Malignant neoplasm of lower third of esophagus: Secondary | ICD-10-CM | POA: Diagnosis not present

## 2019-06-23 DIAGNOSIS — Z483 Aftercare following surgery for neoplasm: Secondary | ICD-10-CM | POA: Diagnosis not present

## 2019-06-23 DIAGNOSIS — J449 Chronic obstructive pulmonary disease, unspecified: Secondary | ICD-10-CM | POA: Diagnosis not present

## 2019-06-23 DIAGNOSIS — C159 Malignant neoplasm of esophagus, unspecified: Secondary | ICD-10-CM | POA: Diagnosis not present

## 2019-06-24 ENCOUNTER — Ambulatory Visit (HOSPITAL_COMMUNITY): Payer: Medicare Other

## 2019-06-24 ENCOUNTER — Telehealth (INDEPENDENT_AMBULATORY_CARE_PROVIDER_SITE_OTHER): Payer: Medicare Other | Admitting: Thoracic Surgery (Cardiothoracic Vascular Surgery)

## 2019-06-24 ENCOUNTER — Other Ambulatory Visit: Payer: Self-pay

## 2019-06-24 DIAGNOSIS — C159 Malignant neoplasm of esophagus, unspecified: Secondary | ICD-10-CM

## 2019-06-24 DIAGNOSIS — C155 Malignant neoplasm of lower third of esophagus: Secondary | ICD-10-CM | POA: Diagnosis not present

## 2019-06-24 NOTE — Progress Notes (Signed)
Nutrition Follow-up:  Patient with esophageal cancer.  Patient has started concurrent chemotherapy and radiation therapy.    Spoke with wife, Jana Half via phone.  Patient continues to tolerate osmolite 1.5 continuous at 80ml/hr for 16 hours (3pm-7am), 6 cartons per day via J-tube.  Wife giving prostat 45ml q daily via tube.  Wife reports 2 bowel movements daily typically.  Reports patient continues with having to spit up saliva and that at times makes him gag.    Medications: reviewed  Labs: reviewed  Anthropometrics:   Weight 192 lb 6.4 oz on 3/17 stable from 192 lb 12.8 oz on 3/10.    191 lb on 3/2 189 lb on 2/26   Estimated Energy Needs  Kcals: 2150-2580 Protein: 107-129 g Fluid: > 2 L  NUTRITION DIAGNOSIS: Inadequate oral intake continues but relying on tube feeding for nutrition   INTERVENTION:  Continue osmolite 1.5 at 82ml/hr (3pm-7am) using 6 bottles per day.  Wife flushing with 1044ml water during the day for better hydration. Continue prosource 73ml q daily for additional protein to better meet nutritional needs. Tube feeding provides 2330 calories, 109 g protein and 2269ml free water.   Wife has contact information.     MONITORING, EVALUATION, GOAL: Patien will utilize feeding tube to maintain weight during treatment.     NEXT VISIT: March 26 phone f/u  Giovannie Scerbo B. Zenia Resides, [place], [place] Registered Dietitian [phone] (pager)

## 2019-06-24 NOTE — Progress Notes (Signed)
     [address]       [place] [address]             [phone]       I spoke with Mrs. Mancel Bale today.  Her husband has begun his chemotherapy and radiation.  He seems to be tolerating this well.  His weight is about the same.  He was 192 pounds at his appointment.  He is tolerating his tube feeds well.  On review of his chart he began his chemotherapy 06/15/2019.  I do not see any documentation of his radiation therapy system.  I will discuss this in further detail at the next appointment.  Frances Joynt Bary Leriche

## 2019-06-25 NOTE — Assessment & Plan Note (Signed)
1.  GE junction adenocarcinoma (TX N2 M0): -PET scan on 05/23/2019 showed hypermetabolic distal esophageal mass.  Gastrohepatic and right iliac and left para-aortic lymph nodes were positive. -He was evaluated by Dr. Kipp Brood and thought to be a candidate for surgery after completion of chemoradiation therapy. -He was started on chemo XRT with carboplatin and Taxol weekly on 06/15/2019. -I have reviewed his labs today.  White count is low at 2.7.  [place] is 2100.  Platelet count is normal. -LFTs have normalized.  He will proceed with cycle 2 without any dose modifications.  2.  Weight loss: -He lost about 30 pounds in the last 2 to 3 months. -His weight has been stable since last week.  He is taking in 6 cans of Osmolite 1.5 from 3 PM to 7 AM continuously. -He is also on Prozac 30 mg twice daily.  3.  Peripheral neuropathy: -He has on and off numbness in the hands and feet from diabetes.  He was on gabapentin which was discontinued at the time of recent discharge.  We will closely monitor numbness as chemotherapy can worsen it.  4.  Elevated LFTs: -His AST and ALT were elevated prior to cycle 1.  They have normalized today.

## 2019-06-27 DIAGNOSIS — R0789 Other chest pain: Secondary | ICD-10-CM | POA: Diagnosis not present

## 2019-06-27 DIAGNOSIS — K449 Diaphragmatic hernia without obstruction or gangrene: Secondary | ICD-10-CM | POA: Diagnosis not present

## 2019-06-27 DIAGNOSIS — R000 Tachycardia, unspecified: Secondary | ICD-10-CM | POA: Diagnosis not present

## 2019-06-27 DIAGNOSIS — K219 Gastro-esophageal reflux disease without esophagitis: Secondary | ICD-10-CM | POA: Diagnosis not present

## 2019-06-27 DIAGNOSIS — Z20822 Contact with and (suspected) exposure to covid-19: Secondary | ICD-10-CM | POA: Diagnosis not present

## 2019-06-27 DIAGNOSIS — D696 Thrombocytopenia, unspecified: Secondary | ICD-10-CM | POA: Diagnosis not present

## 2019-06-27 DIAGNOSIS — R0689 Other abnormalities of breathing: Secondary | ICD-10-CM | POA: Diagnosis not present

## 2019-06-27 DIAGNOSIS — E7800 Pure hypercholesterolemia, unspecified: Secondary | ICD-10-CM | POA: Diagnosis not present

## 2019-06-27 DIAGNOSIS — K228 Other specified diseases of esophagus: Secondary | ICD-10-CM | POA: Diagnosis not present

## 2019-06-27 DIAGNOSIS — E871 Hypo-osmolality and hyponatremia: Secondary | ICD-10-CM | POA: Diagnosis not present

## 2019-06-27 DIAGNOSIS — K828 Other specified diseases of gallbladder: Secondary | ICD-10-CM | POA: Diagnosis not present

## 2019-06-27 DIAGNOSIS — F419 Anxiety disorder, unspecified: Secondary | ICD-10-CM | POA: Diagnosis not present

## 2019-06-27 DIAGNOSIS — R112 Nausea with vomiting, unspecified: Secondary | ICD-10-CM | POA: Diagnosis not present

## 2019-06-27 DIAGNOSIS — R531 Weakness: Secondary | ICD-10-CM | POA: Diagnosis not present

## 2019-06-27 DIAGNOSIS — Z934 Other artificial openings of gastrointestinal tract status: Secondary | ICD-10-CM | POA: Diagnosis not present

## 2019-06-27 DIAGNOSIS — I700 Atherosclerosis of aorta: Secondary | ICD-10-CM | POA: Diagnosis not present

## 2019-06-27 DIAGNOSIS — Z6825 Body mass index (BMI) 25.0-25.9, adult: Secondary | ICD-10-CM | POA: Diagnosis not present

## 2019-06-27 DIAGNOSIS — R110 Nausea: Secondary | ICD-10-CM | POA: Diagnosis not present

## 2019-06-27 DIAGNOSIS — E1165 Type 2 diabetes mellitus with hyperglycemia: Secondary | ICD-10-CM | POA: Diagnosis not present

## 2019-06-27 DIAGNOSIS — C159 Malignant neoplasm of esophagus, unspecified: Secondary | ICD-10-CM | POA: Diagnosis not present

## 2019-06-27 DIAGNOSIS — E119 Type 2 diabetes mellitus without complications: Secondary | ICD-10-CM | POA: Diagnosis not present

## 2019-06-27 DIAGNOSIS — M47812 Spondylosis without myelopathy or radiculopathy, cervical region: Secondary | ICD-10-CM | POA: Diagnosis not present

## 2019-06-27 DIAGNOSIS — E440 Moderate protein-calorie malnutrition: Secondary | ICD-10-CM | POA: Diagnosis not present

## 2019-06-27 DIAGNOSIS — F329 Major depressive disorder, single episode, unspecified: Secondary | ICD-10-CM | POA: Diagnosis not present

## 2019-06-27 DIAGNOSIS — R109 Unspecified abdominal pain: Secondary | ICD-10-CM | POA: Diagnosis not present

## 2019-06-27 DIAGNOSIS — D709 Neutropenia, unspecified: Secondary | ICD-10-CM | POA: Diagnosis not present

## 2019-06-27 DIAGNOSIS — E860 Dehydration: Secondary | ICD-10-CM | POA: Diagnosis not present

## 2019-06-27 DIAGNOSIS — J90 Pleural effusion, not elsewhere classified: Secondary | ICD-10-CM | POA: Diagnosis not present

## 2019-06-27 DIAGNOSIS — M109 Gout, unspecified: Secondary | ICD-10-CM | POA: Diagnosis not present

## 2019-06-28 DIAGNOSIS — R0789 Other chest pain: Secondary | ICD-10-CM | POA: Diagnosis not present

## 2019-06-28 DIAGNOSIS — R531 Weakness: Secondary | ICD-10-CM | POA: Diagnosis not present

## 2019-06-28 DIAGNOSIS — K228 Other specified diseases of esophagus: Secondary | ICD-10-CM | POA: Diagnosis not present

## 2019-06-28 DIAGNOSIS — C159 Malignant neoplasm of esophagus, unspecified: Secondary | ICD-10-CM | POA: Diagnosis not present

## 2019-06-29 ENCOUNTER — Inpatient Hospital Stay (HOSPITAL_BASED_OUTPATIENT_CLINIC_OR_DEPARTMENT_OTHER): Payer: Medicare Other | Admitting: Hematology

## 2019-06-29 ENCOUNTER — Inpatient Hospital Stay (HOSPITAL_COMMUNITY): Payer: Medicare Other

## 2019-06-29 ENCOUNTER — Encounter (HOSPITAL_COMMUNITY): Payer: Self-pay | Admitting: Hematology

## 2019-06-29 ENCOUNTER — Other Ambulatory Visit: Payer: Self-pay

## 2019-06-29 VITALS — BP 94/56 | HR 102 | Temp 96.2°F | Resp 18 | Wt 194.8 lb

## 2019-06-29 VITALS — BP 118/70 | HR 85 | Temp 97.4°F | Resp 18

## 2019-06-29 DIAGNOSIS — R634 Abnormal weight loss: Secondary | ICD-10-CM | POA: Diagnosis not present

## 2019-06-29 DIAGNOSIS — G629 Polyneuropathy, unspecified: Secondary | ICD-10-CM | POA: Diagnosis not present

## 2019-06-29 DIAGNOSIS — C159 Malignant neoplasm of esophagus, unspecified: Secondary | ICD-10-CM

## 2019-06-29 DIAGNOSIS — Z5111 Encounter for antineoplastic chemotherapy: Secondary | ICD-10-CM | POA: Diagnosis not present

## 2019-06-29 DIAGNOSIS — Z95828 Presence of other vascular implants and grafts: Secondary | ICD-10-CM

## 2019-06-29 DIAGNOSIS — E871 Hypo-osmolality and hyponatremia: Secondary | ICD-10-CM | POA: Diagnosis not present

## 2019-06-29 DIAGNOSIS — C160 Malignant neoplasm of cardia: Secondary | ICD-10-CM | POA: Diagnosis not present

## 2019-06-29 DIAGNOSIS — C155 Malignant neoplasm of lower third of esophagus: Secondary | ICD-10-CM | POA: Diagnosis not present

## 2019-06-29 LAB — COMPREHENSIVE METABOLIC PANEL
ALT: 24 U/L (ref 0–44)
AST: 23 U/L (ref 15–41)
Albumin: 2.6 g/dL — ABNORMAL LOW (ref 3.5–5.0)
Alkaline Phosphatase: 79 U/L (ref 38–126)
Anion gap: 11 (ref 5–15)
BUN: 15 mg/dL (ref 8–23)
CO2: 24 mmol/L (ref 22–32)
Calcium: 8.2 mg/dL — ABNORMAL LOW (ref 8.9–10.3)
Chloride: 98 mmol/L (ref 98–111)
Creatinine, Ser: 0.57 mg/dL — ABNORMAL LOW (ref 0.61–1.24)
GFR calc Af Amer: >60 mL/min (ref >60)
GFR calc non Af Amer: >60 mL/min (ref >60)
Glucose, Bld: 202 mg/dL — ABNORMAL HIGH (ref 70–99)
Potassium: 3.3 mmol/L — ABNORMAL LOW (ref 3.5–5.1)
Sodium: 133 mmol/L — ABNORMAL LOW (ref 135–145)
Total Bilirubin: 0.4 mg/dL (ref 0.3–1.2)
Total Protein: 6 g/dL — ABNORMAL LOW (ref 6.5–8.1)

## 2019-06-29 LAB — CBC WITH DIFFERENTIAL/PLATELET
Abs Immature Granulocytes: 0.03 10*3/uL (ref 0.00–0.07)
Basophils Absolute: 0 10*3/uL (ref 0.0–0.1)
Basophils Relative: 0 %
Eosinophils Absolute: 0 10*3/uL (ref 0.0–0.5)
Eosinophils Relative: 1 %
HCT: 28.2 % — ABNORMAL LOW (ref 39.0–52.0)
Hemoglobin: 9.5 g/dL — ABNORMAL LOW (ref 13.0–17.0)
Immature Granulocytes: 1 %
Lymphocytes Relative: 8 %
Lymphs Abs: 0.3 10*3/uL — ABNORMAL LOW (ref 0.7–4.0)
MCH: 30.2 pg (ref 26.0–34.0)
MCHC: 33.7 g/dL (ref 30.0–36.0)
MCV: 89.5 fL (ref 80.0–100.0)
Monocytes Absolute: 0.3 10*3/uL (ref 0.1–1.0)
Monocytes Relative: 10 %
Neutro Abs: 2.7 10*3/uL (ref 1.7–7.7)
Neutrophils Relative %: 80 %
Platelets: 166 10*3/uL (ref 150–400)
RBC: 3.15 MIL/uL — ABNORMAL LOW (ref 4.22–5.81)
RDW: 14.2 % (ref 11.5–15.5)
WBC: 3.4 10*3/uL — ABNORMAL LOW (ref 4.0–10.5)
nRBC: 0 % (ref 0.0–0.2)

## 2019-06-29 LAB — MAGNESIUM: Magnesium: 1.9 mg/dL (ref 1.7–2.4)

## 2019-06-29 MED ORDER — SODIUM CHLORIDE 0.9 % IV SOLN
50.0000 mg/m2 | Freq: Once | INTRAVENOUS | Status: AC
  Administered 2019-06-29: 108 mg via INTRAVENOUS
  Filled 2019-06-29: qty 18

## 2019-06-29 MED ORDER — DIPHENHYDRAMINE HCL 50 MG/ML IJ SOLN
50.0000 mg | Freq: Once | INTRAMUSCULAR | Status: AC
  Administered 2019-06-29: 50 mg via INTRAVENOUS
  Filled 2019-06-29: qty 1

## 2019-06-29 MED ORDER — SODIUM CHLORIDE 0.9 % IV SOLN: Freq: Once | INTRAVENOUS | Status: AC

## 2019-06-29 MED ORDER — SODIUM CHLORIDE 0.9 % IV SOLN
216.0000 mg | Freq: Once | INTRAVENOUS | Status: AC
  Administered 2019-06-29: 220 mg via INTRAVENOUS
  Filled 2019-06-29: qty 22

## 2019-06-29 MED ORDER — SODIUM CHLORIDE 0.9% FLUSH
10.0000 mL | INTRAVENOUS | Status: DC | PRN
  Administered 2019-06-29: 10 mL

## 2019-06-29 MED ORDER — FAMOTIDINE IN NACL 20-0.9 MG/50ML-% IV SOLN
20.0000 mg | Freq: Once | INTRAVENOUS | Status: AC
  Administered 2019-06-29: 20 mg via INTRAVENOUS
  Filled 2019-06-29: qty 50

## 2019-06-29 MED ORDER — SCOPOLAMINE 1 MG/3DAYS TD PT72: 1.0000 | MEDICATED_PATCH | TRANSDERMAL | 12 refills | Status: DC

## 2019-06-29 MED ORDER — HEPARIN SOD (PORK) LOCK FLUSH 100 UNIT/ML IV SOLN
500.0000 [IU] | Freq: Once | INTRAVENOUS | Status: AC | PRN
  Administered 2019-06-29: 500 [IU]

## 2019-06-29 MED ORDER — PALONOSETRON HCL INJECTION 0.25 MG/5ML
0.2500 mg | Freq: Once | INTRAVENOUS | Status: AC
  Administered 2019-06-29: 0.25 mg via INTRAVENOUS
  Filled 2019-06-29: qty 5

## 2019-06-29 MED ORDER — SODIUM CHLORIDE 0.9 % IV SOLN
20.0000 mg | Freq: Once | INTRAVENOUS | Status: AC
  Administered 2019-06-29: 20 mg via INTRAVENOUS
  Filled 2019-06-29: qty 20

## 2019-06-29 MED ORDER — SCOPOLAMINE 1 MG/3DAYS TD PT72
1.0000 | MEDICATED_PATCH | Freq: Once | TRANSDERMAL | Status: AC
  Administered 2019-06-29: 1.5 mg via TRANSDERMAL
  Filled 2019-06-29: qty 1

## 2019-06-29 NOTE — Progress Notes (Signed)
Labs reviewed with MD today. Will given treatment as planned and will order scopolamine patch per MD.   Treatment given per orders. Patient tolerated it well without problems. Vitals stable and discharged home from clinic via wheelchair. Follow up as scheduled.

## 2019-06-29 NOTE — Assessment & Plan Note (Signed)
1.  GE junction adenocarcinoma (TX N2 M0): -PET scan on 05/23/2019 showed hypermetabolic distal esophageal mass.  Gastrohepatic and right iliac and left para-aortic lymph nodes were positive. -He was evaluated by Dr. Kipp Brood and thought to be a candidate for surgery after completion of chemoradiation therapy. -2 weekly doses of carboplatin and paclitaxel and XRT from 06/15/2019 through 06/22/2019. -He was hospitalized at [facility] from 06/27/2019 through 06/28/2019 with nausea and regurgitation of sputum. -He received IV hydration.  I reviewed the records from the hospital.  He gained 2 pounds. -He was released from the hospital yesterday.  He missed radiation Monday and Tuesday.  He started back radiation today. -I reviewed his labs.  White count is 3.4 with 80% neutrophils.  Platelets are normal.  Creatinine is normal. -I will proceed with week 3 of treatment today.  2.  Secretions: -He has a lot of production of sputum, unable to swallow.  He is spitting it out. -I will start him on scopolamine patch, hopefully to dry up the secretions.  3.  Weight loss: -He lost about 30 pounds in the last 2 to 3 months prior to presentation. -His weight has been stable to slight gain.  He is taking in 6 cans of Osmolite 1.5 from 3 PM to 7 AM continuously. -He is also on Protostat 30 mL twice daily.  4.  Peripheral neuropathy: -He has on and off numbness in the hands and feet from diabetes. -He was on gabapentin which was discontinued at diagnosis. -We will closely monitor his neuropathy as chemotherapy can worsen it.

## 2019-06-29 NOTE — Progress Notes (Signed)
Kurt Duran, [place] [address]   CLINIC:  Medical Oncology/Hematology  PCP:  Glenda Chroman, MD [place] [address] [phone]   REASON FOR VISIT:  Follow-up for GE junction adenocarcinoma.  CURRENT THERAPY: Chemoradiation therapy.  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   06/07/2019 Cancer Staging   Staging form: Esophagus - Adenocarcinoma, AJCC 8th Edition - Clinical: Stage Unknown (cTX, cN2, cM0, GX) - Signed by Derek Jack, MD on 06/07/2019   06/15/2019 -  Chemotherapy   The patient had palonosetron (ALOXI) injection 0.25 mg, 0.25 mg, Intravenous,  Once, 1 of 1 cycle Administration: 0.25 mg (06/15/2019), 0.25 mg (06/22/2019), 0.25 mg (06/29/2019) CARBOplatin (PARAPLATIN) 220 mg in sodium chloride 0.9 % 250 mL chemo infusion, 220 mg (100 % of original dose 216 mg), Intravenous,  Once, 1 of 1 cycle Dose modification:   (original dose 216 mg, Cycle 1),   (original dose 216 mg, Cycle 1),   (original dose 216 mg, Cycle 1) Administration: 220 mg (06/15/2019), 220 mg (06/22/2019), 220 mg (06/29/2019) PACLitaxel (TAXOL) 108 mg in sodium chloride 0.9 % 250 mL chemo infusion (</= 80mg /m2), 50 mg/m2 = 108 mg, Intravenous,  Once, 1 of 1 cycle Administration: 108 mg (06/15/2019), 108 mg (06/22/2019), 108 mg (06/29/2019)  for chemotherapy treatment.       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 male seen for follow-up of GE junction adenocarcinoma and tox adjustment prior to week 3 of chemotherapy.  He was hospitalized on Monday and released home on Tuesday.  He missed radiation on Monday and Tuesday.  He  was hospitalized for nausea and excessive sputum production.  Reports no appetite.  Energy levels are 25%.  Has to continuously spit out sputum.  Gained 2 pounds because of IV hydration.  He is continuing tube feeds.  Able to walk with the help of cane.  Numbness in the extremities is stable and on and off.  REVIEW OF SYSTEMS:  Review of Systems  HENT:   Positive for trouble swallowing.   Neurological: Positive for numbness.  All other systems reviewed and are negative.    PAST MEDICAL/SURGICAL HISTORY:  Past Medical History:  Diagnosis Date  . Anxiety   . Arthritis   . COPD (chronic obstructive pulmonary disease) ([place])   . Diabetes mellitus without complication ([place])   . Diabetic retinopathy ([place])    NPDR OU  . Esophageal cancer ([place])   . GERD (gastroesophageal reflux disease)   . Gout   . Hypertension   . Hypertensive retinopathy    OU  . Port-A-Cath in place 06/07/2019   Past Surgical History:  Procedure Laterality Date  . BIOPSY  05/10/2019   Procedure: BIOPSY;  Surgeon: Danie Binder, MD;  Location: [facility];  Service: Endoscopy;;  gastric esophageal  . CATARACT EXTRACTION W/PHACO Left 04/03/2016   Procedure: CATARACT EXTRACTION PHACO AND INTRAOCULAR LENS PLACEMENT LEFT EYE CDE= 11.33;  Surgeon: Tonny Branch, MD;  Location: [facility] ORS;  Service: Ophthalmology;  Laterality: Left;  left  . CATARACT EXTRACTION W/PHACO Right 04/28/2016   Procedure: CATARACT EXTRACTION PHACO AND INTRAOCULAR LENS PLACEMENT (IOC);  Surgeon: Tonny Branch, MD;  Location: [facility]  ORS;  Service: Ophthalmology;  Laterality: Right;  cde-10.23   . ESOPHAGOGASTRODUODENOSCOPY (EGD) WITH PROPOFOL N/A 05/10/2019   Procedure: ESOPHAGOGASTRODUODENOSCOPY (EGD) WITH PROPOFOL with dilation as appropriate.;  Surgeon: Danie Binder, MD;  Location: [facility];  Service: Endoscopy;  Laterality: N/A;  . EYE SURGERY Bilateral    Cat Sx OU  . JEJUNOSTOMY N/A 05/13/2019   Procedure: JEJUNOSTOMY FEEDING TUBE PLACEMENT  (Procedure #2);  Surgeon: Aviva Signs, MD;  Location: [facility] ORS;  Service: General;  Laterality: N/A;  . KNEE ARTHROSCOPY Right   . NECK SURGERY    . PORTACATH PLACEMENT Right 05/13/2019   Procedure: INSERTION PORT-A-CATH (attached catheter in right subclavian) (Procedure #1);  Surgeon: Aviva Signs, MD;  Location: [facility] ORS;  Service: General;  Laterality: Right;  . ROTATOR CUFF REPAIR Right      SOCIAL HISTORY:  Social History   Socioeconomic History  . Marital status: Married    Spouse name: Not on file  . Number of children: 0  . Years of education: Not on file  . Highest education level: Not on file  Occupational History  . Occupation: retired  Tobacco Use  . Smoking status: Former Smoker    Types: Cigarettes    Quit date: 03/26/1989    Years since quitting: 30.2  . Smokeless tobacco: Never Used  Substance and Sexual Activity  . Alcohol use: No  . Drug use: No  . Sexual activity: Not Currently  Other Topics Concern  . Not on file  Social History Narrative  . Not on file   Social Determinants of Health   Financial Resource Strain: Low Risk   . Difficulty of Paying Living Expenses: Not hard at all  Food Insecurity: No Food Insecurity  . Worried About Charity fundraiser in the Last Year: Never true  . Ran Out of Food in the Last Year: Never true  Transportation Needs: No Transportation Needs  . Lack of Transportation (Medical): No  . Lack of Transportation (Non-Medical): No  Physical Activity: Inactive  . Days of Exercise per Week: 0 days  . Minutes of Exercise per Session: 0 min  Stress: No Stress Concern Present  . Feeling of Stress : Only a little  Social Connections: Not Isolated  . Frequency of Communication with Friends and Family: Three times a week  . Frequency of Social Gatherings with Friends and Family: Three times a week  . Attends Religious Services: More than 4 times per year  . Active Member of Clubs or Organizations: No  . Attends Theatre manager Meetings: More than 4 times per year  . Marital Status: Married  Human resources officer Violence: Not At Risk  . Fear of Current or Ex-Partner: No  . Emotionally Abused: No  . Physically Abused: No  . Sexually Abused: No    FAMILY HISTORY:  Family History  Adopted: Yes  Problem Relation Age of Onset  . Hypertension Brother   . Kidney disease Brother     CURRENT MEDICATIONS:  Outpatient Encounter Medications as of 06/29/2019  Medication Sig  . ALPRAZolam (XANAX) 0.5 MG tablet 1 tablet (0.5 mg total) by Per J Tube route 2 (two) times daily.  . Amino Acids-Protein Hydrolys (FEEDING SUPPLEMENT, PRO-STAT SUGAR FREE 64,) LIQD 30 mLs by Per J Tube route 2 (two) times daily.  Marland Kitchen CARBOPLATIN IV Inject into the vein once a week.  . famotidine (PEPCID) 20 MG tablet Place 1 tablet (20 mg total) into feeding tube daily.  Marland Kitchen FLUoxetine (PROZAC)  20 MG capsule Place 1 capsule (20 mg total) into feeding tube daily.  Marland Kitchen glipiZIDE (GLUCOTROL) 10 MG tablet Place 1 tablet (10 mg total) into feeding tube 2 (two) times daily before a meal.  . metFORMIN (GLUCOPHAGE) 1000 MG tablet 1 tablet (1,000 mg total) by Per J Tube route 2 (two) times daily.  . Nutritional Supplements (FEEDING SUPPLEMENT, OSMOLITE 1.5 CAL,) LIQD Place 1,000 mLs into feeding tube continuous.  Marland Kitchen PACLITAXEL IV Inject 50 mg/m2 into the vein once a week.   . pantoprazole sodium (PROTONIX) 40 mg/20 mL PACK Place 20 mLs (40 mg total) into feeding tube daily.  Marland Kitchen terazosin (HYTRIN) 10 MG capsule 1 capsule (10 mg total) by Per J Tube route daily.  . Water For Irrigation, Sterile (FREE WATER) SOLN Place 300 mLs into feeding tube every 8 (eight) hours.  Marland Kitchen acetaminophen (TYLENOL) 325 MG tablet Place 2 tablets (650 mg total) into feeding tube every 6 (six) hours as needed for mild pain (or Fever >/= 101). (Patient not taking: Reported on 06/07/2019)  . albuterol (PROVENTIL HFA;VENTOLIN HFA) 108 (90 Base) MCG/ACT inhaler Inhale 2 puffs into the  lungs every 4 (four) hours as needed for wheezing or shortness of breath.  Marland Kitchen albuterol (PROVENTIL) (2.5 MG/3ML) 0.083% nebulizer solution Take 2.5 mg by nebulization every 6 (six) hours as needed for wheezing or shortness of breath.  Marland Kitchen atorvastatin (LIPITOR) 40 MG tablet 1 tablet (40 mg total) by Per J Tube route every evening. for cholesterol (Patient not taking: Reported on 06/29/2019)  . lidocaine-prilocaine (EMLA) cream Apply a pea-sized amount to port a cath site and cover with plastic wrap one hour prior to chemotherapy appointments (Patient not taking: Reported on 06/22/2019)  . ondansetron (ZOFRAN-ODT) 4 MG disintegrating tablet Take 4 mg by mouth every 6 (six) hours as needed.  . prochlorperazine (COMPAZINE) 10 MG tablet Take 1 tablet (10 mg total) by mouth every 6 (six) hours as needed (Nausea or vomiting). (Patient not taking: Reported on 06/22/2019)  . promethazine (PHENERGAN) 25 MG suppository Place 25 mg rectally every 6 (six) hours as needed.  Marland Kitchen scopolamine (TRANSDERM-SCOP) 1 MG/3DAYS Place 1 patch (1.5 mg total) onto the skin every 3 (three) days.   No facility-administered encounter medications on file as of 06/29/2019.    ALLERGIES:  Allergies  Allergen Reactions  . Eggs Or Egg-Derived Products Nausea And Vomiting     PHYSICAL EXAM:  ECOG Performance status: 1  Vitals:   06/29/19 0957  BP: (!) 94/56  Pulse: (!) 102  Resp: 18  Temp: (!) 96.2 F (35.7 C)  SpO2: 93%   Filed Weights   06/29/19 0957  Weight: 194 lb 12.8 oz (88.4 kg)    Physical Exam Vitals reviewed.  Constitutional:      Appearance: Normal appearance.  Cardiovascular:     Rate and Rhythm: Normal rate and regular rhythm.     Heart sounds: Normal heart sounds.  Pulmonary:     Effort: Pulmonary effort is normal.     Breath sounds: Normal breath sounds.  Abdominal:     Palpations: Abdomen is soft.  Skin:    General: Skin is warm.  Neurological:     General: No focal deficit present.     Mental  Status: He is alert and oriented to person, place, and time.  Psychiatric:        Mood and Affect: Mood normal.        Behavior: Behavior normal.      LABORATORY DATA:  I have reviewed the labs as listed.  CBC    Component Value Date/Time   WBC 3.4 (L) 06/29/2019 0905   RBC 3.15 (L) 06/29/2019 0905   HGB 9.5 (L) 06/29/2019 0905   HCT 28.2 (L) 06/29/2019 0905   PLT 166 06/29/2019 0905   MCV 89.5 06/29/2019 0905   MCH 30.2 06/29/2019 0905   MCHC 33.7 06/29/2019 0905   RDW 14.2 06/29/2019 0905   LYMPHSABS 0.3 (L) 06/29/2019 0905   MONOABS 0.3 06/29/2019 0905   EOSABS 0.0 06/29/2019 0905   BASOSABS 0.0 06/29/2019 0905   CMP Latest Ref Rng & Units 06/29/2019 06/22/2019 06/15/2019  Glucose 70 - 99 mg/dL 202(H) 164(H) 148(H)  BUN 8 - 23 mg/dL 15 15 18   Creatinine 0.61 - 1.24 mg/dL 0.57(L) 0.58(L) 0.60(L)  Sodium 135 - 145 mmol/L 133(L) 133(L) 133(L)  Potassium 3.5 - 5.1 mmol/L 3.3(L) 3.7 4.0  Chloride 98 - 111 mmol/L 98 97(L) 98  CO2 22 - 32 mmol/L 24 25 26   Calcium 8.9 - 10.3 mg/dL 8.2(L) 8.5(L) 8.6(L)  Total Protein 6.5 - 8.1 g/dL 6.0(L) 6.2(L) 6.3(L)  Total Bilirubin 0.3 - 1.2 mg/dL 0.4 0.5 0.7  Alkaline Phos 38 - 126 U/L 79 85 96  AST 15 - 41 U/L 23 19 53(H)  ALT 0 - 44 U/L 24 29 80(H)       DIAGNOSTIC IMAGING:  I have reviewed the scans and hospital records.     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  GE junction adenocarcinoma ([place] N2 M0): -PET scan on 05/23/2019 showed hypermetabolic distal esophageal mass.  Gastrohepatic and right iliac and left para-aortic lymph nodes were positive. -He was evaluated by Dr. Kipp Brood and thought to be a candidate for surgery after completion of chemoradiation therapy. -2 weekly doses of carboplatin and paclitaxel and XRT from 06/15/2019 through 06/22/2019. -He was hospitalized at [facility] from 06/27/2019 through 06/28/2019 with nausea and regurgitation of sputum. -He received IV hydration.  I reviewed the  records from the hospital.  He gained 2 pounds. -He was released from the hospital yesterday.  He missed radiation Monday and Tuesday.  He started back radiation today. -I reviewed his labs.  White count is 3.4 with 80% neutrophils.  Platelets are normal.  Creatinine is normal. -I will proceed with week 3 of treatment today.  2.  Secretions: -He has a lot of production of sputum, unable to swallow.  He is spitting it out. -I will start him on scopolamine patch, hopefully to dry up the secretions.  3.  Weight loss: -He lost about 30 pounds in the last 2 to 3 months prior to presentation. -His weight has been stable to slight gain.  He is taking in 6 cans of Osmolite 1.5 from 3 PM to 7 AM continuously. -He is also on Protostat 30 mL twice daily.  4.  Peripheral neuropathy: -He has on and off numbness in the hands and feet from diabetes. -He was on gabapentin which was discontinued at diagnosis. -We will closely monitor his neuropathy as chemotherapy can worsen it.      Orders placed this encounter:  No orders of the defined types were placed in this encounter.    Derek Jack, MD [place] [phone]

## 2019-06-29 NOTE — Patient Instructions (Addendum)
[facility] at [facility] Discharge Instructions  You were seen today by Dr. Katragadda. He went over your recent lab results. He will see you back in 1 week for labs, treatment and follow up.   Thank you for choosing [facility] at [facility] to provide your oncology and hematology care.  To afford each patient quality time with our provider, please arrive at least 15 minutes before your scheduled appointment time.   If you have a lab appointment with the Cancer Center please come in thru the  Main Entrance and check in at the main information desk  You need to re-schedule your appointment should you arrive 10 or more minutes late.  We strive to give you quality time with our providers, and arriving late affects you and other patients whose appointments are after yours.  Also, if you no show three or more times for appointments you may be dismissed from the clinic at the providers discretion.     Again, thank you for choosing [facility].  Our hope is that these requests will decrease the amount of time that you wait before being seen by our physicians.       _____________________________________________________________  Should you have questions after your visit to  Cancer Center, please contact our office at [phone] between the hours of 8:00 a.m. and 4:30 p.m.  Voicemails left after 4:00 p.m. will not be returned until the following business day.  For prescription refill requests, have your pharmacy contact our office and allow 72 hours.    Cancer Center Support Programs:   > Cancer Support Group  2nd Tuesday of the month 1pm-2pm, Journey Room    

## 2019-06-29 NOTE — Progress Notes (Signed)
Patient has been assessed, vital signs and labs have been reviewed by Dr. Delton Coombes. ANC, Creatinine, LFTs, and Platelets are within treatment parameters per Dr. Delton Coombes. The patient is good to proceed with treatment at this time. Please apply scope patch today per Dr. Delton Coombes.

## 2019-06-29 NOTE — Patient Instructions (Signed)
[facility] Discharge Instructions for Patients Receiving Chemotherapy  Today you received the following chemotherapy agents   To help prevent nausea and vomiting after your treatment, we encourage you to take your nausea medication   If you develop nausea and vomiting that is not controlled by your nausea medication, call the clinic.   BELOW ARE SYMPTOMS THAT SHOULD BE REPORTED IMMEDIATELY:  *FEVER GREATER THAN 100.5 F  *CHILLS WITH OR WITHOUT FEVER  NAUSEA AND VOMITING THAT IS NOT CONTROLLED WITH YOUR NAUSEA MEDICATION  *UNUSUAL SHORTNESS OF BREATH  *UNUSUAL BRUISING OR BLEEDING  TENDERNESS IN MOUTH AND THROAT WITH OR WITHOUT PRESENCE OF ULCERS  *URINARY PROBLEMS  *BOWEL PROBLEMS  UNUSUAL RASH Items with * indicate a potential emergency and should be followed up as soon as possible.  Feel free to call the clinic should you have any questions or concerns. The clinic phone number is [phone].  Please show the CHEMO ALERT CARD at check-in to the Emergency Department and triage nurse.   

## 2019-06-30 DIAGNOSIS — I10 Essential (primary) hypertension: Secondary | ICD-10-CM | POA: Diagnosis not present

## 2019-06-30 DIAGNOSIS — C159 Malignant neoplasm of esophagus, unspecified: Secondary | ICD-10-CM | POA: Diagnosis not present

## 2019-06-30 DIAGNOSIS — Z452 Encounter for adjustment and management of vascular access device: Secondary | ICD-10-CM | POA: Diagnosis not present

## 2019-06-30 DIAGNOSIS — J449 Chronic obstructive pulmonary disease, unspecified: Secondary | ICD-10-CM | POA: Diagnosis not present

## 2019-06-30 DIAGNOSIS — Z483 Aftercare following surgery for neoplasm: Secondary | ICD-10-CM | POA: Diagnosis not present

## 2019-06-30 DIAGNOSIS — Z934 Other artificial openings of gastrointestinal tract status: Secondary | ICD-10-CM | POA: Diagnosis not present

## 2019-06-30 DIAGNOSIS — C155 Malignant neoplasm of lower third of esophagus: Secondary | ICD-10-CM | POA: Diagnosis not present

## 2019-06-30 NOTE — Progress Notes (Signed)

## 2019-07-01 ENCOUNTER — Ambulatory Visit (HOSPITAL_COMMUNITY): Payer: Medicare Other

## 2019-07-01 DIAGNOSIS — C155 Malignant neoplasm of lower third of esophagus: Secondary | ICD-10-CM | POA: Diagnosis not present

## 2019-07-01 NOTE — Progress Notes (Signed)
Nutrition Follow-up:  Patient with esophageal cancer.  Patient has started concurrent chemotherapy and radiation therapy.    Spoke with wife Jana Half via phone.  Patient continues to tolerate osmolite 1/5 continuous at 74ml/hr for 16 hours (3pm-7am), 6 cartons per day via J-tube. Wife giving prosource 4ml q daily.    Noted recent hospital admission with increased nausea, excessive sputum production.  Wife reports she thinks patch has help secretions some.  Patient complaining of bitter, acid taste in his mouth from spit.    Medications: reviewed  Labs: Na 133  Anthropometrics:   Weight 194 lb (IV hydration) increased from 192 lb on 3/17   Estimated Energy Needs  Kcals: 2150-2580 Protein: 107-129 g Fluid: > 2 L  NUTRITION DIAGNOSIS: Inadequate oral intake continues but relying on tube feeding for nutrition  INTERVENTION:  Continue osmolite 1.5, 6 cartons per day (23ml/hr from 3pm-7am).  Wife flushing with at least 103ml additional water for better hydration.   Recommend increasing prosource to 20ml q daily.  Wife will give prosource 44ml at noon and 57ml at 3pm before hooking up to pump.  Reviewed flushing tube well before and after and mixing with water.   Wife reports feeding tube clogged while in the hospital.  Reviewed how to unclog tube at home if happens.  Wife reports that she is expecting delivery of enteral supplies at the beginning of the month.   Wife has contact information.    MONITORING, EVALUATION, GOAL: Patient will utilize feeding tube to maintain weight during treatment   NEXT VISIT: April 16th via phone  Melady Chow B. Zenia Resides, [place], [place] Registered Dietitian [phone] (pager)

## 2019-07-04 DIAGNOSIS — C155 Malignant neoplasm of lower third of esophagus: Secondary | ICD-10-CM | POA: Diagnosis not present

## 2019-07-05 ENCOUNTER — Other Ambulatory Visit (HOSPITAL_COMMUNITY): Payer: Self-pay | Admitting: *Deleted

## 2019-07-05 DIAGNOSIS — E1142 Type 2 diabetes mellitus with diabetic polyneuropathy: Secondary | ICD-10-CM | POA: Diagnosis not present

## 2019-07-05 DIAGNOSIS — C155 Malignant neoplasm of lower third of esophagus: Secondary | ICD-10-CM | POA: Diagnosis not present

## 2019-07-05 DIAGNOSIS — E1165 Type 2 diabetes mellitus with hyperglycemia: Secondary | ICD-10-CM | POA: Diagnosis not present

## 2019-07-05 DIAGNOSIS — F419 Anxiety disorder, unspecified: Secondary | ICD-10-CM | POA: Diagnosis not present

## 2019-07-05 DIAGNOSIS — C159 Malignant neoplasm of esophagus, unspecified: Secondary | ICD-10-CM

## 2019-07-05 DIAGNOSIS — F329 Major depressive disorder, single episode, unspecified: Secondary | ICD-10-CM | POA: Diagnosis not present

## 2019-07-05 DIAGNOSIS — Z299 Encounter for prophylactic measures, unspecified: Secondary | ICD-10-CM | POA: Diagnosis not present

## 2019-07-05 DIAGNOSIS — Z09 Encounter for follow-up examination after completed treatment for conditions other than malignant neoplasm: Secondary | ICD-10-CM | POA: Diagnosis not present

## 2019-07-06 ENCOUNTER — Inpatient Hospital Stay (HOSPITAL_COMMUNITY): Payer: Medicare Other

## 2019-07-06 ENCOUNTER — Other Ambulatory Visit: Payer: Self-pay

## 2019-07-06 ENCOUNTER — Inpatient Hospital Stay (HOSPITAL_BASED_OUTPATIENT_CLINIC_OR_DEPARTMENT_OTHER): Payer: Medicare Other | Admitting: Hematology

## 2019-07-06 VITALS — BP 129/62 | HR 89 | Temp 97.2°F | Resp 18

## 2019-07-06 DIAGNOSIS — R05 Cough: Secondary | ICD-10-CM | POA: Diagnosis not present

## 2019-07-06 DIAGNOSIS — K2081 Other esophagitis with bleeding: Secondary | ICD-10-CM | POA: Diagnosis not present

## 2019-07-06 DIAGNOSIS — K8020 Calculus of gallbladder without cholecystitis without obstruction: Secondary | ICD-10-CM | POA: Diagnosis not present

## 2019-07-06 DIAGNOSIS — C159 Malignant neoplasm of esophagus, unspecified: Secondary | ICD-10-CM

## 2019-07-06 DIAGNOSIS — E860 Dehydration: Secondary | ICD-10-CM

## 2019-07-06 DIAGNOSIS — R000 Tachycardia, unspecified: Secondary | ICD-10-CM | POA: Diagnosis not present

## 2019-07-06 DIAGNOSIS — D61810 Antineoplastic chemotherapy induced pancytopenia: Secondary | ICD-10-CM | POA: Diagnosis not present

## 2019-07-06 DIAGNOSIS — R0602 Shortness of breath: Secondary | ICD-10-CM | POA: Diagnosis not present

## 2019-07-06 DIAGNOSIS — R509 Fever, unspecified: Secondary | ICD-10-CM | POA: Diagnosis not present

## 2019-07-06 DIAGNOSIS — A419 Sepsis, unspecified organism: Secondary | ICD-10-CM | POA: Diagnosis not present

## 2019-07-06 DIAGNOSIS — R531 Weakness: Secondary | ICD-10-CM | POA: Diagnosis not present

## 2019-07-06 DIAGNOSIS — Z95828 Presence of other vascular implants and grafts: Secondary | ICD-10-CM

## 2019-07-06 DIAGNOSIS — Z20822 Contact with and (suspected) exposure to covid-19: Secondary | ICD-10-CM | POA: Diagnosis not present

## 2019-07-06 DIAGNOSIS — A408 Other streptococcal sepsis: Secondary | ICD-10-CM | POA: Diagnosis not present

## 2019-07-06 DIAGNOSIS — Z66 Do not resuscitate: Secondary | ICD-10-CM | POA: Diagnosis not present

## 2019-07-06 DIAGNOSIS — C155 Malignant neoplasm of lower third of esophagus: Secondary | ICD-10-CM | POA: Diagnosis not present

## 2019-07-06 DIAGNOSIS — R6521 Severe sepsis with septic shock: Secondary | ICD-10-CM | POA: Diagnosis not present

## 2019-07-06 DIAGNOSIS — D709 Neutropenia, unspecified: Secondary | ICD-10-CM | POA: Diagnosis not present

## 2019-07-06 LAB — COMPREHENSIVE METABOLIC PANEL WITH GFR
ALT: 27 U/L (ref 0–44)
AST: 21 U/L (ref 15–41)
Albumin: 2.6 g/dL — ABNORMAL LOW (ref 3.5–5.0)
Alkaline Phosphatase: 88 U/L (ref 38–126)
Anion gap: 12 (ref 5–15)
BUN: 11 mg/dL (ref 8–23)
CO2: 27 mmol/L (ref 22–32)
Calcium: 8.2 mg/dL — ABNORMAL LOW (ref 8.9–10.3)
Chloride: 90 mmol/L — ABNORMAL LOW (ref 98–111)
Creatinine, Ser: 0.51 mg/dL — ABNORMAL LOW (ref 0.61–1.24)
GFR calc Af Amer: >60 mL/min
GFR calc non Af Amer: >60 mL/min
Glucose, Bld: 178 mg/dL — ABNORMAL HIGH (ref 70–99)
Potassium: 3.9 mmol/L (ref 3.5–5.1)
Sodium: 129 mmol/L — ABNORMAL LOW (ref 135–145)
Total Bilirubin: 0.4 mg/dL (ref 0.3–1.2)
Total Protein: 5.8 g/dL — ABNORMAL LOW (ref 6.5–8.1)

## 2019-07-06 LAB — CBC WITH DIFFERENTIAL/PLATELET
Abs Immature Granulocytes: 0.04 10*3/uL (ref 0.00–0.07)
Basophils Absolute: 0 10*3/uL (ref 0.0–0.1)
Basophils Relative: 0 %
Eosinophils Absolute: 0 10*3/uL (ref 0.0–0.5)
Eosinophils Relative: 0 %
HCT: 27.7 % — ABNORMAL LOW (ref 39.0–52.0)
Hemoglobin: 9.5 g/dL — ABNORMAL LOW (ref 13.0–17.0)
Immature Granulocytes: 1 %
Lymphocytes Relative: 5 %
Lymphs Abs: 0.2 10*3/uL — ABNORMAL LOW (ref 0.7–4.0)
MCH: 30.5 pg (ref 26.0–34.0)
MCHC: 34.3 g/dL (ref 30.0–36.0)
MCV: 89.1 fL (ref 80.0–100.0)
Monocytes Absolute: 0.3 10*3/uL (ref 0.1–1.0)
Monocytes Relative: 9 %
Neutro Abs: 2.7 10*3/uL (ref 1.7–7.7)
Neutrophils Relative %: 85 %
Platelets: 142 10*3/uL — ABNORMAL LOW (ref 150–400)
RBC: 3.11 MIL/uL — ABNORMAL LOW (ref 4.22–5.81)
RDW: 13.7 % (ref 11.5–15.5)
WBC: 3.2 10*3/uL — ABNORMAL LOW (ref 4.0–10.5)
nRBC: 0 % (ref 0.0–0.2)

## 2019-07-06 LAB — MAGNESIUM: Magnesium: 1.6 mg/dL — ABNORMAL LOW (ref 1.7–2.4)

## 2019-07-06 MED ORDER — DIPHENHYDRAMINE HCL 50 MG/ML IJ SOLN
50.0000 mg | Freq: Once | INTRAMUSCULAR | Status: AC
  Administered 2019-07-06: 50 mg via INTRAVENOUS
  Filled 2019-07-06: qty 1

## 2019-07-06 MED ORDER — SODIUM CHLORIDE 0.9 % IV SOLN
50.0000 mg/m2 | Freq: Once | INTRAVENOUS | Status: AC
  Administered 2019-07-06: 108 mg via INTRAVENOUS
  Filled 2019-07-06: qty 18

## 2019-07-06 MED ORDER — SODIUM CHLORIDE 0.9% FLUSH
10.0000 mL | INTRAVENOUS | Status: DC | PRN
  Administered 2019-07-06: 10 mL

## 2019-07-06 MED ORDER — SODIUM CHLORIDE 0.9 % IV SOLN
Freq: Once | INTRAVENOUS | Status: AC
  Filled 2019-07-06: qty 1000

## 2019-07-06 MED ORDER — SODIUM CHLORIDE 0.9 % IV SOLN
216.0000 mg | Freq: Once | INTRAVENOUS | Status: AC
  Administered 2019-07-06: 220 mg via INTRAVENOUS
  Filled 2019-07-06: qty 22

## 2019-07-06 MED ORDER — HEPARIN SOD (PORK) LOCK FLUSH 100 UNIT/ML IV SOLN
500.0000 [IU] | Freq: Once | INTRAVENOUS | Status: AC | PRN
  Administered 2019-07-06: 500 [IU]

## 2019-07-06 MED ORDER — FAMOTIDINE IN NACL 20-0.9 MG/50ML-% IV SOLN
20.0000 mg | Freq: Once | INTRAVENOUS | Status: AC
  Administered 2019-07-06: 20 mg via INTRAVENOUS
  Filled 2019-07-06: qty 50

## 2019-07-06 MED ORDER — SODIUM CHLORIDE 0.9 % IV SOLN
20.0000 mg | Freq: Once | INTRAVENOUS | Status: AC
  Administered 2019-07-06: 20 mg via INTRAVENOUS
  Filled 2019-07-06: qty 20

## 2019-07-06 MED ORDER — SODIUM CHLORIDE 0.9 % IV SOLN: Freq: Once | INTRAVENOUS | Status: AC

## 2019-07-06 MED ORDER — PALONOSETRON HCL INJECTION 0.25 MG/5ML
0.2500 mg | Freq: Once | INTRAVENOUS | Status: AC
  Administered 2019-07-06: 0.25 mg via INTRAVENOUS
  Filled 2019-07-06: qty 5

## 2019-07-06 NOTE — Progress Notes (Signed)
Patient has been assessed, vital signs and labs have been reviewed by Dr. Delton Coombes. ANC, Creatinine, LFTs, and Platelets are within treatment parameters, sodium and magnesium are low today, give 1 liter NS with electrolytes over 2 hours per Dr. Delton Coombes. The patient is good to proceed with treatment at this time.

## 2019-07-06 NOTE — Patient Instructions (Signed)
[facility] Discharge Instructions for Patients Receiving Chemotherapy  Today you received the following chemotherapy agents   To help prevent nausea and vomiting after your treatment, we encourage you to take your nausea medication   If you develop nausea and vomiting that is not controlled by your nausea medication, call the clinic.   BELOW ARE SYMPTOMS THAT SHOULD BE REPORTED IMMEDIATELY:  *FEVER GREATER THAN 100.5 F  *CHILLS WITH OR WITHOUT FEVER  NAUSEA AND VOMITING THAT IS NOT CONTROLLED WITH YOUR NAUSEA MEDICATION  *UNUSUAL SHORTNESS OF BREATH  *UNUSUAL BRUISING OR BLEEDING  TENDERNESS IN MOUTH AND THROAT WITH OR WITHOUT PRESENCE OF ULCERS  *URINARY PROBLEMS  *BOWEL PROBLEMS  UNUSUAL RASH Items with * indicate a potential emergency and should be followed up as soon as possible.  Feel free to call the clinic should you have any questions or concerns. The clinic phone number is [phone].  Please show the CHEMO ALERT CARD at check-in to the Emergency Department and triage nurse.   

## 2019-07-06 NOTE — Assessment & Plan Note (Addendum)
1.  GE junction adenocarcinoma (TX N2 M0): -PET scan on 05/23/2019 showed hypermetabolic distal esophageal mass.  Gastrohepatic and right iliac and left para aortic lymph nodes were positive. -He was evaluated by Dr. Kipp Brood and thought to be a candidate for surgery upon completion of chemoradiation therapy. -3 weekly dose of carboplatin and paclitaxel and XRT from 06/15/2019 through 06/29/2019. -He does not report any major side effects from last treatment. -I reviewed his labs.  White count is mildly low but ANC is normal.  Platelets are normal.  Creatinine is also normal. -He will proceed with week 4 of treatment today.  I will reevaluate him in 1 week.  2.  Secretions: -He has a lot of frothy sputum production which he is spitting out. -We started him on scopolamine patch.  However it caused him cough.  He stopped it over the weekend.  3.  Weight loss: -Lost about 30 pounds in the last 2 to 3 months prior to presentation. -He is taking in 6 cans of Osmolite 1.5 from 3 PM to 7 AM continuously. -Prostat was increased to 30 mL 3 times daily.  4.  Hypomagnesemia and hyponatremia: -Magnesium is low at 1.6.  Sodium is also low at 129. -He will receive normal saline with magnesium.

## 2019-07-06 NOTE — Progress Notes (Signed)
Kurt Duran, [place] [address]   CLINIC:  Medical Oncology/Hematology  PCP:  Glenda Chroman, MD [place] [id number] [phone]   REASON FOR VISIT:  Follow-up for GE junction adenocarcinoma.  CURRENT THERAPY: Chemoradiation therapy.  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   06/07/2019 Cancer Staging   Staging form: Esophagus - Adenocarcinoma, AJCC 8th Edition - Clinical: Stage Unknown (cTX, cN2, cM0, GX) - Signed by Derek Jack, MD on 06/07/2019   06/15/2019 -  Chemotherapy   The patient had palonosetron (ALOXI) injection 0.25 mg, 0.25 mg, Intravenous,  Once, 1 of 1 cycle Administration: 0.25 mg (06/15/2019), 0.25 mg (06/22/2019), 0.25 mg (06/29/2019), 0.25 mg (07/06/2019) CARBOplatin (PARAPLATIN) 220 mg in sodium chloride 0.9 % 250 mL chemo infusion, 220 mg (100 % of original dose 216 mg), Intravenous,  Once, 1 of 1 cycle Dose modification:   (original dose 216 mg, Cycle 1),   (original dose 216 mg, Cycle 1),   (original dose 216 mg, Cycle 1),   (original dose 216 mg, Cycle 1) Administration: 220 mg (06/15/2019), 220 mg (06/22/2019), 220 mg (06/29/2019), 220 mg (07/06/2019) PACLitaxel (TAXOL) 108 mg in sodium chloride 0.9 % 250 mL chemo infusion (</= 80mg /m2), 50 mg/m2 = 108 mg, Intravenous,  Once, 1 of 1 cycle Administration: 108 mg (06/15/2019), 108 mg (06/22/2019), 108 mg (06/29/2019), 108 mg (07/06/2019)  for chemotherapy treatment.       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 male seen for follow-up of GE junction adenocarcinoma and toxicity assessment prior to week 4 of chemotherapy.  He  reportedly developed cough with scopolamine patch and stopped using it over the weekend.  Reports appetite 100%.  Energy levels are 25%.  Still spitting out frothy sputum.  Has nausea but no vomiting.  Tolerating tube feeds very well.  Denies any fevers or chills.  REVIEW OF SYSTEMS:  Review of Systems  HENT:   Positive for trouble swallowing.   Respiratory: Positive for cough.   Gastrointestinal: Positive for nausea.  Neurological: Positive for dizziness.  All other systems reviewed and are negative.    PAST MEDICAL/SURGICAL HISTORY:  Past Medical History:  Diagnosis Date  . Anxiety   . Arthritis   . COPD (chronic obstructive pulmonary disease) ([place])   . Diabetes mellitus without complication ([place])   . Diabetic retinopathy ([place])    NPDR OU  . Esophageal cancer ([place])   . GERD (gastroesophageal reflux disease)   . Gout   . Hypertension   . Hypertensive retinopathy    OU  . Port-A-Cath in place 06/07/2019   Past Surgical History:  Procedure Laterality Date  . BIOPSY  05/10/2019   Procedure: BIOPSY;  Surgeon: Danie Binder, MD;  Location: [facility];  Service: Endoscopy;;  gastric esophageal  . CATARACT EXTRACTION W/PHACO Left 04/03/2016   Procedure: CATARACT EXTRACTION PHACO AND INTRAOCULAR LENS PLACEMENT LEFT EYE CDE= 11.33;  Surgeon: Tonny Branch, MD;  Location: [facility] OR[facility];  Service: Ophthalmology;  Laterality: Left;  left  . CATARACT EXTRACTION W/PHACO Right 04/28/2016   Procedure: CATARACT EXTRACTION PHACO AND INTRAOCULAR LENS PLACEMENT (IOC);  Surgeon: Tonny Branch, MD;  Location: [facility];  Service:  Ophthalmology;  Laterality: Right;  cde-10.23   . ESOPHAGOGASTRODUODENOSCOPY (EGD) WITH PROPOFOL N/A 05/10/2019   Procedure: ESOPHAGOGASTRODUODENOSCOPY (EGD) WITH PROPOFOL with dilation as appropriate.;  Surgeon: Danie Binder, MD;  Location: [facility];  Service: Endoscopy;  Laterality: N/A;  . EYE SURGERY Bilateral    Cat Sx OU  . JEJUNOSTOMY N/A 05/13/2019   Procedure:  JEJUNOSTOMY FEEDING TUBE PLACEMENT (Procedure #2);  Surgeon: Aviva Signs, MD;  Location: [facility];  Service: General;  Laterality: N/A;  . KNEE ARTHROSCOPY Right   . NECK SURGERY    . PORTACATH PLACEMENT Right 05/13/2019   Procedure: INSERTION PORT-A-CATH (attached catheter in right subclavian) (Procedure #1);  Surgeon: Aviva Signs, MD;  Location: [facility];  Service: General;  Laterality: Right;  . ROTATOR CUFF REPAIR Right      SOCIAL HISTORY:  Social History   Socioeconomic History  . Marital status: Married    Spouse name: Not on file  . Number of children: 0  . Years of education: Not on file  . Highest education level: Not on file  Occupational History  . Occupation: retired  Tobacco Use  . Smoking status: Former Smoker    Types: Cigarettes    Quit date: 03/26/1989    Years since quitting: 30.2  . Smokeless tobacco: Never Used  Substance and Sexual Activity  . Alcohol use: No  . Drug use: No  . Sexual activity: Not Currently  Other Topics Concern  . Not on file  Social History Narrative  . Not on file   Social Determinants of Health   Financial Resource Strain: Low Risk   . Difficulty of Paying Living Expenses: Not hard at all  Food Insecurity: No Food Insecurity  . Worried About Charity fundraiser in the Last Year: Never true  . Ran Out of Food in the Last Year: Never true  Transportation Needs: No Transportation Needs  . Lack of Transportation (Medical): No  . Lack of Transportation (Non-Medical): No  Physical Activity: Inactive  . Days of Exercise per Week: 0 days  . Minutes of Exercise per Session: 0 min  Stress: No Stress Concern Present  . Feeling of Stress : Only a little  Social Connections: Not Isolated  . Frequency of Communication with Friends and Family: Three times a week  . Frequency of Social Gatherings with Friends and Family: Three times a week  . Attends Religious Services: More than 4 times per year  . Active Member of Clubs or  Organizations: No  . Attends Archivist Meetings: More than 4 times per year  . Marital Status: Married  Human resources officer Violence: Not At Risk  . Fear of Current or Ex-Partner: No  . Emotionally Abused: No  . Physically Abused: No  . Sexually Abused: No    FAMILY HISTORY:  Family History  Adopted: Yes  Problem Relation Age of Onset  . Hypertension Brother   . Kidney disease Brother     CURRENT MEDICATIONS:  Outpatient Encounter Medications as of 07/06/2019  Medication Sig  . ALPRAZolam (XANAX) 0.5 MG tablet 1 tablet (0.5 mg total) by Per J Tube route 2 (two) times daily.  . Amino Acids-Protein Hydrolys (FEEDING SUPPLEMENT, PRO-STAT SUGAR FREE 64,) LIQD 30 mLs by Per J Tube route 2 (two) times daily.  Marland Kitchen CARBOPLATIN IV Inject into the vein once a week.  . famotidine (PEPCID) 20 MG tablet Place 1 tablet (20 mg total) into feeding tube daily.  Marland Kitchen FLUoxetine (PROZAC) 20 MG capsule  Place 1 capsule (20 mg total) into feeding tube daily.  Marland Kitchen glipiZIDE (GLUCOTROL) 10 MG tablet Place 1 tablet (10 mg total) into feeding tube 2 (two) times daily before a meal.  . metFORMIN (GLUCOPHAGE) 1000 MG tablet 1 tablet (1,000 mg total) by Per J Tube route 2 (two) times daily.  . Nutritional Supplements (FEEDING SUPPLEMENT, OSMOLITE 1.5 CAL,) LIQD Place 1,000 mLs into feeding tube continuous.  Marland Kitchen PACLITAXEL IV Inject 50 mg/m2 into the vein once a week.   . pantoprazole sodium (PROTONIX) 40 mg/20 mL PACK Place 20 mLs (40 mg total) into feeding tube daily.  Marland Kitchen terazosin (HYTRIN) 10 MG capsule 1 capsule (10 mg total) by Per J Tube route daily.  . Water For Irrigation, Sterile (FREE WATER) SOLN Place 300 mLs into feeding tube every 8 (eight) hours.  Marland Kitchen acetaminophen (TYLENOL) 325 MG tablet Place 2 tablets (650 mg total) into feeding tube every 6 (six) hours as needed for mild pain (or Fever >/= 101). (Patient not taking: Reported on 06/07/2019)  . albuterol (PROVENTIL HFA;VENTOLIN HFA) 108 (90 Base)  MCG/ACT inhaler Inhale 2 puffs into the lungs every 4 (four) hours as needed for wheezing or shortness of breath.  Marland Kitchen albuterol (PROVENTIL) (2.5 MG/3ML) 0.083% nebulizer solution Take 2.5 mg by nebulization every 6 (six) hours as needed for wheezing or shortness of breath.  Marland Kitchen atorvastatin (LIPITOR) 40 MG tablet 1 tablet (40 mg total) by Per J Tube route every evening. for cholesterol (Patient not taking: Reported on 06/29/2019)  . lidocaine-prilocaine (EMLA) cream Apply a pea-sized amount to port a cath site and cover with plastic wrap one hour prior to chemotherapy appointments (Patient not taking: Reported on 06/22/2019)  . ondansetron (ZOFRAN-ODT) 4 MG disintegrating tablet Take 4 mg by mouth every 6 (six) hours as needed.  . prochlorperazine (COMPAZINE) 10 MG tablet Take 1 tablet (10 mg total) by mouth every 6 (six) hours as needed (Nausea or vomiting). (Patient not taking: Reported on 06/22/2019)  . promethazine (PHENERGAN) 25 MG suppository Place 25 mg rectally every 6 (six) hours as needed.  Marland Kitchen scopolamine (TRANSDERM-SCOP) 1 MG/3DAYS Place 1 patch (1.5 mg total) onto the skin every 3 (three) days. (Patient not taking: Reported on 07/06/2019)   No facility-administered encounter medications on file as of 07/06/2019.    ALLERGIES:  Allergies  Allergen Reactions  . Eggs Or Egg-Derived Products Nausea And Vomiting     PHYSICAL EXAM:  ECOG Performance status: 1  Vitals:   07/06/19 0846  BP: (!) 115/53  Pulse: 99  Resp: 18  Temp: (!) 96.7 F (35.9 C)  SpO2: 100%   Filed Weights   07/06/19 0846  Weight: 192 lb 6.4 oz (87.3 kg)    Physical Exam Vitals reviewed.  Constitutional:      Appearance: Normal appearance.  Cardiovascular:     Rate and Rhythm: Normal rate and regular rhythm.     Heart sounds: Normal heart sounds.  Pulmonary:     Effort: Pulmonary effort is normal.     Breath sounds: Normal breath sounds.  Abdominal:     Palpations: Abdomen is soft.  Skin:    General:  Skin is warm.  Neurological:     General: No focal deficit present.     Mental Status: He is alert and oriented to person, place, and time.  Psychiatric:        Mood and Affect: Mood normal.        Behavior: Behavior normal.      LABORATORY  DATA:  I have reviewed the labs as listed.  CBC    Component Value Date/Time   WBC 3.2 (L) 07/06/2019 0852   RBC 3.11 (L) 07/06/2019 0852   HGB 9.5 (L) 07/06/2019 0852   HCT 27.7 (L) 07/06/2019 0852   PLT 142 (L) 07/06/2019 0852   MCV 89.1 07/06/2019 0852   MCH 30.5 07/06/2019 0852   MCHC 34.3 07/06/2019 0852   RDW 13.7 07/06/2019 0852   LYMPHSABS 0.2 (L) 07/06/2019 0852   MONOABS 0.3 07/06/2019 0852   EOSABS 0.0 07/06/2019 0852   BASOSABS 0.0 07/06/2019 0852   CMP Latest Ref Rng & Units 07/06/2019 06/29/2019 06/22/2019  Glucose 70 - 99 mg/dL 178(H) 202(H) 164(H)  BUN 8 - 23 mg/dL 11 15 15   Creatinine 0.61 - 1.24 mg/dL 0.51(L) 0.57(L) 0.58(L)  Sodium 135 - 145 mmol/L 129(L) 133(L) 133(L)  Potassium 3.5 - 5.1 mmol/L 3.9 3.3(L) 3.7  Chloride 98 - 111 mmol/L 90(L) 98 97(L)  CO2 22 - 32 mmol/L 27 24 25   Calcium 8.9 - 10.3 mg/dL 8.2(L) 8.2(L) 8.5(L)  Total Protein 6.5 - 8.1 g/dL 5.8(L) 6.0(L) 6.2(L)  Total Bilirubin 0.3 - 1.2 mg/dL 0.4 0.4 0.5  Alkaline Phos 38 - 126 U/L 88 79 85  AST 15 - 41 U/L 21 23 19   ALT 0 - 44 U/L 27 24 29        DIAGNOSTIC IMAGING:  I have reviewed scans.     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  GE junction adenocarcinoma (TX N2 M0): -PET scan on 05/23/2019 showed hypermetabolic distal esophageal mass.  Gastrohepatic and right iliac and left para aortic lymph nodes were positive. -He was evaluated by Dr. Kipp Brood and thought to be a candidate for surgery upon completion of chemoradiation therapy. -3 weekly dose of carboplatin and paclitaxel and XRT from 06/15/2019 through 06/29/2019. -He does not report any major side effects from last treatment. -I reviewed his labs.  White  count is mildly low but ANC is normal.  Platelets are normal.  Creatinine is also normal. -He will proceed with week 4 of treatment today.  I will reevaluate him in 1 week.  2.  Secretions: -He has a lot of frothy sputum production which he is spitting out. -We started him on scopolamine patch.  However it caused him cough.  He stopped it over the weekend.  3.  Weight loss: -Lost about 30 pounds in the last 2 to 3 months prior to presentation. -He is taking in 6 cans of Osmolite 1.5 from 3 PM to 7 AM continuously. -Prostat was increased to 30 mL 3 times daily.  4.  Hypomagnesemia and hyponatremia: -Magnesium is low at 1.6.  Sodium is also low at 129. -He will receive normal saline with magnesium.      Orders placed this encounter:  No orders of the defined types were placed in this encounter.    Derek Jack, MD [place] [phone]

## 2019-07-06 NOTE — Patient Instructions (Signed)
[facility] at [facility] Discharge Instructions  You were seen today by Dr. Katragadda. He went over your recent lab results. He will see you back in 1 week for labs, treatment and follow up.   Thank you for choosing [facility] at [facility] to provide your oncology and hematology care.  To afford each patient quality time with our provider, please arrive at least 15 minutes before your scheduled appointment time.   If you have a lab appointment with the Cancer Center please come in thru the  Main Entrance and check in at the main information desk  You need to re-schedule your appointment should you arrive 10 or more minutes late.  We strive to give you quality time with our providers, and arriving late affects you and other patients whose appointments are after yours.  Also, if you no show three or more times for appointments you may be dismissed from the clinic at the providers discretion.     Again, thank you for choosing [facility].  Our hope is that these requests will decrease the amount of time that you wait before being seen by our physicians.       _____________________________________________________________  Should you have questions after your visit to  Cancer Center, please contact our office at [phone] between the hours of 8:00 a.m. and 4:30 p.m.  Voicemails left after 4:00 p.m. will not be returned until the following business day.  For prescription refill requests, have your pharmacy contact our office and allow 72 hours.    Cancer Center Support Programs:   > Cancer Support Group  2nd Tuesday of the month 1pm-2pm, Journey Room    

## 2019-07-06 NOTE — Progress Notes (Signed)
Labs reviewed today with MD. Will given treatment in addition to hydration fluids per orders.    Treatment given per orders. Patient tolerated it well without problems. Vitals stable and discharged home from clinic via wheelchair. Follow up as scheduled.

## 2019-07-07 DIAGNOSIS — C155 Malignant neoplasm of lower third of esophagus: Secondary | ICD-10-CM | POA: Diagnosis not present

## 2019-07-08 ENCOUNTER — Other Ambulatory Visit: Payer: Self-pay

## 2019-07-08 ENCOUNTER — Emergency Department (HOSPITAL_COMMUNITY)
Admission: EM | Admit: 2019-07-08 | Discharge: 2019-07-08 | Disposition: A | Discharge status: Home / Self Care | Payer: Medicare Other | Source: Home / Self Care | Attending: Emergency Medicine | Admitting: Emergency Medicine

## 2019-07-08 ENCOUNTER — Encounter (HOSPITAL_COMMUNITY): Payer: Self-pay | Admitting: Emergency Medicine

## 2019-07-08 ENCOUNTER — Emergency Department (HOSPITAL_COMMUNITY): Payer: Medicare Other

## 2019-07-08 DIAGNOSIS — C159 Malignant neoplasm of esophagus, unspecified: Secondary | ICD-10-CM

## 2019-07-08 DIAGNOSIS — K117 Disturbances of salivary secretion: Secondary | ICD-10-CM

## 2019-07-08 DIAGNOSIS — Z79899 Other long term (current) drug therapy: Secondary | ICD-10-CM | POA: Insufficient documentation

## 2019-07-08 DIAGNOSIS — R0981 Nasal congestion: Secondary | ICD-10-CM | POA: Insufficient documentation

## 2019-07-08 DIAGNOSIS — J449 Chronic obstructive pulmonary disease, unspecified: Secondary | ICD-10-CM | POA: Insufficient documentation

## 2019-07-08 DIAGNOSIS — R05 Cough: Secondary | ICD-10-CM | POA: Diagnosis not present

## 2019-07-08 DIAGNOSIS — E1165 Type 2 diabetes mellitus with hyperglycemia: Secondary | ICD-10-CM | POA: Diagnosis not present

## 2019-07-08 DIAGNOSIS — R531 Weakness: Secondary | ICD-10-CM | POA: Diagnosis not present

## 2019-07-08 DIAGNOSIS — Z7984 Long term (current) use of oral hypoglycemic drugs: Secondary | ICD-10-CM | POA: Insufficient documentation

## 2019-07-08 DIAGNOSIS — R069 Unspecified abnormalities of breathing: Secondary | ICD-10-CM | POA: Diagnosis not present

## 2019-07-08 DIAGNOSIS — E119 Type 2 diabetes mellitus without complications: Secondary | ICD-10-CM | POA: Insufficient documentation

## 2019-07-08 DIAGNOSIS — Z87891 Personal history of nicotine dependence: Secondary | ICD-10-CM | POA: Insufficient documentation

## 2019-07-08 DIAGNOSIS — I10 Essential (primary) hypertension: Secondary | ICD-10-CM | POA: Insufficient documentation

## 2019-07-08 DIAGNOSIS — R0602 Shortness of breath: Secondary | ICD-10-CM | POA: Insufficient documentation

## 2019-07-08 DIAGNOSIS — I959 Hypotension, unspecified: Secondary | ICD-10-CM | POA: Diagnosis not present

## 2019-07-08 DIAGNOSIS — R000 Tachycardia, unspecified: Secondary | ICD-10-CM | POA: Diagnosis not present

## 2019-07-08 LAB — CBC WITH DIFFERENTIAL/PLATELET
Abs Immature Granulocytes: 0.02 10*3/uL (ref 0.00–0.07)
Basophils Absolute: 0 10*3/uL (ref 0.0–0.1)
Basophils Relative: 0 %
Eosinophils Absolute: 0 10*3/uL (ref 0.0–0.5)
Eosinophils Relative: 0 %
HCT: 25.5 % — ABNORMAL LOW (ref 39.0–52.0)
Hemoglobin: 8.6 g/dL — ABNORMAL LOW (ref 13.0–17.0)
Immature Granulocytes: 1 %
Lymphocytes Relative: 3 %
Lymphs Abs: 0.1 10*3/uL — ABNORMAL LOW (ref 0.7–4.0)
MCH: 30 pg (ref 26.0–34.0)
MCHC: 33.7 g/dL (ref 30.0–36.0)
MCV: 88.9 fL (ref 80.0–100.0)
Monocytes Absolute: 0.2 10*3/uL (ref 0.1–1.0)
Monocytes Relative: 6 %
Neutro Abs: 3.3 10*3/uL (ref 1.7–7.7)
Neutrophils Relative %: 90 %
Platelets: 120 10*3/uL — ABNORMAL LOW (ref 150–400)
RBC: 2.87 MIL/uL — ABNORMAL LOW (ref 4.22–5.81)
RDW: 13.8 % (ref 11.5–15.5)
WBC: 3.7 10*3/uL — ABNORMAL LOW (ref 4.0–10.5)
nRBC: 0 % (ref 0.0–0.2)

## 2019-07-08 LAB — COMPREHENSIVE METABOLIC PANEL
ALT: 23 U/L (ref 0–44)
AST: 20 U/L (ref 15–41)
Albumin: 2.5 g/dL — ABNORMAL LOW (ref 3.5–5.0)
Alkaline Phosphatase: 85 U/L (ref 38–126)
Anion gap: 14 (ref 5–15)
BUN: 13 mg/dL (ref 8–23)
CO2: 25 mmol/L (ref 22–32)
Calcium: 7.6 mg/dL — ABNORMAL LOW (ref 8.9–10.3)
Chloride: 90 mmol/L — ABNORMAL LOW (ref 98–111)
Creatinine, Ser: 0.52 mg/dL — ABNORMAL LOW (ref 0.61–1.24)
GFR calc Af Amer: >60 mL/min (ref >60)
GFR calc non Af Amer: >60 mL/min (ref >60)
Glucose, Bld: 196 mg/dL — ABNORMAL HIGH (ref 70–99)
Potassium: 4.3 mmol/L (ref 3.5–5.1)
Sodium: 129 mmol/L — ABNORMAL LOW (ref 135–145)
Total Bilirubin: 0.4 mg/dL (ref 0.3–1.2)
Total Protein: 5.8 g/dL — ABNORMAL LOW (ref 6.5–8.1)

## 2019-07-08 LAB — BRAIN NATRIURETIC PEPTIDE: B Natriuretic Peptide: 101 pg/mL — ABNORMAL HIGH (ref 0.0–100.0)

## 2019-07-08 LAB — TROPONIN I (HIGH SENSITIVITY): Troponin I (High Sensitivity): 7 ng/L (ref <18)

## 2019-07-08 MED ORDER — HEPARIN SOD (PORK) LOCK FLUSH 100 UNIT/ML IV SOLN
500.0000 [IU] | Freq: Once | INTRAVENOUS | Status: AC
  Administered 2019-07-08: 500 [IU]

## 2019-07-08 NOTE — ED Provider Notes (Signed)
[facility] EMERGENCY DEPARTMENT Provider Note   CSN: [id number] Arrival date & time: 07/08/19  [id number]     History Chief Complaint  Patient presents with  . Weakness    Kurt Duran is a 74 y.o. male.  Patient is a 74 year old male with past medical history of esophageal carcinoma, diabetes, COPD, hypertension.  Patient currently undergoing chemotherapy and radiation with Dr. Delton Coombes.  Patient presents today with complaints of congestion and coughing up of a frothy, white sputum.  This has been ongoing for several weeks, however is becoming worse recently.  This evening he felt short of breath and reports fever earlier this evening.  He denies any chest pain.  He does report feeling short of breath.  He also describes generalized weakness.  The history is provided by the patient.  Weakness Severity:  Moderate Onset quality:  Gradual Duration:  3 days Timing:  Constant Progression:  Worsening Chronicity:  New Associated symptoms: cough and fever        Past Medical History:  Diagnosis Date  . Anxiety   . Arthritis   . COPD (chronic obstructive pulmonary disease) ([place])   . Diabetes mellitus without complication ([place])   . Diabetic retinopathy ([place])    NPDR OU  . Esophageal cancer ([place])   . GERD (gastroesophageal reflux disease)   . Gout   . Hypertension   . Hypertensive retinopathy    OU  . Port-A-Cath in place 06/07/2019    Patient Active Problem List   Diagnosis Date Noted  . Dehydration 07/06/2019  . Port-A-Cath in place 06/07/2019  . Protein-calorie malnutrition, severe 05/13/2019  . 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 Esophageal mass-Lower 3rd   . COPD (chronic obstructive pulmonary disease) ([place]) 05/09/2019  . DM2 (diabetes mellitus, type 2) ([place]) 05/09/2019  . GERD (gastroesophageal reflux disease) 05/09/2019  . Intractable vomiting 05/09/2019  . HTN (hypertension) 05/09/2019  . Gout 05/09/2019  . Intractable nausea and vomiting  05/09/2019    Past Surgical History:  Procedure Laterality Date  . BIOPSY  05/10/2019   Procedure: BIOPSY;  Surgeon: Danie Binder, MD;  Location: [facility] ENDO SUITE;  Service: Endoscopy;;  gastric esophageal  . CATARACT EXTRACTION W/PHACO Left 04/03/2016   Procedure: CATARACT EXTRACTION PHACO AND INTRAOCULAR LENS PLACEMENT LEFT EYE CDE= 11.33;  Surgeon: Tonny Branch, MD;  Location: [facility] ORS;  Service: Ophthalmology;  Laterality: Left;  left  . CATARACT EXTRACTION W/PHACO Right 04/28/2016   Procedure: CATARACT EXTRACTION PHACO AND INTRAOCULAR LENS PLACEMENT (IOC);  Surgeon: Tonny Branch, MD;  Location: [facility] ORS;  Service: Ophthalmology;  Laterality: Right;  [id number]   . ESOPHAGOGASTRODUODENOSCOPY (EGD) WITH PROPOFOL N/A 05/10/2019   Procedure: ESOPHAGOGASTRODUODENOSCOPY (EGD) WITH PROPOFOL with dilation as appropriate.;  Surgeon: Danie Binder, MD;  Location: [facility] ENDO SUITE;  Service: Endoscopy;  Laterality: N/A;  . EYE SURGERY Bilateral    Cat Sx OU  . JEJUNOSTOMY N/A 05/13/2019   Procedure: JEJUNOSTOMY FEEDING TUBE PLACEMENT (Procedure #2);  Surgeon: Aviva Signs, MD;  Location: [facility] ORS;  Service: General;  Laterality: N/A;  . KNEE ARTHROSCOPY Right   . NECK SURGERY    . PORTACATH PLACEMENT Right 05/13/2019   Procedure: INSERTION PORT-A-CATH (attached catheter in right subclavian) (Procedure #1);  Surgeon: Aviva Signs, MD;  Location: [facility] ORS;  Service: General;  Laterality: Right;  . ROTATOR CUFF REPAIR Right        Family History  Adopted: Yes  Problem Relation Age of Onset  . Hypertension Brother   . Kidney  disease Brother     Social History   Tobacco Use  . Smoking status: Former Smoker    Types: Cigarettes    Quit date: 03/26/1989    Years since quitting: 30.3  . Smokeless tobacco: Never Used  Substance Use Topics  . Alcohol use: No  . Drug use: No    Home Medications Prior to Admission medications   Medication Sig Start Date End Date Taking? Authorizing Provider    acetaminophen (TYLENOL) 325 MG tablet Place 2 tablets (650 mg total) into feeding tube every 6 (six) hours as needed for mild pain (or Fever >/= 101). Patient not taking: Reported on 06/07/2019 05/16/19   Elgergawy, Silver Huguenin, MD  albuterol (PROVENTIL HFA;VENTOLIN HFA) 108 (90 Base) MCG/ACT inhaler Inhale 2 puffs into the lungs every 4 (four) hours as needed for wheezing or shortness of breath.    [provider]  albuterol (PROVENTIL) (2.5 MG/3ML) 0.083% nebulizer solution Take 2.5 mg by nebulization every 6 (six) hours as needed for wheezing or shortness of breath.    [provider]  ALPRAZolam Duanne Moron) 0.5 MG tablet 1 tablet (0.5 mg total) by Per J Tube route 2 (two) times daily. 05/16/19   Elgergawy, Silver Huguenin, MD  Amino Acids-Protein Hydrolys (FEEDING SUPPLEMENT, PRO-STAT SUGAR FREE 64,) LIQD 30 mLs by Per J Tube route 2 (two) times daily. 05/16/19   Elgergawy, Silver Huguenin, MD  atorvastatin (LIPITOR) 40 MG tablet 1 tablet (40 mg total) by Per J Tube route every evening. for cholesterol Patient not taking: Reported on 06/29/2019 05/16/19   Elgergawy, Silver Huguenin, MD  CARBOPLATIN IV Inject into the vein once a week. 06/15/19   [provider]  famotidine (PEPCID) 20 MG tablet Place 1 tablet (20 mg total) into feeding tube daily. 05/16/19   Elgergawy, Silver Huguenin, MD  FLUoxetine (PROZAC) 20 MG capsule Place 1 capsule (20 mg total) into feeding tube daily. 05/16/19   Elgergawy, Silver Huguenin, MD  glipiZIDE (GLUCOTROL) 10 MG tablet Place 1 tablet (10 mg total) into feeding tube 2 (two) times daily before a meal. 05/16/19   Elgergawy, Silver Huguenin, MD  lidocaine-prilocaine (EMLA) cream Apply a pea-sized amount to port a cath site and cover with plastic wrap one hour prior to chemotherapy appointments Patient not taking: Reported on 06/22/2019 06/07/19   Derek Jack, MD  metFORMIN (GLUCOPHAGE) 1000 MG tablet 1 tablet (1,000 mg total) by Per J Tube route 2 (two) times daily. 05/16/19   Elgergawy, Silver Huguenin, MD   Nutritional Supplements (FEEDING SUPPLEMENT, OSMOLITE 1.5 CAL,) LIQD Place 1,000 mLs into feeding tube continuous. 05/16/19   Elgergawy, Silver Huguenin, MD  ondansetron (ZOFRAN-ODT) 4 MG disintegrating tablet Take 4 mg by mouth every 6 (six) hours as needed. 06/03/19   [provider]  PACLITAXEL IV Inject 50 mg/m2 into the vein once a week.  06/15/19   [provider]  pantoprazole sodium (PROTONIX) 40 mg/20 mL PACK Place 20 mLs (40 mg total) into feeding tube daily. 05/16/19   Elgergawy, Silver Huguenin, MD  prochlorperazine (COMPAZINE) 10 MG tablet Take 1 tablet (10 mg total) by mouth every 6 (six) hours as needed (Nausea or vomiting). Patient not taking: Reported on 06/22/2019 06/07/19   Derek Jack, MD  promethazine (PHENERGAN) 25 MG suppository Place 25 mg rectally every 6 (six) hours as needed. 06/03/19   [provider]  scopolamine (TRANSDERM-SCOP) 1 MG/3DAYS Place 1 patch (1.5 mg total) onto the skin every 3 (three) days. Patient not taking: Reported on 07/06/2019  06/29/19   Derek Jack, MD  terazosin (HYTRIN) 10 MG capsule 1 capsule (10 mg total) by Per J Tube route daily. 05/16/19   Elgergawy, Silver Huguenin, MD  Water For Irrigation, Sterile (FREE WATER) SOLN Place 300 mLs into feeding tube every 8 (eight) hours. 05/16/19   Elgergawy, Silver Huguenin, MD    Allergies    Eggs or egg-derived products  Review of Systems   Review of Systems  Constitutional: Positive for fatigue and fever.  Respiratory: Positive for cough.   Neurological: Positive for weakness.  All other systems reviewed and are negative.   Physical Exam Updated Vital Signs BP 111/74   Pulse (!) 106   Temp 99.3 F (37.4 C) (Oral)   Resp (!) 28   Ht 6\' 1"  (1.854 m)   Wt 89.4 kg   SpO2 97%   BMI 25.99 kg/m   Physical Exam Vitals and nursing note reviewed.  Constitutional:      General: He is not in acute distress.    Appearance: He is well-developed. He is not diaphoretic.  HENT:     Head:  Normocephalic and atraumatic.  Cardiovascular:     Rate and Rhythm: Normal rate and regular rhythm.     Heart sounds: No murmur. No friction rub.  Pulmonary:     Effort: Pulmonary effort is normal. No respiratory distress.     Breath sounds: Normal breath sounds. No wheezing or rales.  Abdominal:     General: Bowel sounds are normal. There is no distension.     Palpations: Abdomen is soft.     Tenderness: There is no abdominal tenderness.  Musculoskeletal:        General: Normal range of motion.     Cervical back: Normal range of motion and neck supple.     Right lower leg: Edema present.     Left lower leg: Edema present.     Comments: There is 1-2+ pitting edema both lower extremities.  There is no calf tenderness.  Bevelyn Buckles' sign absent bilaterally.  Skin:    General: Skin is warm and dry.  Neurological:     Mental Status: He is alert and oriented to person, place, and time.     Coordination: Coordination normal.     ED Results / Procedures / Treatments   Labs (all labs ordered are listed, but only abnormal results are displayed) Labs Reviewed  COMPREHENSIVE METABOLIC PANEL  CBC WITH DIFFERENTIAL/PLATELET  BRAIN NATRIURETIC PEPTIDE  TROPONIN I (HIGH SENSITIVITY)    EKG EKG Interpretation  Date/Time:  Friday July 08 2019 03:14:54 EDT Ventricular Rate:  103 PR Interval:    QRS Duration: 84 QT Interval:  346 QTC Calculation: 453 R Axis:   16 Text Interpretation: Sinus tachycardia Low voltage, extremity leads Nonspecific repol abnormality, inferior leads Baseline wander in lead(s) I II III aVL aVF V3 V5 V6 No significant change since 05/09/2019 Confirmed by Veryl Speak [id number]) on 07/08/2019 3:22:14 AM   Radiology No results found.  Procedures Procedures (including critical care time)  Medications Ordered in ED Medications - No data to display  ED Course  I have reviewed the triage vital signs and the nursing notes.  Pertinent labs & imaging results that were  available during my care of the patient were reviewed by me and considered in my medical decision making (see chart for details).    MDM Rules/Calculators/A&P  Patient is a 74 year old male with history of esophageal adenocarcinoma presenting with complaints of weakness and coughing up frothy  phlegm.  Patient unable to swallow and gets feeds through the G-tube.  Patient's work-up today is essentially unremarkable.  He has no fever, no white count, and vitals are stable.  He is otherwise well-appearing.  Chest x-ray is clear.  Seems as though his main issue is pooling of saliva in his esophagus that he occasionally coughs up.  He had been prescribed scopolamine patches in the past, however his wife feels as though this dried him up too much.  I see no indication for emergent treatment or admission.  There is nothing today that appears infectious.  I feel as though he is appropriate for discharge with close follow-up with Dr. Delton Coombes.  Final Clinical Impression(s) / ED Diagnoses Final diagnoses:  None    Rx / DC Orders ED Discharge Orders    None       Veryl Speak, MD 07/08/19 [phone]

## 2019-07-08 NOTE — Discharge Instructions (Addendum)
Call Dr. Tomie China office tomorrow to arrange a follow-up appointment.  Return to the emergency department in the meantime for high fevers, difficulty breathing, or other new and concerning symptoms.

## 2019-07-08 NOTE — ED Triage Notes (Signed)
Pt here c/o weakness, fever, congestion. Pt currently being treated for throat CA. Pts last treatment was yesterday.

## 2019-07-09 ENCOUNTER — Emergency Department (HOSPITAL_COMMUNITY): Payer: Medicare Other

## 2019-07-09 ENCOUNTER — Encounter (HOSPITAL_COMMUNITY): Payer: Self-pay | Admitting: *Deleted

## 2019-07-09 ENCOUNTER — Other Ambulatory Visit: Payer: Self-pay

## 2019-07-09 ENCOUNTER — Inpatient Hospital Stay (HOSPITAL_COMMUNITY)
Admission: EM | Admit: 2019-07-09 | Discharge: 2019-07-16 | DRG: 871 | Disposition: A | Discharge status: Skilled Nursing Facility | Payer: Medicare Other | Attending: Internal Medicine | Admitting: Internal Medicine

## 2019-07-09 DIAGNOSIS — Z792 Long term (current) use of antibiotics: Secondary | ICD-10-CM | POA: Diagnosis not present

## 2019-07-09 DIAGNOSIS — K219 Gastro-esophageal reflux disease without esophagitis: Secondary | ICD-10-CM | POA: Diagnosis present

## 2019-07-09 DIAGNOSIS — Z931 Gastrostomy status: Secondary | ICD-10-CM

## 2019-07-09 DIAGNOSIS — E119 Type 2 diabetes mellitus without complications: Secondary | ICD-10-CM

## 2019-07-09 DIAGNOSIS — Z7984 Long term (current) use of oral hypoglycemic drugs: Secondary | ICD-10-CM | POA: Diagnosis not present

## 2019-07-09 DIAGNOSIS — K2081 Other esophagitis with bleeding: Secondary | ICD-10-CM | POA: Diagnosis present

## 2019-07-09 DIAGNOSIS — E113293 Type 2 diabetes mellitus with mild nonproliferative diabetic retinopathy without macular edema, bilateral: Secondary | ICD-10-CM | POA: Diagnosis present

## 2019-07-09 DIAGNOSIS — R0689 Other abnormalities of breathing: Secondary | ICD-10-CM | POA: Diagnosis not present

## 2019-07-09 DIAGNOSIS — R6521 Severe sepsis with septic shock: Secondary | ICD-10-CM | POA: Diagnosis present

## 2019-07-09 DIAGNOSIS — D61810 Antineoplastic chemotherapy induced pancytopenia: Secondary | ICD-10-CM | POA: Diagnosis present

## 2019-07-09 DIAGNOSIS — I10 Essential (primary) hypertension: Secondary | ICD-10-CM | POA: Diagnosis present

## 2019-07-09 DIAGNOSIS — A419 Sepsis, unspecified organism: Secondary | ICD-10-CM | POA: Diagnosis present

## 2019-07-09 DIAGNOSIS — T451X5A Adverse effect of antineoplastic and immunosuppressive drugs, initial encounter: Secondary | ICD-10-CM | POA: Diagnosis present

## 2019-07-09 DIAGNOSIS — I499 Cardiac arrhythmia, unspecified: Secondary | ICD-10-CM | POA: Diagnosis not present

## 2019-07-09 DIAGNOSIS — Z95828 Presence of other vascular implants and grafts: Secondary | ICD-10-CM | POA: Diagnosis not present

## 2019-07-09 DIAGNOSIS — R7881 Bacteremia: Secondary | ICD-10-CM

## 2019-07-09 DIAGNOSIS — A408 Other streptococcal sepsis: Secondary | ICD-10-CM | POA: Diagnosis present

## 2019-07-09 DIAGNOSIS — Z79899 Other long term (current) drug therapy: Secondary | ICD-10-CM

## 2019-07-09 DIAGNOSIS — J9811 Atelectasis: Secondary | ICD-10-CM | POA: Diagnosis not present

## 2019-07-09 DIAGNOSIS — Z87891 Personal history of nicotine dependence: Secondary | ICD-10-CM

## 2019-07-09 DIAGNOSIS — Z20822 Contact with and (suspected) exposure to covid-19: Secondary | ICD-10-CM | POA: Diagnosis present

## 2019-07-09 DIAGNOSIS — Z934 Other artificial openings of gastrointestinal tract status: Secondary | ICD-10-CM | POA: Diagnosis not present

## 2019-07-09 DIAGNOSIS — K222 Esophageal obstruction: Secondary | ICD-10-CM | POA: Diagnosis present

## 2019-07-09 DIAGNOSIS — C159 Malignant neoplasm of esophagus, unspecified: Secondary | ICD-10-CM | POA: Diagnosis present

## 2019-07-09 DIAGNOSIS — E869 Volume depletion, unspecified: Secondary | ICD-10-CM | POA: Diagnosis present

## 2019-07-09 DIAGNOSIS — R54 Age-related physical debility: Secondary | ICD-10-CM | POA: Diagnosis present

## 2019-07-09 DIAGNOSIS — H9190 Unspecified hearing loss, unspecified ear: Secondary | ICD-10-CM | POA: Diagnosis present

## 2019-07-09 DIAGNOSIS — R5081 Fever presenting with conditions classified elsewhere: Secondary | ICD-10-CM | POA: Diagnosis not present

## 2019-07-09 DIAGNOSIS — E871 Hypo-osmolality and hyponatremia: Secondary | ICD-10-CM | POA: Diagnosis not present

## 2019-07-09 DIAGNOSIS — I959 Hypotension, unspecified: Secondary | ICD-10-CM | POA: Diagnosis not present

## 2019-07-09 DIAGNOSIS — R109 Unspecified abdominal pain: Secondary | ICD-10-CM

## 2019-07-09 DIAGNOSIS — D709 Neutropenia, unspecified: Secondary | ICD-10-CM

## 2019-07-09 DIAGNOSIS — K920 Hematemesis: Secondary | ICD-10-CM | POA: Diagnosis not present

## 2019-07-09 DIAGNOSIS — R2689 Other abnormalities of gait and mobility: Secondary | ICD-10-CM | POA: Diagnosis not present

## 2019-07-09 DIAGNOSIS — Z8249 Family history of ischemic heart disease and other diseases of the circulatory system: Secondary | ICD-10-CM

## 2019-07-09 DIAGNOSIS — K8020 Calculus of gallbladder without cholecystitis without obstruction: Secondary | ICD-10-CM | POA: Diagnosis not present

## 2019-07-09 DIAGNOSIS — R509 Fever, unspecified: Secondary | ICD-10-CM | POA: Diagnosis not present

## 2019-07-09 DIAGNOSIS — D61818 Other pancytopenia: Secondary | ICD-10-CM | POA: Diagnosis present

## 2019-07-09 DIAGNOSIS — E222 Syndrome of inappropriate secretion of antidiuretic hormone: Secondary | ICD-10-CM | POA: Diagnosis present

## 2019-07-09 DIAGNOSIS — C155 Malignant neoplasm of lower third of esophagus: Secondary | ICD-10-CM | POA: Diagnosis present

## 2019-07-09 DIAGNOSIS — Z7401 Bed confinement status: Secondary | ICD-10-CM

## 2019-07-09 DIAGNOSIS — R0602 Shortness of breath: Secondary | ICD-10-CM | POA: Diagnosis not present

## 2019-07-09 DIAGNOSIS — E1165 Type 2 diabetes mellitus with hyperglycemia: Secondary | ICD-10-CM | POA: Diagnosis not present

## 2019-07-09 DIAGNOSIS — J449 Chronic obstructive pulmonary disease, unspecified: Secondary | ICD-10-CM | POA: Diagnosis present

## 2019-07-09 DIAGNOSIS — Z66 Do not resuscitate: Secondary | ICD-10-CM | POA: Diagnosis present

## 2019-07-09 DIAGNOSIS — F419 Anxiety disorder, unspecified: Secondary | ICD-10-CM | POA: Diagnosis present

## 2019-07-09 DIAGNOSIS — Z841 Family history of disorders of kidney and ureter: Secondary | ICD-10-CM | POA: Diagnosis not present

## 2019-07-09 DIAGNOSIS — R000 Tachycardia, unspecified: Secondary | ICD-10-CM | POA: Diagnosis not present

## 2019-07-09 DIAGNOSIS — Y842 Radiological procedure and radiotherapy as the cause of abnormal reaction of the patient, or of later complication, without mention of misadventure at the time of the procedure: Secondary | ICD-10-CM | POA: Diagnosis present

## 2019-07-09 DIAGNOSIS — Z9221 Personal history of antineoplastic chemotherapy: Secondary | ICD-10-CM

## 2019-07-09 DIAGNOSIS — M6281 Muscle weakness (generalized): Secondary | ICD-10-CM | POA: Diagnosis not present

## 2019-07-09 DIAGNOSIS — Z9889 Other specified postprocedural states: Secondary | ICD-10-CM

## 2019-07-09 DIAGNOSIS — R41841 Cognitive communication deficit: Secondary | ICD-10-CM | POA: Diagnosis not present

## 2019-07-09 LAB — COMPREHENSIVE METABOLIC PANEL
ALT: 24 U/L (ref 0–44)
AST: 28 U/L (ref 15–41)
Albumin: 2.4 g/dL — ABNORMAL LOW (ref 3.5–5.0)
Alkaline Phosphatase: 111 U/L (ref 38–126)
Anion gap: 13 (ref 5–15)
BUN: 14 mg/dL (ref 8–23)
CO2: 23 mmol/L (ref 22–32)
Calcium: 7.8 mg/dL — ABNORMAL LOW (ref 8.9–10.3)
Chloride: 90 mmol/L — ABNORMAL LOW (ref 98–111)
Creatinine, Ser: 0.61 mg/dL (ref 0.61–1.24)
GFR calc Af Amer: >60 mL/min (ref >60)
GFR calc non Af Amer: >60 mL/min (ref >60)
Glucose, Bld: 267 mg/dL — ABNORMAL HIGH (ref 70–99)
Potassium: 3.7 mmol/L (ref 3.5–5.1)
Sodium: 126 mmol/L — ABNORMAL LOW (ref 135–145)
Total Bilirubin: 0.6 mg/dL (ref 0.3–1.2)
Total Protein: 5.6 g/dL — ABNORMAL LOW (ref 6.5–8.1)

## 2019-07-09 LAB — URINALYSIS, ROUTINE W REFLEX MICROSCOPIC
Bilirubin Urine: NEGATIVE
Glucose, UA: >=500 mg/dL — AB
Hgb urine dipstick: NEGATIVE
Ketones, ur: NEGATIVE mg/dL
Leukocytes,Ua: NEGATIVE
Nitrite: NEGATIVE
Protein, ur: NEGATIVE mg/dL
Specific Gravity, Urine: 1.018 (ref 1.005–1.030)
pH: 5 (ref 5.0–8.0)

## 2019-07-09 LAB — CBC WITH DIFFERENTIAL/PLATELET
Abs Immature Granulocytes: 0 10*3/uL (ref 0.00–0.07)
Basophils Absolute: 0 10*3/uL (ref 0.0–0.1)
Basophils Relative: 0 %
Eosinophils Absolute: 0 10*3/uL (ref 0.0–0.5)
Eosinophils Relative: 0 %
HCT: 25.4 % — ABNORMAL LOW (ref 39.0–52.0)
Hemoglobin: 8.6 g/dL — ABNORMAL LOW (ref 13.0–17.0)
Immature Granulocytes: 0 %
Lymphocytes Relative: 1 %
Lymphs Abs: 0 10*3/uL — ABNORMAL LOW (ref 0.7–4.0)
MCH: 30.3 pg (ref 26.0–34.0)
MCHC: 33.9 g/dL (ref 30.0–36.0)
MCV: 89.4 fL (ref 80.0–100.0)
Monocytes Absolute: 0 10*3/uL — ABNORMAL LOW (ref 0.1–1.0)
Monocytes Relative: 1 %
Neutro Abs: 0.7 10*3/uL — ABNORMAL LOW (ref 1.7–7.7)
Neutrophils Relative %: 98 %
Platelets: 126 10*3/uL — ABNORMAL LOW (ref 150–400)
RBC: 2.84 MIL/uL — ABNORMAL LOW (ref 4.22–5.81)
RDW: 13.7 % (ref 11.5–15.5)
WBC: 0.8 10*3/uL — CL (ref 4.0–10.5)
nRBC: 0 % (ref 0.0–0.2)

## 2019-07-09 LAB — LACTIC ACID, PLASMA
Lactic Acid, Venous: 4.6 mmol/L (ref 0.5–1.9)
Lactic Acid, Venous: 5 mmol/L (ref 0.5–1.9)
Lactic Acid, Venous: 3.3 mmol/L (ref 0.5–1.9)
Lactic Acid, Venous: 2.7 mmol/L (ref 0.5–1.9)

## 2019-07-09 LAB — PROTIME-INR
INR: 1.2 (ref 0.8–1.2)
Prothrombin Time: 15.1 seconds (ref 11.4–15.2)

## 2019-07-09 LAB — GLUCOSE, CAPILLARY
Glucose-Capillary: 215 mg/dL — ABNORMAL HIGH (ref 70–99)
Glucose-Capillary: 117 mg/dL — ABNORMAL HIGH (ref 70–99)

## 2019-07-09 LAB — SARS CORONAVIRUS 2 (TAT 6-24 HRS): SARS Coronavirus 2: NEGATIVE

## 2019-07-09 LAB — MRSA PCR SCREENING: MRSA by PCR: NEGATIVE

## 2019-07-09 LAB — RESPIRATORY PANEL BY RT PCR (FLU A&B, COVID)
Influenza A by PCR: NEGATIVE
Influenza B by PCR: NEGATIVE
SARS Coronavirus 2 by RT PCR: NEGATIVE

## 2019-07-09 LAB — MAGNESIUM: Magnesium: 1.6 mg/dL — ABNORMAL LOW (ref 1.7–2.4)

## 2019-07-09 LAB — APTT: aPTT: 63 seconds — ABNORMAL HIGH (ref 24–36)

## 2019-07-09 MED ORDER — NOREPINEPHRINE 4 MG/250ML-% IV SOLN
0.0000 ug/min | INTRAVENOUS | Status: DC
  Administered 2019-07-09: 12:00:00 2 ug/min via INTRAVENOUS

## 2019-07-09 MED ORDER — OXYCODONE HCL 5 MG/5ML PO SOLN
5.0000 mg | Freq: Once | ORAL | Status: AC
  Administered 2019-07-09: 22:00:00 5 mg
  Filled 2019-07-09: qty 5

## 2019-07-09 MED ORDER — ONDANSETRON HCL 4 MG/2ML IJ SOLN
4.0000 mg | Freq: Four times a day (QID) | INTRAMUSCULAR | Status: DC | PRN
  Administered 2019-07-10: 4 mg via INTRAVENOUS
  Filled 2019-07-09: qty 2

## 2019-07-09 MED ORDER — SODIUM CHLORIDE 0.9 % IV SOLN
1000.0000 mL | INTRAVENOUS | Status: DC
  Administered 2019-07-10 – 2019-07-14 (×4): 1000 mL via INTRAVENOUS

## 2019-07-09 MED ORDER — CEFTRIAXONE SODIUM IN DEXTROSE 40 MG/ML IV SOLN
2.0000 g | Freq: Once | INTRAVENOUS | Status: DC
  Filled 2019-07-09: qty 50

## 2019-07-09 MED ORDER — SODIUM CHLORIDE 0.9 % IV SOLN
1000.0000 mL | INTRAVENOUS | Status: DC
  Administered 2019-07-09: 09:00:00 1000 mL via INTRAVENOUS

## 2019-07-09 MED ORDER — ONDANSETRON HCL 4 MG PO TABS: 4.0000 mg | ORAL_TABLET | Freq: Four times a day (QID) | ORAL | Status: DC | PRN

## 2019-07-09 MED ORDER — FREE WATER
300.0000 mL | Freq: Three times a day (TID) | Status: DC
  Administered 2019-07-09 – 2019-07-13 (×6): 300 mL

## 2019-07-09 MED ORDER — HEPARIN SODIUM (PORCINE) 5000 UNIT/ML IJ SOLN
5000.0000 [IU] | Freq: Three times a day (TID) | INTRAMUSCULAR | Status: DC
  Administered 2019-07-09 – 2019-07-10 (×3): 5000 [IU] via SUBCUTANEOUS
  Filled 2019-07-09 (×3): qty 1

## 2019-07-09 MED ORDER — ACETAMINOPHEN 325 MG PO TABS
650.0000 mg | ORAL_TABLET | Freq: Four times a day (QID) | ORAL | Status: DC | PRN
  Administered 2019-07-10 – 2019-07-15 (×2): 650 mg via ORAL
  Filled 2019-07-09 (×2): qty 2

## 2019-07-09 MED ORDER — ALBUTEROL SULFATE (2.5 MG/3ML) 0.083% IN NEBU: 2.5000 mg | INHALATION_SOLUTION | Freq: Four times a day (QID) | RESPIRATORY_TRACT | Status: DC | PRN

## 2019-07-09 MED ORDER — LACTATED RINGERS IV BOLUS (SEPSIS)
1000.0000 mL | Freq: Once | INTRAVENOUS | Status: AC
  Administered 2019-07-09: 1000 mL via INTRAVENOUS

## 2019-07-09 MED ORDER — ALPRAZOLAM 0.5 MG PO TABS
0.5000 mg | ORAL_TABLET | Freq: Two times a day (BID) | ORAL | Status: DC
  Administered 2019-07-09 – 2019-07-16 (×10): 0.5 mg via JEJUNOSTOMY
  Filled 2019-07-09 (×11): qty 1

## 2019-07-09 MED ORDER — PRO-STAT SUGAR FREE PO LIQD
30.0000 mL | Freq: Two times a day (BID) | ORAL | Status: DC
  Administered 2019-07-09 – 2019-07-16 (×10): 30 mL via JEJUNOSTOMY
  Filled 2019-07-09 (×11): qty 30

## 2019-07-09 MED ORDER — SODIUM CHLORIDE 0.9 % IV SOLN
2.0000 g | Freq: Three times a day (TID) | INTRAVENOUS | Status: DC
  Administered 2019-07-09 – 2019-07-11 (×6): 2 g via INTRAVENOUS
  Filled 2019-07-09 (×6): qty 2

## 2019-07-09 MED ORDER — SODIUM CHLORIDE 0.9 % IV SOLN
2.0000 g | Freq: Once | INTRAVENOUS | Status: AC
  Administered 2019-07-09: 11:00:00 2 g via INTRAVENOUS
  Filled 2019-07-09: qty 2

## 2019-07-09 MED ORDER — IOHEXOL 300 MG/ML  SOLN
100.0000 mL | Freq: Once | INTRAMUSCULAR | Status: AC | PRN
  Administered 2019-07-09: 100 mL via INTRAVENOUS

## 2019-07-09 MED ORDER — PANTOPRAZOLE SODIUM 40 MG PO PACK
40.0000 mg | PACK | Freq: Every day | ORAL | Status: DC
  Administered 2019-07-09 – 2019-07-10 (×2): 40 mg
  Filled 2019-07-09 (×2): qty 20

## 2019-07-09 MED ORDER — SODIUM CHLORIDE 0.9 % IV SOLN
2.0000 g | Freq: Once | INTRAVENOUS | Status: AC
  Administered 2019-07-09: 2 g via INTRAVENOUS
  Filled 2019-07-09: qty 20

## 2019-07-09 MED ORDER — OSMOLITE 1.5 CAL PO LIQD
1000.0000 mL | ORAL | Status: DC
  Administered 2019-07-09 – 2019-07-10 (×2): 1000 mL

## 2019-07-09 MED ORDER — FLUOXETINE HCL 20 MG PO CAPS
20.0000 mg | ORAL_CAPSULE | Freq: Every day | ORAL | Status: DC
  Administered 2019-07-09 – 2019-07-16 (×6): 20 mg
  Filled 2019-07-09 (×7): qty 1

## 2019-07-09 MED ORDER — SODIUM CHLORIDE 0.9 % IV BOLUS
1000.0000 mL | Freq: Once | INTRAVENOUS | Status: AC
  Administered 2019-07-09: 12:00:00 1000 mL via INTRAVENOUS

## 2019-07-09 MED ORDER — LACTATED RINGERS IV BOLUS (SEPSIS)
1000.0000 mL | Freq: Once | INTRAVENOUS | Status: AC
  Administered 2019-07-09: 10:00:00 1000 mL via INTRAVENOUS

## 2019-07-09 MED ORDER — INSULIN ASPART 100 UNIT/ML ~~LOC~~ SOLN
0.0000 [IU] | SUBCUTANEOUS | Status: DC
  Administered 2019-07-09: 5 [IU] via SUBCUTANEOUS
  Administered 2019-07-10: 8 [IU] via SUBCUTANEOUS
  Administered 2019-07-10: 5 [IU] via SUBCUTANEOUS
  Administered 2019-07-10 (×2): 3 [IU] via SUBCUTANEOUS
  Administered 2019-07-10: 01:00:00 2 [IU] via SUBCUTANEOUS
  Administered 2019-07-10 – 2019-07-11 (×2): 3 [IU] via SUBCUTANEOUS
  Administered 2019-07-11 (×4): 2 [IU] via SUBCUTANEOUS
  Administered 2019-07-11: 10:00:00 3 [IU] via SUBCUTANEOUS
  Administered 2019-07-12 (×2): 2 [IU] via SUBCUTANEOUS
  Administered 2019-07-12: 3 [IU] via SUBCUTANEOUS
  Administered 2019-07-12: 06:00:00 2 [IU] via SUBCUTANEOUS
  Administered 2019-07-12: 3 [IU] via SUBCUTANEOUS
  Administered 2019-07-12: 10:00:00 2 [IU] via SUBCUTANEOUS
  Administered 2019-07-13 (×2): 3 [IU] via SUBCUTANEOUS
  Administered 2019-07-13: 2 [IU] via SUBCUTANEOUS
  Administered 2019-07-13 – 2019-07-14 (×4): 3 [IU] via SUBCUTANEOUS
  Administered 2019-07-14 (×2): 5 [IU] via SUBCUTANEOUS
  Administered 2019-07-14: 3 [IU] via SUBCUTANEOUS
  Administered 2019-07-14 – 2019-07-15 (×3): 5 [IU] via SUBCUTANEOUS
  Administered 2019-07-15: 3 [IU] via SUBCUTANEOUS
  Administered 2019-07-15 (×3): 5 [IU] via SUBCUTANEOUS
  Administered 2019-07-16: 3 [IU] via SUBCUTANEOUS
  Administered 2019-07-16: 5 [IU] via SUBCUTANEOUS
  Administered 2019-07-16: 05:00:00 3 [IU] via SUBCUTANEOUS
  Administered 2019-07-16: 5 [IU] via SUBCUTANEOUS

## 2019-07-09 MED ORDER — IBUPROFEN 100 MG/5ML PO SUSP
600.0000 mg | Freq: Once | ORAL | Status: AC
  Administered 2019-07-09: 09:00:00 600 mg via ORAL
  Filled 2019-07-09: qty 30

## 2019-07-09 MED ORDER — ACETAMINOPHEN 650 MG RE SUPP
650.0000 mg | Freq: Four times a day (QID) | RECTAL | Status: DC | PRN
  Administered 2019-07-09: 650 mg via RECTAL
  Filled 2019-07-09: qty 1

## 2019-07-09 MED ORDER — FAMOTIDINE 20 MG PO TABS
20.0000 mg | ORAL_TABLET | Freq: Every day | ORAL | Status: DC
  Administered 2019-07-09 – 2019-07-16 (×6): 20 mg
  Filled 2019-07-09 (×7): qty 1

## 2019-07-09 NOTE — Progress Notes (Signed)
Notified bedside nurse of need to draw repeat lactic acid. 

## 2019-07-09 NOTE — ED Notes (Signed)
Call from lab  Second lactic 5  PA notified

## 2019-07-09 NOTE — ED Provider Notes (Signed)
[facility] EMERGENCY DEPARTMENT Provider Note   CSN: [id number] Arrival date & time: 07/09/19  [id number]     History Chief Complaint  Patient presents with  . Fever    Kurt Duran is a 74 y.o. male.  HPI HPI Comments: Kurt Duran is a 74 y.o. male who presents to the Emergency Department complaining of fever.  Per nursing staff, patient's wife noted that he had a fever at home and he was given 650 of APAP this morning through his G-tube.  Temperature checked rectally upon arrival showing 104.2 Fahrenheit.  Patient is hard of hearing but is alert and oriented and denies any regions of pain as well as nausea, vomiting, diarrhea.  He was seen yesterday in the emergency department and worked up for "weakness" but was ultimately discharged due to stable vital signs and reassuring lab work.  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      Past Medical History:  Diagnosis Date  . Anxiety   . Arthritis   . COPD (chronic obstructive pulmonary disease) ([place])   . Diabetes mellitus without complication ([place])   . Diabetic retinopathy ([place])    NPDR OU  . Esophageal cancer ([place])   . GERD (gastroesophageal reflux disease)   . Gout   . Hypertension   . Hypertensive retinopathy    OU  . Port-A-Cath in place 06/07/2019    Patient Active Problem List   Diagnosis Date Noted  . Dehydration 07/06/2019  . Port-A-Cath in place 06/07/2019  . Protein-calorie malnutrition, severe 05/13/2019  . 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 Esophageal mass-Lower 3rd   . COPD (chronic obstructive pulmonary disease) ([place]) 05/09/2019  . DM2 (diabetes mellitus, type 2) ([place]) 05/09/2019  . GERD (gastroesophageal reflux disease) 05/09/2019  . Intractable vomiting 05/09/2019  . HTN (hypertension) 05/09/2019  . Gout 05/09/2019   . Intractable nausea and vomiting 05/09/2019    Past Surgical History:  Procedure Laterality Date  . BIOPSY  05/10/2019   Procedure: BIOPSY;  Surgeon: Danie Binder, MD;  Location: [facility] ENDO SUITE;  Service: Endoscopy;;  gastric esophageal  . CATARACT EXTRACTION W/PHACO Left 04/03/2016   Procedure: CATARACT EXTRACTION PHACO AND INTRAOCULAR LENS PLACEMENT LEFT EYE CDE= 11.33;  Surgeon: Tonny Branch, MD;  Location: [facility] ORS;  Service: Ophthalmology;  Laterality: Left;  left  . CATARACT EXTRACTION W/PHACO Right 04/28/2016   Procedure: CATARACT EXTRACTION PHACO AND INTRAOCULAR LENS PLACEMENT (IOC);  Surgeon: Tonny Branch, MD;  Location: [facility] ORS;  Service: Ophthalmology;  Laterality: Right;  cde-10.23   . ESOPHAGOGASTRODUODENOSCOPY (EGD) WITH PROPOFOL N/A 05/10/2019   Procedure: ESOPHAGOGASTRODUODENOSCOPY (EGD) WITH PROPOFOL with dilation as appropriate.;  Surgeon: Danie Binder, MD;  Location: [facility] ENDO SUITE;  Service: Endoscopy;  Laterality: N/A;  . EYE SURGERY Bilateral    Cat Sx OU  . JEJUNOSTOMY N/A 05/13/2019   Procedure: JEJUNOSTOMY FEEDING TUBE PLACEMENT (Procedure #2);  Surgeon: Aviva Signs, MD;  Location: [facility] ORS;  Service: General;  Laterality: N/A;  . KNEE ARTHROSCOPY Right   . NECK SURGERY    . PORTACATH PLACEMENT Right 05/13/2019   Procedure: INSERTION PORT-A-CATH (attached catheter in right subclavian) (Procedure #1);  Surgeon: Aviva Signs, MD;  Location: [facility] ORS;  Service: General;  Laterality: Right;  . ROTATOR CUFF REPAIR Right  Family History  Adopted: Yes  Problem Relation Age of Onset  . Hypertension Brother   . Kidney disease Brother     Social History   Tobacco Use  . Smoking status: Former Smoker    Types: Cigarettes    Quit date: 03/26/1989    Years since quitting: 30.3  . Smokeless tobacco: Never Used  Substance Use Topics  . Alcohol use: No  . Drug use: No    Home Medications Prior to Admission medications   Medication Sig Start Date End Date Taking?  Authorizing Provider  acetaminophen (TYLENOL) 325 MG tablet Place 2 tablets (650 mg total) into feeding tube every 6 (six) hours as needed for mild pain (or Fever >/= 101). Patient not taking: Reported on 06/07/2019 05/16/19   Elgergawy, Silver Huguenin, MD  albuterol (PROVENTIL HFA;VENTOLIN HFA) 108 (90 Base) MCG/ACT inhaler Inhale 2 puffs into the lungs every 4 (four) hours as needed for wheezing or shortness of breath.    [provider]  albuterol (PROVENTIL) (2.5 MG/3ML) 0.083% nebulizer solution Take 2.5 mg by nebulization every 6 (six) hours as needed for wheezing or shortness of breath.    [provider]  ALPRAZolam Duanne Moron) 0.5 MG tablet 1 tablet (0.5 mg total) by Per J Tube route 2 (two) times daily. 05/16/19   Elgergawy, Silver Huguenin, MD  Amino Acids-Protein Hydrolys (FEEDING SUPPLEMENT, PRO-STAT SUGAR FREE 64,) LIQD 30 mLs by Per J Tube route 2 (two) times daily. 05/16/19   Elgergawy, Silver Huguenin, MD  atorvastatin (LIPITOR) 40 MG tablet 1 tablet (40 mg total) by Per J Tube route every evening. for cholesterol Patient not taking: Reported on 06/29/2019 05/16/19   Elgergawy, Silver Huguenin, MD  CARBOPLATIN IV Inject into the vein once a week. 06/15/19   [provider]  famotidine (PEPCID) 20 MG tablet Place 1 tablet (20 mg total) into feeding tube daily. 05/16/19   Elgergawy, Silver Huguenin, MD  FLUoxetine (PROZAC) 20 MG capsule Place 1 capsule (20 mg total) into feeding tube daily. 05/16/19   Elgergawy, Silver Huguenin, MD  glipiZIDE (GLUCOTROL) 10 MG tablet Place 1 tablet (10 mg total) into feeding tube 2 (two) times daily before a meal. 05/16/19   Elgergawy, Silver Huguenin, MD  lidocaine-prilocaine (EMLA) cream Apply a pea-sized amount to port a cath site and cover with plastic wrap one hour prior to chemotherapy appointments Patient not taking: Reported on 06/22/2019 06/07/19   Derek Jack, MD  metFORMIN (GLUCOPHAGE) 1000 MG tablet 1 tablet (1,000 mg total) by Per J Tube route 2 (two) times daily. 05/16/19    Elgergawy, Silver Huguenin, MD  Nutritional Supplements (FEEDING SUPPLEMENT, OSMOLITE 1.5 CAL,) LIQD Place 1,000 mLs into feeding tube continuous. 05/16/19   Elgergawy, Silver Huguenin, MD  ondansetron (ZOFRAN-ODT) 4 MG disintegrating tablet Take 4 mg by mouth every 6 (six) hours as needed. 06/03/19   [provider]  PACLITAXEL IV Inject 50 mg/m2 into the vein once a week.  06/15/19   [provider]  pantoprazole sodium (PROTONIX) 40 mg/20 mL PACK Place 20 mLs (40 mg total) into feeding tube daily. 05/16/19   Elgergawy, Silver Huguenin, MD  prochlorperazine (COMPAZINE) 10 MG tablet Take 1 tablet (10 mg total) by mouth every 6 (six) hours as needed (Nausea or vomiting). Patient not taking: Reported on 06/22/2019 06/07/19   Derek Jack, MD  promethazine (PHENERGAN) 25 MG suppository Place 25 mg rectally every 6 (six) hours as needed. 06/03/19   [provider]  scopolamine (TRANSDERM-SCOP) 1 MG/3DAYS Place  1 patch (1.5 mg total) onto the skin every 3 (three) days. Patient not taking: Reported on 07/06/2019 06/29/19   Derek Jack, MD  terazosin (HYTRIN) 10 MG capsule 1 capsule (10 mg total) by Per J Tube route daily. 05/16/19   Elgergawy, Silver Huguenin, MD  Water For Irrigation, Sterile (FREE WATER) SOLN Place 300 mLs into feeding tube every 8 (eight) hours. 05/16/19   Elgergawy, Silver Huguenin, MD    Allergies    Eggs or egg-derived products  Review of Systems   Review of Systems  Constitutional: Positive for fatigue and fever. Negative for chills.  Respiratory: Negative for shortness of breath.   Cardiovascular: Negative for chest pain and leg swelling.  Gastrointestinal: Negative for abdominal pain, diarrhea, nausea and vomiting.  Neurological: Positive for weakness.  All other systems reviewed and are negative.  Physical Exam Updated Vital Signs BP 118/63 (BP Location: Right Arm)   Pulse (!) 126   Temp (!) 104.2 F (40.1 C) (Rectal)   Resp (!) 22   Ht 6\' 1"  (1.854 m)   Wt 89 kg    SpO2 97%   BMI 25.89 kg/m    Physical Exam Vitals and nursing note reviewed.  Constitutional:      General: He is in acute distress.     Appearance: Normal appearance. He is ill-appearing. He is not toxic-appearing or diaphoretic.  HENT:     Head: Normocephalic and atraumatic.     Nose: Nose normal.     Mouth/Throat:     Mouth: Mucous membranes are dry.     Pharynx: Oropharynx is clear. No oropharyngeal exudate or posterior oropharyngeal erythema.  Eyes:     General: No scleral icterus.       Right eye: No discharge.        Left eye: No discharge.     Extraocular Movements: Extraocular movements intact.     Conjunctiva/sclera: Conjunctivae normal.     Pupils: Pupils are equal, round, and reactive to light.  Cardiovascular:     Rate and Rhythm: Tachycardia present.     Pulses: Normal pulses.     Heart sounds: Murmur present. No friction rub. No gallop.   Pulmonary:     Comments: Lungs clear to auscultation bilaterally in the posterior lung fields, though difficult to assess due to patient effort. Abdominal:     Comments: G-tube in place.  No surrounding erythema or discharge appreciated from the site.  Protuberant abdomen that is soft and nontender in all 4 quadrants.  Skin:    General: Skin is warm and dry.     Capillary Refill: Capillary refill takes less than 2 seconds.  Neurological:     General: No focal deficit present.     Mental Status: He is alert and oriented to person, place, and time.     Comments: Patient is alert and oriented to person, place, time.  He is able to answer questions clearly and coherently.  Psychiatric:        Mood and Affect: Mood normal.        Behavior: Behavior normal.    ED Results / Procedures / Treatments   Labs (all labs ordered are listed, but only abnormal results are displayed) Labs Reviewed  LACTIC ACID, PLASMA - Abnormal; Notable for the following components:      Result Value   Lactic Acid, Venous 4.6 (*)    All other  components within normal limits  LACTIC ACID, PLASMA - Abnormal; Notable for the following components:  Lactic Acid, Venous 5.0 (*)    All other components within normal limits  COMPREHENSIVE METABOLIC PANEL - Abnormal; Notable for the following components:   Sodium 126 (*)    Chloride 90 (*)    Glucose, Bld 267 (*)    Calcium 7.8 (*)    Total Protein 5.6 (*)    Albumin 2.4 (*)    All other components within normal limits  CBC WITH DIFFERENTIAL/PLATELET - Abnormal; Notable for the following components:   WBC 0.8 (*)    RBC 2.84 (*)    Hemoglobin 8.6 (*)    HCT 25.4 (*)    Platelets 126 (*)    Neutro Abs 0.7 (*)    Lymphs Abs 0.0 (*)    Monocytes Absolute 0.0 (*)    All other components within normal limits  APTT - Abnormal; Notable for the following components:   aPTT 63 (*)    All other components within normal limits  URINALYSIS, ROUTINE W REFLEX MICROSCOPIC - Abnormal; Notable for the following components:   Glucose, UA >=500 (*)    Bacteria, UA RARE (*)    All other components within normal limits  MAGNESIUM - Abnormal; Notable for the following components:   Magnesium 1.6 (*)    All other components within normal limits  CULTURE, BLOOD (ROUTINE X 2)  CULTURE, BLOOD (ROUTINE X 2)  RESPIRATORY PANEL BY RT PCR (FLU A&B, COVID)  URINE CULTURE  SARS CORONAVIRUS 2 (TAT 6-24 HRS)  PROTIME-INR    EKG EKG Interpretation  Date/Time:  Saturday July 09 2019 08:32:19 EDT Ventricular Rate:  127 PR Interval:    QRS Duration: 84 QT Interval:  351 QTC Calculation: 511 R Axis:   62 Text Interpretation: Sinus tachycardia Low voltage, extremity and precordial leads Minimal ST depression, anterolateral leads Prolonged QT interval since last tracing no significant change Confirmed by Noemi Chapel [phone]) on 07/09/2019 8:53:57 AM   Radiology CT Chest W Contrast  Result Date: 07/09/2019 CLINICAL DATA:  Fever and history of esophageal cancer.  Sepsis. EXAM: CT CHEST, ABDOMEN, AND  PELVIS WITH CONTRAST TECHNIQUE: Multidetector CT imaging of the chest, abdomen and pelvis was performed following the standard protocol during bolus administration of intravenous contrast. CONTRAST:  169mL OMNIPAQUE IOHEXOL 300 MG/ML  SOLN COMPARISON:  05/09/2019 FINDINGS: CT CHEST FINDINGS Cardiovascular: Normal heart size. No pericardial effusion. Extensive coronary atherosclerotic calcification. Porta catheter with tip at the distal SVC. Mediastinum/Nodes: Lower esophageal mass with wall thickening and retain fluid superiorly. There is indistinct fat at the GE junction with periesophageal nodularity likely reflecting tumor. No noted perforation. Small bilateral thyroid nodules measuring 1 cm or less. No followup recommended (ref: J Am Coll Radiol. 2015 Feb;12(2): 143-50). Lungs/Pleura: Small left pleural effusion without nodularity. Left lower lobe atelectasis. Musculoskeletal: Spondylosis. Subcutaneous lipoma partially covered posterior to the right shoulder. CT ABDOMEN PELVIS FINDINGS Hepatobiliary: Vague 4 mm low-density in the subcapsular high right lobe liver.Sludge or noncalcified gallstones. The gallbladder is full but there is no acute cholecystitis. Pancreas: Unremarkable. Spleen: Unremarkable. Adrenals/Urinary Tract: Small left adrenal myelolipoma. No hydronephrosis or stone. Unremarkable bladder. Stomach/Bowel: Fluid levels reach the distal colon. No bowel wall thickening. Percutaneous jejunostomy tube in good position. Negative for bowel obstruction. Vascular/Lymphatic: No acute vascular abnormality. Diffuse atherosclerotic calcification. Mildly enlarged lymph nodes about the GE junction. Reproductive:No acute finding Other: No ascites or pneumoperitoneum. Musculoskeletal: Extensive spondylosis. No bone metastases by recent PET-CT. IMPRESSION: 1. No specific cause of sepsis. 2. There is liquid stool but no colitic wall  thickening. 3. Small left pleural effusion with atelectasis. 4. Known obstructive  lower esophageal mass. Subcentimeter low-density in the upper right liver, attention on follow-up. 5. Cholelithiasis. Electronically Signed   By: Monte Fantasia M.D.   On: 07/09/2019 10:27   CT ABDOMEN PELVIS W CONTRAST  Result Date: 07/09/2019 CLINICAL DATA:  Fever and history of esophageal cancer.  Sepsis. EXAM: CT CHEST, ABDOMEN, AND PELVIS WITH CONTRAST TECHNIQUE: Multidetector CT imaging of the chest, abdomen and pelvis was performed following the standard protocol during bolus administration of intravenous contrast. CONTRAST:  131mL OMNIPAQUE IOHEXOL 300 MG/ML  SOLN COMPARISON:  05/09/2019 FINDINGS: CT CHEST FINDINGS Cardiovascular: Normal heart size. No pericardial effusion. Extensive coronary atherosclerotic calcification. Porta catheter with tip at the distal SVC. Mediastinum/Nodes: Lower esophageal mass with wall thickening and retain fluid superiorly. There is indistinct fat at the GE junction with periesophageal nodularity likely reflecting tumor. No noted perforation. Small bilateral thyroid nodules measuring 1 cm or less. No followup recommended (ref: J Am Coll Radiol. 2015 Feb;12(2): 143-50). Lungs/Pleura: Small left pleural effusion without nodularity. Left lower lobe atelectasis. Musculoskeletal: Spondylosis. Subcutaneous lipoma partially covered posterior to the right shoulder. CT ABDOMEN PELVIS FINDINGS Hepatobiliary: Vague 4 mm low-density in the subcapsular high right lobe liver.Sludge or noncalcified gallstones. The gallbladder is full but there is no acute cholecystitis. Pancreas: Unremarkable. Spleen: Unremarkable. Adrenals/Urinary Tract: Small left adrenal myelolipoma. No hydronephrosis or stone. Unremarkable bladder. Stomach/Bowel: Fluid levels reach the distal colon. No bowel wall thickening. Percutaneous jejunostomy tube in good position. Negative for bowel obstruction. Vascular/Lymphatic: No acute vascular abnormality. Diffuse atherosclerotic calcification. Mildly enlarged lymph  nodes about the GE junction. Reproductive:No acute finding Other: No ascites or pneumoperitoneum. Musculoskeletal: Extensive spondylosis. No bone metastases by recent PET-CT. IMPRESSION: 1. No specific cause of sepsis. 2. There is liquid stool but no colitic wall thickening. 3. Small left pleural effusion with atelectasis. 4. Known obstructive lower esophageal mass. Subcentimeter low-density in the upper right liver, attention on follow-up. 5. Cholelithiasis. Electronically Signed   By: Monte Fantasia M.D.   On: 07/09/2019 10:27   DG Chest Port 1 View  Result Date: 07/09/2019 CLINICAL DATA:  Fever.  History of esophageal carcinoma EXAM: PORTABLE CHEST 1 VIEW COMPARISON:  July 08, 2019 FINDINGS: Port-A-Cath tip is in the superior vena cava. No pneumothorax. There is a small left pleural effusion. Lungs elsewhere are clear. Heart size and pulmonary vascularity are normal. No appreciable mediastinal widening. There is aortic atherosclerosis. No evident adenopathy. Postoperative change noted in lower cervical spine. Postoperative change also noted in right shoulder. IMPRESSION: Small left pleural effusion. Lungs elsewhere clear. Stable cardiac silhouette. Aortic Atherosclerosis (ICD10-I70.0). Port-A-Cath tip in superior vena cava.  No pneumothorax. Electronically Signed   By: Lowella Grip III M.D.   On: 07/09/2019 09:05   DG Chest Port 1 View  Result Date: 07/08/2019 CLINICAL DATA:  Cough and shortness of breath EXAM: PORTABLE CHEST 1 VIEW COMPARISON:  None. FINDINGS: The heart size and mediastinal contours are within normal limits. Both lungs are clear. The visualized skeletal structures are unremarkable. There is a right chest wall power-injectable Port-A-Cath with tip in the lower SVC via a right internal jugular vein approach. IMPRESSION: No active disease. Electronically Signed   By: Ulyses Jarred M.D.   On: 07/08/2019 03:46    Procedures .Critical Care Performed by: Rayna Sexton,  PA-C Authorized by: Rayna Sexton, PA-C   Critical care provider statement:    Critical care time (minutes):  45   Critical care was  time spent personally by me on the following activities:  Discussions with consultants, evaluation of patient's response to treatment, examination of patient, ordering and performing treatments and interventions, ordering and review of laboratory studies, ordering and review of radiographic studies, pulse oximetry, re-evaluation of patient's condition, obtaining history from patient or surrogate and review of old charts    Medications Ordered in ED Medications  0.9 %  sodium chloride infusion (1,000 mLs Intravenous New Bag/Given 07/09/19 0849)  norepinephrine (LEVOPHED) 4mg  in 296mL premix infusion (2 mcg/min Intravenous New Bag/Given 07/09/19 1211)  cefTRIAXone (ROCEPHIN) 2 g in sodium chloride 0.9 % 100 mL IVPB (0 g Intravenous Stopped 07/09/19 0933)  ibuprofen (ADVIL) 100 MG/5ML suspension 600 mg (600 mg Oral Given 07/09/19 0902)  lactated ringers bolus 1,000 mL (0 mLs Intravenous Stopped 07/09/19 1035)    And  lactated ringers bolus 1,000 mL (0 mLs Intravenous Stopped 07/09/19 1130)    And  lactated ringers bolus 1,000 mL (0 mLs Intravenous Stopped 07/09/19 1212)  iohexol (OMNIPAQUE) 300 MG/ML solution 100 mL (100 mLs Intravenous Contrast Given 07/09/19 0953)  ceFEPIme (MAXIPIME) 2 g in sodium chloride 0.9 % 100 mL IVPB (0 g Intravenous Stopped 07/09/19 1130)  sodium chloride 0.9 % bolus 1,000 mL (1,000 mLs Intravenous New Bag/Given 07/09/19 1209)   ED Course  I have reviewed the triage vital signs and the nursing notes.  Pertinent labs & imaging results that were available during my care of the patient were reviewed by me and considered in my medical decision making (see chart for details).    MDM Rules/Calculators/A&P                      8:53 AM patient is a 74 year old male with adenocarcinoma of the esophagus.  He was seen in the emergency department yesterday  for "weakness" and was ultimately discharged with a reassuring work-up.  He represents today with an elevated temperature of 104.2 Fahrenheit rectally.  He is tachycardic in the 130s during my exam.  Initiated code sepsis.  His wife told EMS that he was given 650 of APAP through the G-tube prior to coming to the hospital.  Will give ibuprofen additionally through G-tube.  Covid swab ordered.  He is currently followed by Derek Jack in oncology and is on chemoradiation with his last dose being March 31.  Patient was discussed with and evaluated by my attending physician Dr. Noemi Chapel who agrees with the above plan.  We will closely monitor.  Patient will likely be admitted.  9:03 AM CXR shows a small left-sided pleural effusion, otherwise no acute changes.  Lactic acid 4.6.  Leukopenia at 0.8 as well as neutropenia 0.7.  He was started on sepsis dose of LR. I ordered CT scan of the chest, abdomen, pelvis with contrast.  Patient started on cefepime for neutropenic fever.  10:51 AM Pt was discussed with hospitalist and he will be admitted.  CT scan of the chest, abdomen, pelvis shows no specific source of sepsis.  Will discuss this with patient and his wife.  Patient understands that he is going to be admitted.  Vitals:   07/09/19 1100 07/09/19 1104 07/09/19 1115 07/09/19 1128  BP: (!) 97/54   (!) 93/59  Pulse:      Resp:  13 (!) 24 (!) 31  Temp:      TempSrc:      SpO2: 97% 98% 98% 97%  Weight:      Height:  This patient is on chemotherapy and complains of fever, this involves an extensive number of treatment options, and is a complaint that carries with it a high risk of complications and morbidity.  The differential diagnosis includes sepsis, neutropenic fever, idiopathic    I Ordered, reviewed, and interpreted labs, which included cbc, cmp, pt/inr, aptt, ua, blood cultures, lactic acid  I ordered ibuprofen for his fever and pain, cefepime for neutropenic fever, LR for  sepsis    I ordered imaging studies which included CXR and CT of the chest/abd/pelvis  I independently visualized and interpreted imaging which showed left sided pleural effusion as well as cholelithiasis   Additional history obtained from old records, EMS, family member, etc  I consulted hospitalist and discussed lab and imaging findings   Note: Portions of this report may have been transcribed using voice recognition software. Every effort was made to ensure accuracy; however, inadvertent computerized transcription errors may be present.   Final Clinical Impression(s) / ED Diagnoses Final diagnoses:  Neutropenic fever ([place])  Sepsis, due to unspecified organism, unspecified whether acute organ dysfunction present [facility])    Rx / DC Orders ED Discharge Orders    None       Rayna Sexton, PA-C 07/09/19 1243    Noemi Chapel, MD 07/12/19 [phone]

## 2019-07-09 NOTE — ED Notes (Signed)
Provider in to speak w family

## 2019-07-09 NOTE — ED Notes (Signed)
Per spouse recent admission to  [facility]  Here with evaluation yesterday

## 2019-07-09 NOTE — ED Notes (Signed)
Call to unit to iform covid neg

## 2019-07-09 NOTE — ED Notes (Signed)
Date and time results received: 07/09/19 09156 (use smartphrase ".now" to insert current time)  Test: lactic acid Critical Value: 4.56  Name of Provider Notified: Dr. Sabra Heck  Orders Received? Or Actions Taken?: n/a

## 2019-07-09 NOTE — H&P (Signed)
History and Physical    Kurt Duran [id number] DOB: 19-May-1945 DOA: 07/09/2019  PCP: Glenda Chroman, MD  Patient coming from: Home  I have personally briefly reviewed patient's old medical records in [place]  Chief Complaint: Cough, generalized weakness  HPI: Kurt Duran is a 74 y.o. male with medical history significant of esophageal cancer who is receiving chemotherapy as well as radiation treatments.  Patient has an obstructive lesion and is PEG tube for nutrition/meds.  His last chemotherapy was on 3/31.  Reports of the past 10 days, he has had increasing secretions and cough.  He was prescribed scopolamine patch which caused dry mouth, but did not improve his respiratory secretions.  He has been feeling generally weak and unwell.  He came to the ER yesterday wife reports that his temperature was 100.34F prior to the visit.  During his ER stay, his labs were reassuring and he felt better and therefore discharged home.  Upon returning home, he had worsening fever this morning developed chills.  He was brought back to the emergency room where he was found to have a temperature of 104F.  Is also noted to be hypotensive and tachycardic.  Lactic acid markedly elevated over 4.  He was started on IV fluids per sepsis protocol.  CT of the abdomen pelvis as well as chest did not show any source of sepsis.  Coronavirus test was also negative.  He was noted to be neutropenic.  He has been referred for admission   Review of Systems: As per HPI otherwise 10 point review of systems negative.    Past Medical History:  Diagnosis Date  . Anxiety   . Arthritis   . COPD (chronic obstructive pulmonary disease) ([place])   . Diabetes mellitus without complication ([place])   . Diabetic retinopathy ([place])    NPDR OU  . Esophageal cancer ([place])   . GERD (gastroesophageal reflux disease)   . Gout   . Hypertension   . Hypertensive retinopathy    OU  . Port-A-Cath in place 06/07/2019    Past  Surgical History:  Procedure Laterality Date  . BIOPSY  05/10/2019   Procedure: BIOPSY;  Surgeon: Danie Binder, MD;  Location: [facility];  Service: Endoscopy;;  gastric esophageal  . CATARACT EXTRACTION W/PHACO Left 04/03/2016   Procedure: CATARACT EXTRACTION PHACO AND INTRAOCULAR LENS PLACEMENT LEFT EYE CDE= 11.33;  Surgeon: Tonny Branch, MD;  Location: [facility];  Service: Ophthalmology;  Laterality: Left;  left  . CATARACT EXTRACTION W/PHACO Right 04/28/2016   Procedure: CATARACT EXTRACTION PHACO AND INTRAOCULAR LENS PLACEMENT (IOC);  Surgeon: Tonny Branch, MD;  Location: [facility];  Service: Ophthalmology;  Laterality: Right;  [id number]   . ESOPHAGOGASTRODUODENOSCOPY (EGD) WITH PROPOFOL N/A 05/10/2019   Procedure: ESOPHAGOGASTRODUODENOSCOPY (EGD) WITH PROPOFOL with dilation as appropriate.;  Surgeon: Danie Binder, MD;  Location: [facility];  Service: Endoscopy;  Laterality: N/A;  . EYE SURGERY Bilateral    Cat Sx OU  . JEJUNOSTOMY N/A 05/13/2019   Procedure: JEJUNOSTOMY FEEDING TUBE PLACEMENT (Procedure #2);  Surgeon: Aviva Signs, MD;  Location: [facility];  Service: General;  Laterality: N/A;  . KNEE ARTHROSCOPY Right   . NECK SURGERY    . PORTACATH PLACEMENT Right 05/13/2019   Procedure: INSERTION PORT-A-CATH (attached catheter in right subclavian) (Procedure #1);  Surgeon: Aviva Signs, MD;  Location: [facility];  Service: General;  Laterality: Right;  . ROTATOR CUFF REPAIR Right     Social History:  reports that  he quit smoking about 30 years ago. His smoking use included cigarettes. He has never used smokeless tobacco. He reports that he does not drink alcohol or use drugs.  Allergies  Allergen Reactions  . Eggs Or Egg-Derived Products Nausea And Vomiting    Family History  Adopted: Yes  Problem Relation Age of Onset  . Hypertension Brother   . Kidney disease Brother      Prior to Admission medications   Medication Sig Start Date End Date Taking? Authorizing Provider    acetaminophen (TYLENOL) 325 MG tablet Place 2 tablets (650 mg total) into feeding tube every 6 (six) hours as needed for mild pain (or Fever >/= 101). 05/16/19  Yes Elgergawy, Silver Huguenin, MD  albuterol (PROVENTIL HFA;VENTOLIN HFA) 108 (90 Base) MCG/ACT inhaler Inhale 2 puffs into the lungs every 4 (four) hours as needed for wheezing or shortness of breath.   Yes [provider]  albuterol (PROVENTIL) (2.5 MG/3ML) 0.083% nebulizer solution Take 2.5 mg by nebulization every 6 (six) hours as needed for wheezing or shortness of breath.   Yes [provider]  ALPRAZolam (XANAX) 0.5 MG tablet 1 tablet (0.5 mg total) by Per J Tube route 2 (two) times daily. 05/16/19  Yes Elgergawy, Silver Huguenin, MD  Amino Acids-Protein Hydrolys (FEEDING SUPPLEMENT, PRO-STAT SUGAR FREE 64,) LIQD 30 mLs by Per J Tube route 2 (two) times daily. 05/16/19  Yes Elgergawy, Silver Huguenin, MD  famotidine (PEPCID) 20 MG tablet Place 1 tablet (20 mg total) into feeding tube daily. 05/16/19  Yes Elgergawy, Silver Huguenin, MD  FLUoxetine (PROZAC) 20 MG capsule Place 1 capsule (20 mg total) into feeding tube daily. 05/16/19  Yes Elgergawy, Silver Huguenin, MD  glipiZIDE (GLUCOTROL) 10 MG tablet Place 1 tablet (10 mg total) into feeding tube 2 (two) times daily before a meal. Patient taking differently: Place 5-10 mg into feeding tube 2 (two) times daily before a meal. Take 10 mg in the morning and 5 mg in the evening 05/16/19  Yes Elgergawy, Silver Huguenin, MD  lidocaine-prilocaine (EMLA) cream Apply a pea-sized amount to port a cath site and cover with plastic wrap one hour prior to chemotherapy appointments 06/07/19  Yes Derek Jack, MD  metFORMIN (GLUCOPHAGE) 1000 MG tablet 1 tablet (1,000 mg total) by Per J Tube route 2 (two) times daily. 05/16/19  Yes Elgergawy, Silver Huguenin, MD  Nutritional Supplements (FEEDING SUPPLEMENT, OSMOLITE 1.5 CAL,) LIQD Place 1,000 mLs into feeding tube continuous. 05/16/19  Yes Elgergawy, Silver Huguenin, MD  ondansetron (ZOFRAN-ODT) 4  MG disintegrating tablet Take 4 mg by mouth every 6 (six) hours as needed. 06/03/19  Yes [provider]  PACLITAXEL IV Inject 50 mg/m2 into the vein once a week.  06/15/19  Yes [provider]  pantoprazole sodium (PROTONIX) 40 mg/20 mL PACK Place 20 mLs (40 mg total) into feeding tube daily. 05/16/19  Yes Elgergawy, Silver Huguenin, MD  prochlorperazine (COMPAZINE) 10 MG tablet Take 1 tablet (10 mg total) by mouth every 6 (six) hours as needed (Nausea or vomiting). 06/07/19  Yes Derek Jack, MD  terazosin (HYTRIN) 10 MG capsule 1 capsule (10 mg total) by Per J Tube route daily. 05/16/19  Yes Elgergawy, Silver Huguenin, MD  Water For Irrigation, Sterile (FREE WATER) SOLN Place 300 mLs into feeding tube every 8 (eight) hours. 05/16/19  Yes Elgergawy, Silver Huguenin, MD  atorvastatin (LIPITOR) 40 MG tablet 1 tablet (40 mg total) by Per J Tube route every evening. for cholesterol Patient not taking: Reported on  06/29/2019 05/16/19   Elgergawy, Silver Huguenin, MD  CARBOPLATIN IV Inject into the vein once a week. 06/15/19   [provider]  promethazine (PHENERGAN) 25 MG suppository Place 25 mg rectally every 6 (six) hours as needed. 06/03/19   [provider]  scopolamine (TRANSDERM-SCOP) 1 MG/3DAYS Place 1 patch (1.5 mg total) onto the skin every 3 (three) days. Patient not taking: Reported on 07/06/2019 06/29/19   Derek Jack, MD    Physical Exam: Vitals:   07/09/19 1330 07/09/19 1414 07/09/19 1428 07/09/19 1500  BP: 99/61 97/65  99/65  Pulse:   (!) 109 (!) 105  Resp: (!) 24  20 (!) 22  Temp:  98.2 F (36.8 C)    TempSrc:  Oral    SpO2: 97%  99% 99%  Weight:  93.7 kg    Height:  6\' 1"  (1.854 m)      Constitutional: NAD, calm, comfortable Eyes: PERRL, lids and conjunctivae normal ENMT: Mucous membranes are moist. Posterior pharynx clear of any exudate or lesions.Normal dentition.  Neck: normal, supple, no masses, no thyromegaly Respiratory: clear to auscultation bilaterally,  no wheezing, no crackles. Normal respiratory effort. No accessory muscle use.  Cardiovascular: Regular rate and rhythm, no murmurs / rubs / gallops. 2+ pedal pulses. No carotid bruits.  Abdomen: no tenderness, no masses palpated. No hepatosplenomegaly. Bowel sounds positive.  PEG tube in place with mild erythema around insertion site Musculoskeletal: no clubbing / cyanosis. No joint deformity upper and lower extremities. Good ROM, no contractures. Normal muscle tone.  Trace pedal edema bilaterally Skin: no rashes, lesions, ulcers. No induration Neurologic: CN 2-12 grossly intact. Sensation intact, DTR normal. Strength 5/5 in all 4.  Psychiatric: Normal judgment and insight. Alert and oriented x 3. Normal mood.    Labs on Admission: I have personally reviewed following labs and imaging studies  CBC: Recent Labs  Lab 07/06/19 0852 07/08/19 0317 07/09/19 0844  WBC 3.2* 3.7* 0.8*  NEUTROABS 2.7 3.3 0.7*  HGB 9.5* 8.6* 8.6*  HCT 27.7* 25.5* 25.4*  MCV 89.1 88.9 89.4  PLT 142* 120* 123XX123*   Basic Metabolic Panel: Recent Labs  Lab 07/06/19 0852 07/08/19 0317 07/09/19 0844  NA 129* 129* 126*  K 3.9 4.3 3.7  CL 90* 90* 90*  CO2 27 25 23   GLUCOSE 178* 196* 267*  BUN 11 13 14   CREATININE 0.51* 0.52* 0.61  CALCIUM 8.2* 7.6* 7.8*  MG 1.6*  --  1.6*   GFR: Estimated Creatinine Clearance: 95.7 mL/min (by C-G formula based on SCr of 0.61 mg/dL). Liver Function Tests: Recent Labs  Lab 07/06/19 0852 07/08/19 0317 07/09/19 0844  AST 21 20 28   ALT 27 23 24   ALKPHOS 88 85 111  BILITOT 0.4 0.4 0.6  PROT 5.8* 5.8* 5.6*  ALBUMIN 2.6* 2.5* 2.4*   No results for input(s): LIPASE, AMYLASE in the last 168 hours. No results for input(s): AMMONIA in the last 168 hours. Coagulation Profile: Recent Labs  Lab 07/09/19 0844  INR 1.2   Cardiac Enzymes: No results for input(s): CKTOTAL, CKMB, CKMBINDEX, TROPONINI in the last 168 hours. BNP (last 3 results) No results for input(s): PROBNP  in the last 8760 hours. HbA1C: No results for input(s): HGBA1C in the last 72 hours. CBG: No results for input(s): GLUCAP in the last 168 hours. Lipid Profile: No results for input(s): CHOL, HDL, LDLCALC, TRIG, CHOLHDL, LDLDIRECT in the last 72 hours. Thyroid Function Tests: No results for input(s): TSH, T4TOTAL, FREET4, T3FREE, THYROIDAB in the  last 72 hours. Anemia Panel: No results for input(s): VITAMINB12, FOLATE, FERRITIN, TIBC, IRON, RETICCTPCT in the last 72 hours. Urine analysis:    Component Value Date/Time   COLORURINE YELLOW 07/09/2019 0850   APPEARANCEUR CLEAR 07/09/2019 0850   LABSPEC 1.018 07/09/2019 0850   PHURINE 5.0 07/09/2019 0850   GLUCOSEU >=500 (A) 07/09/2019 0850   HGBUR NEGATIVE 07/09/2019 0850   BILIRUBINUR NEGATIVE 07/09/2019 0850   KETONESUR NEGATIVE 07/09/2019 0850   PROTEINUR NEGATIVE 07/09/2019 0850   NITRITE NEGATIVE 07/09/2019 0850   LEUKOCYTESUR NEGATIVE 07/09/2019 0850    Radiological Exams on Admission: CT Chest W Contrast  Result Date: 07/09/2019 CLINICAL DATA:  Fever and history of esophageal cancer.  Sepsis. EXAM: CT CHEST, ABDOMEN, AND PELVIS WITH CONTRAST TECHNIQUE: Multidetector CT imaging of the chest, abdomen and pelvis was performed following the standard protocol during bolus administration of intravenous contrast. CONTRAST:  114mL OMNIPAQUE IOHEXOL 300 MG/ML  SOLN COMPARISON:  05/09/2019 FINDINGS: CT CHEST FINDINGS Cardiovascular: Normal heart size. No pericardial effusion. Extensive coronary atherosclerotic calcification. Porta catheter with tip at the distal SVC. Mediastinum/Nodes: Lower esophageal mass with wall thickening and retain fluid superiorly. There is indistinct fat at the GE junction with periesophageal nodularity likely reflecting tumor. No noted perforation. Small bilateral thyroid nodules measuring 1 cm or less. No followup recommended (ref: J Am Coll Radiol. 2015 Feb;12(2): 143-50). Lungs/Pleura: Small left pleural effusion  without nodularity. Left lower lobe atelectasis. Musculoskeletal: Spondylosis. Subcutaneous lipoma partially covered posterior to the right shoulder. CT ABDOMEN PELVIS FINDINGS Hepatobiliary: Vague 4 mm low-density in the subcapsular high right lobe liver.Sludge or noncalcified gallstones. The gallbladder is full but there is no acute cholecystitis. Pancreas: Unremarkable. Spleen: Unremarkable. Adrenals/Urinary Tract: Small left adrenal myelolipoma. No hydronephrosis or stone. Unremarkable bladder. Stomach/Bowel: Fluid levels reach the distal colon. No bowel wall thickening. Percutaneous jejunostomy tube in good position. Negative for bowel obstruction. Vascular/Lymphatic: No acute vascular abnormality. Diffuse atherosclerotic calcification. Mildly enlarged lymph nodes about the GE junction. Reproductive:No acute finding Other: No ascites or pneumoperitoneum. Musculoskeletal: Extensive spondylosis. No bone metastases by recent PET-CT. IMPRESSION: 1. No specific cause of sepsis. 2. There is liquid stool but no colitic wall thickening. 3. Small left pleural effusion with atelectasis. 4. Known obstructive lower esophageal mass. Subcentimeter low-density in the upper right liver, attention on follow-up. 5. Cholelithiasis. Electronically Signed   By: Monte Fantasia M.D.   On: 07/09/2019 10:27   CT ABDOMEN PELVIS W CONTRAST  Result Date: 07/09/2019 CLINICAL DATA:  Fever and history of esophageal cancer.  Sepsis. EXAM: CT CHEST, ABDOMEN, AND PELVIS WITH CONTRAST TECHNIQUE: Multidetector CT imaging of the chest, abdomen and pelvis was performed following the standard protocol during bolus administration of intravenous contrast. CONTRAST:  111mL OMNIPAQUE IOHEXOL 300 MG/ML  SOLN COMPARISON:  05/09/2019 FINDINGS: CT CHEST FINDINGS Cardiovascular: Normal heart size. No pericardial effusion. Extensive coronary atherosclerotic calcification. Porta catheter with tip at the distal SVC. Mediastinum/Nodes: Lower esophageal mass  with wall thickening and retain fluid superiorly. There is indistinct fat at the GE junction with periesophageal nodularity likely reflecting tumor. No noted perforation. Small bilateral thyroid nodules measuring 1 cm or less. No followup recommended (ref: J Am Coll Radiol. 2015 Feb;12(2): 143-50). Lungs/Pleura: Small left pleural effusion without nodularity. Left lower lobe atelectasis. Musculoskeletal: Spondylosis. Subcutaneous lipoma partially covered posterior to the right shoulder. CT ABDOMEN PELVIS FINDINGS Hepatobiliary: Vague 4 mm low-density in the subcapsular high right lobe liver.Sludge or noncalcified gallstones. The gallbladder is full but there is no acute cholecystitis. Pancreas:  Unremarkable. Spleen: Unremarkable. Adrenals/Urinary Tract: Small left adrenal myelolipoma. No hydronephrosis or stone. Unremarkable bladder. Stomach/Bowel: Fluid levels reach the distal colon. No bowel wall thickening. Percutaneous jejunostomy tube in good position. Negative for bowel obstruction. Vascular/Lymphatic: No acute vascular abnormality. Diffuse atherosclerotic calcification. Mildly enlarged lymph nodes about the GE junction. Reproductive:No acute finding Other: No ascites or pneumoperitoneum. Musculoskeletal: Extensive spondylosis. No bone metastases by recent PET-CT. IMPRESSION: 1. No specific cause of sepsis. 2. There is liquid stool but no colitic wall thickening. 3. Small left pleural effusion with atelectasis. 4. Known obstructive lower esophageal mass. Subcentimeter low-density in the upper right liver, attention on follow-up. 5. Cholelithiasis. Electronically Signed   By: Monte Fantasia M.D.   On: 07/09/2019 10:27   DG Chest Port 1 View  Result Date: 07/09/2019 CLINICAL DATA:  Fever.  History of esophageal carcinoma EXAM: PORTABLE CHEST 1 VIEW COMPARISON:  July 08, 2019 FINDINGS: Port-A-Cath tip is in the superior vena cava. No pneumothorax. There is a small left pleural effusion. Lungs elsewhere are  clear. Heart size and pulmonary vascularity are normal. No appreciable mediastinal widening. There is aortic atherosclerosis. No evident adenopathy. Postoperative change noted in lower cervical spine. Postoperative change also noted in right shoulder. IMPRESSION: Small left pleural effusion. Lungs elsewhere clear. Stable cardiac silhouette. Aortic Atherosclerosis (ICD10-I70.0). Port-A-Cath tip in superior vena cava.  No pneumothorax. Electronically Signed   By: Lowella Grip III M.D.   On: 07/09/2019 09:05   DG Chest Port 1 View  Result Date: 07/08/2019 CLINICAL DATA:  Cough and shortness of breath EXAM: PORTABLE CHEST 1 VIEW COMPARISON:  None. FINDINGS: The heart size and mediastinal contours are within normal limits. Both lungs are clear. The visualized skeletal structures are unremarkable. There is a right chest wall power-injectable Port-A-Cath with tip in the lower SVC via a right internal jugular vein approach. IMPRESSION: No active disease. Electronically Signed   By: Ulyses Jarred M.D.   On: 07/08/2019 03:46    EKG: Independently reviewed. 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 Severe sepsis with septic shock.  Patient was noted to be hypotensive, febrile and tachycardic on arrival.  He is also neutropenic.  Blood culture sent in the emergency room he received IV fluid boluses per sepsis protocol.  Despite receiving 3 to 4 L of fluid, remained hypotensive.  Started on low-dose norepinephrine infusion through his IV port.  Overall alertness is now trending down.  Continue on antibiotics. 2. Febrile neutropenia.   No clear source of infection at this time, although we need to repeat chest imaging tomorrow once he is better hydrated.  He has been started on cefepime.  Cultures have been sent. 3. Hyponatremia.  Related to volume depletion.  Continue IV hydration. 4. Pancytopenia.  Likely related to recent chemotherapy.  Continue to treat supportively and monitor. 5. Diabetes.  Currently on glipizide.  Will hold for now and start on sliding scale insulin. 6. Esophageal adenocarcinoma.  He is followed by oncology for chemotherapy and radiation treatments.  Plans are to follow-up with cardiothoracic surgery as an outpatient to consider resection at some point.  Continue PEG  tube feedings 7. GERD.  Continue on PPI and H2 blockers.  DVT prophylaxis: heparin Code Status: DNR  Family Communication: discussed with wife at the bedside  Disposition Plan: discharge home once hemodynamics have stabilized  Consults called:   Admission status: inpatient, ICU   Kathie Dike MD [facility]   If 7PM-7AM, please contact night-coverage [email or web address]   07/09/2019, 3:38 PM

## 2019-07-09 NOTE — Progress Notes (Signed)
Notified provider of need to draw repeat lactic acid. 

## 2019-07-09 NOTE — ED Triage Notes (Signed)
Patient presents to the ED with a fever since yesterday. Spouse had already given patient 2 325 Tylenol in his gtube this morning.  Patient has history of esophageal cancer.

## 2019-07-09 NOTE — ED Notes (Signed)
Date and time results received: 07/09/19 0921 (use smartphrase ".now" to insert current time)  Test: WBC Critical Value: 0.8  Name of Provider Notified: PA Verne Grain  Orders Received? Or Actions Taken?: n/a

## 2019-07-09 NOTE — ED Provider Notes (Signed)
This patient is a 74 year old male, he has a known history of adenocarcinoma of his esophagus, he has a G-tube for feeds as he is unable to swallow, he follows with the cancer center and is currently getting treatment by Dr. Delton Coombes.  He has known GE junction adenocarcinoma, he is to be a surgical candidate once he completes chemoradiation therapy he has received his fourth week of treatment as of a week ago.  He has been having lots of frothy sputum, the scopolamine patch was causing symptoms such as dry mouth so he no longer takes it  He was seen yesterday due to coughing up a frothy sputum.  X-ray and testing was unremarkable, vital signs showed that he had minimal tachycardia was otherwise well-appearing  Presents again today by EMS after having fever tachycardia and increased coughing overnight.  On my exam the patient has a soft nontender abdomen, lung sounds are overall clear, he is tachycardic and hot to the touch.  Mucous membranes appear moist, there is no significant edema of his legs, he is alert and able to follow commands though he is hard of hearing.  This patient will need a sepsis work-up, will likely need to be admitted to the hospital, his fever and tachycardia alone raise suspicion for sepsis.  Code sepsis will be initiated, this patient is critically ill with what could be a severe infection especially given his immunocompromise  .Critical Care Performed by: Noemi Chapel, MD Authorized by: Noemi Chapel, MD   Critical care provider statement:    Critical care time (minutes):  35   Critical care time was exclusive of:  Separately billable procedures and treating other patients and teaching time   Critical care was necessary to treat or prevent imminent or life-threatening deterioration of the following conditions:  Sepsis   Critical care was time spent personally by me on the following activities:  Blood draw for specimens, development of treatment plan with patient or  surrogate, discussions with consultants, evaluation of patient's response to treatment, examination of patient, obtaining history from patient or surrogate, ordering and performing treatments and interventions, ordering and review of laboratory studies, ordering and review of radiographic studies, pulse oximetry, re-evaluation of patient's condition and review of old charts   104.2 by rectal temp.   EKG Interpretation  Date/Time:  Saturday July 09 2019 08:32:19 EDT Ventricular Rate:  127 PR Interval:    QRS Duration: 84 QT Interval:  351 QTC Calculation: 511 R Axis:   62 Text Interpretation: Sinus tachycardia Low voltage, extremity and precordial leads Minimal ST depression, anterolateral leads Prolonged QT interval since last tracing no significant change Confirmed by Noemi Chapel [phone]) on 07/09/2019 8:53:57 AM       Dionne Bucy Thurmond Butts was evaluated in Emergency Department on 07/12/2019 for the symptoms described in the history of present illness. He was evaluated in the context of the global COVID-19 pandemic, which necessitated consideration that the patient might be at risk for infection with the SARS-CoV-2 virus that causes COVID-19. Institutional protocols and algorithms that pertain to the evaluation of patients at risk for COVID-19 are in a state of rapid change based on information released by regulatory bodies including the CDC and federal and state organizations. These policies and algorithms were followed during the patient's care in the ED.   Medical screening examination/treatment/procedure(s) were conducted as a shared visit with non-physician practitioner(s) and myself.  I personally evaluated the patient during the encounter.  Clinical Impression:   Final diagnoses:  Neutropenic fever (  [place])  Sepsis, due to unspecified organism, unspecified whether acute organ dysfunction present [facility])         Noemi Chapel, MD 07/12/19 [phone]

## 2019-07-09 NOTE — Progress Notes (Signed)
CRITICAL VALUE ALERT  Critical Value:  Lactic Acid 2.7  Date & Time Notied:  07/09/2019 1631  Provider Notified: Memon  Orders Received/Actions taken: No new orders at this time

## 2019-07-09 NOTE — ED Notes (Signed)
Pt awake   More alert  Reports "better"  spouse at bedside   Awaiting transfer to unit

## 2019-07-09 NOTE — Progress Notes (Signed)
Notified bedside nurse of need to draw repeat lactic acid at 1300.

## 2019-07-09 NOTE — ED Notes (Signed)
resp panel to lab

## 2019-07-10 ENCOUNTER — Encounter (HOSPITAL_COMMUNITY): Payer: Self-pay | Admitting: Internal Medicine

## 2019-07-10 DIAGNOSIS — K920 Hematemesis: Secondary | ICD-10-CM

## 2019-07-10 DIAGNOSIS — C159 Malignant neoplasm of esophagus, unspecified: Secondary | ICD-10-CM

## 2019-07-10 LAB — CBC
HCT: 20.9 % — ABNORMAL LOW (ref 39.0–52.0)
Hemoglobin: 7.1 g/dL — ABNORMAL LOW (ref 13.0–17.0)
MCH: 30.9 pg (ref 26.0–34.0)
MCHC: 34 g/dL (ref 30.0–36.0)
MCV: 90.9 fL (ref 80.0–100.0)
Platelets: 122 10*3/uL — ABNORMAL LOW (ref 150–400)
RBC: 2.3 MIL/uL — ABNORMAL LOW (ref 4.22–5.81)
RDW: 14.6 % (ref 11.5–15.5)
WBC: 1.9 10*3/uL — ABNORMAL LOW (ref 4.0–10.5)
nRBC: 0 % (ref 0.0–0.2)

## 2019-07-10 LAB — COMPREHENSIVE METABOLIC PANEL
ALT: 18 U/L (ref 0–44)
AST: 15 U/L (ref 15–41)
Albumin: 2.1 g/dL — ABNORMAL LOW (ref 3.5–5.0)
Alkaline Phosphatase: 90 U/L (ref 38–126)
Anion gap: 7 (ref 5–15)
BUN: 14 mg/dL (ref 8–23)
CO2: 27 mmol/L (ref 22–32)
Calcium: 7.7 mg/dL — ABNORMAL LOW (ref 8.9–10.3)
Chloride: 96 mmol/L — ABNORMAL LOW (ref 98–111)
Creatinine, Ser: 0.49 mg/dL — ABNORMAL LOW (ref 0.61–1.24)
GFR calc Af Amer: >60 mL/min (ref >60)
GFR calc non Af Amer: >60 mL/min (ref >60)
Glucose, Bld: 263 mg/dL — ABNORMAL HIGH (ref 70–99)
Potassium: 3.9 mmol/L (ref 3.5–5.1)
Sodium: 130 mmol/L — ABNORMAL LOW (ref 135–145)
Total Bilirubin: 0.3 mg/dL (ref 0.3–1.2)
Total Protein: 4.9 g/dL — ABNORMAL LOW (ref 6.5–8.1)

## 2019-07-10 LAB — HEMOGLOBIN AND HEMATOCRIT, BLOOD
HCT: 21.7 % — ABNORMAL LOW (ref 39.0–52.0)
Hemoglobin: 7.2 g/dL — ABNORMAL LOW (ref 13.0–17.0)

## 2019-07-10 LAB — CBC WITH DIFFERENTIAL/PLATELET
Abs Immature Granulocytes: 0.11 10*3/uL — ABNORMAL HIGH (ref 0.00–0.07)
Basophils Absolute: 0 10*3/uL (ref 0.0–0.1)
Basophils Relative: 1 %
Eosinophils Absolute: 0 10*3/uL (ref 0.0–0.5)
Eosinophils Relative: 1 %
HCT: 22.1 % — ABNORMAL LOW (ref 39.0–52.0)
Hemoglobin: 7.4 g/dL — ABNORMAL LOW (ref 13.0–17.0)
Immature Granulocytes: 5 %
Lymphocytes Relative: 5 %
Lymphs Abs: 0.1 10*3/uL — ABNORMAL LOW (ref 0.7–4.0)
MCH: 30.2 pg (ref 26.0–34.0)
MCHC: 33.5 g/dL (ref 30.0–36.0)
MCV: 90.2 fL (ref 80.0–100.0)
Monocytes Absolute: 0.2 10*3/uL (ref 0.1–1.0)
Monocytes Relative: 8 %
Neutro Abs: 1.7 10*3/uL (ref 1.7–7.7)
Neutrophils Relative %: 80 %
Platelets: 113 10*3/uL — ABNORMAL LOW (ref 150–400)
RBC: 2.45 MIL/uL — ABNORMAL LOW (ref 4.22–5.81)
RDW: 14.4 % (ref 11.5–15.5)
WBC Morphology: INCREASED
WBC: 2.1 10*3/uL — ABNORMAL LOW (ref 4.0–10.5)
nRBC: 0 % (ref 0.0–0.2)

## 2019-07-10 LAB — GLUCOSE, CAPILLARY
Glucose-Capillary: 163 mg/dL — ABNORMAL HIGH (ref 70–99)
Glucose-Capillary: 181 mg/dL — ABNORMAL HIGH (ref 70–99)
Glucose-Capillary: 188 mg/dL — ABNORMAL HIGH (ref 70–99)
Glucose-Capillary: 213 mg/dL — ABNORMAL HIGH (ref 70–99)
Glucose-Capillary: 227 mg/dL — ABNORMAL HIGH (ref 70–99)
Glucose-Capillary: 251 mg/dL — ABNORMAL HIGH (ref 70–99)

## 2019-07-10 LAB — URINE CULTURE: Culture: NO GROWTH

## 2019-07-10 LAB — HEMOGLOBIN A1C
Hgb A1c MFr Bld: 8 % — ABNORMAL HIGH (ref 4.8–5.6)
Mean Plasma Glucose: 182.9 mg/dL

## 2019-07-10 LAB — MAGNESIUM: Magnesium: 1.7 mg/dL (ref 1.7–2.4)

## 2019-07-10 MED ORDER — PANTOPRAZOLE SODIUM 40 MG IV SOLR
40.0000 mg | Freq: Two times a day (BID) | INTRAVENOUS | Status: DC
  Administered 2019-07-10 – 2019-07-16 (×12): 40 mg via INTRAVENOUS
  Filled 2019-07-10 (×12): qty 40

## 2019-07-10 MED ORDER — HYDROCODONE-HOMATROPINE 5-1.5 MG/5ML PO SYRP
5.0000 mL | ORAL_SOLUTION | Freq: Four times a day (QID) | ORAL | Status: DC
  Administered 2019-07-10 – 2019-07-16 (×16): 5 mL
  Filled 2019-07-10 (×16): qty 5

## 2019-07-10 MED ORDER — MORPHINE SULFATE (PF) 2 MG/ML IV SOLN
2.0000 mg | INTRAVENOUS | Status: DC | PRN
  Administered 2019-07-11 – 2019-07-13 (×2): 2 mg via INTRAVENOUS
  Filled 2019-07-10 (×2): qty 1

## 2019-07-10 MED ORDER — CHLORHEXIDINE GLUCONATE CLOTH 2 % EX PADS
6.0000 | MEDICATED_PAD | Freq: Every day | CUTANEOUS | Status: DC
  Administered 2019-07-10 – 2019-07-16 (×6): 6 via TOPICAL

## 2019-07-10 MED ORDER — ONDANSETRON HCL 4 MG/2ML IJ SOLN
4.0000 mg | Freq: Four times a day (QID) | INTRAMUSCULAR | Status: DC
  Administered 2019-07-10 – 2019-07-16 (×23): 4 mg via INTRAVENOUS
  Filled 2019-07-10 (×23): qty 2

## 2019-07-10 NOTE — Consult Note (Addendum)
Referring Provider: Kathie Dike, MD Primary Care Physician:  Glenda Chroman, MD Primary Gastroenterologist:  Barney Drain, MD  Reason for Consultation:  hematemesis  HPI: Kurt Duran is a 74 y.o. 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  Patient noted increased secretions and coughing up frothy clear secretions for 10 days. Hard to get up sometimes. It is believed the secretions are coming from above the obstructing esophageal tumor. Has to cough to try and clear. When he coughs he has pain in his chest and into his back. Generally feeling weak. Temp of 100.25F at home, presenting to ED yesterday. During ED stay, labs were reassuring and he felt some better and was discharged home. Returned later with temp of 104F, noted to be hypotensive, tachycardic. Lactic acid over 4. Sodium 126. WBC 0.8 (down from 3.7 day before), H/H 8.6/25.4. Platelets 126,000. Absolute neutrophil count 0.7.   CT chest/Abd/pelvis with no source for sepsis. Lower esophageal mass with wall thickening and retain fluid superiorly. No perforation. Percutaneous jejunostomy tube in good position. Liquid stool but no colon wall thickening. He had subcentimeter low-density in upper right liver will need attention to follow up. Cholelithiasis. Covid negative.   Patient showed rapid signs of improvement. Pressors were discontinued within first 24 hours. Urine and blood cultures remain negative. WBC 2100 today. Lactic acid yesterday afternoon down to 2.7.  Today he developed some fresh blood in the secretions that he has been bringing up. Has had some dark brown secretions while at home but never fresh blood. Noted to have about 100 cc so far today. Hgb has dropped some, in part due to dilution and recent chemo. Hgb 9.5 on 3/31 (last chemo). Hgb 8.6 two days ago, 7.4 this morning and  7.2 this afternoon. BUN 14, Creatinine 0.49. He denies melena, brbpr. No abdominal pain.   EGD 05/2019:  medium-sized, ulcerating mass (adenocarcinoma) with bleeding and stigmata of recent bleeding was found in the distal esophagus, 37 to 39 cm from the incisors. The mass was partially obstructing and circumferential. Localized moderate inflammation characterized by congestion (edema), erosions and erythema was found in the entire examined stomach. Biopsies negative for H.pylori. mild duodenitis.     Prior to Admission medications   Medication Sig Start Date End Date Taking? Authorizing Provider  acetaminophen (TYLENOL) 325 MG tablet Place 2 tablets (650 mg total) into feeding tube every 6 (six) hours as needed for mild pain (or Fever >/= 101). 05/16/19  Yes Elgergawy, Silver Huguenin, MD  albuterol (PROVENTIL HFA;VENTOLIN HFA) 108 (90 Base) MCG/ACT inhaler Inhale 2 puffs into the lungs every 4 (four) hours as needed for wheezing or shortness of breath.   Yes [provider]  albuterol (PROVENTIL) (2.5 MG/3ML) 0.083% nebulizer solution Take 2.5 mg by nebulization every 6 (six) hours as needed for wheezing or shortness of breath.   Yes [provider]  ALPRAZolam (XANAX) 0.5 MG tablet 1 tablet (0.5 mg total) by Per J Tube route 2 (two) times daily. 05/16/19  Yes Elgergawy, Silver Huguenin, MD  Amino Acids-Protein Hydrolys (FEEDING SUPPLEMENT, PRO-STAT SUGAR FREE 64,) LIQD 30 mLs by Per J Tube route 2 (two) times daily. 05/16/19  Yes Elgergawy, Silver Huguenin, MD  famotidine (PEPCID) 20 MG tablet Place 1 tablet (20 mg total) into feeding tube daily. 05/16/19  Yes Elgergawy, Silver Huguenin, MD  FLUoxetine (PROZAC) 20 MG capsule Place 1 capsule (20 mg total) into  feeding tube daily. 05/16/19  Yes Elgergawy, Silver Huguenin, MD  glipiZIDE (GLUCOTROL) 10 MG tablet Place 1 tablet (10 mg total) into feeding tube 2 (two) times daily before a meal. Patient taking differently: Place 5-10 mg into feeding tube 2 (two) times daily before a  meal. Take 10 mg in the morning and 5 mg in the evening 05/16/19  Yes Elgergawy, Silver Huguenin, MD  lidocaine-prilocaine (EMLA) cream Apply a pea-sized amount to port a cath site and cover with plastic wrap one hour prior to chemotherapy appointments 06/07/19  Yes Derek Jack, MD  metFORMIN (GLUCOPHAGE) 1000 MG tablet 1 tablet (1,000 mg total) by Per J Tube route 2 (two) times daily. 05/16/19  Yes Elgergawy, Silver Huguenin, MD  Nutritional Supplements (FEEDING SUPPLEMENT, OSMOLITE 1.5 CAL,) LIQD Place 1,000 mLs into feeding tube continuous. 05/16/19  Yes Elgergawy, Silver Huguenin, MD  ondansetron (ZOFRAN-ODT) 4 MG disintegrating tablet Take 4 mg by mouth every 6 (six) hours as needed. 06/03/19  Yes [provider]  PACLITAXEL IV Inject 50 mg/m2 into the vein once a week.  06/15/19  Yes [provider]  pantoprazole sodium (PROTONIX) 40 mg/20 mL PACK Place 20 mLs (40 mg total) into feeding tube daily. 05/16/19  Yes Elgergawy, Silver Huguenin, MD  prochlorperazine (COMPAZINE) 10 MG tablet Take 1 tablet (10 mg total) by mouth every 6 (six) hours as needed (Nausea or vomiting). 06/07/19  Yes Derek Jack, MD  terazosin (HYTRIN) 10 MG capsule 1 capsule (10 mg total) by Per J Tube route daily. 05/16/19  Yes Elgergawy, Silver Huguenin, MD  Water For Irrigation, Sterile (FREE WATER) SOLN Place 300 mLs into feeding tube every 8 (eight) hours. 05/16/19  Yes Elgergawy, Silver Huguenin, MD  atorvastatin (LIPITOR) 40 MG tablet 1 tablet (40 mg total) by Per J Tube route every evening. for cholesterol Patient not taking: Reported on 06/29/2019 05/16/19   Elgergawy, Silver Huguenin, MD  CARBOPLATIN IV Inject into the vein once a week. 06/15/19   [provider]  promethazine (PHENERGAN) 25 MG suppository Place 25 mg rectally every 6 (six) hours as needed. 06/03/19   [provider]  scopolamine (TRANSDERM-SCOP) 1 MG/3DAYS Place 1 patch (1.5 mg total) onto the skin every 3 (three) days. Patient not taking: Reported on 07/06/2019  06/29/19   Derek Jack, MD    Current Facility-Administered Medications  Medication Dose Route Frequency Provider Last Rate Last Admin  . 0.9 %  sodium chloride infusion  1,000 mL Intravenous Continuous Kathie Dike, MD 75 mL/hr at 07/09/19 1720 IV Pump [employer] at 07/09/19 1720  . acetaminophen (TYLENOL) tablet 650 mg  650 mg Oral Q6H PRN Kathie Dike, MD   650 mg at 07/10/19 [id number]   Or  . acetaminophen (TYLENOL) suppository 650 mg  650 mg Rectal Q6H PRN Kathie Dike, MD   650 mg at 07/09/19 1839  . albuterol (PROVENTIL) (2.5 MG/3ML) 0.083% nebulizer solution 2.5 mg  2.5 mg Nebulization Q6H PRN Kathie Dike, MD      . ALPRAZolam Duanne Moron) tablet 0.5 mg  0.5 mg Per J Tube BID Kathie Dike, MD   0.5 mg at 07/10/19 0924  . ceFEPIme (MAXIPIME) 2 g in sodium chloride 0.9 % 100 mL IVPB  2 g Intravenous Q8H Memon, Jolaine Artist, MD 200 mL/hr at 07/10/19 1529 2 g at 07/10/19 1529  . Chlorhexidine Gluconate Cloth 2 % PADS 6 each  6 each Topical Daily Kathie Dike, MD   6 each at 07/10/19 0925  . famotidine (PEPCID) tablet 20 mg  20 mg Per Tube Daily Kathie Dike, MD   20 mg at 07/10/19 0924  . feeding supplement (PRO-STAT SUGAR FREE 64) liquid 30 mL  30 mL Per J Tube BID Kathie Dike, MD   30 mL at 07/10/19 0925  . FLUoxetine (PROZAC) capsule 20 mg  20 mg Per Tube Daily Kathie Dike, MD   20 mg at 07/10/19 0925  . free water 300 mL  300 mL Per Tube Q8H Kathie Dike, MD   300 mL at 07/10/19 1319  . HYDROcodone-homatropine (HYCODAN) 5-1.5 MG/5ML syrup 5 mL  5 mL Per Tube Q6H Kathie Dike, MD   5 mL at 07/10/19 1429  . insulin aspart (novoLOG) injection 0-15 Units  0-15 Units Subcutaneous Q4H Kathie Dike, MD   3 Units at 07/10/19 1558  . morphine 2 MG/ML injection 2 mg  2 mg Intravenous Q4H PRN Kathie Dike, MD      . norepinephrine (LEVOPHED) 4mg  in 287mL premix infusion  0-40 mcg/min Intravenous Titrated Kathie Dike, MD   Stopped at 07/09/19 1530  .  ondansetron (ZOFRAN) tablet 4 mg  4 mg Oral Q6H PRN Kathie Dike, MD       Or  . ondansetron (ZOFRAN) injection 4 mg  4 mg Intravenous Q6H PRN Kathie Dike, MD   4 mg at 07/10/19 1318  . ondansetron (ZOFRAN) injection 4 mg  4 mg Intravenous Q6H Memon, Jolaine Artist, MD      . pantoprazole (PROTONIX) injection 40 mg  40 mg Intravenous Q12H Kathie Dike, MD        Allergies as of 07/09/2019 - Review Complete 07/09/2019  Allergen Reaction Noted  . Eggs or egg-derived products Nausea And Vomiting 03/24/2016    Past Medical History:  Diagnosis Date  . Anxiety   . Arthritis   . COPD (chronic obstructive pulmonary disease) ([place])   . Diabetes mellitus without complication ([place])   . Diabetic retinopathy ([place])    NPDR OU  . Esophageal cancer ([place])   . GERD (gastroesophageal reflux disease)   . Gout   . Hypertension   . Hypertensive retinopathy    OU  . Port-A-Cath in place 06/07/2019    Past Surgical History:  Procedure Laterality Date  . BIOPSY  05/10/2019   Procedure: BIOPSY;  Surgeon: Danie Binder, MD;  Location: [facility];  Service: Endoscopy;;  gastric esophageal  . CATARACT EXTRACTION W/PHACO Left 04/03/2016   Procedure: CATARACT EXTRACTION PHACO AND INTRAOCULAR LENS PLACEMENT LEFT EYE CDE= 11.33;  Surgeon: Tonny Branch, MD;  Location: [facility];  Service: Ophthalmology;  Laterality: Left;  left  . CATARACT EXTRACTION W/PHACO Right 04/28/2016   Procedure: CATARACT EXTRACTION PHACO AND INTRAOCULAR LENS PLACEMENT (IOC);  Surgeon: Tonny Branch, MD;  Location: [facility];  Service: Ophthalmology;  Laterality: Right;  [id number]   . ESOPHAGOGASTRODUODENOSCOPY (EGD) WITH PROPOFOL N/A 05/10/2019   Fields: medium sized ulcerating mass with bleeding found in distal esophagus, partially obstructing and circumferential (adenocarcinoma). Gastritis (benign bx) and duodenitis  . EYE SURGERY Bilateral    Cat Sx OU  . JEJUNOSTOMY N/A 05/13/2019   Procedure: JEJUNOSTOMY FEEDING TUBE PLACEMENT (Procedure  #2);  Surgeon: Aviva Signs, MD;  Location: [facility];  Service: General;  Laterality: N/A;  . KNEE ARTHROSCOPY Right   . NECK SURGERY    . PORTACATH PLACEMENT Right 05/13/2019   Procedure: INSERTION PORT-A-CATH (attached catheter in right subclavian) (Procedure #1);  Surgeon: Aviva Signs, MD;  Location: [facility];  Service: General;  Laterality: Right;  . ROTATOR CUFF  REPAIR Right     Family History  Adopted: Yes  Problem Relation Age of Onset  . Hypertension Brother   . Kidney disease Brother     Social History   Socioeconomic History  . Marital status: Married    Spouse name: Not on file  . Number of children: 0  . Years of education: Not on file  . Highest education level: Not on file  Occupational History  . Occupation: retired  Tobacco Use  . Smoking status: Former Smoker    Types: Cigarettes    Quit date: 03/26/1989    Years since quitting: 30.3  . Smokeless tobacco: Never Used  Substance and Sexual Activity  . Alcohol use: No  . Drug use: No  . Sexual activity: Not Currently  Other Topics Concern  . Not on file  Social History Narrative  . Not on file   Social Determinants of Health   Financial Resource Strain: Low Risk   . Difficulty of Paying Living Expenses: Not hard at all  Food Insecurity: No Food Insecurity  . Worried About Charity fundraiser in the Last Year: Never true  . Ran Out of Food in the Last Year: Never true  Transportation Needs: No Transportation Needs  . Lack of Transportation (Medical): No  . Lack of Transportation (Non-Medical): No  Physical Activity: Inactive  . Days of Exercise per Week: 0 days  . Minutes of Exercise per Session: 0 min  Stress: No Stress Concern Present  . Feeling of Stress : Only a little  Social Connections: Not Isolated  . Frequency of Communication with Friends and Family: Three times a week  . Frequency of Social Gatherings with Friends and Family: Three times a week  . Attends Religious Services: More than 4  times per year  . Active Member of Clubs or Organizations: No  . Attends Archivist Meetings: More than 4 times per year  . Marital Status: Married  Human resources officer Violence: Not At Risk  . Fear of Current or Ex-Partner: No  . Emotionally Abused: No  . Physically Abused: No  . Sexually Abused: No     ROS:  General: see hpi Eyes: Negative for vision changes.  ENT: Negative for hoarseness,  nasal congestion. CV: Negative for chest pain, angina, palpitations, dyspnea on exertion, peripheral edema.  Respiratory: Negative for dyspnea at rest, dyspnea on exertion, +cough, sputum, wheezing. See hpi GI: See history of present illness. GU:  Negative for dysuria, hematuria, urinary incontinence, urinary frequency, nocturnal urination.  MS: Negative for joint pain, low back pain.  Derm: Negative for rash or itching.  Neuro: Negative for weakness, abnormal sensation, seizure, frequent headaches, memory loss, confusion.  Psych: Negative for anxiety, depression, suicidal ideation, hallucinations.  Endo:+weight loss due to esophageal cancer Heme: Negative for bruising or bleeding. Allergy: Negative for rash or hives.       Physical Examination: Vital signs in last 24 hours: Temp:  [98 F (36.7 C)-98.9 F (37.2 C)] 98 F (36.7 C) (04/04 1253) Pulse Rate:  [94-109] 109 (04/04 1253) Resp:  [14-28] 16 (04/04 1253) BP: (90-175)/(44-158) 114/65 (04/04 1253) SpO2:  [87 %-99 %] 99 % (04/04 1253) Weight:  [93.5 kg] 93.5 kg (04/04 0500) Last BM Date: 07/09/19  General: Well-nourished, well-developed in no acute distress.  Head: Normocephalic, atraumatic.   Eyes: Conjunctiva pink, no icterus. Mouth: Oropharyngeal mucosa moist and pink. Coughs to clear secretions which are now clear during examination Neck: Supple without thyromegaly, masses, or lymphadenopathy.  Lungs: Clear to auscultation bilaterally.  Heart: Regular rate and rhythm, no murmurs rubs or gallops.  Abdomen: Bowel  sounds are normal, nontender, nondistended, no hepatosplenomegaly or masses, no abdominal bruits or    hernia , no rebound or guarding.  Feeding tube in left upper abdomen. Site without significant erythema or drainage Rectal: not performed Extremities: trace bilateral upper and lower extremity edema. no clubbing, deformity.  Neuro: Alert and oriented x 4 , grossly normal neurologically.  Skin: Warm and dry, no rash or jaundice.   Psych: Alert and cooperative, normal mood and affect.        Intake/Output from previous day: 04/03 0701 - 04/04 0700 In: 2920 [I.V.:1100; NG/GT:1620; IV Piggyback:200] Out: 300 [Urine:300] Intake/Output this shift: Total I/O In: 2497.1 [I.V.:666; NG/GT:1731; IV Piggyback:100.1] Out: -   Lab Results: CBC Recent Labs    07/08/19 0317 07/08/19 0317 07/09/19 0844 07/10/19 0431 07/10/19 1400  WBC 3.7*  --  0.8* 2.1*  --   HGB 8.6*   < > 8.6* 7.4* 7.2*  HCT 25.5*   < > 25.4* 22.1* 21.7*  MCV 88.9  --  89.4 90.2  --   PLT 120*  --  126* 113*  --    < > = values in this interval not displayed.   BMET Recent Labs    07/08/19 0317 07/09/19 0844 07/10/19 0431  NA 129* 126* 130*  K 4.3 3.7 3.9  CL 90* 90* 96*  CO2 25 23 27   GLUCOSE 196* 267* 263*  BUN 13 14 14   CREATININE 0.52* 0.61 0.49*  CALCIUM 7.6* 7.8* 7.7*   LFT Recent Labs    07/08/19 0317 07/09/19 0844 07/10/19 0431  BILITOT 0.4 0.6 0.3  ALKPHOS 85 111 90  AST 20 28 15   ALT 23 24 18   PROT 5.8* 5.6* 4.9*  ALBUMIN 2.5* 2.4* 2.1*    Lipase No results for input(s): LIPASE in the last 72 hours.  PT/INR Recent Labs    07/09/19 0844  LABPROT 15.1  INR 1.2      Imaging Studies: CT Chest W Contrast  Result Date: 07/09/2019 CLINICAL DATA:  Fever and history of esophageal cancer.  Sepsis. EXAM: CT CHEST, ABDOMEN, AND PELVIS WITH CONTRAST TECHNIQUE: Multidetector CT imaging of the chest, abdomen and pelvis was performed following the standard protocol during bolus administration  of intravenous contrast. CONTRAST:  120mL OMNIPAQUE IOHEXOL 300 MG/ML  SOLN COMPARISON:  05/09/2019 FINDINGS: CT CHEST FINDINGS Cardiovascular: Normal heart size. No pericardial effusion. Extensive coronary atherosclerotic calcification. Porta catheter with tip at the distal SVC. Mediastinum/Nodes: Lower esophageal mass with wall thickening and retain fluid superiorly. There is indistinct fat at the GE junction with periesophageal nodularity likely reflecting tumor. No noted perforation. Small bilateral thyroid nodules measuring 1 cm or less. No followup recommended (ref: J Am Coll Radiol. 2015 Feb;12(2): 143-50). Lungs/Pleura: Small left pleural effusion without nodularity. Left lower lobe atelectasis. Musculoskeletal: Spondylosis. Subcutaneous lipoma partially covered posterior to the right shoulder. CT ABDOMEN PELVIS FINDINGS Hepatobiliary: Vague 4 mm low-density in the subcapsular high right lobe liver.Sludge or noncalcified gallstones. The gallbladder is full but there is no acute cholecystitis. Pancreas: Unremarkable. Spleen: Unremarkable. Adrenals/Urinary Tract: Small left adrenal myelolipoma. No hydronephrosis or stone. Unremarkable bladder. Stomach/Bowel: Fluid levels reach the distal colon. No bowel wall thickening. Percutaneous jejunostomy tube in good position. Negative for bowel obstruction. Vascular/Lymphatic: No acute vascular abnormality. Diffuse atherosclerotic calcification. Mildly enlarged lymph nodes about the GE junction. Reproductive:No acute finding Other: No ascites or pneumoperitoneum.  Musculoskeletal: Extensive spondylosis. No bone metastases by recent PET-CT. IMPRESSION: 1. No specific cause of sepsis. 2. There is liquid stool but no colitic wall thickening. 3. Small left pleural effusion with atelectasis. 4. Known obstructive lower esophageal mass. Subcentimeter low-density in the upper right liver, attention on follow-up. 5. Cholelithiasis. Electronically Signed   By: Monte Fantasia  M.D.   On: 07/09/2019 10:27   CT ABDOMEN PELVIS W CONTRAST  Result Date: 07/09/2019 CLINICAL DATA:  Fever and history of esophageal cancer.  Sepsis. EXAM: CT CHEST, ABDOMEN, AND PELVIS WITH CONTRAST TECHNIQUE: Multidetector CT imaging of the chest, abdomen and pelvis was performed following the standard protocol during bolus administration of intravenous contrast. CONTRAST:  115mL OMNIPAQUE IOHEXOL 300 MG/ML  SOLN COMPARISON:  05/09/2019 FINDINGS: CT CHEST FINDINGS Cardiovascular: Normal heart size. No pericardial effusion. Extensive coronary atherosclerotic calcification. Porta catheter with tip at the distal SVC. Mediastinum/Nodes: Lower esophageal mass with wall thickening and retain fluid superiorly. There is indistinct fat at the GE junction with periesophageal nodularity likely reflecting tumor. No noted perforation. Small bilateral thyroid nodules measuring 1 cm or less. No followup recommended (ref: J Am Coll Radiol. 2015 Feb;12(2): 143-50). Lungs/Pleura: Small left pleural effusion without nodularity. Left lower lobe atelectasis. Musculoskeletal: Spondylosis. Subcutaneous lipoma partially covered posterior to the right shoulder. CT ABDOMEN PELVIS FINDINGS Hepatobiliary: Vague 4 mm low-density in the subcapsular high right lobe liver.Sludge or noncalcified gallstones. The gallbladder is full but there is no acute cholecystitis. Pancreas: Unremarkable. Spleen: Unremarkable. Adrenals/Urinary Tract: Small left adrenal myelolipoma. No hydronephrosis or stone. Unremarkable bladder. Stomach/Bowel: Fluid levels reach the distal colon. No bowel wall thickening. Percutaneous jejunostomy tube in good position. Negative for bowel obstruction. Vascular/Lymphatic: No acute vascular abnormality. Diffuse atherosclerotic calcification. Mildly enlarged lymph nodes about the GE junction. Reproductive:No acute finding Other: No ascites or pneumoperitoneum. Musculoskeletal: Extensive spondylosis. No bone metastases by recent  PET-CT. IMPRESSION: 1. No specific cause of sepsis. 2. There is liquid stool but no colitic wall thickening. 3. Small left pleural effusion with atelectasis. 4. Known obstructive lower esophageal mass. Subcentimeter low-density in the upper right liver, attention on follow-up. 5. Cholelithiasis. Electronically Signed   By: Monte Fantasia M.D.   On: 07/09/2019 10:27   DG Chest Port 1 View  Result Date: 07/09/2019 CLINICAL DATA:  Fever.  History of esophageal carcinoma EXAM: PORTABLE CHEST 1 VIEW COMPARISON:  July 08, 2019 FINDINGS: Port-A-Cath tip is in the superior vena cava. No pneumothorax. There is a small left pleural effusion. Lungs elsewhere are clear. Heart size and pulmonary vascularity are normal. No appreciable mediastinal widening. There is aortic atherosclerosis. No evident adenopathy. Postoperative change noted in lower cervical spine. Postoperative change also noted in right shoulder. IMPRESSION: Small left pleural effusion. Lungs elsewhere clear. Stable cardiac silhouette. Aortic Atherosclerosis (ICD10-I70.0). Port-A-Cath tip in superior vena cava.  No pneumothorax. Electronically Signed   By: Lowella Grip III M.D.   On: 07/09/2019 09:05   DG Chest Port 1 View  Result Date: 07/08/2019 CLINICAL DATA:  Cough and shortness of breath EXAM: PORTABLE CHEST 1 VIEW COMPARISON:  None. FINDINGS: The heart size and mediastinal contours are within normal limits. Both lungs are clear. The visualized skeletal structures are unremarkable. There is a right chest wall power-injectable Port-A-Cath with tip in the lower SVC via a right internal jugular vein approach. IMPRESSION: No active disease. Electronically Signed   By: Ulyses Jarred M.D.   On: 07/08/2019 03:46  [4 week]   Impression: Pleasant 74 y/o male with recent  diagnosis of adenocarcinoma of the distal esophagus currently undergoing chemo and radiation treatments prior to consideration of surgery. He has completed four cycles of chemo (last  one 07/06/19) and about 1/2 way through planned 25 XRT treatments. Presented to ED with fever, worsening cough and weakness. Patient reports worsening urge to cough to clear secretions which he believes is coming from his esophagus. This is associated with chest and back pain when he coughs. In the ED he was noted to be hypotensive/tachycardic, lactic acid elevated. Concern for sepsis. Chest/abd/pelvic CT without explanation for sepsis. Urine and blood cultures remain negative. His WBC is improved today. He has been weaned off pressors and moved out of the ICU today. Today it was noted that he was coughing up bloody secretions, new finding. He described dark secretions at home but no fresh blood. He has had about 100cc noted in the canister so far. Since this occurred, his secretions are clear again. His Hgb stable today, overall some decline since admission, likely due to hemodilution and chemo affect.   I suspect he is having bleeding from his esophageal tumor and/or radiation induce friability. His cough is likely due to pooling of secretions in his obstructed esophagus. There has not been any melena or blood noted in jejunostomy tube. Patient denies heaving. He is on PPI QD, Pepcid QD.   Plan: 1. Monitor H/H closely, transfuse if needed.  2. Protonix IV BID.  3. Antiemetics to help prevent heaving.  4. At this time would not advise upper endoscopy for bleeding. discussed with Dr. Gala Romney and Dr. Roderic Palau. He may or may not qualify for temporary esophageal stent due to his inability to swallow secretions. We will touch base with Dr. Rush Landmark tomorrow.  5. He needs to keep head of bed at 45 degrees at all time to help secretions trickle downward and reduce aspiration risks.  6. He should take nothing by mouth. 7. Agree with holding heparin.  We would like to thank you for the opportunity to participate in the care of Mannix T Abbruzzese.   Laureen Ochs. Bernarda Caffey [facility] [phone] 4/4/20217:00 PM          LOS: 1 day

## 2019-07-10 NOTE — Progress Notes (Signed)
PROGRESS NOTE    Kurt Duran  [id number] DOB: 09-08-1945 DOA: 07/09/2019 PCP: Glenda Chroman, MD    Brief Narrative:   Kurt Duran is a 74 y.o. male with medical history significant of esophageal cancer who is receiving chemotherapy as well as radiation treatments.  Patient has an obstructive lesion and is PEG tube for nutrition/meds.  His last chemotherapy was on 3/31.  Reports of the past 10 days, he has had increasing secretions and cough.  He was prescribed scopolamine patch which caused dry mouth, but did not improve his respiratory secretions.  He has been feeling generally weak and unwell.  He came to the ER yesterday wife reports that his temperature was 100.66F prior to the visit.  During his ER stay, his labs were reassuring and he felt better and therefore discharged home.  Upon returning home, he had worsening fever this morning developed chills.  He was brought back to the emergency room where he was found to have a temperature of 104F.  Is also noted to be hypotensive and tachycardic.  Lactic acid markedly elevated over 4.  He was started on IV fluids per sepsis protocol.  CT of the abdomen pelvis as well as chest did not show any source of sepsis.  Coronavirus test was also negative.  He was noted to be neutropenic.  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 Severe sepsis with septic shock.  Patient was noted to be hypotensive, febrile and tachycardic on arrival.  He was also neutropenic.  Blood cultures have not shown any growth.  He received IV  fluid boluses per sepsis protocol.  Despite receiving 3 to 4 L of fluid in the ED, he remained hypotensive.  Started on low-dose norepinephrine infusion through his IV port.    Lactic acid trended down.  He was able to wean off of vasopressors. 2. Febrile neutropenia.  No clear source of infection at this time, although we need to repeat chest imaging tomorrow once he is better hydrated.  He has been started on cefepime.  Cultures have been sent. 3. Hyponatremia.  Related to volume depletion.    Improving with IV hydration. 4. Pancytopenia.  Likely related to recent chemotherapy.  Continue to treat supportively and monitor. 5. Diabetes.  Currently on glipizide.  Will hold for now and start on sliding scale insulin. 6. Esophageal adenocarcinoma.  He is followed by oncology for chemotherapy and radiation treatments.  Plans are to follow-up with cardiothoracic surgery as an outpatient to consider resection at some point.  Continue PEG tube feedings 7. GERD.  Continue on PPI and H2 blockers.   DVT prophylaxis: Heparin Code Status: DNR Family Communication: Discussed with wife at the bedside Disposition Plan: Patient is from home.  Plan will be to discharge home the next 24 to 48 hours.  Barriers to discharge at this time are continue treatment with IV antibiotics for sepsis/febrile neutropenia and pending cultures   Consultants:     Procedures:     Antimicrobials:   Cefepime 4/3>  Subjective: No shortness of breath, continues to have a hacking cough, no vomiting  Objective: Vitals:   07/10/19 1205 07/10/19 1215 07/10/19 1253 07/10/19 1700  BP:   114/65 113/65  Pulse: (!) 106 (!) 104 (!) 109 98  Resp: 20 16 16 20   Temp:   98 F (36.7 C) 99.5 F (37.5 C)  TempSrc:   Oral Oral  SpO2: 96% 94% 99% 94%  Weight:      Height:        Intake/Output Summary (Last 24 hours) at 07/10/2019 1920 Last data filed at 07/10/2019 1839 Gross per 24 hour  Intake 4547.07 ml  Output 600 ml  Net  3947.07 ml   Filed Weights   07/09/19 0829 07/09/19 1414 07/10/19 0500  Weight: 89 kg 93.7 kg 93.5 kg    Examination:  General exam: Appears calm and comfortable  Respiratory system: Clear to auscultation. Respiratory effort normal. Cardiovascular system: S1 & S2 heard, RRR. No JVD, murmurs, rubs, gallops or clicks. No pedal edema. Gastrointestinal system: Abdomen is nondistended, soft and nontender. No organomegaly or masses felt. Normal bowel sounds heard. Central nervous system: Alert and oriented. No focal neurological deficits. Extremities: Symmetric 5 x 5 power. Skin: No rashes, lesions or ulcers Psychiatry: Judgement and insight appear normal. Mood & affect appropriate.     Data Reviewed: I have personally reviewed following labs and imaging studies  CBC: Recent Labs  Lab 07/06/19 0852 07/08/19 0317 07/09/19 0844 07/10/19 0431 07/10/19 1400  WBC 3.2* 3.7* 0.8* 2.1*  --   NEUTROABS 2.7 3.3 0.7* 1.7  --   HGB 9.5* 8.6* 8.6* 7.4* 7.2*  HCT 27.7* 25.5* 25.4* 22.1* 21.7*  MCV 89.1 88.9 89.4 90.2  --   PLT 142* 120* 126* 113*  --    Basic Metabolic Panel: Recent Labs  Lab 07/06/19 0852 07/08/19 0317 07/09/19 0844 07/10/19 0431  NA 129* 129* 126* 130*  K 3.9 4.3 3.7 3.9  CL 90* 90* 90* 96*  CO2 27 25 23 27   GLUCOSE 178* 196* 267* 263*  BUN 11 13 14 14   CREATININE 0.51* 0.52* 0.61 0.49*  CALCIUM 8.2* 7.6* 7.8* 7.7*  MG 1.6*  --  1.6* 1.7   GFR: Estimated Creatinine Clearance: 95.7 mL/min (A) (by C-G formula based on SCr of 0.49 mg/dL (L)). Liver Function Tests: Recent Labs  Lab 07/06/19 0852 07/08/19 0317 07/09/19 0844 07/10/19 0431  AST 21 20 28 15   ALT 27 23 24 18   ALKPHOS 88 85 111 90  BILITOT 0.4 0.4 0.6 0.3  PROT 5.8* 5.8* 5.6* 4.9*  ALBUMIN 2.6* 2.5* 2.4* 2.1*   No results for input(s): LIPASE, AMYLASE in the last 168 hours. No results for input(s): AMMONIA in the last 168 hours. Coagulation Profile: Recent Labs  Lab 07/09/19 0844  INR  1.2   Cardiac Enzymes: No results for input(s): CKTOTAL, CKMB, CKMBINDEX, TROPONINI in the last 168 hours. BNP (last 3 results) No results for input(s): PROBNP in the last 8760 hours. HbA1C: No results for input(s): HGBA1C in the last 72 hours. CBG: Recent Labs  Lab 07/10/19 0029 07/10/19 0506 07/10/19 0809 07/10/19 1204 07/10/19 1554  GLUCAP 227* 251* 213* 188* 181*   Lipid Profile: No results for input(s): CHOL, HDL, LDLCALC, TRIG, CHOLHDL, LDLDIRECT in the last 72 hours. Thyroid Function Tests: No results for input(s): TSH, T4TOTAL, FREET4, T3FREE, THYROIDAB in the last 72 hours. Anemia Panel: No results for input(s): VITAMINB12, FOLATE, FERRITIN, TIBC, IRON, RETICCTPCT in the last 72 hours. Sepsis  Labs: Recent Labs  Lab 07/09/19 0844 07/09/19 1052 07/09/19 1322 07/09/19 1602  LATICACIDVEN 4.6* 5.0* 3.3* 2.7*    Recent Results (from the past 240 hour(s))  Blood Culture (routine x 2)     Status: None (Preliminary result)   Collection Time: 07/09/19  8:44 AM   Specimen: BLOOD RIGHT FOREARM  Result Value Ref Range Status   Specimen Description   Final    BLOOD RIGHT FOREARM BOTTLES DRAWN AEROBIC AND ANAEROBIC   Special Requests Blood Culture adequate volume  Final   Culture   Final    NO GROWTH 1 DAY Performed at [facility], [address], [place], [place] [address]    Report Status PENDING  Incomplete  Blood Culture (routine x 2)     Status: None (Preliminary result)   Collection Time: 07/09/19  8:44 AM   Specimen: Right Antecubital; Blood  Result Value Ref Range Status   Specimen Description   Final    RIGHT ANTECUBITAL BOTTLES DRAWN AEROBIC AND ANAEROBIC   Special Requests Blood Culture adequate volume  Final   Culture   Final    NO GROWTH 1 DAY Performed at [facility], [address] [place], [place] [address]    Report Status PENDING  Incomplete  Urine culture     Status: None   Collection Time: 07/09/19  8:50 AM   Specimen: Urine, Catheterized    Result Value Ref Range Status   Specimen Description   Final    URINE, CATHETERIZED Performed at [facility], [address] [place], [place] [address]    Special Requests   Final    NONE Performed at [facility], [address], [place], [place] [address]    Culture   Final    NO GROWTH Performed at [facility], [place] [address] [place], [place] [address]    Report Status 07/10/2019 FINAL  Final  SARS CORONAVIRUS 2 (TAT 6-24 HRS) Nasopharyngeal Nasopharyngeal Swab     Status: None   Collection Time: 07/09/19  9:08 AM   Specimen: Nasopharyngeal Swab  Result Value Ref Range Status   SARS Coronavirus 2 NEGATIVE NEGATIVE Final    Comment: (NOTE) SARS-CoV-2 target nucleic acids are NOT DETECTED. The SARS-CoV-2 RNA is generally detectable in upper and lower respiratory specimens during the acute phase of infection. Negative results do not preclude SARS-CoV-2 infection, do not rule out co-infections with other pathogens, and should not be used as the sole basis for treatment or other patient management decisions. Negative results must be combined with clinical observations, patient history, and epidemiological information. The expected result is Negative. Fact Sheet for Patients: SugarRoll.be Fact Sheet for Healthcare Providers: https://www.woods-mathews.com/ This test is not yet approved or cleared by the [place] FDA and  has been authorized for detection and/or diagnosis of SARS-CoV-2 by FDA under an Emergency Use Authorization (EUA). This EUA will remain  in effect (meaning this test can be used) for the duration of the COVID-19 declaration under Section 56 4(b)(1) of the Act, 21 U.S.C. section 360bbb-3(b)(1), unless the authorization is terminated or revoked sooner. Performed at [facility], [place] [address] [place], [place] [address]   Respiratory Panel by RT PCR (Flu A&B, Covid) - Nasopharyngeal Swab     Status:  None   Collection Time: 07/09/19 11:45 AM   Specimen: Nasopharyngeal Swab  Result Value Ref Range Status   SARS Coronavirus 2 by RT PCR NEGATIVE NEGATIVE Final    Comment: (NOTE) SARS-CoV-2 target nucleic  acids are NOT DETECTED. The SARS-CoV-2 RNA is generally detectable in upper respiratoy specimens during the acute phase of infection. The lowest concentration of SARS-CoV-2 viral copies this assay can detect is 131 copies/mL. A negative result does not preclude SARS-Cov-2 infection and should not be used as the sole basis for treatment or other patient management decisions. A negative result may occur with  improper specimen collection/handling, submission of specimen other than nasopharyngeal swab, presence of viral mutation(s) within the areas targeted by this assay, and inadequate number of viral copies (<131 copies/mL). A negative result must be combined with clinical observations, patient history, and epidemiological information. The expected result is Negative. Fact Sheet for Patients:  PinkCheek.be Fact Sheet for Healthcare Providers:  GravelBags.it This test is not yet ap proved or cleared by the [place] FDA and  has been authorized for detection and/or diagnosis of SARS-CoV-2 by FDA under an Emergency Use Authorization (EUA). This EUA will remain  in effect (meaning this test can be used) for the duration of the COVID-19 declaration under Section 564(b)(1) of the Act, 21 U.S.C. section 360bbb-3(b)(1), unless the authorization is terminated or revoked sooner.    Influenza A by PCR NEGATIVE NEGATIVE Final   Influenza B by PCR NEGATIVE NEGATIVE Final    Comment: (NOTE) The Xpert Xpress SARS-CoV-2/FLU/RSV assay is intended as an aid in  the diagnosis of influenza from Nasopharyngeal swab specimens and  should not be used as a sole basis for treatment. Nasal washings and  aspirates are unacceptable for Xpert  Xpress SARS-CoV-2/FLU/RSV  testing. Fact Sheet for Patients: PinkCheek.be Fact Sheet for Healthcare Providers: GravelBags.it This test is not yet approved or cleared by the [place] FDA and  has been authorized for detection and/or diagnosis of SARS-CoV-2 by  FDA under an Emergency Use Authorization (EUA). This EUA will remain  in effect (meaning this test can be used) for the duration of the  Covid-19 declaration under Section 564(b)(1) of the Act, 21  U.S.C. section 360bbb-3(b)(1), unless the authorization is  terminated or revoked. Performed at [facility], [address] [place], [place] [address]   MRSA PCR Screening     Status: None   Collection Time: 07/09/19  3:09 PM   Specimen: Nasal Mucosa; Nasopharyngeal  Result Value Ref Range Status   MRSA by PCR NEGATIVE NEGATIVE Final    Comment:        The GeneXpert MRSA Assay (FDA approved for NASAL specimens only), is one component of a comprehensive MRSA colonization surveillance program. It is not intended to diagnose MRSA infection nor to guide or monitor treatment for MRSA infections. Performed at [facility], [address], [place], [place] [address]          Radiology Studies: CT Chest W Contrast  Result Date: 07/09/2019 CLINICAL DATA:  Fever and history of esophageal cancer.  Sepsis. EXAM: CT CHEST, ABDOMEN, AND PELVIS WITH CONTRAST TECHNIQUE: Multidetector CT imaging of the chest, abdomen and pelvis was performed following the standard protocol during bolus administration of intravenous contrast. CONTRAST:  159mL OMNIPAQUE IOHEXOL 300 MG/ML  SOLN COMPARISON:  05/09/2019 FINDINGS: CT CHEST FINDINGS Cardiovascular: Normal heart size. No pericardial effusion. Extensive coronary atherosclerotic calcification. Porta catheter with tip at the distal SVC. Mediastinum/Nodes: Lower esophageal mass with wall thickening and retain fluid superiorly. There is  indistinct fat at the GE junction with periesophageal nodularity likely reflecting tumor. No noted perforation. Small bilateral thyroid nodules measuring 1 cm or less. No followup recommended (ref: J Am Coll  Radiol. 2015 Feb;12(2): 143-50). Lungs/Pleura: Small left pleural effusion without nodularity. Left lower lobe atelectasis. Musculoskeletal: Spondylosis. Subcutaneous lipoma partially covered posterior to the right shoulder. CT ABDOMEN PELVIS FINDINGS Hepatobiliary: Vague 4 mm low-density in the subcapsular high right lobe liver.Sludge or noncalcified gallstones. The gallbladder is full but there is no acute cholecystitis. Pancreas: Unremarkable. Spleen: Unremarkable. Adrenals/Urinary Tract: Small left adrenal myelolipoma. No hydronephrosis or stone. Unremarkable bladder. Stomach/Bowel: Fluid levels reach the distal colon. No bowel wall thickening. Percutaneous jejunostomy tube in good position. Negative for bowel obstruction. Vascular/Lymphatic: No acute vascular abnormality. Diffuse atherosclerotic calcification. Mildly enlarged lymph nodes about the GE junction. Reproductive:No acute finding Other: No ascites or pneumoperitoneum. Musculoskeletal: Extensive spondylosis. No bone metastases by recent PET-CT. IMPRESSION: 1. No specific cause of sepsis. 2. There is liquid stool but no colitic wall thickening. 3. Small left pleural effusion with atelectasis. 4. Known obstructive lower esophageal mass. Subcentimeter low-density in the upper right liver, attention on follow-up. 5. Cholelithiasis. Electronically Signed   By: Monte Fantasia M.D.   On: 07/09/2019 10:27   CT ABDOMEN PELVIS W CONTRAST  Result Date: 07/09/2019 CLINICAL DATA:  Fever and history of esophageal cancer.  Sepsis. EXAM: CT CHEST, ABDOMEN, AND PELVIS WITH CONTRAST TECHNIQUE: Multidetector CT imaging of the chest, abdomen and pelvis was performed following the standard protocol during bolus administration of intravenous contrast. CONTRAST:   168mL OMNIPAQUE IOHEXOL 300 MG/ML  SOLN COMPARISON:  05/09/2019 FINDINGS: CT CHEST FINDINGS Cardiovascular: Normal heart size. No pericardial effusion. Extensive coronary atherosclerotic calcification. Porta catheter with tip at the distal SVC. Mediastinum/Nodes: Lower esophageal mass with wall thickening and retain fluid superiorly. There is indistinct fat at the GE junction with periesophageal nodularity likely reflecting tumor. No noted perforation. Small bilateral thyroid nodules measuring 1 cm or less. No followup recommended (ref: J Am Coll Radiol. 2015 Feb;12(2): 143-50). Lungs/Pleura: Small left pleural effusion without nodularity. Left lower lobe atelectasis. Musculoskeletal: Spondylosis. Subcutaneous lipoma partially covered posterior to the right shoulder. CT ABDOMEN PELVIS FINDINGS Hepatobiliary: Vague 4 mm low-density in the subcapsular high right lobe liver.Sludge or noncalcified gallstones. The gallbladder is full but there is no acute cholecystitis. Pancreas: Unremarkable. Spleen: Unremarkable. Adrenals/Urinary Tract: Small left adrenal myelolipoma. No hydronephrosis or stone. Unremarkable bladder. Stomach/Bowel: Fluid levels reach the distal colon. No bowel wall thickening. Percutaneous jejunostomy tube in good position. Negative for bowel obstruction. Vascular/Lymphatic: No acute vascular abnormality. Diffuse atherosclerotic calcification. Mildly enlarged lymph nodes about the GE junction. Reproductive:No acute finding Other: No ascites or pneumoperitoneum. Musculoskeletal: Extensive spondylosis. No bone metastases by recent PET-CT. IMPRESSION: 1. No specific cause of sepsis. 2. There is liquid stool but no colitic wall thickening. 3. Small left pleural effusion with atelectasis. 4. Known obstructive lower esophageal mass. Subcentimeter low-density in the upper right liver, attention on follow-up. 5. Cholelithiasis. Electronically Signed   By: Monte Fantasia M.D.   On: 07/09/2019 10:27   DG  Chest Port 1 View  Result Date: 07/09/2019 CLINICAL DATA:  Fever.  History of esophageal carcinoma EXAM: PORTABLE CHEST 1 VIEW COMPARISON:  July 08, 2019 FINDINGS: Port-A-Cath tip is in the superior vena cava. No pneumothorax. There is a small left pleural effusion. Lungs elsewhere are clear. Heart size and pulmonary vascularity are normal. No appreciable mediastinal widening. There is aortic atherosclerosis. No evident adenopathy. Postoperative change noted in lower cervical spine. Postoperative change also noted in right shoulder. IMPRESSION: Small left pleural effusion. Lungs elsewhere clear. Stable cardiac silhouette. Aortic Atherosclerosis (ICD10-I70.0). Port-A-Cath tip in superior vena cava.  No pneumothorax. Electronically Signed   By: Lowella Grip III M.D.   On: 07/09/2019 09:05        Scheduled Meds: . ALPRAZolam  0.5 mg Per J Tube BID  . Chlorhexidine Gluconate Cloth  6 each Topical Daily  . famotidine  20 mg Per Tube Daily  . feeding supplement (PRO-STAT SUGAR FREE 64)  30 mL Per J Tube BID  . FLUoxetine  20 mg Per Tube Daily  . free water  300 mL Per Tube Q8H  . HYDROcodone-homatropine  5 mL Per Tube Q6H  . insulin aspart  0-15 Units Subcutaneous Q4H  . ondansetron (ZOFRAN) IV  4 mg Intravenous Q6H  . pantoprazole (PROTONIX) IV  40 mg Intravenous Q12H   Continuous Infusions: . sodium chloride 1,000 mL (07/10/19 1839)  . ceFEPime (MAXIPIME) IV 2 g (07/10/19 1529)  . norepinephrine (LEVOPHED) Adult infusion Stopped (07/09/19 1530)     LOS: 1 day    Time spent: 49mins    Kathie Dike, MD [facility]   If 7PM-7AM, please contact night-coverage [email or web address]  07/10/2019, 7:20 PM    Addendum 12:00:  Called by nursing staff that patient had hematemesis. Evaluated patient and noted to have approximately 200cc of bloody secretions. Vitals stable. Patient started on protonix. Repeat H/H stable. Continue to follow hemoglobin. GI consulted.   [employer]

## 2019-07-11 DIAGNOSIS — D709 Neutropenia, unspecified: Secondary | ICD-10-CM

## 2019-07-11 DIAGNOSIS — R5081 Fever presenting with conditions classified elsewhere: Secondary | ICD-10-CM

## 2019-07-11 DIAGNOSIS — A419 Sepsis, unspecified organism: Secondary | ICD-10-CM

## 2019-07-11 DIAGNOSIS — D61818 Other pancytopenia: Secondary | ICD-10-CM

## 2019-07-11 LAB — GLUCOSE, CAPILLARY
Glucose-Capillary: 140 mg/dL — ABNORMAL HIGH (ref 70–99)
Glucose-Capillary: 144 mg/dL — ABNORMAL HIGH (ref 70–99)
Glucose-Capillary: 145 mg/dL — ABNORMAL HIGH (ref 70–99)
Glucose-Capillary: 149 mg/dL — ABNORMAL HIGH (ref 70–99)
Glucose-Capillary: 154 mg/dL — ABNORMAL HIGH (ref 70–99)
Glucose-Capillary: 176 mg/dL — ABNORMAL HIGH (ref 70–99)

## 2019-07-11 LAB — BASIC METABOLIC PANEL
Anion gap: 8 (ref 5–15)
BUN: 10 mg/dL (ref 8–23)
CO2: 27 mmol/L (ref 22–32)
Calcium: 7.7 mg/dL — ABNORMAL LOW (ref 8.9–10.3)
Chloride: 96 mmol/L — ABNORMAL LOW (ref 98–111)
Creatinine, Ser: 0.45 mg/dL — ABNORMAL LOW (ref 0.61–1.24)
GFR calc Af Amer: >60 mL/min (ref >60)
GFR calc non Af Amer: >60 mL/min (ref >60)
Glucose, Bld: 183 mg/dL — ABNORMAL HIGH (ref 70–99)
Potassium: 3.5 mmol/L (ref 3.5–5.1)
Sodium: 131 mmol/L — ABNORMAL LOW (ref 135–145)

## 2019-07-11 LAB — BLOOD CULTURE ID PANEL (REFLEXED)

## 2019-07-11 LAB — CBC
HCT: 21.5 % — ABNORMAL LOW (ref 39.0–52.0)
Hemoglobin: 7.1 g/dL — ABNORMAL LOW (ref 13.0–17.0)
MCH: 29.8 pg (ref 26.0–34.0)
MCHC: 33 g/dL (ref 30.0–36.0)
MCV: 90.3 fL (ref 80.0–100.0)
Platelets: 118 10*3/uL — ABNORMAL LOW (ref 150–400)
RBC: 2.38 MIL/uL — ABNORMAL LOW (ref 4.22–5.81)
RDW: 14.6 % (ref 11.5–15.5)
WBC: 2 10*3/uL — ABNORMAL LOW (ref 4.0–10.5)
nRBC: 0 % (ref 0.0–0.2)

## 2019-07-11 LAB — PREPARE RBC (CROSSMATCH)

## 2019-07-11 LAB — ABO/RH: ABO/RH(D): O POS

## 2019-07-11 MED ORDER — SODIUM CHLORIDE 0.9 % IV SOLN
2.0000 g | INTRAVENOUS | Status: DC
  Administered 2019-07-11 – 2019-07-15 (×5): 2 g via INTRAVENOUS
  Filled 2019-07-11 (×5): qty 20

## 2019-07-11 MED ORDER — SODIUM CHLORIDE 0.9% IV SOLUTION: Freq: Once | INTRAVENOUS | Status: AC

## 2019-07-11 MED ORDER — POTASSIUM CHLORIDE 20 MEQ PO PACK
40.0000 meq | PACK | Freq: Once | ORAL | Status: AC
  Administered 2019-07-13: 40 meq
  Filled 2019-07-11: qty 2

## 2019-07-11 NOTE — Consult Note (Signed)
[facility] Consultation Oncology  Name: Kurt Duran      MRN: [id number]    Location: A303/A303-01  Date: 07/11/2019 Time:5:57 PM   REFERRING PHYSICIAN: Dr. Roderic Palau  REASON FOR CONSULT: GE junction adenocarcinoma   DIAGNOSIS: Sepsis with Streptococcus species  HISTORY OF PRESENT ILLNESS: [name] is a 74 year old very pleasant white male seen in consultation today for further management of GE junction adenocarcinoma, sepsis and neutropenic fever.  He received week 4 of carboplatin and paclitaxel on 07/06/2019.  Over the weekend he developed fever of 104 and presented to the ER.  He was hypotensive and required pressors which were weaned off.  Blood cultures grew Streptococcus species.  Is currently receiving ceftriaxone.  Also produced some blood-tinged sputum since yesterday.  He feels well today.  He is receiving tube feeds.  Still feels weak.  He is actively suctioning off oral secretions.  He was also found to have severe neutropenia which improved.  He is lying in bed and his wife is at bedside.  PAST MEDICAL HISTORY:   Past Medical History:  Diagnosis Date  . Anxiety   . Arthritis   . COPD (chronic obstructive pulmonary disease) ([place])   . Diabetes mellitus without complication ([place])   . Diabetic retinopathy ([place])    NPDR OU  . Esophageal cancer ([place])   . GERD (gastroesophageal reflux disease)   . Gout   . Hypertension   . Hypertensive retinopathy    OU  . Port-A-Cath in place 06/07/2019    ALLERGIES: Allergies  Allergen Reactions  . Eggs Or Egg-Derived Products Nausea And Vomiting      MEDICATIONS: I have reviewed the patient's current medications.     PAST SURGICAL HISTORY Past Surgical History:  Procedure Laterality Date  . BIOPSY  05/10/2019   Procedure: BIOPSY;  Surgeon: Danie Binder, MD;  Location: [facility] END[facility];  Service: Endoscopy;;  gastric esophageal  . CATARACT EXTRACTION W/PHACO Left 04/03/2016   Procedure: CATARACT EXTRACTION PHACO AND  INTRAOCULAR LENS PLACEMENT LEFT EYE CDE= 11.33;  Surgeon: Tonny Branch, MD;  Location: [facility] ORS;  Service: Ophthalmology;  Laterality: Left;  left  . CATARACT EXTRACTION W/PHACO Right 04/28/2016   Procedure: CATARACT EXTRACTION PHACO AND INTRAOCULAR LENS PLACEMENT (IOC);  Surgeon: Tonny Branch, MD;  Location: [facility] ORS;  Service: Ophthalmology;  Laterality: Right;  [id number]   . ESOPHAGOGASTRODUODENOSCOPY (EGD) WITH PROPOFOL N/A 05/10/2019   Fields: medium sized ulcerating mass with bleeding found in distal esophagus, partially obstructing and circumferential (adenocarcinoma). Gastritis (benign bx) and duodenitis  . EYE SURGERY Bilateral    Cat Sx OU  . JEJUNOSTOMY N/A 05/13/2019   Procedure: JEJUNOSTOMY FEEDING TUBE PLACEMENT (Procedure #2);  Surgeon: Aviva Signs, MD;  Location: [facility] ORS;  Service: General;  Laterality: N/A;  . KNEE ARTHROSCOPY Right   . NECK SURGERY    . PORTACATH PLACEMENT Right 05/13/2019   Procedure: INSERTION PORT-A-CATH (attached catheter in right subclavian) (Procedure #1);  Surgeon: Aviva Signs, MD;  Location: [facility] ORS;  Service: General;  Laterality: Right;  . ROTATOR CUFF REPAIR Right     FAMILY HISTORY: Family History  Adopted: Yes  Problem Relation Age of Onset  . Hypertension Brother   . Kidney disease Brother     SOCIAL HISTORY:  reports that he quit smoking about 30 years ago. His smoking use included cigarettes. He has never used smokeless tobacco. He reports that he does not drink alcohol or use drugs.  PERFORMANCE STATUS: The patient's performance status is 3 -  Symptomatic, >50% confined to bed  PHYSICAL EXAM: Most Recent Vital Signs: Blood pressure 109/72, pulse 93, temperature 98.2 F (36.8 C), resp. rate 18, height 6\' 1"  (1.854 m), weight 206 lb 2.1 oz (93.5 kg), SpO2 94 %. BP 109/72 (BP Location: Right Arm)   Pulse 93   Temp 98.8 F (37.1 C)   Resp 18   Ht 6\' 1"  (1.854 m)   Wt 206 lb 2.1 oz (93.5 kg)   SpO2 94%   BMI 27.20 kg/m  General appearance:  alert and cooperative Head: Normocephalic, without obvious abnormality, atraumatic Lungs: Bilateral air entry.  No wheezing. Heart: regular rate and rhythm Abdomen: soft, non-tender; bowel sounds normal; no masses,  no organomegaly Extremities: Trace edema bilaterally. Skin: Skin color, texture, turgor normal. No rashes or lesions Lymph nodes: No adenopathy noted. Neurologic: Grossly normal  LABORATORY DATA:  Results for orders placed or performed during the hospital encounter of 07/09/19 (from the past 48 hour(s))  Glucose, capillary     Status: Abnormal   Collection Time: 07/09/19  8:23 PM  Result Value Ref Range   Glucose-Capillary 117 (H) 70 - 99 mg/dL    Comment: Glucose reference range applies only to samples taken after fasting for at least 8 hours.  Glucose, capillary     Status: Abnormal   Collection Time: 07/10/19 12:29 AM  Result Value Ref Range   Glucose-Capillary 227 (H) 70 - 99 mg/dL    Comment: Glucose reference range applies only to samples taken after fasting for at least 8 hours.  Hemoglobin A1c     Status: Abnormal   Collection Time: 07/10/19  4:31 AM  Result Value Ref Range   Hgb A1c MFr Bld 8.0 (H) 4.8 - 5.6 %    Comment: (NOTE) Pre diabetes:          5.7%-6.4% Diabetes:              >6.4% Glycemic control for   <7.0% adults with diabetes    Mean Plasma Glucose 182.9 mg/dL    Comment: Performed at [facility] [address], [place], [place] [address]  Comprehensive metabolic panel     Status: Abnormal   Collection Time: 07/10/19  4:31 AM  Result Value Ref Range   Sodium 130 (L) 135 - 145 mmol/L   Potassium 3.9 3.5 - 5.1 mmol/L   Chloride 96 (L) 98 - 111 mmol/L   CO2 27 22 - 32 mmol/L   Glucose, Bld 263 (H) 70 - 99 mg/dL    Comment: Glucose reference range applies only to samples taken after fasting for at least 8 hours.   BUN 14 8 - 23 mg/dL   Creatinine, Ser 0.49 (L) 0.61 - 1.24 mg/dL   Calcium 7.7 (L) 8.9 - 10.3 mg/dL   Total Protein 4.9  (L) 6.5 - 8.1 g/dL   Albumin 2.1 (L) 3.5 - 5.0 g/dL   AST 15 15 - 41 U/L   ALT 18 0 - 44 U/L   Alkaline Phosphatase 90 38 - 126 U/L   Total Bilirubin 0.3 0.3 - 1.2 mg/dL   GFR calc non Af Amer >60 >60 mL/min   GFR calc Af Amer >60 >60 mL/min   Anion gap 7 5 - 15    Comment: Performed at [facility], [address], [place],  [address]  CBC WITH DIFFERENTIAL     Status: Abnormal   Collection Time: 07/10/19  4:31 AM  Result Value Ref Range   WBC 2.1 (  L) 4.0 - 10.5 K/uL    Comment: REPEATED TO VERIFY WHITE COUNT CONFIRMED ON SMEAR    RBC 2.45 (L) 4.22 - 5.81 MIL/uL   Hemoglobin 7.4 (L) 13.0 - 17.0 g/dL   HCT 22.1 (L) 39.0 - 52.0 %   MCV 90.2 80.0 - 100.0 fL   MCH 30.2 26.0 - 34.0 pg   MCHC 33.5 30.0 - 36.0 g/dL   RDW 14.4 11.5 - 15.5 %   Platelets 113 (L) 150 - 400 K/uL    Comment: REPEATED TO VERIFY PLATELET COUNT CONFIRMED BY SMEAR SPECIMEN CHECKED FOR CLOTS Immature Platelet Fraction may be clinically indicated, consider ordering this additional test [id number]    nRBC 0.0 0.0 - 0.2 %   Neutrophils Relative % 80 %   Neutro Abs 1.7 1.7 - 7.7 K/uL   Lymphocytes Relative 5 %   Lymphs Abs 0.1 (L) 0.7 - 4.0 K/uL   Monocytes Relative 8 %   Monocytes Absolute 0.2 0.1 - 1.0 K/uL   Eosinophils Relative 1 %   Eosinophils Absolute 0.0 0.0 - 0.5 K/uL   Basophils Relative 1 %   Basophils Absolute 0.0 0.0 - 0.1 K/uL   WBC Morphology INCREASED BANDS (>20% BANDS)     Comment: VACUOLATED NEUTROPHILS   Immature Granulocytes 5 %   Abs Immature Granulocytes 0.11 (H) 0.00 - 0.07 K/uL    Comment: Performed at [facility], [address] [place], [place] [address]  Magnesium     Status: None   Collection Time: 07/10/19  4:31 AM  Result Value Ref Range   Magnesium 1.7 1.7 - 2.4 mg/dL    Comment: Performed at [facility], [address], [place], [place] [address]  Glucose, capillary     Status: Abnormal   Collection Time: 07/10/19  5:06 AM  Result Value Ref Range    Glucose-Capillary 251 (H) 70 - 99 mg/dL    Comment: Glucose reference range applies only to samples taken after fasting for at least 8 hours.  Glucose, capillary     Status: Abnormal   Collection Time: 07/10/19  8:09 AM  Result Value Ref Range   Glucose-Capillary 213 (H) 70 - 99 mg/dL    Comment: Glucose reference range applies only to samples taken after fasting for at least 8 hours.  Glucose, capillary     Status: Abnormal   Collection Time: 07/10/19 12:04 PM  Result Value Ref Range   Glucose-Capillary 188 (H) 70 - 99 mg/dL    Comment: Glucose reference range applies only to samples taken after fasting for at least 8 hours.  Hemoglobin and hematocrit, blood     Status: Abnormal   Collection Time: 07/10/19  2:00 PM  Result Value Ref Range   Hemoglobin 7.2 (L) 13.0 - 17.0 g/dL   HCT 21.7 (L) 39.0 - 52.0 %    Comment: Performed at [facility], [address], [place], [place] [address]  Glucose, capillary     Status: Abnormal   Collection Time: 07/10/19  3:54 PM  Result Value Ref Range   Glucose-Capillary 181 (H) 70 - 99 mg/dL    Comment: Glucose reference range applies only to samples taken after fasting for at least 8 hours.   Comment 1 Notify RN   Glucose, capillary     Status: Abnormal   Collection Time: 07/10/19  8:18 PM  Result Value Ref Range   Glucose-Capillary 163 (H) 70 - 99 mg/dL    Comment: Glucose reference range applies only to samples taken after  fasting for at least 8 hours.   Comment 1 Notify RN    Comment 2 Document in Chart   CBC     Status: Abnormal   Collection Time: 07/10/19  8:44 PM  Result Value Ref Range   WBC 1.9 (L) 4.0 - 10.5 K/uL   RBC 2.30 (L) 4.22 - 5.81 MIL/uL   Hemoglobin 7.1 (L) 13.0 - 17.0 g/dL   HCT 20.9 (L) 39.0 - 52.0 %   MCV 90.9 80.0 - 100.0 fL   MCH 30.9 26.0 - 34.0 pg   MCHC 34.0 30.0 - 36.0 g/dL   RDW 14.6 11.5 - 15.5 %   Platelets 122 (L) 150 - 400 K/uL    Comment: Immature Platelet Fraction may be clinically indicated,  consider ordering this additional test [id number]    nRBC 0.0 0.0 - 0.2 %    Comment: Performed at [facility], [address] [place], [place] [address]  Glucose, capillary     Status: Abnormal   Collection Time: 07/11/19  1:05 AM  Result Value Ref Range   Glucose-Capillary 145 (H) 70 - 99 mg/dL    Comment: Glucose reference range applies only to samples taken after fasting for at least 8 hours.   Comment 1 Notify RN    Comment 2 Document in Chart   CBC     Status: Abnormal   Collection Time: 07/11/19  4:11 AM  Result Value Ref Range   WBC 2.0 (L) 4.0 - 10.5 K/uL   RBC 2.38 (L) 4.22 - 5.81 MIL/uL   Hemoglobin 7.1 (L) 13.0 - 17.0 g/dL   HCT 21.5 (L) 39.0 - 52.0 %   MCV 90.3 80.0 - 100.0 fL   MCH 29.8 26.0 - 34.0 pg   MCHC 33.0 30.0 - 36.0 g/dL   RDW 14.6 11.5 - 15.5 %   Platelets 118 (L) 150 - 400 K/uL    Comment: Immature Platelet Fraction may be clinically indicated, consider ordering this additional test [id number]    nRBC 0.0 0.0 - 0.2 %    Comment: Performed at [facility], [address], [place], [place] [id number]  Basic metabolic panel     Status: Abnormal   Collection Time: 07/11/19  4:11 AM  Result Value Ref Range   Sodium 131 (L) 135 - 145 mmol/L   Potassium 3.5 3.5 - 5.1 mmol/L   Chloride 96 (L) 98 - 111 mmol/L   CO2 27 22 - 32 mmol/L   Glucose, Bld 183 (H) 70 - 99 mg/dL    Comment: Glucose reference range applies only to samples taken after fasting for at least 8 hours.   BUN 10 8 - 23 mg/dL   Creatinine, Ser 0.45 (L) 0.61 - 1.24 mg/dL   Calcium 7.7 (L) 8.9 - 10.3 mg/dL   GFR calc non Af Amer >60 >60 mL/min   GFR calc Af Amer >60 >60 mL/min   Anion gap 8 5 - 15    Comment: Performed at [facility], [address], [place], [place] [address]  Glucose, capillary     Status: Abnormal   Collection Time: 07/11/19  8:03 AM  Result Value Ref Range   Glucose-Capillary 176 (H) 70 - 99 mg/dL    Comment: Glucose reference range applies only to samples taken after  fasting for at least 8 hours.  Glucose, capillary     Status: Abnormal   Collection Time: 07/11/19 11:35 AM  Result Value Ref Range   Glucose-Capillary 149 (H) 70 - 99  mg/dL    Comment: Glucose reference range applies only to samples taken after fasting for at least 8 hours.  Glucose, capillary     Status: Abnormal   Collection Time: 07/11/19  4:20 PM  Result Value Ref Range   Glucose-Capillary 144 (H) 70 - 99 mg/dL    Comment: Glucose reference range applies only to samples taken after fasting for at least 8 hours.      RADIOGRAPHY: No results found.     ASSESSMENT and PLAN:  1.  Esophageal adenocarcinoma: -4 doses of weekly carboplatin and paclitaxel with radiation therapy from 06/15/2019 through 07/06/2019. -We will resume therapy once the infection issues resolved.  2.  Sepsis with Streptococcus bacteremia: -Blood cultures showed Streptococcus species.  Further identification to follow. -He is currently receiving ceftriaxone. -We will follow up on the culture and sensitivity. -Echocardiogram was ordered by Dr. Roderic Palau. -Whether port needs to be removed will depend on ID recommendations.  3.  Bloody secretions: -This started yesterday.  Reportedly patient received Lovenox while in the ICU. -This is from a combination of esophagitis from radiation and Lovenox.  4.  Pancytopenia: -This is from chemotherapy. -If the absolute neutrophil count is less than 500, consider growth factor support. -Would recommend 1 unit of PRBC as his hemoglobin will continue to decline.  I have discussed this with Dr. Roderic Palau.  5.  Nutrition: -He will continue tube feeds with Osmolite 1.5, 6 cans 3 PM to 7 AM.  All questions were answered. The patient knows to call the clinic with any problems, questions or concerns. We can certainly see the patient much sooner if necessary.    Derek Jack

## 2019-07-11 NOTE — Progress Notes (Signed)
PROGRESS NOTE    Kurt Duran  [id number] DOB: 24-Sep-1945 DOA: 07/09/2019 PCP: Glenda Chroman, MD    Brief Narrative:   Kurt Duran is a 74 y.o. male with medical history significant of esophageal cancer who is receiving chemotherapy as well as radiation treatments.  Patient has an obstructive lesion and is PEG tube for nutrition/meds.  His last chemotherapy was on 3/31.  Reports of the past 10 days, he has had increasing secretions and cough.  He was prescribed scopolamine patch which caused dry mouth, but did not improve his respiratory secretions.  He has been feeling generally weak and unwell.  He came to the ER yesterday wife reports that his temperature was 100.74F prior to the visit.  During his ER stay, his labs were reassuring and he felt better and therefore discharged home.  Upon returning home, he had worsening fever this morning developed chills.  He was brought back to the emergency room where he was found to have a temperature of 104F.  Is also noted to be hypotensive and tachycardic.  Lactic acid markedly elevated over 4.  He was started on IV fluids per sepsis protocol.  CT of the abdomen pelvis as well as chest did not show any source of sepsis.  Coronavirus test was also negative.  He was noted to be neutropenic.  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 Severe sepsis with septic shock.  Patient was noted to be hypotensive, febrile and tachycardic on arrival.  He was also neutropenic. 2 sets of blood cultures positive for strep species.  Currently on intravenous  ceftriaxone.  Echocardiogram has been ordered.  Will discuss further management with infectious disease since it is possible that his port may need to be removed. He received IV fluid boluses per sepsis protocol.  Despite receiving 3 to 4 L of fluid in the ED, he remained hypotensive.  Started on low-dose norepinephrine infusion through his IV port.    Lactic acid trended down.  He was able to wean off of vasopressors.  Hemodynamic status has stabilized 2. Febrile neutropenia.    Related to bacteremia.  WBC count is trending back up. 3. Hyponatremia.  Related to volume depletion.    Improving with IV hydration. 4. Pancytopenia.  Likely related to recent chemotherapy.    Discussed with Dr. Delton Coombes and will transfuse 1 unit of PRBC.  Continue to treat supportively and monitor. 5. Diabetes. Patient was on glipizide prior to admission.  Will hold for now and continue on sliding scale insulin. 6. Esophageal adenocarcinoma.  He is followed by oncology for chemotherapy and radiation treatments.  Plans are to follow-up with cardiothoracic surgery as an outpatient to consider resection at some point.  Continue PEG tube feedings 7. GERD.  Continue on PPI and H2 blockers. 8. GI bleeding.  Patient began to have hematemesis after admission.  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  GI following.  Continue on PPI.   DVT prophylaxis: Heparin Code Status: DNR Family Communication: Discussed with wife at the bedside Disposition Plan: Patient is  from home.  Plan will be to discharge home the next 24 to 48 hours.  Barriers to discharge at this time are continue treatment with IV antibiotics for sepsis/febrile neutropenia and pending cultures   Consultants:   Gastroenterology  Oncology  Procedures:     Antimicrobials:   Cefepime 4/3> 4/5  Ceftriaxone 4/5 >   Subjective: Had an episode of vomiting overnight.  None since then.  Denies any shortness of breath.  Continues to have hacking  cough.  Objective: Vitals:   07/11/19 0555 07/11/19 1417 07/11/19 1700 07/11/19 1947  BP: 105/61 109/72    Pulse: (!) 112 93    Resp:  18    Temp: 98.2 F (36.8 C)  98.8 F (37.1 C)   TempSrc:      SpO2: 95% 94%  94%  Weight:      Height:        Intake/Output Summary (Last 24 hours) at 07/11/2019 2014 Last data filed at 07/11/2019 1700 Gross per 24 hour  Intake 0 ml  Output 1000 ml  Net -1000 ml   Filed Weights   07/09/19 0829 07/09/19 1414 07/10/19 0500  Weight: 89 kg 93.7 kg 93.5 kg    Examination:  General exam: Alert, awake, oriented x 3 Respiratory system: Clear to auscultation. Respiratory effort normal. Cardiovascular system:RRR. No murmurs, rubs, gallops. Gastrointestinal system: Abdomen is nondistended, soft and nontender. No organomegaly or masses felt. Normal bowel sounds heard.  Feeding tube in place Central nervous system: Alert and oriented. No focal neurological deficits. Extremities: No C/C/E, +pedal pulses Skin: No rashes, lesions or ulcers Psychiatry: Judgement and insight appear normal. Mood & affect appropriate.    Data Reviewed: I have personally reviewed following labs and imaging studies  CBC: Recent Labs  Lab 07/06/19 0852 07/06/19 0852 07/08/19 0317 07/08/19 0317 07/09/19 0844 07/10/19 0431 07/10/19 1400 07/10/19 2044 07/11/19 0411  WBC 3.2*   < > 3.7*  --  0.8* 2.1*  --  1.9* 2.0*  NEUTROABS 2.7  --  3.3  --  0.7* 1.7  --   --   --   HGB 9.5*   < > 8.6*   < > 8.6* 7.4* 7.2* 7.1* 7.1*  HCT 27.7*   < > 25.5*   < > 25.4* 22.1* 21.7* 20.9* 21.5*  MCV 89.1   < > 88.9  --  89.4 90.2  --  90.9 90.3  PLT 142*   < > 120*  --  126* 113*  --  122* 118*   < > = values in this interval not displayed.   Basic Metabolic Panel: Recent Labs  Lab 07/06/19 0852 07/08/19 0317 07/09/19 0844 07/10/19 0431 07/11/19 0411  NA 129* 129* 126* 130* 131*  K 3.9 4.3 3.7 3.9 3.5  CL 90* 90* 90* 96* 96*  CO2 27 25 23 27 27   GLUCOSE 178* 196* 267* 263*  183*  BUN 11 13 14 14 10   CREATININE 0.51* 0.52* 0.61 0.49* 0.45*  CALCIUM 8.2* 7.6* 7.8* 7.7* 7.7*  MG 1.6*  --  1.6* 1.7  --    GFR: Estimated Creatinine Clearance: 95.7 mL/min (A) (by C-G formula based on SCr of 0.45 mg/dL (L)). Liver Function Tests: Recent Labs  Lab 07/06/19 0852 07/08/19 0317 07/09/19 0844 07/10/19 0431  AST 21 20 28 15   ALT 27 23 24 18   ALKPHOS 88 85 111 90  BILITOT 0.4 0.4 0.6 0.3  PROT 5.8* 5.8* 5.6* 4.9*  ALBUMIN 2.6* 2.5* 2.4* 2.1*  No results for input(s): LIPASE, AMYLASE in the last 168 hours. No results for input(s): AMMONIA in the last 168 hours. Coagulation Profile: Recent Labs  Lab 07/09/19 0844  INR 1.2   Cardiac Enzymes: No results for input(s): CKTOTAL, CKMB, CKMBINDEX, TROPONINI in the last 168 hours. BNP (last 3 results) No results for input(s): PROBNP in the last 8760 hours. HbA1C: Recent Labs    07/10/19 0431  HGBA1C 8.0*   CBG: Recent Labs  Lab 07/11/19 0105 07/11/19 0803 07/11/19 1135 07/11/19 1620 07/11/19 1958  GLUCAP 145* 176* 149* 144* 140*   Lipid Profile: No results for input(s): CHOL, HDL, LDLCALC, TRIG, CHOLHDL, LDLDIRECT in the last 72 hours. Thyroid Function Tests: No results for input(s): TSH, T4TOTAL, FREET4, T3FREE, THYROIDAB in the last 72 hours. Anemia Panel: No results for input(s): VITAMINB12, FOLATE, FERRITIN, TIBC, IRON, RETICCTPCT in the last 72 hours. Sepsis Labs: Recent Labs  Lab 07/09/19 0844 07/09/19 1052 07/09/19 1322 07/09/19 1602  LATICACIDVEN 4.6* 5.0* 3.3* 2.7*    Recent Results (from the past 240 hour(s))  Blood Culture (routine x 2)     Status: None (Preliminary result)   Collection Time: 07/09/19  8:44 AM   Specimen: BLOOD RIGHT FOREARM  Result Value Ref Range Status   Specimen Description   Final    BLOOD RIGHT FOREARM BOTTLES DRAWN AEROBIC AND ANAEROBIC Performed at [facility], [address] [place], [place] [address]    Special Requests   Final    Blood Culture  adequate volume Performed at [facility], [address] [place], [place] [address]    Culture  Setup Time   Final    GRAM POSITIVE COCCI IN CHAINS ANAEROBIC BOTTLE ONLY CRITICAL RESULT CALLED TO, READ BACK BY AND VERIFIED WITH: RN R [place] [id number] AT 43 AM BY CM    Culture   Final    CULTURE REINCUBATED FOR BETTER GROWTH Performed at [facility], [place] [address] [place], [place] [address]    Report Status PENDING  Incomplete  Blood Culture (routine x 2)     Status: None (Preliminary result)   Collection Time: 07/09/19  8:44 AM   Specimen: Right Antecubital; Blood  Result Value Ref Range Status   Specimen Description   Final    RIGHT ANTECUBITAL BOTTLES DRAWN AEROBIC AND ANAEROBIC   Special Requests Blood Culture adequate volume  Final   Culture  Setup Time   Final    GRAM POSITIVE COCCI Gram Stain Report Called to,Read Back By and Verified With: BONDURANT,R @ [id number] ON 07/11/19 BY JUW AEROBIC BOTTLE ONLY GS DONE @ [facility] GRAM POSITIVE COCCI GRAM POSITIVE RODS Gram Stain Report Called to,Read Back By and Verified With:  MARY HOWARTON,RN @1711  07/11/2019 KAY ANAEROBIC BOTTLE ONLY    Culture   Final    NO GROWTH 2 DAYS Performed at [facility], [address] [place],  [address]    Report Status PENDING  Incomplete  Blood Culture ID Panel (Reflexed)     Status: Abnormal   Collection Time: 07/09/19  8:44 AM  Result Value Ref Range Status   Enterococcus species NOT DETECTED NOT DETECTED Final   Listeria monocytogenes NOT DETECTED NOT DETECTED Final   Staphylococcus species NOT DETECTED NOT DETECTED Final   Staphylococcus aureus (BCID) NOT DETECTED NOT DETECTED Final   Streptococcus species DETECTED (A) NOT DETECTED Final    Comment: Not Enterococcus species, Streptococcus agalactiae, Streptococcus pyogenes, or Streptococcus pneumoniae. CRITICAL RESULT CALLED TO, READ BACK BY AND VERIFIED  WITH: RN R BONDURANT [id number] AT [id number] AM BY CM    Streptococcus agalactiae NOT  DETECTED NOT DETECTED Final   Streptococcus pneumoniae NOT DETECTED NOT DETECTED Final   Streptococcus pyogenes NOT DETECTED NOT DETECTED Final   Acinetobacter baumannii NOT DETECTED NOT DETECTED Final   Enterobacteriaceae species NOT DETECTED NOT DETECTED Final   Enterobacter cloacae complex NOT DETECTED NOT DETECTED Final   Escherichia coli NOT DETECTED NOT DETECTED Final   Klebsiella oxytoca NOT DETECTED NOT DETECTED Final   Klebsiella pneumoniae NOT DETECTED NOT DETECTED Final   Proteus species NOT DETECTED NOT DETECTED Final   Serratia marcescens NOT DETECTED NOT DETECTED Final   Haemophilus influenzae NOT DETECTED NOT DETECTED Final   Neisseria meningitidis NOT DETECTED NOT DETECTED Final   Pseudomonas aeruginosa NOT DETECTED NOT DETECTED Final   Candida albicans NOT DETECTED NOT DETECTED Final   Candida glabrata NOT DETECTED NOT DETECTED Final   Candida krusei NOT DETECTED NOT DETECTED Final   Candida parapsilosis NOT DETECTED NOT DETECTED Final   Candida tropicalis NOT DETECTED NOT DETECTED Final    Comment: Performed at [facility], [place] [address] [place], [place] [address]  Urine culture     Status: None   Collection Time: 07/09/19  8:50 AM   Specimen: Urine, Catheterized  Result Value Ref Range Status   Specimen Description   Final    URINE, CATHETERIZED Performed at [facility], [address] [place], [place] [address]    Special Requests   Final    NONE Performed at [facility], [address] [place], [place] [address]    Culture   Final    NO GROWTH Performed at [facility], [place] [address], [place], [place] [address]    Report Status 07/10/2019 FINAL  Final  SARS CORONAVIRUS 2 (TAT 6-24 HRS) Nasopharyngeal Nasopharyngeal Swab     Status: None   Collection Time: 07/09/19  9:08 AM   Specimen: Nasopharyngeal Swab  Result Value Ref Range Status   SARS Coronavirus 2 NEGATIVE NEGATIVE Final    Comment: (NOTE) SARS-CoV-2 target nucleic acids are  NOT DETECTED. The SARS-CoV-2 RNA is generally detectable in upper and lower respiratory specimens during the acute phase of infection. Negative results do not preclude SARS-CoV-2 infection, do not rule out co-infections with other pathogens, and should not be used as the sole basis for treatment or other patient management decisions. Negative results must be combined with clinical observations, patient history, and epidemiological information. The expected result is Negative. Fact Sheet for Patients: SugarRoll.be Fact Sheet for Healthcare Providers: https://www.woods-mathews.com/ This test is not yet approved or cleared by the [place] FDA and  has been authorized for detection and/or diagnosis of SARS-CoV-2 by FDA under an Emergency Use Authorization (EUA). This EUA will remain  in effect (meaning this test can be used) for the duration of the COVID-19 declaration under Section 56 4(b)(1) of the Act, 21 U.S.C. section 360bbb-3(b)(1), unless the authorization is terminated or revoked sooner. Performed at [facility], [place] [address], [place], [place] [address]   Respiratory Panel by RT PCR (Flu A&B, Covid) - Nasopharyngeal Swab     Status: None   Collection Time: 07/09/19 11:45 AM   Specimen: Nasopharyngeal Swab  Result Value Ref Range Status   SARS Coronavirus 2 by RT PCR NEGATIVE NEGATIVE Final    Comment: (NOTE) SARS-CoV-2 target nucleic acids are NOT DETECTED. The SARS-CoV-2 RNA is generally detectable in upper respiratoy specimens during the acute  phase of infection. The lowest concentration of SARS-CoV-2 viral copies this assay can detect is 131 copies/mL. A negative result does not preclude SARS-Cov-2 infection and should not be used as the sole basis for treatment or other patient management decisions. A negative result may occur with  improper specimen collection/handling, submission of specimen other than nasopharyngeal  swab, presence of viral mutation(s) within the areas targeted by this assay, and inadequate number of viral copies (<131 copies/mL). A negative result must be combined with clinical observations, patient history, and epidemiological information. The expected result is Negative. Fact Sheet for Patients:  PinkCheek.be Fact Sheet for Healthcare Providers:  GravelBags.it This test is not yet ap proved or cleared by the [place] FDA and  has been authorized for detection and/or diagnosis of SARS-CoV-2 by FDA under an Emergency Use Authorization (EUA). This EUA will remain  in effect (meaning this test can be used) for the duration of the COVID-19 declaration under Section 564(b)(1) of the Act, 21 U.S.C. section 360bbb-3(b)(1), unless the authorization is terminated or revoked sooner.    Influenza A by PCR NEGATIVE NEGATIVE Final   Influenza B by PCR NEGATIVE NEGATIVE Final    Comment: (NOTE) The Xpert Xpress SARS-CoV-2/FLU/RSV assay is intended as an aid in  the diagnosis of influenza from Nasopharyngeal swab specimens and  should not be used as a sole basis for treatment. Nasal washings and  aspirates are unacceptable for Xpert Xpress SARS-CoV-2/FLU/RSV  testing. Fact Sheet for Patients: PinkCheek.be Fact Sheet for Healthcare Providers: GravelBags.it This test is not yet approved or cleared by the [place] FDA and  has been authorized for detection and/or diagnosis of SARS-CoV-2 by  FDA under an Emergency Use Authorization (EUA). This EUA will remain  in effect (meaning this test can be used) for the duration of the  Covid-19 declaration under Section 564(b)(1) of the Act, 21  U.S.C. section 360bbb-3(b)(1), unless the authorization is  terminated or revoked. Performed at [facility], [address] [place], [place] [address]   MRSA PCR Screening     Status:  None   Collection Time: 07/09/19  3:09 PM   Specimen: Nasal Mucosa; Nasopharyngeal  Result Value Ref Range Status   MRSA by PCR NEGATIVE NEGATIVE Final    Comment:        The GeneXpert MRSA Assay (FDA approved for NASAL specimens only), is one component of a comprehensive MRSA colonization surveillance program. It is not intended to diagnose MRSA infection nor to guide or monitor treatment for MRSA infections. Performed at [facility], [address], [place], [place] [address]          Radiology Studies: No results found.      Scheduled Meds: . sodium chloride   Intravenous Once  . ALPRAZolam  0.5 mg Per J Tube BID  . Chlorhexidine Gluconate Cloth  6 each Topical Daily  . famotidine  20 mg Per Tube Daily  . feeding supplement (PRO-STAT SUGAR FREE 64)  30 mL Per J Tube BID  . FLUoxetine  20 mg Per Tube Daily  . free water  300 mL Per Tube Q8H  . HYDROcodone-homatropine  5 mL Per Tube Q6H  . insulin aspart  0-15 Units Subcutaneous Q4H  . ondansetron (ZOFRAN) IV  4 mg Intravenous Q6H  . pantoprazole (PROTONIX) IV  40 mg Intravenous Q12H  . potassium chloride  40 mEq Per Tube Once   Continuous Infusions: . sodium chloride 1,000 mL (07/10/19 1839)  . cefTRIAXone (ROCEPHIN)  IV  2 g (07/11/19 1747)  . norepinephrine (LEVOPHED) Adult infusion Stopped (07/09/19 1530)     LOS: 2 days    Time spent: 83mins    Kathie Dike, MD [facility]   If 7PM-7AM, please contact night-coverage [email or web address]  07/11/2019, 8:14 PM

## 2019-07-11 NOTE — Progress Notes (Signed)
  Discussed esophageal stent with Dr. Rush Landmark, who recommends clearing this with Radiation Oncology and Oncology first to ensure this would not affect treatment plans.  I was able to reach Radiation Oncologist Dr. Francesca Jewett with [facility]. Stent not advisable as this would complicate radiation, stent attenuating the beam of radiation. However, if patient is unable to tolerate radiation or radiation plans pulled back, could consider stent if necessary.   Dr. Francesca Jewett provided cell number in case further input needed or updating regarding patient: [phone].

## 2019-07-11 NOTE — Progress Notes (Signed)
CRITICAL VALUE ALERT  Critical Value:  Positive blood culture 2 out of 4 bottles positive for gram positive cocci chains--strep species  Date & Time Notied:  07/11/19 @ 0720  Provider Notified: Dr. Roderic Palau  Orders Received/Actions taken: awaiting orders--info passed onto day shift.

## 2019-07-11 NOTE — Progress Notes (Signed)
CRITICAL VALUE ALERT  Critical Value:  POSITIVE FOR GRAM POSITIVE COCCI, 2ND SET, ANEROBIC  Date & Time Notied:  07/11/19 @ 131  Provider Notified: Dr. Darrick Meigs  Orders Received/Actions taken: no new orders patient already on antibiotics

## 2019-07-11 NOTE — Progress Notes (Signed)
Initial Nutrition Assessment  DOCUMENTATION CODES:      INTERVENTION:  When pt is cleared to resume tube feeds recommend continue with home regimen:  Osmolite 1.5 (6 cartons daily) -89 ml/hr from 3 pm - 7 am. Free water -1000 ml daily. Protein modular 30 ml-TID.   NUTRITION DIAGNOSIS:   Increased nutrient needs related to cancer and cancer related treatments as evidenced by estimated needs.   GOAL:  Patient will meet greater than or equal to 90% of their needs   MONITOR:  Labs, Weight trends, I & O's(medical clearance to resume tube feeds)   REASON FOR ASSESSMENT:  Malnutrition Screening Tool- Home tube feeding.     ASSESSMENT: Patient has esophageal cancer and is undergoing chemo and radiation therapy. Lives with his spouse. He presents with fever,hypotension and tachycardic. Elevated lactic acid, neutropenic. Severe sepsis with septic shock (blood cultures pending). Bloody secretions.   Talked with patient spouse Jana Half who reports good tolerance of tube feeds up to current acute illness. He has 13 more radiation treatments.   Followed by outpatient RD- last contact 3/26. He is NPO- PEG dependent for 100% of nutrition provision. Tube feeding being held currently. Patient receiving IV fluids-NS @75  ml/hr.   Home regimen: Osmolite 1.5 (6 cartons daily) -89 ml/hr from 3 pm - 7 am. Free water -1000 ml daily. Protein modular 30 ml-TID (ProSource). Provides: 2313 kcal, 134.4 gr protein and 1086 water from formula and free water flushes for total ~2100 ml water daily  Weight: currently 205.7 lb (93.5 kg)  3/26 -wt 194 lb (88.1 kg)  Medications reviewed.   Labs reviewed: sodium  131 (L), glucose 183 , WBC-2.0 (L), Hgb 7.1 (L). CBG's-145, 176, 149 mg/dl.    NUTRITION - FOCUSED PHYSICAL EXAM: Deferred   Diet Order:   Diet Order            Diet NPO time specified  Diet effective midnight        Diet NPO time specified  Diet effective now              EDUCATION NEEDS:   Education needs have been addressed Skin:  Skin Assessment: Reviewed RN Assessment  Last BM:  prior to admission  Height:   Ht Readings from Last 1 Encounters:  07/09/19 6\' 1"  (1.854 m)    Weight:   Wt Readings from Last 1 Encounters:  07/10/19 93.5 kg    Ideal Body Weight:   75 kg  BMI:  Body mass index is 27.2 kg/m.  Estimated Nutritional Needs:   Kcal:  2150-2580  Protein:  110-130 gr  Fluid:  > 2liters daily   Colman Cater MS,RD,CSG,LDN Contact available through [employer]

## 2019-07-11 NOTE — Progress Notes (Addendum)
Subjective: Requires suction at bedside due to pooling secretions. Coughing intermittently, stating secretions collect at back of throat. No abdominal pain. No melena. No vomiting.   Objective: Vital signs in last 24 hours: Temp:  [97.8 F (36.6 C)-99.5 F (37.5 C)] 98.2 F (36.8 C) (04/05 0555) Pulse Rate:  [91-112] 112 (04/05 0555) Resp:  [16-28] 18 (04/05 0007) BP: (102-175)/(44-158) 105/61 (04/05 0555) SpO2:  [87 %-99 %] 95 % (04/05 0555) Last BM Date: 07/09/19 General:   Alert and oriented, pleasant, chronically ill-appearing and frail Head:  Normocephalic and atraumatic. Abdomen:  Bowel sounds present, soft, non-tender, non-distended. J tube in place Neurologic:  Alert and  oriented x4  Intake/Output from previous day: 04/04 0701 - 04/05 0700 In: 2497.1 [I.V.:666; NG/GT:1731; IV Piggyback:100.1] Out: 300 [Urine:300] Intake/Output this shift: No intake/output data recorded.  Lab Results: Recent Labs    07/10/19 0431 07/10/19 0431 07/10/19 1400 07/10/19 2044 07/11/19 0411  WBC 2.1*  --   --  1.9* 2.0*  HGB 7.4*   < > 7.2* 7.1* 7.1*  HCT 22.1*   < > 21.7* 20.9* 21.5*  PLT 113*  --   --  122* 118*   < > = values in this interval not displayed.   BMET Recent Labs    07/09/19 0844 07/10/19 0431 07/11/19 0411  NA 126* 130* 131*  K 3.7 3.9 3.5  CL 90* 96* 96*  CO2 23 27 27   GLUCOSE 267* 263* 183*  BUN 14 14 10   CREATININE 0.61 0.49* 0.45*  CALCIUM 7.8* 7.7* 7.7*   LFT Recent Labs    07/09/19 0844 07/10/19 0431  PROT 5.6* 4.9*  ALBUMIN 2.4* 2.1*  AST 28 15  ALT 24 18  ALKPHOS 111 90  BILITOT 0.6 0.3   PT/INR Recent Labs    07/09/19 0844  LABPROT 15.1  INR 1.2     Studies/Results: CT Chest W Contrast  Result Date: 07/09/2019 CLINICAL DATA:  Fever and history of esophageal cancer.  Sepsis. EXAM: CT CHEST, ABDOMEN, AND PELVIS WITH CONTRAST TECHNIQUE: Multidetector CT imaging of the chest, abdomen and pelvis was performed following the  standard protocol during bolus administration of intravenous contrast. CONTRAST:  115mL OMNIPAQUE IOHEXOL 300 MG/ML  SOLN COMPARISON:  05/09/2019 FINDINGS: CT CHEST FINDINGS Cardiovascular: Normal heart size. No pericardial effusion. Extensive coronary atherosclerotic calcification. Porta catheter with tip at the distal SVC. Mediastinum/Nodes: Lower esophageal mass with wall thickening and retain fluid superiorly. There is indistinct fat at the GE junction with periesophageal nodularity likely reflecting tumor. No noted perforation. Small bilateral thyroid nodules measuring 1 cm or less. No followup recommended (ref: J Am Coll Radiol. 2015 Feb;12(2): 143-50). Lungs/Pleura: Small left pleural effusion without nodularity. Left lower lobe atelectasis. Musculoskeletal: Spondylosis. Subcutaneous lipoma partially covered posterior to the right shoulder. CT ABDOMEN PELVIS FINDINGS Hepatobiliary: Vague 4 mm low-density in the subcapsular high right lobe liver.Sludge or noncalcified gallstones. The gallbladder is full but there is no acute cholecystitis. Pancreas: Unremarkable. Spleen: Unremarkable. Adrenals/Urinary Tract: Small left adrenal myelolipoma. No hydronephrosis or stone. Unremarkable bladder. Stomach/Bowel: Fluid levels reach the distal colon. No bowel wall thickening. Percutaneous jejunostomy tube in good position. Negative for bowel obstruction. Vascular/Lymphatic: No acute vascular abnormality. Diffuse atherosclerotic calcification. Mildly enlarged lymph nodes about the GE junction. Reproductive:No acute finding Other: No ascites or pneumoperitoneum. Musculoskeletal: Extensive spondylosis. No bone metastases by recent PET-CT. IMPRESSION: 1. No specific cause of sepsis. 2. There is liquid stool but no colitic wall thickening. 3. Small left pleural  effusion with atelectasis. 4. Known obstructive lower esophageal mass. Subcentimeter low-density in the upper right liver, attention on follow-up. 5. Cholelithiasis.  Electronically Signed   By: Monte Fantasia M.D.   On: 07/09/2019 10:27   CT ABDOMEN PELVIS W CONTRAST  Result Date: 07/09/2019 CLINICAL DATA:  Fever and history of esophageal cancer.  Sepsis. EXAM: CT CHEST, ABDOMEN, AND PELVIS WITH CONTRAST TECHNIQUE: Multidetector CT imaging of the chest, abdomen and pelvis was performed following the standard protocol during bolus administration of intravenous contrast. CONTRAST:  169mL OMNIPAQUE IOHEXOL 300 MG/ML  SOLN COMPARISON:  05/09/2019 FINDINGS: CT CHEST FINDINGS Cardiovascular: Normal heart size. No pericardial effusion. Extensive coronary atherosclerotic calcification. Porta catheter with tip at the distal SVC. Mediastinum/Nodes: Lower esophageal mass with wall thickening and retain fluid superiorly. There is indistinct fat at the GE junction with periesophageal nodularity likely reflecting tumor. No noted perforation. Small bilateral thyroid nodules measuring 1 cm or less. No followup recommended (ref: J Am Coll Radiol. 2015 Feb;12(2): 143-50). Lungs/Pleura: Small left pleural effusion without nodularity. Left lower lobe atelectasis. Musculoskeletal: Spondylosis. Subcutaneous lipoma partially covered posterior to the right shoulder. CT ABDOMEN PELVIS FINDINGS Hepatobiliary: Vague 4 mm low-density in the subcapsular high right lobe liver.Sludge or noncalcified gallstones. The gallbladder is full but there is no acute cholecystitis. Pancreas: Unremarkable. Spleen: Unremarkable. Adrenals/Urinary Tract: Small left adrenal myelolipoma. No hydronephrosis or stone. Unremarkable bladder. Stomach/Bowel: Fluid levels reach the distal colon. No bowel wall thickening. Percutaneous jejunostomy tube in good position. Negative for bowel obstruction. Vascular/Lymphatic: No acute vascular abnormality. Diffuse atherosclerotic calcification. Mildly enlarged lymph nodes about the GE junction. Reproductive:No acute finding Other: No ascites or pneumoperitoneum. Musculoskeletal:  Extensive spondylosis. No bone metastases by recent PET-CT. IMPRESSION: 1. No specific cause of sepsis. 2. There is liquid stool but no colitic wall thickening. 3. Small left pleural effusion with atelectasis. 4. Known obstructive lower esophageal mass. Subcentimeter low-density in the upper right liver, attention on follow-up. 5. Cholelithiasis. Electronically Signed   By: Monte Fantasia M.D.   On: 07/09/2019 10:27   DG Chest Port 1 View  Result Date: 07/09/2019 CLINICAL DATA:  Fever.  History of esophageal carcinoma EXAM: PORTABLE CHEST 1 VIEW COMPARISON:  July 08, 2019 FINDINGS: Port-A-Cath tip is in the superior vena cava. No pneumothorax. There is a small left pleural effusion. Lungs elsewhere are clear. Heart size and pulmonary vascularity are normal. No appreciable mediastinal widening. There is aortic atherosclerosis. No evident adenopathy. Postoperative change noted in lower cervical spine. Postoperative change also noted in right shoulder. IMPRESSION: Small left pleural effusion. Lungs elsewhere clear. Stable cardiac silhouette. Aortic Atherosclerosis (ICD10-I70.0). Port-A-Cath tip in superior vena cava.  No pneumothorax. Electronically Signed   By: Lowella Grip III M.D.   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 He continues to use suction intermittently at bedside as secretions pool in back of throat but appear clear at bedside today.   Bleeding multifactorial in setting of known esophageal tumor and possible radiation-induced friability. I will be contacting Dr. Rush Landmark regarding candidacy for temporary esophageal stent  placement to help supportively. Hgb remains stable over past 24 hours.     Plan: Follow H/H serially Continue PPI BID Hold on endoscopy  Discussing with Dr. Rush Landmark esophageal stent.  Strict NPO Keep HOB at least 45 degrees Further recommendations to follow  Annitta Needs, PhD, ANP-BC [facility]    LOS: 2 days    07/11/2019, 8:18 AM

## 2019-07-11 NOTE — Progress Notes (Signed)
Patient's blood culture (anerobic bottle) grew gram positive cocci and rods, MD informed.

## 2019-07-11 NOTE — Care Management Important Message (Signed)
Important Message  Patient Details  Name: Kurt Duran MRN: [id number] Date of Birth: 11-17-1945   Medicare Important Message Given:  Yes     Tommy Medal 07/11/2019, 12:12 PM

## 2019-07-12 ENCOUNTER — Encounter (HOSPITAL_COMMUNITY)
Admission: EM | Disposition: A | Discharge status: Skilled Nursing Facility | Payer: Self-pay | Source: Home / Self Care | Attending: Internal Medicine

## 2019-07-12 ENCOUNTER — Inpatient Hospital Stay (HOSPITAL_COMMUNITY): Payer: Medicare Other

## 2019-07-12 DIAGNOSIS — R7881 Bacteremia: Secondary | ICD-10-CM

## 2019-07-12 LAB — CBC
HCT: 25.4 % — ABNORMAL LOW (ref 39.0–52.0)
Hemoglobin: 8.6 g/dL — ABNORMAL LOW (ref 13.0–17.0)
MCH: 30.3 pg (ref 26.0–34.0)
MCHC: 33.9 g/dL (ref 30.0–36.0)
MCV: 89.4 fL (ref 80.0–100.0)
Platelets: 133 10*3/uL — ABNORMAL LOW (ref 150–400)
RBC: 2.84 MIL/uL — ABNORMAL LOW (ref 4.22–5.81)
RDW: 14 % (ref 11.5–15.5)
WBC: 2.3 10*3/uL — ABNORMAL LOW (ref 4.0–10.5)
nRBC: 0 % (ref 0.0–0.2)

## 2019-07-12 LAB — BPAM RBC
Blood Product Expiration Date: 202105082359
ISSUE DATE / TIME: 202104052349
Unit Type and Rh: 5100

## 2019-07-12 LAB — ECHOCARDIOGRAM LIMITED
Height: 73 in
Weight: 3298.08 oz

## 2019-07-12 LAB — TYPE AND SCREEN
ABO/RH(D): O POS
Antibody Screen: NEGATIVE
Unit division: 0

## 2019-07-12 LAB — BASIC METABOLIC PANEL
Anion gap: 9 (ref 5–15)
BUN: 10 mg/dL (ref 8–23)
CO2: 26 mmol/L (ref 22–32)
Calcium: 7.7 mg/dL — ABNORMAL LOW (ref 8.9–10.3)
Chloride: 96 mmol/L — ABNORMAL LOW (ref 98–111)
Creatinine, Ser: 0.47 mg/dL — ABNORMAL LOW (ref 0.61–1.24)
GFR calc Af Amer: >60 mL/min (ref >60)
GFR calc non Af Amer: >60 mL/min (ref >60)
Glucose, Bld: 157 mg/dL — ABNORMAL HIGH (ref 70–99)
Potassium: 3.8 mmol/L (ref 3.5–5.1)
Sodium: 131 mmol/L — ABNORMAL LOW (ref 135–145)

## 2019-07-12 LAB — GLUCOSE, CAPILLARY
Glucose-Capillary: 150 mg/dL — ABNORMAL HIGH (ref 70–99)
Glucose-Capillary: 143 mg/dL — ABNORMAL HIGH (ref 70–99)
Glucose-Capillary: 149 mg/dL — ABNORMAL HIGH (ref 70–99)
Glucose-Capillary: 129 mg/dL — ABNORMAL HIGH (ref 70–99)
Glucose-Capillary: 171 mg/dL — ABNORMAL HIGH (ref 70–99)

## 2019-07-12 SURGERY — ESOPHAGOGASTRODUODENOSCOPY (EGD) WITH PROPOFOL
Anesthesia: Monitor Anesthesia Care

## 2019-07-12 MED ORDER — OSMOLITE 1.5 CAL PO LIQD
1000.0000 mL | ORAL | Status: DC
  Administered 2019-07-12 – 2019-07-14 (×2): 1000 mL

## 2019-07-12 MED ORDER — OSMOLITE 1.5 CAL PO LIQD: 1000.0000 mL | ORAL | Status: DC

## 2019-07-12 NOTE — Evaluation (Signed)
Physical Therapy Evaluation Patient Details Name: Kurt Duran MRN: [id number] DOB: Apr 05, 1946 Today's Date: 07/12/2019   History of Present Illness  Kurt Duran is a 74 y.o. male with medical history significant of esophageal cancer who is receiving chemotherapy as well as radiation treatments.  Patient has an obstructive lesion and is PEG tube for nutrition/meds.  His last chemotherapy was on 3/31.  Reports of the past 10 days, he has had increasing secretions and cough.  He was prescribed scopolamine patch which caused dry mouth, but did not improve his respiratory secretions.  He has been feeling generally weak and unwell.  He came to the ER yesterday wife reports that his temperature was 100.43F prior to the visit.  During his ER stay, his labs were reassuring and he felt better and therefore discharged home.  Upon returning home, he had worsening fever this morning developed chills.  He was brought back to the emergency room where he was found to have a temperature of 104F.  Is also noted to be hypotensive and tachycardic.  Lactic acid markedly elevated over 4.  He was started on IV fluids per sepsis protocol.  CT of the abdomen pelvis as well as chest did not show any source of sepsis.  Coronavirus test was also negative.  He was noted to be neutropenic.  He has been referred for admission    Clinical Impression  Patient limited for functional mobility as stated below secondary to BLE weakness, fatigue and poor standing balance. Patient requires min assist for bed mobility with HOB elevated to transition to seated EOB. He requires mod assist and use of RW to transfer to standing along with verbal and tactile cueing for sequencing and RW use. He is limited to several small steps forward and lateral at bedside secondary to weakness and impaired activity tolerance. Patient demonstrates good sitting tolerance at EOB to end session but fatigues rather quickly and requests return to bed. Patient feels  that he is too weak to return home at this time.  Patient will benefit from continued physical therapy in hospital and recommended venue below to increase strength, balance, endurance for safe ADLs and gait.     Follow Up Recommendations SNF    Equipment Recommendations  None recommended by PT    Recommendations for Other Services       Precautions / Restrictions Precautions Precautions: Fall Restrictions Weight Bearing Restrictions: No      Mobility  Bed Mobility Overal bed mobility: Needs Assistance Bed Mobility: Supine to Sit;Sit to Supine     Supine to sit: Min assist Sit to supine: Min assist   General bed mobility comments: slow, labored transition to seated EOB  Transfers Overall transfer level: Needs assistance Equipment used: Rolling walker (2 wheeled) Transfers: Risk manager;Sit to/from Stand Sit to Stand: Mod assist Stand pivot transfers: Mod assist       General transfer comment: slow, labored with physical assist to stand, verbal and tactile cueing for use of RW and squencing  Ambulation/Gait Ambulation/Gait assistance: Min assist Gait Distance (Feet): 5 Feet Assistive device: Rolling walker (2 wheeled) Gait Pattern/deviations: Decreased step length - right;Decreased step length - left;Shuffle Gait velocity: decreased   General Gait Details: Patient limited to several small steps at bedside due to weakness and fatigue, forward and lateral steps  Stairs            Wheelchair Mobility    Modified Rankin (Stroke Patients Only)       Balance Overall  balance assessment: Needs assistance Sitting-balance support: Feet supported;No upper extremity supported Sitting balance-Leahy Scale: Good Sitting balance - Comments: seated EOB   Standing balance support: Bilateral upper extremity supported Standing balance-Leahy Scale: Poor Standing balance comment: requires RW and assist for safety/balance                              Pertinent Vitals/Pain Pain Assessment: No/denies pain    Home Living Family/patient expects to be discharged to:: Private residence Living Arrangements: Spouse/significant other Available Help at Discharge: Family;Available 24 hours/day Type of Home: House Home Access: Ramped entrance     Home Layout: One level Home Equipment: Cane - single point Additional Comments: tripod cane    Prior Function Level of Independence: Independent with assistive device(s);Needs assistance   Gait / Transfers Assistance Needed: household and limited distance community ambulator with [facility] as needed  ADL's / Homemaking Assistance Needed: Patient mostly independent but wife assists PRN        Hand Dominance        Extremity/Trunk Assessment   Upper Extremity Assessment Upper Extremity Assessment: Generalized weakness    Lower Extremity Assessment Lower Extremity Assessment: Generalized weakness    Cervical / Trunk Assessment Cervical / Trunk Assessment: Normal  Communication   Communication: No difficulties  Cognition Arousal/Alertness: Awake/alert Behavior During Therapy: WFL for tasks assessed/performed Overall Cognitive Status: Within Functional Limits for tasks assessed                                        General Comments      Exercises     Assessment/Plan    PT Assessment Patient needs continued PT services  PT Problem List Decreased strength;Decreased mobility;Decreased activity tolerance;Decreased balance;Decreased knowledge of use of DME       PT Treatment Interventions DME instruction;Therapeutic exercise;Gait training;Balance training;Stair training;Neuromuscular re-education;Functional mobility training;Patient/family education;Therapeutic activities    PT Goals (Current goals can be found in the Care Plan section)  Acute Rehab PT Goals Patient Stated Goal: Get stronger and return home PT Goal Formulation: With patient Time For Goal  Achievement: 07/26/19 Potential to Achieve Goals: Good    Frequency Min 3X/week   Barriers to discharge        Co-evaluation               AM-PAC PT "6 Clicks" Mobility  Outcome Measure Help needed turning from your back to your side while in a flat bed without using bedrails?: None Help needed moving from lying on your back to sitting on the side of a flat bed without using bedrails?: A Little Help needed moving to and from a bed to a chair (including a wheelchair)?: A Lot Help needed standing up from a chair using your arms (e.g., wheelchair or bedside chair)?: A Lot Help needed to walk in hospital room?: A Lot Help needed climbing 3-5 steps with a railing? : Total 6 Click Score: 14    End of Session   Activity Tolerance: Patient limited by fatigue;Patient tolerated treatment well Patient left: in bed;with family/visitor present;with call bell/phone within reach Nurse Communication: Mobility status PT Visit Diagnosis: Unsteadiness on feet (R26.81);Other abnormalities of gait and mobility (R26.89);Muscle weakness (generalized) (M62.81)    Time: [id number] PT Time Calculation (min) (ACUTE ONLY): 26 min   Charges:   PT Evaluation $PT Eval Moderate Complexity: 1 Mod  PT Treatments $Therapeutic Activity: 8-22 mins       12:30 PM, 07/12/19 Mearl Latin PT, DPT Physical Therapist at [facility]

## 2019-07-12 NOTE — Progress Notes (Signed)
Subjective: No pain in his esophagus. Continues to spit out oral secretions. They look clear while I am in the room. Collection the canister is dark. Patient reports intermittently spitting up dark secretions since February, it was the bright red blood a couple days ago that was new. No vomiting. No nausea. No abdominal pain. No melena or brbpr. Continues to feel weak. Wife is at bedside.   Objective: Vital signs in last 24 hours: Temp:  [98.1 F (36.7 C)-98.8 F (37.1 C)] 98.3 F (36.8 C) (04/06 0616) Pulse Rate:  [84-93] 84 (04/06 0616) Resp:  [16-20] 16 (04/06 0616) BP: (109-131)/(72-89) 129/89 (04/06 0616) SpO2:  [94 %-99 %] 94 % (04/06 0616) Last BM Date: 07/09/19 General:   Alert and oriented, pleasant Head:  Normocephalic and atraumatic. Eyes:  No icterus, sclera clear. Conjuctiva pink.  Mouth:  Without lesions, mucosa pink. Neck:  Supple, without thyromegaly or masses.  Abdomen: Decreased bowel sounds but present in all 4 quadrants, soft, non-tender, non-distended. No HSM or hernias noted. No rebound or guarding. No masses appreciated  Msk:  Symmetrical without gross deformities. Extremities:  Minimal bilateral LE pitting edema in his ankles. SCDs in place.  Neurologic:  Alert and  oriented x4;  grossly normal neurologically. Skin:  Warm and dry, intact without significant lesions.  Psych: Normal mood and affect.  Intake/Output from previous day: 04/05 0701 - 04/06 0700 In: 1519.2 [I.V.:1188.7; Blood:330.5] Out: 1950 [id number] Intake/Output this shift: No intake/output data recorded.  Lab Results: Recent Labs    07/10/19 2044 07/11/19 0411 07/12/19 0556  WBC 1.9* 2.0* 2.3*  HGB 7.1* 7.1* 8.6*  HCT 20.9* 21.5* 25.4*  PLT 122* 118* 133*   BMET Recent Labs    07/10/19 0431 07/11/19 0411 07/12/19 0556  NA 130* 131* 131*  K 3.9 3.5 3.8  CL 96* 96* 96*  CO2 27 27 26   GLUCOSE 263* 183* 157*  BUN 14 10 10   CREATININE 0.49* 0.45* 0.47*  CALCIUM 7.7* 7.7*  7.7*   LFT Recent Labs    07/09/19 0844 07/10/19 0431  PROT 5.6* 4.9*  ALBUMIN 2.4* 2.1*  AST 28 15  ALT 24 18  ALKPHOS 111 90  BILITOT 0.6 0.3   PT/INR Recent Labs    07/09/19 0844  LABPROT 15.1  INR 1.2   Assessment: 74 year old male 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 Secretions have since returned to clear or dark. Per patient intermittent dark secretions have been present since February. While I am in the room, secretions appear clear but collection in the canister is dark. Denies odynophagia. Oral  Mucosa without lesions. His hemoglobin had dropped initially from 8.6 to 7.4 between 4/3-4/4 but remained stable thereafter. He did receive one unit of PRBCs on 4/6 per Dr. Delton Coombes as it is expected for his hemoglobin to continue to trend down with chemo and radiation.   Bleeding multifactorial in setting of known esophageal tumor, likely radiation-induced friability, and receiving heparin on 4/3 and 4/4. EGD at this time is likely of low diagnostic yield as discussed yesterday in the absence of odynophagia as this makes infectious esophagitis such as Candida or HSV less likely. Recommend continuing current medications and monitor for changes.   Possibility  of esophageal stent to help with tolerating oral secretions was discussed with Radiation Oncology, Dr. Francesca Jewett with [facility]. Stent not advisable as this would complicate radiation, stent attenuating the beam of radiation. However, if patient is unable to tolerate radiation or radiation plans pulled back, could consider stent if necessary.    Plan: Continue Protonix BID Continue antiemetic to prevent heaving.  Continue to hold on endoscopy.  Monitor H/H Strict NPO Keep head of bed elevated at 45 degrees  Agree with continuing to hold heparin.    LOS: 3 days    07/12/2019, 7:31 AM   Aliene Altes, PA-C [facility]

## 2019-07-12 NOTE — Plan of Care (Signed)
  Problem: Acute Rehab PT Goals(only PT should resolve) Goal: Pt Will Go Supine/Side To Sit Outcome: Progressing Flowsheets (Taken 07/12/2019 1232) Pt will go Supine/Side to Sit: with supervision Goal: Pt Will Go Sit To Supine/Side Outcome: Progressing Flowsheets (Taken 07/12/2019 1232) Pt will go Sit to Supine/Side: with supervision Goal: Patient Will Transfer Sit To/From Stand Outcome: Progressing [place] (Taken 07/12/2019 1232) Patient will transfer sit to/from stand: with min guard assist Goal: Pt Will Transfer Bed To Chair/Chair To Bed Outcome: Progressing Flowsheets (Taken 07/12/2019 1232) Pt will Transfer Bed to Chair/Chair to Bed: min guard assist Goal: Pt Will Ambulate Outcome: Progressing Flowsheets (Taken 07/12/2019 1232) Pt will Ambulate:  25 feet  with min guard assist  with least restrictive assistive device   12:33 PM, 07/12/19 Mearl Latin PT, DPT Physical Therapist at [facility]

## 2019-07-12 NOTE — Progress Notes (Signed)
No BT reactions has been reported and noted. Pt. Tolerated blood administration, VS has been monitored and recorded.(Please see Flowsheets)

## 2019-07-12 NOTE — Consult Note (Signed)
[facility] Oncology Progress Note  Name: Kurt Duran      MRN: [id number]    Location: A303/A303-01  Date: 07/12/2019 Time:6:08 PM   Subjective: Interval History:Kurt Duran was seen lying in his bed.  Tube feeds have been started back.  Still has some blood-tinged sputum suctioned out.  Had an echocardiogram today.  Has been afebrile.  Was able to stand with a walker when physical therapy came in.  Wife at bedside.  Objective: Vital signs in last 24 hours: Temp:  [98 F (36.7 C)-98.8 F (37.1 C)] 98 F (36.7 C) (04/06 1459) Pulse Rate:  [78-93] 78 (04/06 1459) Resp:  [16-20] 18 (04/06 1459) BP: (120-131)/(74-89) 121/74 (04/06 1459) SpO2:  [94 %-99 %] 96 % (04/06 1459)    Intake/Output from previous day: 04/05 0800 - 04/06 0759 In: 1519.2 [I.V.:1188.7] Out: 1950 [Urine:1950]    Intake/Output this shift: Total I/O In: -  Out: 1050 [Urine:1050]   PHYSICAL EXAM: BP 121/74 (BP Location: Left Arm)   Pulse 78   Temp 98 F (36.7 C)   Resp 18   Ht 6\' 1"  (1.854 m)   Wt 206 lb 2.1 oz (93.5 kg)   SpO2 96%   BMI 27.20 kg/m  General appearance: alert, cooperative and appears stated age Abdomen: soft, non-tender; bowel sounds normal; no masses,  no organomegaly Extremities: extremities normal, atraumatic, no cyanosis or edema Skin: Skin color, texture, turgor normal. No rashes or lesions Neurologic: Grossly normal   Studies/Results: Results for orders placed or performed during the hospital encounter of 07/09/19 (from the past 48 hour(s))  Glucose, capillary     Status: Abnormal   Collection Time: 07/10/19  8:18 PM  Result Value Ref Range   Glucose-Capillary 163 (H) 70 - 99 mg/dL    Comment: Glucose reference range applies only to samples taken after fasting for at least 8 hours.   Comment 1 Notify RN    Comment 2 Document in Chart   CBC     Status: Abnormal   Collection Time: 07/10/19  8:44 PM  Result Value Ref Range   WBC 1.9 (L) 4.0 - 10.5 K/uL   RBC  2.30 (L) 4.22 - 5.81 MIL/uL   Hemoglobin 7.1 (L) 13.0 - 17.0 g/dL   HCT 20.9 (L) 39.0 - 52.0 %   MCV 90.9 80.0 - 100.0 fL   MCH 30.9 26.0 - 34.0 pg   MCHC 34.0 30.0 - 36.0 g/dL   RDW 14.6 11.5 - 15.5 %   Platelets 122 (L) 150 - 400 K/uL    Comment: Immature Platelet Fraction may be clinically indicated, consider ordering this additional test [id number]    nRBC 0.0 0.0 - 0.2 %    Comment: Performed at [facility], [address] [place], [place] [address]  Glucose, capillary     Status: Abnormal   Collection Time: 07/11/19  1:05 AM  Result Value Ref Range   Glucose-Capillary 145 (H) 70 - 99 mg/dL    Comment: Glucose reference range applies only to samples taken after fasting for at least 8 hours.   Comment 1 Notify RN    Comment 2 Document in Chart   CBC     Status: Abnormal   Collection Time: 07/11/19  4:11 AM  Result Value Ref Range   WBC 2.0 (L) 4.0 - 10.5 K/uL   RBC 2.38 (L) 4.22 - 5.81 MIL/uL   Hemoglobin 7.1 (L) 13.0 - 17.0 g/dL   HCT 21.5 (L) 39.0 -  52.0 %   MCV 90.3 80.0 - 100.0 fL   MCH 29.8 26.0 - 34.0 pg   MCHC 33.0 30.0 - 36.0 g/dL   RDW 14.6 11.5 - 15.5 %   Platelets 118 (L) 150 - 400 K/uL    Comment: Immature Platelet Fraction may be clinically indicated, consider ordering this additional test [id number]    nRBC 0.0 0.0 - 0.2 %    Comment: Performed at [facility], [address] [place], [place] [id number]  Basic metabolic panel     Status: Abnormal   Collection Time: 07/11/19  4:11 AM  Result Value Ref Range   Sodium 131 (L) 135 - 145 mmol/L   Potassium 3.5 3.5 - 5.1 mmol/L   Chloride 96 (L) 98 - 111 mmol/L   CO2 27 22 - 32 mmol/L   Glucose, Bld 183 (H) 70 - 99 mg/dL    Comment: Glucose reference range applies only to samples taken after fasting for at least 8 hours.   BUN 10 8 - 23 mg/dL   Creatinine, Ser 0.45 (L) 0.61 - 1.24 mg/dL   Calcium 7.7 (L) 8.9 - 10.3 mg/dL   GFR calc non Af Amer >60 >60 mL/min   GFR calc Af Amer >60 >60 mL/min   Anion gap  8 5 - 15    Comment: Performed at [facility], [address] [place], [place] [address]  Glucose, capillary     Status: Abnormal   Collection Time: 07/11/19  8:03 AM  Result Value Ref Range   Glucose-Capillary 176 (H) 70 - 99 mg/dL    Comment: Glucose reference range applies only to samples taken after fasting for at least 8 hours.  Glucose, capillary     Status: Abnormal   Collection Time: 07/11/19 11:35 AM  Result Value Ref Range   Glucose-Capillary 149 (H) 70 - 99 mg/dL    Comment: Glucose reference range applies only to samples taken after fasting for at least 8 hours.  Glucose, capillary     Status: Abnormal   Collection Time: 07/11/19  4:20 PM  Result Value Ref Range   Glucose-Capillary 144 (H) 70 - 99 mg/dL    Comment: Glucose reference range applies only to samples taken after fasting for at least 8 hours.  Glucose, capillary     Status: Abnormal   Collection Time: 07/11/19  7:58 PM  Result Value Ref Range   Glucose-Capillary 140 (H) 70 - 99 mg/dL    Comment: Glucose reference range applies only to samples taken after fasting for at least 8 hours.   Comment 1 Notify RN    Comment 2 Document in Chart   Type and screen [facility]     Status: None   Collection Time: 07/11/19  8:25 PM  Result Value Ref Range   ABO/RH(D) O POS    Antibody Screen NEG    Sample Expiration 07/14/2019,2359    Unit Number [id number]    Blood Component Type RED CELLS,LR    Unit division 00    Status of Unit ISSUED,FINAL    Transfusion Status OK TO TRANSFUSE    Crossmatch Result      Compatible Performed at [facility], [address], [place], [place] [address]   Prepare RBC (crossmatch)     Status: None   Collection Time: 07/11/19  8:25 PM  Result Value Ref Range   Order Confirmation      ORDER PROCESSED BY BLOOD BANK Performed at [facility], [address]  [place], [place] [address]   ABO/Rh     Status: None   Collection Time: 07/11/19  8:25 PM  Result Value Ref Range    ABO/RH(D)      O POS Performed at [facility], [address] [place], [place] [address]   Glucose, capillary     Status: Abnormal   Collection Time: 07/11/19 11:50 PM  Result Value Ref Range   Glucose-Capillary 154 (H) 70 - 99 mg/dL    Comment: Glucose reference range applies only to samples taken after fasting for at least 8 hours.   Comment 1 Notify RN    Comment 2 Document in Chart   CBC     Status: Abnormal   Collection Time: 07/12/19  5:56 AM  Result Value Ref Range   WBC 2.3 (L) 4.0 - 10.5 K/uL   RBC 2.84 (L) 4.22 - 5.81 MIL/uL   Hemoglobin 8.6 (L) 13.0 - 17.0 g/dL   HCT 25.4 (L) 39.0 - 52.0 %   MCV 89.4 80.0 - 100.0 fL   MCH 30.3 26.0 - 34.0 pg   MCHC 33.9 30.0 - 36.0 g/dL   RDW 14.0 11.5 - 15.5 %   Platelets 133 (L) 150 - 400 K/uL    Comment: Immature Platelet Fraction may be clinically indicated, consider ordering this additional test [id number]    nRBC 0.0 0.0 - 0.2 %    Comment: Performed at [facility], [address] [place], [place] [id number]  Basic metabolic panel     Status: Abnormal   Collection Time: 07/12/19  5:56 AM  Result Value Ref Range   Sodium 131 (L) 135 - 145 mmol/L   Potassium 3.8 3.5 - 5.1 mmol/L   Chloride 96 (L) 98 - 111 mmol/L   CO2 26 22 - 32 mmol/L   Glucose, Bld 157 (H) 70 - 99 mg/dL    Comment: Glucose reference range applies only to samples taken after fasting for at least 8 hours.   BUN 10 8 - 23 mg/dL   Creatinine, Ser 0.47 (L) 0.61 - 1.24 mg/dL   Calcium 7.7 (L) 8.9 - 10.3 mg/dL   GFR calc non Af Amer >60 >60 mL/min   GFR calc Af Amer >60 >60 mL/min   Anion gap 9 5 - 15    Comment: Performed at [facility], [address] [place], [place] [address]  Glucose, capillary     Status: Abnormal   Collection Time: 07/12/19  6:13 AM  Result Value Ref Range   Glucose-Capillary 150 (H) 70 - 99 mg/dL    Comment: Glucose reference range applies only to samples taken after fasting for at least 8 hours.  Glucose, capillary     Status:  Abnormal   Collection Time: 07/12/19  7:35 AM  Result Value Ref Range   Glucose-Capillary 143 (H) 70 - 99 mg/dL    Comment: Glucose reference range applies only to samples taken after fasting for at least 8 hours.  Culture, blood (routine x 2)     Status: None (Preliminary result)   Collection Time: 07/12/19  9:47 AM   Specimen: BLOOD  Result Value Ref Range   Specimen Description BLOOD LEFT ANTECUBITAL    Special Requests      Blood Culture results may not be optimal due to an inadequate volume of blood received in culture bottles   Culture      NO GROWTH < 12 HOURS Performed at [facility], [address], [place], [place] [address]  Report Status PENDING   Culture, blood (routine x 2)     Status: None (Preliminary result)   Collection Time: 07/12/19  9:47 AM   Specimen: BLOOD LEFT HAND  Result Value Ref Range   Specimen Description BLOOD LEFT HAND    Special Requests      Blood Culture results may not be optimal due to an inadequate volume of blood received in culture bottles   Culture      NO GROWTH < 12 HOURS Performed at [facility], [address] [place], [place] [address]    Report Status PENDING   Glucose, capillary     Status: Abnormal   Collection Time: 07/12/19 11:24 AM  Result Value Ref Range   Glucose-Capillary 149 (H) 70 - 99 mg/dL    Comment: Glucose reference range applies only to samples taken after fasting for at least 8 hours.  Glucose, capillary     Status: Abnormal   Collection Time: 07/12/19  3:49 PM  Result Value Ref Range   Glucose-Capillary 129 (H) 70 - 99 mg/dL    Comment: Glucose reference range applies only to samples taken after fasting for at least 8 hours.   Comment 1 Notify RN    Comment 2 Document in Chart    ECHOCARDIOGRAM LIMITED  Result Date: 07/12/2019    ECHOCARDIOGRAM LIMITED REPORT   Patient Name:   Kurt Duran Date of Exam: 07/12/2019 Medical Rec #:  [id number]       Height:       73.0 in Accession #:    [id number]      Weight:        206.1 lb Date of Birth:  1946/01/20       BSA:          2.179 m Patient Age:    74 years        BP:           129/89 mmHg Patient Gender: M               HR:           84 bpm. Exam Location:  [facility] Procedure: Limited Echo Indications:    Bacteremia 790.7 / R78.81  History:        Patient has prior history of Echocardiogram examinations, most                 recent 05/10/2019. COPD; Risk Factors:Diabetes and Former Smoker.                 GERD, Sepsis, Septic shock , Neutropenic fever.  Sonographer:    Leavy Cella RDCS (AE) Referring Phys: [place]  1. Left ventricular ejection fraction, by estimation, is 55 to 60%. The left ventricle has normal function. The left ventricle has no regional wall motion abnormalities.  2. Right ventricular systolic function is normal. The right ventricular size is normal. There is normal pulmonary artery systolic pressure.  3. Trivial mitral valve regurgitation. No evidence of mitral stenosis.  4. Aortic valve poorly visualized. In the short access heavy shadowing from anular calcification, leaflets poorly visualized. In some views there is at least some suggestion of a density moving independently from the leaflets, possibly complex calcification but cannot exlcude a vegetation. If clinical suspicion for endocarditis consider TEE. There is no significant aortic valve dysfunction. . The aortic valve is abnormal. Aortic valve regurgitation is not visualized. No aortic stenosis is present.  5. Indeterminant PASP, inadequate TR jet.  6. The inferior vena cava is normal in size with greater than 50% respiratory variability, suggesting right atrial pressure of 3 mmHg. FINDINGS  Left Ventricle: Left ventricular ejection fraction, by estimation, is 55 to 60%. The left ventricle has normal function. The left ventricle has no regional wall motion abnormalities. Right Ventricle: The right ventricular size is normal. No increase in right ventricular wall  thickness. Right ventricular systolic function is normal. There is normal pulmonary artery systolic pressure. The tricuspid regurgitant velocity is 1.91 m/s, and  with an assumed right atrial pressure of 10 mmHg, the estimated right ventricular systolic pressure is 123456 mmHg. Mitral Valve: There is mild thickening of the mitral valve leaflet(s). There is mild calcification of the mitral valve leaflet(s). Mild mitral annular calcification. Trivial mitral valve regurgitation. No evidence of mitral valve stenosis. Tricuspid Valve: Tricuspid valve regurgitation is trivial. No evidence of tricuspid stenosis. Aortic Valve: Aortic valve poorly visualized. In the short access heavy shadowing from anular calcification, leaflets poorly visualized. In some views there is at least some suggestion of a density moving independently from the leaflets, possibly complex  calcification but cannot exlcude a vegetation. If clinical suspicion for endocarditis consider TEE. There is no significant aortic valve dysfunction. The aortic valve is abnormal. . There is mild thickening and mild calcification of the aortic valve. Aortic valve regurgitation is not visualized. No aortic stenosis is present. Mild aortic valve annular calcification. There is mild thickening of the aortic valve. There is mild calcification of the aortic valve. Pulmonic Valve: The pulmonic valve was normal in structure. Pulmonic valve regurgitation is not visualized. No evidence of pulmonic stenosis. Pulmonary Artery: Indeterminant PASP, inadequate TR jet. Venous: The inferior vena cava is normal in size with greater than 50% respiratory variability, suggesting right atrial pressure of 3 mmHg.  LEFT VENTRICLE PLAX 2D LVIDd:         4.45 cm LVIDs:         3.17 cm LV PW:         1.14 cm LV IVS:        0.86 cm LVOT diam:     2.20 cm LV SV:         75 LV SV Index:   34 LVOT Area:     3.80 cm  LEFT ATRIUM         Index LA diam:    3.40 cm 1.56 cm/m  AORTIC VALVE LVOT  Vmax:   106.00 cm/s LVOT Vmean:  54.500 cm/s LVOT VTI:    0.197 m  AORTA Ao Root diam: 3.20 cm MITRAL VALVE                TRICUSPID VALVE MV Area (PHT): 3.40 cm     TR Peak grad:   14.6 mmHg MV Decel Time: 223 msec     TR Vmax:        191.00 cm/s MV E velocity: 105.00 cm/s MV A velocity: 72.40 cm/s   SHUNTS MV E/A ratio:  1.45         Systemic VTI:  0.20 m                             Systemic Diam: 2.20 cm Carlyle Dolly MD Electronically signed by Carlyle Dolly MD Signature Date/Time: 07/12/2019/11:22:45 AM    Final      MEDICATIONS: I have reviewed the patient's current medications.     Assessment/Plan:  1.  Esophageal adenocarcinoma: -  Full dose of weekly carboplatin and paclitaxel with radiation therapy from 06/15/2019 through 07/06/2019. -Currently treatment is on hold.  Plan is to resume therapy once infection issues resolve.  2.  Sepsis with Streptococcus Constellatus: -Blood cultures grew Streptococcus constellatus. -Plan is to discontinue Port-A-Cath.  He will have a PICC line placed for antibiotics. -Reviewed the echocardiogram from today shows EF 55 to 60%.  Aortic valve is poorly visualized.  In some views there is some suggestion of density moving independently from the leaflets, cannot exclude vegetation. -I have recommended a TEE as it would help decide the duration of antibiotics and prevent delaying treatment for his malignancy.  3.  Pancytopenia: -CBC today shows white count 2.3.  Hemoglobin improved to 8.6 after 1 unit of PRBC yesterday.  Platelet count is 133.  4.  Nutrition: -Tubes feeds have been restarted with Osmolite 1.5, continuous 3 PM to 7 AM.  5.  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  Slightly improved today.  All questions were answered. The patient knows to call the clinic with any problems, questions or concerns. We can certainly see the patient much sooner if necessary.     Derek Jack

## 2019-07-12 NOTE — TOC Transition Note (Signed)
Transition of Care [facility]) - CM/SW Discharge Note   Patient Details  Name: BERNE HARTPENCE MRN: [id number] Date of Birth: 03-30-1946  Transition of Care [facility]) CM/SW Contact:  Ihor Gully, LCSW Phone Number: 07/12/2019, 4:47 PM   Clinical Narrative:    Patient from home with spouse. At baseline ambulates with a cane. Can drive but has not driven since CA dx. In Feb. Mrs. Der is agreeable to SNF. SNF choices discussed and referrals sent to choices.    Final next level of care: Skilled Nursing Facility Barriers to Discharge: Continued Medical Work up   Patient Goals and CMS Choice     Choice offered to / list presented to : Spouse  Discharge Placement                       Discharge Plan and Services                                     Social Determinants of Health (SDOH) Interventions     Readmission Risk Interventions No flowsheet data found.

## 2019-07-12 NOTE — Progress Notes (Signed)
*  PRELIMINARY RESULTS* Echocardiogram 2D Echocardiogram has been performed.  Leavy Cella 07/12/2019, 9:23 AM

## 2019-07-12 NOTE — Progress Notes (Addendum)
Educated patient on the signs and symptoms of blood transfusion reaction; he verbalized understanding and will notify the Nurse if any s/sx. Blood consent has been signed. Pre-BT V/S has been checked and recorded.

## 2019-07-12 NOTE — NC FL2 (Signed)
[place] MEDICAID FL2 LEVEL OF CARE SCREENING TOOL     IDENTIFICATION  Patient Name: Kurt Duran Birthdate: 1946-04-02 Sex: male Admission Date (Current Location): 07/09/2019  [facility] and [place] Number:  [employer] and Address:  [facility] [address], [place]      Provider Number: [id number]  Attending Physician Name and Address:  Kathie Dike, MD  Relative Name and Phone Number:       Current Level of Care: Hospital Recommended Level of Care: [place] Prior Approval Number:    Date Approved/Denied:   PASRR Number:    Discharge Plan: SNF    Current Diagnoses: Patient Active Problem List   Diagnosis Date Noted  . Hematemesis without nausea   . Sepsis ([place]) 07/09/2019  . S/P percutaneous endoscopic gastrostomy (PEG) tube placement ([place]) 07/09/2019  . Neutropenic fever ([place]) 07/09/2019  . Hyponatremia 07/09/2019  . Hypomagnesemia 07/09/2019  . Pancytopenia ([place]) 07/09/2019  . Septic shock ([place]) 07/09/2019  . Dehydration 07/06/2019  . Port-A-Cath in place 06/07/2019  . Protein-calorie malnutrition, severe 05/13/2019  . 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 Esophageal mass-Lower 3rd   . COPD (chronic obstructive pulmonary disease) ([place]) 05/09/2019  . DM2 (diabetes mellitus, type 2) ([place]) 05/09/2019  . GERD (gastroesophageal reflux disease) 05/09/2019  . Intractable vomiting 05/09/2019  . HTN (hypertension) 05/09/2019  . Gout 05/09/2019  . Intractable nausea and vomiting 05/09/2019    Orientation RESPIRATION BLADDER Height & Weight     Self, Time, Situation, Place  Normal Continent Weight: 206 lb 2.1 oz (93.5 kg) Height:  6\' 1"  (185.4 cm)  BEHAVIORAL SYMPTOMS/MOOD NEUROLOGICAL BOWEL NUTRITION STATUS      Continent Feeding tube  AMBULATORY STATUS COMMUNICATION OF NEEDS Skin   Limited Assist Verbally Normal                       Personal Care Assistance Level of Assistance   Bathing, Dressing, Feeding Bathing Assistance: Limited assistance Feeding assistance: (PEG TUBE) Dressing Assistance: Limited assistance     Functional Limitations Info  Sight, Hearing, Speech Sight Info: Adequate Hearing Info: Adequate Speech Info: Adequate    SPECIAL CARE FACTORS FREQUENCY  PT (By licensed PT)     PT Frequency: 5x/week              Contractures Contractures Info: Not present    Additional Factors Info  Psychotropic Code Status Info: DNR Allergies Info: Eggs or Egg-derived products Psychotropic Info: xanax         Current Medications (07/12/2019):  This is the current hospital active medication list Current Facility-Administered Medications  Medication Dose Route Frequency Provider Last Rate Last Admin  . 0.9 %  sodium chloride infusion  1,000 mL Intravenous Continuous Kathie Dike, MD 75 mL/hr at 07/12/19 0323 Rate Verify at 07/12/19 0323  . acetaminophen (TYLENOL) tablet 650 mg  650 mg Oral Q6H PRN Kathie Dike, MD   650 mg at 07/10/19 [id number]   Or  . acetaminophen (TYLENOL) suppository 650 mg  650 mg Rectal Q6H PRN Kathie Dike, MD   650 mg at 07/09/19 1839  . albuterol (PROVENTIL) (2.5 MG/3ML) 0.083% nebulizer solution 2.5 mg  2.5 mg Nebulization Q6H PRN Kathie Dike, MD      . ALPRAZolam Duanne Moron) tablet 0.5 mg  0.5 mg Per J Tube BID Kathie Dike, MD   Stopped at 07/10/19 2139  . cefTRIAXone (ROCEPHIN) 2 g in sodium chloride 0.9 % 100 mL  IVPB  2 g Intravenous Q24H Kathie Dike, MD 200 mL/hr at 07/11/19 1747 2 g at 07/11/19 1747  . Chlorhexidine Gluconate Cloth 2 % PADS 6 each  6 each Topical Daily Kathie Dike, MD   6 each at 07/11/19 [phone]  . famotidine (PEPCID) tablet 20 mg  20 mg Per Tube Daily Kathie Dike, MD   Stopped at 07/11/19 1120  . feeding supplement (OSMOLITE 1.5 CAL) liquid 1,000 mL  1,000 mL Per Tube Continuous Kathie Dike, MD      . feeding supplement (PRO-STAT SUGAR FREE 64) liquid 30 mL  30 mL Per J Tube BID  Kathie Dike, MD   Stopped at 07/10/19 2140  . FLUoxetine (PROZAC) capsule 20 mg  20 mg Per Tube Daily Kathie Dike, MD   Stopped at 07/11/19 1121  . free water 300 mL  300 mL Per Tube Q8H Kathie Dike, MD   Stopped at 07/10/19 2141  . HYDROcodone-homatropine (HYCODAN) 5-1.5 MG/5ML syrup 5 mL  5 mL Per Tube Q6H Kathie Dike, MD   Stopped at 07/11/19 0217  . insulin aspart (novoLOG) injection 0-15 Units  0-15 Units Subcutaneous Q4H Kathie Dike, MD   2 Units at 07/12/19 1314  . morphine 2 MG/ML injection 2 mg  2 mg Intravenous Q4H PRN Kathie Dike, MD   2 mg at 07/11/19 2223  . norepinephrine (LEVOPHED) 4mg  in 274mL premix infusion  0-40 mcg/min Intravenous Titrated Kathie Dike, MD   Stopped at 07/09/19 1530  . ondansetron (ZOFRAN) tablet 4 mg  4 mg Oral Q6H PRN Kathie Dike, MD       Or  . ondansetron (ZOFRAN) injection 4 mg  4 mg Intravenous Q6H PRN Kathie Dike, MD   4 mg at 07/10/19 1318  . ondansetron (ZOFRAN) injection 4 mg  4 mg Intravenous Q6H Kathie Dike, MD   4 mg at 07/12/19 1315  . pantoprazole (PROTONIX) injection 40 mg  40 mg Intravenous Q12H Kathie Dike, MD   40 mg at 07/12/19 1018  . potassium chloride (KLOR-CON) packet 40 mEq  40 mEq Per Tube Once Kathie Dike, MD   Stopped at 07/11/19 1121     Discharge Medications: Please see discharge summary for a list of discharge medications.  Relevant Imaging Results:  Relevant Lab Results:   Additional Information SSN [id number].  Shomari Matusik, Clydene Pugh, LCSW

## 2019-07-12 NOTE — Progress Notes (Signed)
PROGRESS NOTE    MANUAL BROAS  [id number] DOB: [birth year] DOA: 07/09/2019 PCP: Glenda Chroman, MD    Brief Narrative:   Kurt Duran is a 74 y.o. male with medical history significant of esophageal cancer who is receiving chemotherapy as well as radiation treatments.  Patient has an obstructive lesion and is PEG tube for nutrition/meds.  His last chemotherapy was on 3/31.  Reports of the past 10 days, he has had increasing secretions and cough.  He was prescribed scopolamine patch which caused dry mouth, but did not improve his respiratory secretions.  He has been feeling generally weak and unwell.  He came to the ER yesterday wife reports that his temperature was 100.16F prior to the visit.  During his ER stay, his labs were reassuring and he felt better and therefore discharged home.  Upon returning home, he had worsening fever this morning developed chills.  He was brought back to the emergency room where he was found to have a temperature of 104F.  Is also noted to be hypotensive and tachycardic.  Lactic acid markedly elevated over 4.  He was started on IV fluids per sepsis protocol.  CT of the abdomen pelvis as well as chest did not show any source of sepsis.  Coronavirus test was also negative.  He was noted to be neutropenic.  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 Severe sepsis with septic shock.  Patient was noted to be hypotensive, febrile and tachycardic on arrival.  He was also neutropenic. 2 sets of blood cultures positive for strep species.  Currently on intravenous  ceftriaxone.  He received IV fluid boluses per sepsis protocol and briefly required vasopressors. Lactic acid trended down.  He was able to wean off of vasopressors.  Hemodynamic status has stabilized. 2. Strep bacteremia.  Currently on IV ceftriaxone.  Echocardiogram ordered cannot rule out vegetation on aortic valve.  Discussed with infectious disease, Dr. Johnnye Sima recommended 6 weeks of IV antibiotics.  It was also recommended that port be removed.  General surgery consulted. 3. Febrile neutropenia.    Related to bacteremia.  WBC count is trending back up. 4. Hyponatremia.  Related to volume depletion.    Improving with IV hydration. 5. Pancytopenia.  Likely related to recent chemotherapy.    Patient was transfused 1 unit PRBC on 4/5. Follow-up hemoglobin has improved.  Continue to treat supportively and monitor. 6. Diabetes. Patient was on glipizide prior to admission.  Will hold for now and continue on sliding scale insulin.  Blood sugars have been stable 7. Esophageal adenocarcinoma.  He is followed by oncology for chemotherapy and radiation treatments.  Plans are to follow-up with cardiothoracic surgery as an outpatient to consider resection at some point.  Continue PEG tube feedings 8. GERD.  Continue on PPI and H2 blockers. 9. GI bleeding.  Patient began to have hematemesis after admission.  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  GI following.  Continue on PPI.  Appears to have resolved   DVT prophylaxis: SCDs Code Status: DNR Family Communication: Discussed with  wife at the bedside Disposition Plan: Patient is from home.  Plan will be to discharge to skilled nursing facility.  Barriers to discharge at this time are continue treatment with IV antibiotics for sepsis/febrile neutropenia, removal of port   Consultants:   Gastroenterology  Oncology  Procedures:   Echo:1. Left ventricular ejection fraction, by estimation, is 55 to 60%. The  left ventricle has normal function.  The left ventricle has no regional  wall motion abnormalities.  2. Right ventricular systolic function is normal. The right ventricular  size is normal. There is normal pulmonary artery systolic pressure.  3. Trivial mitral valve regurgitation. No evidence of mitral stenosis.  4. Aortic valve poorly visualized. In the short access heavy shadowing  from anular calcification, leaflets poorly visualized. In some views there  is at least some suggestion of a density moving independently from the  leaflets, possibly complex  calcification but cannot exlcude a vegetation. If clinical suspicion for  endocarditis consider TEE. There is no significant aortic valve  dysfunction. . The aortic valve is abnormal. Aortic valve regurgitation is  not visualized. No aortic stenosis is  present.  5. Indeterminant PASP, inadequate TR jet.  6. The inferior vena cava is normal in size with greater than 50%  respiratory variability, suggesting right atrial pressure of 3 mmHg.   Antimicrobials:   Cefepime 4/3> 4/5  Ceftriaxone 4/5 >   Subjective: Feels better today.  Continues to be spitting up secretions, but feels are less bloody today.  Objective: Vitals:   07/12/19 0220 07/12/19 0616 07/12/19 1131 07/12/19 1459  BP: 131/86 129/89  121/74  Pulse: 87 84  78  Resp: 16 16  18   Temp: 98.1 F (36.7 C) 98.3 F (36.8 C)  98 F (36.7 C)  TempSrc: Oral Oral    SpO2: 95% 94% 95% 96%  Weight:      Height:        Intake/Output Summary (Last 24 hours) at 07/12/2019 1935 Last data filed at 07/12/2019 1900 Gross per 24 hour  Intake 1519.23 ml  Output 2000 ml  Net -480.77 ml   Filed Weights   07/09/19 0829 07/09/19 1414 07/10/19 0500  Weight: 89 kg 93.7 kg 93.5 kg    Examination:  General exam: Alert, awake, oriented x 3 Respiratory system: Clear to auscultation. Respiratory effort normal. Cardiovascular system:RRR. No murmurs, rubs, gallops.  Port in right chest Gastrointestinal system:  Abdomen is nondistended, soft and nontender. No organomegaly or masses felt. Normal bowel sounds heard.  Feeling tired in place Central nervous system: Alert and oriented. No focal neurological deficits. Extremities: No C/C/E, +pedal pulses Skin: No rashes, lesions or ulcers Psychiatry: Judgement and insight appear normal. Mood & affect appropriate.     Data Reviewed: I have personally reviewed following labs and imaging studies  CBC: Recent Labs  Lab 07/06/19 0852 07/06/19 0852 07/08/19 0317 07/08/19 0317 07/09/19 0844 07/09/19 0844 07/10/19 0431 07/10/19 1400 07/10/19 2044 07/11/19 0411 07/12/19 0556  WBC 3.2*   < > 3.7*   < > 0.8*  --  2.1*  --  1.9* 2.0* 2.3*  NEUTROABS 2.7  --  3.3  --  0.7*  --  1.7  --   --   --   --   HGB 9.5*   < > 8.6*   < > 8.6*   < > 7.4* 7.2* 7.1* 7.1* 8.6*  HCT 27.7*   < > 25.5*   < > 25.4*   < > 22.1* 21.7* 20.9*  21.5* 25.4*  MCV 89.1   < > 88.9   < > 89.4  --  90.2  --  90.9 90.3 89.4  PLT 142*   < > 120*   < > 126*  --  113*  --  122* 118* 133*   < > = values in this interval not displayed.   Basic Metabolic Panel: Recent Labs  Lab 07/06/19 0852 07/06/19 0852 07/08/19 0317 07/09/19 0844 07/10/19 0431 07/11/19 0411 07/12/19 0556  NA 129*   < > 129* 126* 130* 131* 131*  K 3.9   < > 4.3 3.7 3.9 3.5 3.8  CL 90*   < > 90* 90* 96* 96* 96*  CO2 27   < > 25 23 27 27 26   GLUCOSE 178*   < > 196* 267* 263* 183* 157*  BUN 11   < > 13 14 14 10 10   CREATININE 0.51*   < > 0.52* 0.61 0.49* 0.45* 0.47*  CALCIUM 8.2*   < > 7.6* 7.8* 7.7* 7.7* 7.7*  MG 1.6*  --   --  1.6* 1.7  --   --    < > = values in this interval not displayed.   GFR: Estimated Creatinine Clearance: 95.7 mL/min (A) (by C-G formula based on SCr of 0.47 mg/dL (L)). Liver Function Tests: Recent Labs  Lab 07/06/19 0852 07/08/19 0317 07/09/19 0844 07/10/19 0431  AST 21 20 28 15   ALT 27 23 24 18   ALKPHOS 88 85 111 90  BILITOT 0.4 0.4 0.6 0.3  PROT 5.8* 5.8* 5.6* 4.9*    ALBUMIN 2.6* 2.5* 2.4* 2.1*   No results for input(s): LIPASE, AMYLASE in the last 168 hours. No results for input(s): AMMONIA in the last 168 hours. Coagulation Profile: Recent Labs  Lab 07/09/19 0844  INR 1.2   Cardiac Enzymes: No results for input(s): CKTOTAL, CKMB, CKMBINDEX, TROPONINI in the last 168 hours. BNP (last 3 results) No results for input(s): PROBNP in the last 8760 hours. HbA1C: Recent Labs    07/10/19 0431  HGBA1C 8.0*   CBG: Recent Labs  Lab 07/11/19 2350 07/12/19 0613 07/12/19 0735 07/12/19 1124 07/12/19 1549  GLUCAP 154* 150* 143* 149* 129*   Lipid Profile: No results for input(s): CHOL, HDL, LDLCALC, TRIG, CHOLHDL, LDLDIRECT in the last 72 hours. Thyroid Function Tests: No results for input(s): TSH, T4TOTAL, FREET4, T3FREE, THYROIDAB in the last 72 hours. Anemia Panel: No results for input(s): VITAMINB12, FOLATE, FERRITIN, TIBC, IRON, RETICCTPCT in the last 72 hours. Sepsis Labs: Recent Labs  Lab 07/09/19 0844 07/09/19 1052 07/09/19 1322 07/09/19 1602  LATICACIDVEN 4.6* 5.0* 3.3* 2.7*    Recent Results (from the past 240 hour(s))  Blood Culture (routine x 2)     Status: Abnormal (Preliminary result)   Collection Time: 07/09/19  8:44 AM   Specimen: BLOOD RIGHT FOREARM  Result Value Ref Range Status   Specimen Description   Final    BLOOD RIGHT FOREARM BOTTLES DRAWN AEROBIC AND ANAEROBIC Performed at [facility], [address] [place], [place] [address]    Special Requests   Final    Blood Culture adequate volume Performed at [facility], [address], [place], [place] [address]    Culture  Setup Time   Final    GRAM POSITIVE COCCI IN CHAINS ANAEROBIC BOTTLE ONLY CRITICAL RESULT CALLED TO, READ BACK BY AND VERIFIED WITH: RN R BOUNDURANT [id number] AT 10 AM BY CM    Culture (A)  Final  STREPTOCOCCUS CONSTELLATUS SUSCEPTIBILITIES TO FOLLOW Performed at [facility], [place] [address] [place], [place] [address]    Report  Status PENDING  Incomplete  Blood Culture (routine x 2)     Status: None (Preliminary result)   Collection Time: 07/09/19  8:44 AM   Specimen: Right Antecubital; Blood  Result Value Ref Range Status   Specimen Description   Final    RIGHT ANTECUBITAL BOTTLES DRAWN AEROBIC AND ANAEROBIC Performed at [facility], [address] [place], [place] [address]    Special Requests   Final    Blood Culture adequate volume Performed at [facility], [address] [place], [place] [address]    Culture  Setup Time   Final    GRAM POSITIVE COCCI Gram Stain Report Called to,Read Back By and Verified With: BONDURANT,R @ [id number] ON 07/11/19 BY JUW AEROBIC BOTTLE ONLY GS DONE @ [facility] GRAM POSITIVE COCCI GRAM POSITIVE RODS Gram Stain Report Called to,Read Back By and Verified With:  MARY HOWARTON,RN @[id number]  07/11/2019 KAY ANAEROBIC BOTTLE ONLY Performed at [facility], [address], [place], [place] [address]    Culture   Final    TOO YOUNG TO READ Performed at [facility], [place] [address] [place], [place] [address]    Report Status PENDING  Incomplete  Blood Culture ID Panel (Reflexed)     Status: Abnormal   Collection Time: 07/09/19  8:44 AM  Result Value Ref Range Status   Enterococcus species NOT DETECTED NOT DETECTED Final   Listeria monocytogenes NOT DETECTED NOT DETECTED Final   Staphylococcus species NOT DETECTED NOT DETECTED Final   Staphylococcus aureus (BCID) NOT DETECTED NOT DETECTED Final   Streptococcus species DETECTED (A) NOT DETECTED Final    Comment: Not Enterococcus species, Streptococcus agalactiae, Streptococcus pyogenes, or Streptococcus pneumoniae. CRITICAL RESULT CALLED TO, READ BACK BY AND VERIFIED WITH: RN R BONDURANT [id number] AT 81 AM BY CM    Streptococcus agalactiae NOT DETECTED NOT DETECTED Final   Streptococcus pneumoniae NOT DETECTED NOT DETECTED Final   Streptococcus pyogenes NOT DETECTED NOT DETECTED Final   Acinetobacter baumannii NOT DETECTED NOT DETECTED  Final   Enterobacteriaceae species NOT DETECTED NOT DETECTED Final   Enterobacter cloacae complex NOT DETECTED NOT DETECTED Final   Escherichia coli NOT DETECTED NOT DETECTED Final   Klebsiella oxytoca NOT DETECTED NOT DETECTED Final   Klebsiella pneumoniae NOT DETECTED NOT DETECTED Final   Proteus species NOT DETECTED NOT DETECTED Final   Serratia marcescens NOT DETECTED NOT DETECTED Final   Haemophilus influenzae NOT DETECTED NOT DETECTED Final   Neisseria meningitidis NOT DETECTED NOT DETECTED Final   Pseudomonas aeruginosa NOT DETECTED NOT DETECTED Final   Candida albicans NOT DETECTED NOT DETECTED Final   Candida glabrata NOT DETECTED NOT DETECTED Final   Candida krusei NOT DETECTED NOT DETECTED Final   Candida parapsilosis NOT DETECTED NOT DETECTED Final   Candida tropicalis NOT DETECTED NOT DETECTED Final    Comment: Performed at [facility], [place] [address], [place], [place] [address]  Urine culture     Status: None   Collection Time: 07/09/19  8:50 AM   Specimen: Urine, Catheterized  Result Value Ref Range Status   Specimen Description   Final    URINE, CATHETERIZED Performed at [facility], [address] [place], [place] [address]    Special Requests   Final    NONE Performed at [facility], [address] [place], [place] [address]    Culture  Final    NO GROWTH Performed at [facility], [place] [address] [place], [place] [address]    Report Status 07/10/2019 FINAL  Final  SARS CORONAVIRUS 2 (TAT 6-24 HRS) Nasopharyngeal Nasopharyngeal Swab     Status: None   Collection Time: 07/09/19  9:08 AM   Specimen: Nasopharyngeal Swab  Result Value Ref Range Status   SARS Coronavirus 2 NEGATIVE NEGATIVE Final    Comment: (NOTE) SARS-CoV-2 target nucleic acids are NOT DETECTED. The SARS-CoV-2 RNA is generally detectable in upper and lower respiratory specimens during the acute phase of infection. Negative results do not preclude SARS-CoV-2 infection, do not  rule out co-infections with other pathogens, and should not be used as the sole basis for treatment or other patient management decisions. Negative results must be combined with clinical observations, patient history, and epidemiological information. The expected result is Negative. Fact Sheet for Patients: SugarRoll.be Fact Sheet for Healthcare Providers: https://www.woods-mathews.com/ This test is not yet approved or cleared by the [place] FDA and  has been authorized for detection and/or diagnosis of SARS-CoV-2 by FDA under an Emergency Use Authorization (EUA). This EUA will remain  in effect (meaning this test can be used) for the duration of the COVID-19 declaration under Section 56 4(b)(1) of the Act, 21 U.S.C. section 360bbb-3(b)(1), unless the authorization is terminated or revoked sooner. Performed at [facility], [place] [address], [place],  [address]   Respiratory Panel by RT PCR (Flu A&B, Covid) - Nasopharyngeal Swab     Status: None   Collection Time: 07/09/19 11:45 AM   Specimen: Nasopharyngeal Swab  Result Value Ref Range Status   SARS Coronavirus 2 by RT PCR NEGATIVE NEGATIVE Final    Comment: (NOTE) SARS-CoV-2 target nucleic acids are NOT DETECTED. The SARS-CoV-2 RNA is generally detectable in upper respiratoy specimens during the acute phase of infection. The lowest concentration of SARS-CoV-2 viral copies this assay can detect is 131 copies/mL. A negative result does not preclude SARS-Cov-2 infection and should not be used as the sole basis for treatment or other patient management decisions. A negative result may occur with  improper specimen collection/handling, submission of specimen other than nasopharyngeal swab, presence of viral mutation(s) within the areas targeted by this assay, and inadequate number of viral copies (<131 copies/mL). A negative result must be combined with clinical observations,  patient history, and epidemiological information. The expected result is Negative. Fact Sheet for Patients:  PinkCheek.be Fact Sheet for Healthcare Providers:  GravelBags.it This test is not yet ap proved or cleared by the [place] FDA and  has been authorized for detection and/or diagnosis of SARS-CoV-2 by FDA under an Emergency Use Authorization (EUA). This EUA will remain  in effect (meaning this test can be used) for the duration of the COVID-19 declaration under Section 564(b)(1) of the Act, 21 U.S.C. section 360bbb-3(b)(1), unless the authorization is terminated or revoked sooner.    Influenza A by PCR NEGATIVE NEGATIVE Final   Influenza B by PCR NEGATIVE NEGATIVE Final    Comment: (NOTE) The Xpert Xpress SARS-CoV-2/FLU/RSV assay is intended as an aid in  the diagnosis of influenza from Nasopharyngeal swab specimens and  should not be used as a sole basis for treatment. Nasal washings and  aspirates are unacceptable for Xpert Xpress SARS-CoV-2/FLU/RSV  testing. Fact Sheet for Patients: PinkCheek.be Fact Sheet for Healthcare Providers: GravelBags.it This test is not yet approved or cleared by the [place] FDA and  has been authorized for detection  and/or diagnosis of SARS-CoV-2 by  FDA under an Emergency Use Authorization (EUA). This EUA will remain  in effect (meaning this test can be used) for the duration of the  Covid-19 declaration under Section 564(b)(1) of the Act, 21  U.S.C. section 360bbb-3(b)(1), unless the authorization is  terminated or revoked. Performed at [facility], [address], [place], [place] [address]   MRSA PCR Screening     Status: None   Collection Time: 07/09/19  3:09 PM   Specimen: Nasal Mucosa; Nasopharyngeal  Result Value Ref Range Status   MRSA by PCR NEGATIVE NEGATIVE Final    Comment:        The GeneXpert MRSA Assay  (FDA approved for NASAL specimens only), is one component of a comprehensive MRSA colonization surveillance program. It is not intended to diagnose MRSA infection nor to guide or monitor treatment for MRSA infections. Performed at [facility] [address], [place], [place] [address]   Culture, blood (routine x 2)     Status: None (Preliminary result)   Collection Time: 07/12/19  9:47 AM   Specimen: BLOOD  Result Value Ref Range Status   Specimen Description BLOOD LEFT ANTECUBITAL  Final   Special Requests   Final    Blood Culture results may not be optimal due to an inadequate volume of blood received in culture bottles   Culture   Final    NO GROWTH < 12 HOURS Performed at [facility], [address], [place], [place] [address]    Report Status PENDING  Incomplete  Culture, blood (routine x 2)     Status: None (Preliminary result)   Collection Time: 07/12/19  9:47 AM   Specimen: BLOOD LEFT HAND  Result Value Ref Range Status   Specimen Description BLOOD LEFT HAND  Final   Special Requests   Final    Blood Culture results may not be optimal due to an inadequate volume of blood received in culture bottles   Culture   Final    NO GROWTH < 12 HOURS Performed at [facility], [address] [place], [place] [address]    Report Status PENDING  Incomplete         Radiology Studies: ECHOCARDIOGRAM LIMITED  Result Date: 07/12/2019    ECHOCARDIOGRAM LIMITED REPORT   Patient Name:   Kurt Duran Date of Exam: 07/12/2019 Medical Rec #:  [id number]       Height:       73.0 in Accession #:    [id number]      Weight:       206.1 lb Date of Birth:  1945-10-04       BSA:          2.179 m Patient Age:    58 years        BP:           129/89 mmHg Patient Gender: M               HR:           84 bpm. Exam Location:  [facility] Procedure: Limited Echo Indications:    Bacteremia 790.7 / R78.81  History:        Patient has prior history of Echocardiogram examinations, most                  recent 05/10/2019. COPD; Risk Factors:Diabetes and Former Smoker.                 GERD,  Sepsis, Septic shock , Neutropenic fever.  Sonographer:    Leavy Cella RDCS (AE) Referring Phys: [place]  1. Left ventricular ejection fraction, by estimation, is 55 to 60%. The left ventricle has normal function. The left ventricle has no regional wall motion abnormalities.  2. Right ventricular systolic function is normal. The right ventricular size is normal. There is normal pulmonary artery systolic pressure.  3. Trivial mitral valve regurgitation. No evidence of mitral stenosis.  4. Aortic valve poorly visualized. In the short access heavy shadowing from anular calcification, leaflets poorly visualized. In some views there is at least some suggestion of a density moving independently from the leaflets, possibly complex calcification but cannot exlcude a vegetation. If clinical suspicion for endocarditis consider TEE. There is no significant aortic valve dysfunction. . The aortic valve is abnormal. Aortic valve regurgitation is not visualized. No aortic stenosis is present.  5. Indeterminant PASP, inadequate TR jet.  6. The inferior vena cava is normal in size with greater than 50% respiratory variability, suggesting right atrial pressure of 3 mmHg. FINDINGS  Left Ventricle: Left ventricular ejection fraction, by estimation, is 55 to 60%. The left ventricle has normal function. The left ventricle has no regional wall motion abnormalities. Right Ventricle: The right ventricular size is normal. No increase in right ventricular wall thickness. Right ventricular systolic function is normal. There is normal pulmonary artery systolic pressure. The tricuspid regurgitant velocity is 1.91 m/s, and  with an assumed right atrial pressure of 10 mmHg, the estimated right ventricular systolic pressure is 123456 mmHg. Mitral Valve: There is mild thickening of the mitral valve leaflet(s). There is mild calcification  of the mitral valve leaflet(s). Mild mitral annular calcification. Trivial mitral valve regurgitation. No evidence of mitral valve stenosis. Tricuspid Valve: Tricuspid valve regurgitation is trivial. No evidence of tricuspid stenosis. Aortic Valve: Aortic valve poorly visualized. In the short access heavy shadowing from anular calcification, leaflets poorly visualized. In some views there is at least some suggestion of a density moving independently from the leaflets, possibly complex  calcification but cannot exlcude a vegetation. If clinical suspicion for endocarditis consider TEE. There is no significant aortic valve dysfunction. The aortic valve is abnormal. . There is mild thickening and mild calcification of the aortic valve. Aortic valve regurgitation is not visualized. No aortic stenosis is present. Mild aortic valve annular calcification. There is mild thickening of the aortic valve. There is mild calcification of the aortic valve. Pulmonic Valve: The pulmonic valve was normal in structure. Pulmonic valve regurgitation is not visualized. No evidence of pulmonic stenosis. Pulmonary Artery: Indeterminant PASP, inadequate TR jet. Venous: The inferior vena cava is normal in size with greater than 50% respiratory variability, suggesting right atrial pressure of 3 mmHg.  LEFT VENTRICLE PLAX 2D LVIDd:         4.45 cm LVIDs:         3.17 cm LV PW:         1.14 cm LV IVS:        0.86 cm LVOT diam:     2.20 cm LV SV:         75 LV SV Index:   34 LVOT Area:     3.80 cm  LEFT ATRIUM         Index LA diam:    3.40 cm 1.56 cm/m  AORTIC VALVE LVOT Vmax:   106.00 cm/s LVOT Vmean:  54.500 cm/s LVOT VTI:    0.197 m  AORTA Ao Root  diam: 3.20 cm MITRAL VALVE                TRICUSPID VALVE MV Area (PHT): 3.40 cm     TR Peak grad:   14.6 mmHg MV Decel Time: 223 msec     TR Vmax:        191.00 cm/s MV E velocity: 105.00 cm/s MV A velocity: 72.40 cm/s   SHUNTS MV E/A ratio:  1.45         Systemic VTI:  0.20 m                              Systemic Diam: 2.20 cm Carlyle Dolly MD Electronically signed by Carlyle Dolly MD Signature Date/Time: 07/12/2019/11:22:45 AM    Final         Scheduled Meds: . ALPRAZolam  0.5 mg Per J Tube BID  . Chlorhexidine Gluconate Cloth  6 each Topical Daily  . famotidine  20 mg Per Tube Daily  . feeding supplement (PRO-STAT SUGAR FREE 64)  30 mL Per J Tube BID  . FLUoxetine  20 mg Per Tube Daily  . free water  300 mL Per Tube Q8H  . HYDROcodone-homatropine  5 mL Per Tube Q6H  . insulin aspart  0-15 Units Subcutaneous Q4H  . ondansetron (ZOFRAN) IV  4 mg Intravenous Q6H  . pantoprazole (PROTONIX) IV  40 mg Intravenous Q12H  . potassium chloride  40 mEq Per Tube Once   Continuous Infusions: . sodium chloride 75 mL/hr at 07/12/19 0323  . cefTRIAXone (ROCEPHIN)  IV 2 g (07/12/19 1825)  . feeding supplement (OSMOLITE 1.5 CAL)    . norepinephrine (LEVOPHED) Adult infusion Stopped (07/09/19 1530)     LOS: 3 days    Time spent: 12mins    Kathie Dike, MD [facility]   If 7PM-7AM, please contact night-coverage [email or web address]  07/12/2019, 7:35 PM

## 2019-07-13 ENCOUNTER — Ambulatory Visit (HOSPITAL_COMMUNITY): Payer: Medicare Other | Admitting: Hematology

## 2019-07-13 ENCOUNTER — Inpatient Hospital Stay (HOSPITAL_COMMUNITY): Payer: Medicare Other

## 2019-07-13 ENCOUNTER — Ambulatory Visit (HOSPITAL_COMMUNITY): Payer: Medicare Other

## 2019-07-13 ENCOUNTER — Encounter (HOSPITAL_COMMUNITY): Payer: Self-pay | Admitting: Internal Medicine

## 2019-07-13 ENCOUNTER — Other Ambulatory Visit (HOSPITAL_COMMUNITY): Payer: Medicare Other

## 2019-07-13 DIAGNOSIS — R7881 Bacteremia: Secondary | ICD-10-CM

## 2019-07-13 LAB — CBC
HCT: 26.8 % — ABNORMAL LOW (ref 39.0–52.0)
Hemoglobin: 9 g/dL — ABNORMAL LOW (ref 13.0–17.0)
MCH: 29.6 pg (ref 26.0–34.0)
MCHC: 33.6 g/dL (ref 30.0–36.0)
MCV: 88.2 fL (ref 80.0–100.0)
Platelets: 141 10*3/uL — ABNORMAL LOW (ref 150–400)
RBC: 3.04 MIL/uL — ABNORMAL LOW (ref 4.22–5.81)
RDW: 13.8 % (ref 11.5–15.5)
WBC: 2.2 10*3/uL — ABNORMAL LOW (ref 4.0–10.5)
nRBC: 0 % (ref 0.0–0.2)

## 2019-07-13 LAB — CULTURE, BLOOD (ROUTINE X 2): Special Requests: ADEQUATE

## 2019-07-13 LAB — BASIC METABOLIC PANEL
Anion gap: 8 (ref 5–15)
BUN: 8 mg/dL (ref 8–23)
CO2: 25 mmol/L (ref 22–32)
Calcium: 7.5 mg/dL — ABNORMAL LOW (ref 8.9–10.3)
Chloride: 96 mmol/L — ABNORMAL LOW (ref 98–111)
Creatinine, Ser: 0.43 mg/dL — ABNORMAL LOW (ref 0.61–1.24)
GFR calc Af Amer: >60 mL/min (ref >60)
GFR calc non Af Amer: >60 mL/min (ref >60)
Glucose, Bld: 206 mg/dL — ABNORMAL HIGH (ref 70–99)
Potassium: 3.3 mmol/L — ABNORMAL LOW (ref 3.5–5.1)
Sodium: 129 mmol/L — ABNORMAL LOW (ref 135–145)

## 2019-07-13 LAB — GLUCOSE, CAPILLARY
Glucose-Capillary: 169 mg/dL — ABNORMAL HIGH (ref 70–99)
Glucose-Capillary: 192 mg/dL — ABNORMAL HIGH (ref 70–99)
Glucose-Capillary: 193 mg/dL — ABNORMAL HIGH (ref 70–99)
Glucose-Capillary: 167 mg/dL — ABNORMAL HIGH (ref 70–99)
Glucose-Capillary: 155 mg/dL — ABNORMAL HIGH (ref 70–99)
Glucose-Capillary: 151 mg/dL — ABNORMAL HIGH (ref 70–99)

## 2019-07-13 MED ORDER — FREE WATER
100.0000 mL | Freq: Three times a day (TID) | Status: DC
  Administered 2019-07-13 – 2019-07-14 (×3): 100 mL

## 2019-07-13 MED ORDER — LIDOCAINE HCL (PF) 1 % IJ SOLN
30.0000 mL | Freq: Once | INTRAMUSCULAR | Status: DC
  Filled 2019-07-13: qty 30

## 2019-07-13 NOTE — Progress Notes (Signed)
Pt experiencing abd pain near PEG site. MD notified, KUB ordered.

## 2019-07-13 NOTE — Consult Note (Signed)
[facility] Consult  Reason for Consult: Bacteremia, Port in place  Referring Physician:  Dr. Manuella Ghazi  Chief Complaint    Fever      HPI: Kurt Duran is a 74 y.o. male with esophageal cancer s/p jejunal feeing tube and [facility] 05/2019. He has been undergoing chemotherapy and radiation treatments. He hopes to eventually be able to get surgery.  He presented to the hospital with weakness and cough. He has been worked up and found to have a fever to 104 and neutropenic, and has blood cultures confirming bacteremia. Today he reports some pain around his jejunal feeding tube 1 hour after medication administration but he has been tolerating his feeds and has no pain with feeds. He otherwise has no complaints or concerns. He is ready to get the port a catheter removed.    Past Medical History:  Diagnosis Date  . Anxiety   . Arthritis   . COPD (chronic obstructive pulmonary disease) ([place])   . Diabetes mellitus without complication ([place])   . Diabetic retinopathy ([place])    NPDR OU  . Esophageal cancer ([place])   . GERD (gastroesophageal reflux disease)   . Gout   . Hypertension   . Hypertensive retinopathy    OU  . Port-A-Cath in place 06/07/2019    Past Surgical History:  Procedure Laterality Date  . BIOPSY  05/10/2019   Procedure: BIOPSY;  Surgeon: Danie Binder, MD;  Location: [facility] ENDO SUITE;  Service: Endoscopy;;  gastric esophageal  . CATARACT EXTRACTION W/PHACO Left 04/03/2016   Procedure: CATARACT EXTRACTION PHACO AND INTRAOCULAR LENS PLACEMENT LEFT EYE CDE= 11.33;  Surgeon: Tonny Branch, MD;  Location: [facility] ORS;  Service: Ophthalmology;  Laterality: Left;  left  . CATARACT EXTRACTION W/PHACO Right 04/28/2016   Procedure: CATARACT EXTRACTION PHACO AND INTRAOCULAR LENS PLACEMENT (IOC);  Surgeon: Tonny Branch, MD;  Location: [facility] ORS;  Service: Ophthalmology;  Laterality: Right;  [id number]   . ESOPHAGOGASTRODUODENOSCOPY (EGD) WITH PROPOFOL N/A 05/10/2019   Fields: medium sized  ulcerating mass with bleeding found in distal esophagus, partially obstructing and circumferential (adenocarcinoma). Gastritis (benign bx) and duodenitis  . EYE SURGERY Bilateral    Cat Sx OU  . JEJUNOSTOMY N/A 05/13/2019   Procedure: JEJUNOSTOMY FEEDING TUBE PLACEMENT (Procedure #2);  Surgeon: Aviva Signs, MD;  Location: [facility] ORS;  Service: General;  Laterality: N/A;  . KNEE ARTHROSCOPY Right   . NECK SURGERY    . PORTACATH PLACEMENT Right 05/13/2019   Procedure: INSERTION PORT-A-CATH (attached catheter in right subclavian) (Procedure #1);  Surgeon: Aviva Signs, MD;  Location: [facility] ORS;  Service: General;  Laterality: Right;  . ROTATOR CUFF REPAIR Right     Family History  Adopted: Yes  Problem Relation Age of Onset  . Hypertension Brother   . Kidney disease Brother     Social History   Tobacco Use  . Smoking status: Former Smoker    Types: Cigarettes    Quit date: 03/26/1989    Years since quitting: 30.3  . Smokeless tobacco: Never Used  Substance Use Topics  . Alcohol use: No  . Drug use: No    Medications:  Current Facility-Administered Medications  Medication Dose Route Frequency Provider Last Rate Last Admin  . 0.9 %  sodium chloride infusion  1,000 mL Intravenous Continuous Kathie Dike, MD 75 mL/hr at 07/13/19 0435 1,000 mL at 07/13/19 0435  . acetaminophen (TYLENOL) tablet 650 mg  650 mg Oral Q6H PRN Kathie Dike, MD   650 mg at 07/10/19 [phone]  Or  . acetaminophen (TYLENOL) suppository 650 mg  650 mg Rectal Q6H PRN Kathie Dike, MD   650 mg at 07/09/19 1839  . albuterol (PROVENTIL) (2.5 MG/3ML) 0.083% nebulizer solution 2.5 mg  2.5 mg Nebulization Q6H PRN Kathie Dike, MD      . ALPRAZolam Duanne Moron) tablet 0.5 mg  0.5 mg Per J Tube BID Kathie Dike, MD   0.5 mg at 07/13/19 0814  . cefTRIAXone (ROCEPHIN) 2 g in sodium chloride 0.9 % 100 mL IVPB  2 g Intravenous Q24H Kathie Dike, MD 200 mL/hr at 07/12/19 1825 2 g at 07/12/19 1825  . Chlorhexidine  Gluconate Cloth 2 % PADS 6 each  6 each Topical Daily Kathie Dike, MD   6 each at 07/12/19 1809  . famotidine (PEPCID) tablet 20 mg  20 mg Per Tube Daily Kathie Dike, MD   20 mg at 07/13/19 0812  . feeding supplement (OSMOLITE 1.5 CAL) liquid 1,000 mL  1,000 mL Per Tube Continuous Kathie Dike, MD   1,000 mL at 07/12/19 1700  . feeding supplement (PRO-STAT SUGAR FREE 64) liquid 30 mL  30 mL Per J Tube BID Kathie Dike, MD   30 mL at 07/12/19 2248  . FLUoxetine (PROZAC) capsule 20 mg  20 mg Per Tube Daily Kathie Dike, MD   20 mg at 07/13/19 0813  . free water 100 mL  100 mL Per Tube Q8H Shah, Pratik D, DO   100 mL at 07/13/19 1510  . HYDROcodone-homatropine (HYCODAN) 5-1.5 MG/5ML syrup 5 mL  5 mL Per Tube Q6H Kathie Dike, MD   5 mL at 07/13/19 1437  . insulin aspart (novoLOG) injection 0-15 Units  0-15 Units Subcutaneous Q4H Kathie Dike, MD   3 Units at 07/13/19 1200  . lidocaine (PF) (XYLOCAINE) 1 % injection 30 mL  30 mL Intradermal Once Virl Cagey, MD   Stopped at 07/13/19 0730  . morphine 2 MG/ML injection 2 mg  2 mg Intravenous Q4H PRN Kathie Dike, MD   2 mg at 07/13/19 1058  . norepinephrine (LEVOPHED) 4mg  in 283mL premix infusion  0-40 mcg/min Intravenous Titrated Kathie Dike, MD   Stopped at 07/09/19 1530  . ondansetron (ZOFRAN) tablet 4 mg  4 mg Oral Q6H PRN Kathie Dike, MD       Or  . ondansetron (ZOFRAN) injection 4 mg  4 mg Intravenous Q6H PRN Kathie Dike, MD   4 mg at 07/10/19 1318  . ondansetron (ZOFRAN) injection 4 mg  4 mg Intravenous Q6H Kathie Dike, MD   4 mg at 07/13/19 1437  . pantoprazole (PROTONIX) injection 40 mg  40 mg Intravenous Q12H Kathie Dike, MD   40 mg at 07/13/19 [id number]     Allergies  Allergen Reactions  . Eggs Or Egg-Derived Products Nausea And Vomiting     ROS:  A comprehensive review of systems was negative except for: Gastrointestinal: positive for sharp pain around j tube site  Blood pressure  132/85, pulse 82, temperature 98.6 F (37 C), temperature source Oral, resp. rate 19, height 6\' 1"  (1.854 m), weight 93.5 kg, SpO2 96 %. Physical Exam Vitals reviewed.  HENT:     Head: Normocephalic.     Nose: Nose normal.  Eyes:     Pupils: Pupils are equal, round, and reactive to light.  Cardiovascular:     Rate and Rhythm: Normal rate.  Pulmonary:     Effort: Pulmonary effort is normal.  Chest:     Comments: Right chest port site  access, incision healed, no erythema  Abdominal:     General: There is no distension.     Palpations: Abdomen is soft.     Tenderness: There is no abdominal tenderness.     Comments: j tube in place, no drainage  Musculoskeletal:        General: Normal range of motion.     Cervical back: No rigidity.  Skin:    General: Skin is warm.  Neurological:     General: No focal deficit present.     Mental Status: He is alert and oriented to person, place, and time.  Psychiatric:        Mood and Affect: Mood normal.        Behavior: Behavior normal.        Thought Content: Thought content normal.        Judgment: Judgment normal.     Results: Results for orders placed or performed during the hospital encounter of 07/09/19 (from the past 48 hour(s))  Glucose, capillary     Status: Abnormal   Collection Time: 07/11/19  7:58 PM  Result Value Ref Range   Glucose-Capillary 140 (H) 70 - 99 mg/dL    Comment: Glucose reference range applies only to samples taken after fasting for at least 8 hours.   Comment 1 Notify RN    Comment 2 Document in Chart   Type and screen [facility]     Status: None   Collection Time: 07/11/19  8:25 PM  Result Value Ref Range   ABO/RH(D) O POS    Antibody Screen NEG    Sample Expiration 07/14/2019,2359    Unit Number [id number]    Blood Component Type RED CELLS,LR    Unit division 00    Status of Unit ISSUED,FINAL    Transfusion Status OK TO TRANSFUSE    Crossmatch Result      Compatible Performed at [facility], [address], [place], [place] [address]   Prepare RBC (crossmatch)     Status: None   Collection Time: 07/11/19  8:25 PM  Result Value Ref Range   Order Confirmation      ORDER PROCESSED BY BLOOD BANK Performed at [facility], [address], [place], [place] [address]   ABO/Rh     Status: None   Collection Time: 07/11/19  8:25 PM  Result Value Ref Range   ABO/RH(D)      O POS Performed at [facility], [address], [place], [place] [address]   Glucose, capillary     Status: Abnormal   Collection Time: 07/11/19 11:50 PM  Result Value Ref Range   Glucose-Capillary 154 (H) 70 - 99 mg/dL    Comment: Glucose reference range applies only to samples taken after fasting for at least 8 hours.   Comment 1 Notify RN    Comment 2 Document in Chart   CBC     Status: Abnormal   Collection Time: 07/12/19  5:56 AM  Result Value Ref Range   WBC 2.3 (L) 4.0 - 10.5 K/uL   RBC 2.84 (L) 4.22 - 5.81 MIL/uL   Hemoglobin 8.6 (L) 13.0 - 17.0 g/dL   HCT 25.4 (L) 39.0 - 52.0 %   MCV 89.4 80.0 - 100.0 fL   MCH 30.3 26.0 - 34.0 pg   MCHC 33.9 30.0 - 36.0 g/dL   RDW 14.0 11.5 - 15.5 %   Platelets 133 (L) 150 - 400 K/uL    Comment: Immature Platelet Fraction may  be clinically indicated, consider ordering this additional test [id number]    nRBC 0.0 0.0 - 0.2 %    Comment: Performed at [facility], [address] [place], [place] [id number]  Basic metabolic panel     Status: Abnormal   Collection Time: 07/12/19  5:56 AM  Result Value Ref Range   Sodium 131 (L) 135 - 145 mmol/L   Potassium 3.8 3.5 - 5.1 mmol/L   Chloride 96 (L) 98 - 111 mmol/L   CO2 26 22 - 32 mmol/L   Glucose, Bld 157 (H) 70 - 99 mg/dL    Comment: Glucose reference range applies only to samples taken after fasting for at least 8 hours.   BUN 10 8 - 23 mg/dL   Creatinine, Ser 0.47 (L) 0.61 - 1.24 mg/dL   Calcium 7.7 (L) 8.9 - 10.3 mg/dL   GFR calc non Af Amer >60 >60 mL/min   GFR calc Af Amer >60 >60 mL/min   Anion  gap 9 5 - 15    Comment: Performed at [facility], [address], [place], [place] [address]  Glucose, capillary     Status: Abnormal   Collection Time: 07/12/19  6:13 AM  Result Value Ref Range   Glucose-Capillary 150 (H) 70 - 99 mg/dL    Comment: Glucose reference range applies only to samples taken after fasting for at least 8 hours.  Glucose, capillary     Status: Abnormal   Collection Time: 07/12/19  7:35 AM  Result Value Ref Range   Glucose-Capillary 143 (H) 70 - 99 mg/dL    Comment: Glucose reference range applies only to samples taken after fasting for at least 8 hours.  Culture, blood (routine x 2)     Status: None (Preliminary result)   Collection Time: 07/12/19  9:47 AM   Specimen: BLOOD  Result Value Ref Range   Specimen Description BLOOD LEFT ANTECUBITAL    Special Requests      Blood Culture results may not be optimal due to an inadequate volume of blood received in culture bottles   Culture      NO GROWTH < 24 HOURS Performed at [facility], [address], [place], [place] [address]    Report Status PENDING   Culture, blood (routine x 2)     Status: None (Preliminary result)   Collection Time: 07/12/19  9:47 AM   Specimen: BLOOD LEFT HAND  Result Value Ref Range   Specimen Description BLOOD LEFT HAND    Special Requests      Blood Culture results may not be optimal due to an inadequate volume of blood received in culture bottles   Culture      NO GROWTH < 24 HOURS Performed at [facility], [address], [place], [place] [address]    Report Status PENDING   Glucose, capillary     Status: Abnormal   Collection Time: 07/12/19 11:24 AM  Result Value Ref Range   Glucose-Capillary 149 (H) 70 - 99 mg/dL    Comment: Glucose reference range applies only to samples taken after fasting for at least 8 hours.  Glucose, capillary     Status: Abnormal   Collection Time: 07/12/19  3:49 PM  Result Value Ref Range   Glucose-Capillary 129 (H) 70 - 99 mg/dL    Comment:  Glucose reference range applies only to samples taken after fasting for at least 8 hours.   Comment 1 Notify RN    Comment 2 Document in Chart  Glucose, capillary     Status: Abnormal   Collection Time: 07/12/19  9:07 PM  Result Value Ref Range   Glucose-Capillary 171 (H) 70 - 99 mg/dL    Comment: Glucose reference range applies only to samples taken after fasting for at least 8 hours.   Comment 1 Notify RN    Comment 2 Document in Chart   Glucose, capillary     Status: Abnormal   Collection Time: 07/13/19  1:02 AM  Result Value Ref Range   Glucose-Capillary 169 (H) 70 - 99 mg/dL    Comment: Glucose reference range applies only to samples taken after fasting for at least 8 hours.   Comment 1 Notify RN    Comment 2 Document in Chart   Glucose, capillary     Status: Abnormal   Collection Time: 07/13/19  4:02 AM  Result Value Ref Range   Glucose-Capillary 192 (H) 70 - 99 mg/dL    Comment: Glucose reference range applies only to samples taken after fasting for at least 8 hours.  CBC     Status: Abnormal   Collection Time: 07/13/19  4:05 AM  Result Value Ref Range   WBC 2.2 (L) 4.0 - 10.5 K/uL   RBC 3.04 (L) 4.22 - 5.81 MIL/uL   Hemoglobin 9.0 (L) 13.0 - 17.0 g/dL   HCT 26.8 (L) 39.0 - 52.0 %   MCV 88.2 80.0 - 100.0 fL   MCH 29.6 26.0 - 34.0 pg   MCHC 33.6 30.0 - 36.0 g/dL   RDW 13.8 11.5 - 15.5 %   Platelets 141 (L) 150 - 400 K/uL   nRBC 0.0 0.0 - 0.2 %    Comment: Performed at [facility], [address], [place], [place] [id number]  Basic metabolic panel     Status: Abnormal   Collection Time: 07/13/19  4:05 AM  Result Value Ref Range   Sodium 129 (L) 135 - 145 mmol/L   Potassium 3.3 (L) 3.5 - 5.1 mmol/L   Chloride 96 (L) 98 - 111 mmol/L   CO2 25 22 - 32 mmol/L   Glucose, Bld 206 (H) 70 - 99 mg/dL    Comment: Glucose reference range applies only to samples taken after fasting for at least 8 hours.   BUN 8 8 - 23 mg/dL   Creatinine, Ser 0.43 (L) 0.61 - 1.24 mg/dL   Calcium  7.5 (L) 8.9 - 10.3 mg/dL   GFR calc non Af Amer >60 >60 mL/min   GFR calc Af Amer >60 >60 mL/min   Anion gap 8 5 - 15    Comment: Performed at [facility], [address], [place], [place] [address]  Glucose, capillary     Status: Abnormal   Collection Time: 07/13/19  7:53 AM  Result Value Ref Range   Glucose-Capillary 193 (H) 70 - 99 mg/dL    Comment: Glucose reference range applies only to samples taken after fasting for at least 8 hours.  Glucose, capillary     Status: Abnormal   Collection Time: 07/13/19 12:09 PM  Result Value Ref Range   Glucose-Capillary 167 (H) 70 - 99 mg/dL    Comment: Glucose reference range applies only to samples taken after fasting for at least 8 hours.  Glucose, capillary     Status: Abnormal   Collection Time: 07/13/19  4:09 PM  Result Value Ref Range   Glucose-Capillary 155 (H) 70 - 99 mg/dL    Comment: Glucose reference range applies only to samples taken after  fasting for at least 8 hours.    Personally reviewed Abd CT- J tube in place and appears appropriate, balloon does not appear to be blocking proximally  DG Abd 1 View  Result Date: 07/13/2019 CLINICAL DATA:  Abdominal pain, pain around PEG tube EXAM: ABDOMEN - 1 VIEW COMPARISON:  CT abdomen pelvis, 07/09/2019 FINDINGS: The bowel gas pattern is normal. No large burden of stool. Left hemiabdomen percutaneous jejunostomy tube. No radio-opaque calculi or other significant radiographic abnormality are seen. IMPRESSION: Nonobstructive pattern of bowel gas. No large burden of stool. No free air on supine radiographs. Left hemiabdomen percutaneous jejunostomy. Electronically Signed   By: Eddie Candle M.D.   On: 07/13/2019 15:20   ECHOCARDIOGRAM LIMITED  Result Date: 07/12/2019    ECHOCARDIOGRAM LIMITED REPORT   Patient Name:   ATTHEW MUNDINGER Date of Exam: 07/12/2019 Medical Rec #:  [id number]       Height:       73.0 in Accession #:    [id number]      Weight:       206.1 lb Date of Birth:  1946-02-21       BSA:           2.179 m Patient Age:    82 years        BP:           129/89 mmHg Patient Gender: M               HR:           84 bpm. Exam Location:  [facility] Procedure: Limited Echo Indications:    Bacteremia 790.7 / R78.81  History:        Patient has prior history of Echocardiogram examinations, most                 recent 05/10/2019. COPD; Risk Factors:Diabetes and Former Smoker.                 GERD, Sepsis, Septic shock , Neutropenic fever.  Sonographer:    Leavy Cella RDCS (AE) Referring Phys: [place]  1. Left ventricular ejection fraction, by estimation, is 55 to 60%. The left ventricle has normal function. The left ventricle has no regional wall motion abnormalities.  2. Right ventricular systolic function is normal. The right ventricular size is normal. There is normal pulmonary artery systolic pressure.  3. Trivial mitral valve regurgitation. No evidence of mitral stenosis.  4. Aortic valve poorly visualized. In the short access heavy shadowing from anular calcification, leaflets poorly visualized. In some views there is at least some suggestion of a density moving independently from the leaflets, possibly complex calcification but cannot exlcude a vegetation. If clinical suspicion for endocarditis consider TEE. There is no significant aortic valve dysfunction. . The aortic valve is abnormal. Aortic valve regurgitation is not visualized. No aortic stenosis is present.  5. Indeterminant PASP, inadequate TR jet.  6. The inferior vena cava is normal in size with greater than 50% respiratory variability, suggesting right atrial pressure of 3 mmHg. FINDINGS  Left Ventricle: Left ventricular ejection fraction, by estimation, is 55 to 60%. The left ventricle has normal function. The left ventricle has no regional wall motion abnormalities. Right Ventricle: The right ventricular size is normal. No increase in right ventricular wall thickness. Right ventricular systolic function is normal.  There is normal pulmonary artery systolic pressure. The tricuspid regurgitant velocity is 1.91 m/s, and  with an assumed right atrial pressure of 10  mmHg, the estimated right ventricular systolic pressure is 123456 mmHg. Mitral Valve: There is mild thickening of the mitral valve leaflet(s). There is mild calcification of the mitral valve leaflet(s). Mild mitral annular calcification. Trivial mitral valve regurgitation. No evidence of mitral valve stenosis. Tricuspid Valve: Tricuspid valve regurgitation is trivial. No evidence of tricuspid stenosis. Aortic Valve: Aortic valve poorly visualized. In the short access heavy shadowing from anular calcification, leaflets poorly visualized. In some views there is at least some suggestion of a density moving independently from the leaflets, possibly complex  calcification but cannot exlcude a vegetation. If clinical suspicion for endocarditis consider TEE. There is no significant aortic valve dysfunction. The aortic valve is abnormal. . There is mild thickening and mild calcification of the aortic valve. Aortic valve regurgitation is not visualized. No aortic stenosis is present. Mild aortic valve annular calcification. There is mild thickening of the aortic valve. There is mild calcification of the aortic valve. Pulmonic Valve: The pulmonic valve was normal in structure. Pulmonic valve regurgitation is not visualized. No evidence of pulmonic stenosis. Pulmonary Artery: Indeterminant PASP, inadequate TR jet. Venous: The inferior vena cava is normal in size with greater than 50% respiratory variability, suggesting right atrial pressure of 3 mmHg.  LEFT VENTRICLE PLAX 2D LVIDd:         4.45 cm LVIDs:         3.17 cm LV PW:         1.14 cm LV IVS:        0.86 cm LVOT diam:     2.20 cm LV SV:         75 LV SV Index:   34 LVOT Area:     3.80 cm  LEFT ATRIUM         Index LA diam:    3.40 cm 1.56 cm/m  AORTIC VALVE LVOT Vmax:   106.00 cm/s LVOT Vmean:  54.500 cm/s LVOT VTI:     0.197 m  AORTA Ao Root diam: 3.20 cm MITRAL VALVE                TRICUSPID VALVE MV Area (PHT): 3.40 cm     TR Peak grad:   14.6 mmHg MV Decel Time: 223 msec     TR Vmax:        191.00 cm/s MV E velocity: 105.00 cm/s MV A velocity: 72.40 cm/s   SHUNTS MV E/A ratio:  1.45         Systemic VTI:  0.20 m                             Systemic Diam: 2.20 cm Carlyle Dolly MD Electronically signed by Carlyle Dolly MD Signature Date/Time: 07/12/2019/11:22:45 AM    Final    CT a/p  CLINICAL DATA:  Fever and history of esophageal cancer.  Sepsis.  EXAM: CT CHEST, ABDOMEN, AND PELVIS WITH CONTRAST  TECHNIQUE: Multidetector CT imaging of the chest, abdomen and pelvis was performed following the standard protocol during bolus administration of intravenous contrast.  CONTRAST:  143mL OMNIPAQUE IOHEXOL 300 MG/ML  SOLN  COMPARISON:  05/09/2019  FINDINGS: CT CHEST FINDINGS  Cardiovascular: Normal heart size. No pericardial effusion. Extensive coronary atherosclerotic calcification. Porta catheter with tip at the distal SVC.  Mediastinum/Nodes: Lower esophageal mass with wall thickening and retain fluid superiorly. There is indistinct fat at the GE junction with periesophageal nodularity likely reflecting tumor. No noted perforation. Small bilateral thyroid  nodules measuring 1 cm or less. No followup recommended (ref: J Am Coll Radiol. 2015 Feb;12(2): 143-50).  Lungs/Pleura: Small left pleural effusion without nodularity. Left lower lobe atelectasis.  Musculoskeletal: Spondylosis. Subcutaneous lipoma partially covered posterior to the right shoulder.  CT ABDOMEN PELVIS FINDINGS  Hepatobiliary: Vague 4 mm low-density in the subcapsular high right lobe liver.Sludge or noncalcified gallstones. The gallbladder is full but there is no acute cholecystitis.  Pancreas: Unremarkable.  Spleen: Unremarkable.  Adrenals/Urinary Tract: Small left adrenal myelolipoma. No hydronephrosis  or stone. Unremarkable bladder.  Stomach/Bowel: Fluid levels reach the distal colon. No bowel wall thickening. Percutaneous jejunostomy tube in good position. Negative for bowel obstruction.  Vascular/Lymphatic: No acute vascular abnormality. Diffuse atherosclerotic calcification. Mildly enlarged lymph nodes about the GE junction.  Reproductive:No acute finding  Other: No ascites or pneumoperitoneum.  Musculoskeletal: Extensive spondylosis. No bone metastases by recent PET-CT.  IMPRESSION: 1. No specific cause of sepsis. 2. There is liquid stool but no colitic wall thickening. 3. Small left pleural effusion with atelectasis. 4. Known obstructive lower esophageal mass. Subcentimeter low-density in the upper right liver, attention on follow-up. 5. Cholelithiasis.   Electronically Signed   By: Monte Fantasia M.D.   On: 07/09/2019 10:27  The risk and benefits of port a catheter removal were discussed including but not limited to bleeding, infection, need to pack a cavity, incomplete removal.  After careful consideration, KAIRON PELOSO has decided to proceed.   Procedure:Port a catheter removal from right subclavian vein  Pre procedure diagnosis: Bacteremia  Post procedure diagnosis: same  Description: The right chest was prepped and draped. 1% Lidocaine was injected into the incision site.  The incision was opened with a 15 blade, and carried down through the subcutaneous tissue. Using blunt and sharp dissection the port a catheter was freed from the cavity. There was no signs of purulence or infection. The catheter was removed in its entirety. Pressure was held under the clavicle for 5 minutes.  The cavity was irrigated and closed with 3-0 vicryl interrupted suture. The skin was closed with Steri strips to allow for some drainage. A gauze and tegaderm were placed. The patient tolerated the procedure without issues. A CXR was ordered to confirm complete removal.    Assessment & Plan:  SHERAZ SACHAR is a 74 y.o. male with bacteremia in the setting of chemotherapy for his esophageal cancer. His port a catheter was removed at the bedside. The J tube was inspected and appears in appropriate position and functioning. The pain is likely related to muscle spasm or peristalsis.    -CXR s/p port removal -PICC planned for tomorrow  -Can follow with Oncology  -Remove dressing 48 hours, can remove steri strips in 7-10 days  -J tube functioning, continue feeds, end of tube is broken off but this cannot be exchanged without IR exchanging it. I recommend using a stopper to stop the tube when not in use.   All questions were answered to the satisfaction of the patient and family.  Virl Cagey 07/13/2019, 6:23 PM

## 2019-07-13 NOTE — Progress Notes (Signed)
PROGRESS NOTE    Kurt Duran  [id number] DOB: 23-Dec-1945 DOA: 07/09/2019 PCP: Glenda Chroman, MD   Brief Narrative:  Kurt Haggard Robertsis a 74 y.o.malewith medical history significant ofesophageal cancer who is receiving chemotherapy as well as radiation treatments. Patient has an obstructive lesion and is PEG tube for nutrition/meds. His last chemotherapy was on 3/31. Reports of the past 10 days, he has had increasing secretions and cough. He was prescribed scopolamine patch which caused dry mouth, but did not improve his respiratory secretions. He has been feeling generally weak and unwell. He came to the ER yesterday wife reports that his temperature was 100.3Fprior to the visit. During his ER stay, his labs were reassuring and he felt better and therefore discharged home. Upon returning home, he had worsening fever this morning developed chills. He was brought back to the emergency room where he was found to have a temperature of 104F. Is also noted to be hypotensive and tachycardic. Lactic acid markedly elevated over 4. He was started on IV fluids per sepsis protocol. CT of the abdomen pelvis as well as chest did not show any source of sepsis. Coronavirus test was also negative. He was noted to be neutropenic. He has been referred for admission  4/7: Patient appears to be doing well today.  Discussed case with cardiology with recommendations to continue 6 weeks of IV antibiotics as TEE is too high risk for him.  He has had some abdominal pain throughout the day and KUB was ordered with no acute findings.  General surgery with plans to remove port this afternoon.  Plans to place PICC line tomorrow.  PT recommending SNF on discharge.  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 Severe sepsis with septic shock-resolved. Patient was noted to be hypotensive, febrile and tachycardic on arrival. He was also neutropenic. 2 sets of blood cultures positive for strep species.  Currently on intravenous ceftriaxone.  He received IV fluid boluses per sepsis protocol and briefly required vasopressors.Lactic acid trended down.  He was able to wean off of vasopressors.  Hemodynamic status has stabilized. 2. Strep constellatus bacteremia.  Currently on IV ceftriaxone.  Echocardiogram ordered cannot rule out vegetation on aortic valve.  Discussed with infectious disease, Dr. Johnnye Sima recommended 6 weeks of IV antibiotics.  It was also recommended that port be removed.  General surgery consulted and plans to remove port this afternoon. 3. Febrile neutropenia.   Related to bacteremia. WBC count is stable.  Appreciate oncology evaluation. 4. Hyponatremia.  Likely related to excessive free water intake which has been decreased.  Continue IV normal saline.  Follow labs in a.m. 5. Pancytopenia. Likely related to recent chemotherapy.   Patient was transfused 1 unit PRBC on 4/5. Follow-up hemoglobin has improved.  Continue to treat supportively and monitor. 6. Diabetes. Patient was on glipizide prior to admission. Will hold for now and continue on sliding scale insulin.  Blood sugars have been stable 7. Esophageal adenocarcinoma. He is followed by oncology for chemotherapy and radiation treatments. Plans are to follow-up with cardiothoracic surgery as an outpatient to consider resection at some  point. Continue PEG tube feedings 8. GERD. Continue on PPI and H2 blockers. 9. GI bleeding.  Patient began to have hematemesis after admission.  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  GI signed off for now with stable  hemoglobin levels.  Continue on PPI twice daily.   DVT prophylaxis: SCDs Code Status: DNR Family Communication: Discussed with wife at the bedside Disposition Plan: Patient is from home.  Plan will be to discharge to skilled nursing facility.  Barriers to discharge at this time are continue treatment with IV antibiotics for sepsis/febrile neutropenia, removal of port   Consultants:   Gastroenterology  Oncology  Cardiology curbside  Procedures:   Echo:1. Left ventricular ejection fraction, by estimation, is 55 to 60%. The  left ventricle has normal function. The left ventricle has no regional  wall motion abnormalities.  2. Right ventricular systolic function is normal. The right ventricular  size is normal. There is normal pulmonary artery systolic pressure.  3. Trivial mitral valve regurgitation. No evidence of mitral stenosis.  4. Aortic valve poorly visualized. In the short access heavy shadowing  from anular calcification, leaflets poorly visualized. In some views there  is at least some suggestion of a density moving independently from the  leaflets, possibly complex  calcification but cannot exlcude a vegetation. If clinical suspicion for  endocarditis consider TEE. There is no significant aortic valve  dysfunction. . The aortic valve is abnormal. Aortic valve regurgitation is  not visualized. No aortic stenosis is  present.  5. Indeterminant PASP, inadequate TR jet.  6. The inferior vena cava is normal in size with greater than 50%  respiratory variability, suggesting right atrial pressure of 3 mmHg.   Antimicrobials:   Cefepime 4/3> 4/5  Ceftriaxone 4/5 >  Subjective: Patient seen and evaluated today with some complaints of abdominal pain around tube feeding site.  Aside from this, no other acute events or concerns noted.  He is tolerating tube feeds well and tube can be flushed appropriately.  Objective: Vitals:   07/12/19 1959 07/12/19 2105  07/13/19 0438 07/13/19 1404  BP:  122/82 130/76 132/85  Pulse:  79 80 82  Resp:  16 16 19   Temp:  98.1 F (36.7 C) 98.1 F (36.7 C) 98.6 F (37 C)  TempSrc:  Oral Oral Oral  SpO2: 96% 97% 94% 96%  Weight:      Height:        Intake/Output Summary (Last 24 hours) at 07/13/2019 1558 Last data filed at 07/13/2019 1100 Gross per 24 hour  Intake 480 ml  Output 950 ml  Net -470 ml   Filed Weights   07/09/19 0829 07/09/19 1414 07/10/19 0500  Weight: 89 kg 93.7 kg 93.5 kg    Examination:  General exam: Appears calm and comfortable  Respiratory system: Clear to auscultation. Respiratory effort normal. Cardiovascular system: S1 & S2 heard, RRR. No JVD, murmurs, rubs, gallops or clicks. No pedal edema. Gastrointestinal system: Abdomen is nondistended, soft and nontender. No organomegaly or masses felt. Normal bowel sounds heard.  PEJ tube present. Central nervous system: Alert and oriented. No focal neurological deficits. Extremities: Symmetric 5 x 5 power. Skin: No rashes, lesions or ulcers Psychiatry: Judgement and insight appear normal. Mood & affect appropriate.     Data Reviewed: I have personally reviewed following labs and imaging studies  CBC: Recent Labs  Lab 07/08/19 0317 07/08/19 [id number] 07/09/19 [id number] 07/09/19 [id number] 07/10/19 0431 07/10/19 0431 07/10/19 1400 07/10/19 2044 07/11/19 0411 07/12/19 0556 07/13/19 0405  WBC 3.7*   < > 0.8*   < > 2.1*  --   --  1.9* 2.0* 2.3* 2.2*  NEUTROABS 3.3  --  0.7*  --  1.7  --   --   --   --   --   --   HGB 8.6*   < > 8.6*   < > 7.4*   < > 7.2* 7.1* 7.1* 8.6* 9.0*  HCT 25.5*   < > 25.4*   < > 22.1*   < > 21.7* 20.9* 21.5* 25.4* 26.8*  MCV 88.9   < > 89.4   < > 90.2  --   --  90.9 90.3 89.4 88.2  PLT 120*   < > 126*   < > 113*  --   --  122* 118* 133* 141*   < > = values in this interval not displayed.   Basic Metabolic Panel: Recent Labs  Lab 07/09/19 0844 07/10/19 0431 07/11/19 0411 07/12/19 0556 07/13/19 0405  NA 126*  130* 131* 131* 129*  K 3.7 3.9 3.5 3.8 3.3*  CL 90* 96* 96* 96* 96*  CO2 23 27 27 26 25   GLUCOSE 267* 263* 183* 157* 206*  BUN 14 14 10 10 8   CREATININE 0.61 0.49* 0.45* 0.47* 0.43*  CALCIUM 7.8* 7.7* 7.7* 7.7* 7.5*  MG 1.6* 1.7  --   --   --    GFR: Estimated Creatinine Clearance: 95.7 mL/min (A) (by C-G formula based on SCr of 0.43 mg/dL (L)). Liver Function Tests: Recent Labs  Lab 07/08/19 0317 07/09/19 0844 07/10/19 0431  AST 20 28 15   ALT 23 24 18   ALKPHOS 85 111 90  BILITOT 0.4 0.6 0.3  PROT 5.8* 5.6* 4.9*  ALBUMIN 2.5* 2.4* 2.1*   No results for input(s): LIPASE, AMYLASE in the last 168 hours. No results for input(s): AMMONIA in the last 168 hours. Coagulation Profile: Recent Labs  Lab 07/09/19 0844  INR 1.2   Cardiac Enzymes: No results for input(s): CKTOTAL, CKMB, CKMBINDEX, TROPONINI in the last 168 hours. BNP (last 3 results) No results for input(s): PROBNP in the last 8760 hours. HbA1C: No results for input(s): HGBA1C in the last 72 hours. CBG: Recent Labs  Lab 07/12/19 2107 07/13/19 0102 07/13/19 0402 07/13/19 0753 07/13/19 1209  GLUCAP 171* 169* 192* 193* 167*   Lipid Profile: No results for input(s): CHOL, HDL, LDLCALC, TRIG, CHOLHDL, LDLDIRECT in the last 72 hours. Thyroid Function Tests: No results for input(s): TSH, T4TOTAL, FREET4, T3FREE, THYROIDAB in the last 72 hours. Anemia Panel: No results for input(s): VITAMINB12, FOLATE, FERRITIN, TIBC, IRON, RETICCTPCT in the last 72 hours. Sepsis Labs: Recent Labs  Lab 07/09/19 0844 07/09/19 1052 07/09/19 1322 07/09/19 1602  LATICACIDVEN 4.6* 5.0* 3.3* 2.7*    Recent Results (from the past 240 hour(s))  Blood Culture (routine x 2)     Status: Abnormal   Collection Time: 07/09/19  8:44 AM   Specimen: BLOOD RIGHT FOREARM  Result Value Ref Range Status   Specimen Description   Final    BLOOD RIGHT FOREARM BOTTLES DRAWN AEROBIC AND ANAEROBIC Performed at [facility], [address], [place], [place] [address]    Special Requests   Final    Blood Culture adequate volume Performed at [facility], [address], [place], [place] [address]    Culture  Setup Time   Final    GRAM POSITIVE COCCI IN CHAINS ANAEROBIC BOTTLE ONLY CRITICAL RESULT CALLED TO, READ BACK BY  AND VERIFIED WITH: RN R Mariea Stable [phone] AT 726 AM BY CM Performed at [facility], [place] [address], [place], [place] [address]    Culture (A)  Final    STREPTOCOCCUS GROUP C CORRECTED ON 04/07 AT 0845: PREVIOUSLY REPORTED AS STREPTOCOCCUS CONSTELLATUS   Report Status 07/13/2019 FINAL  Final   Organism ID, Bacteria STREPTOCOCCUS GROUP C  Final      Susceptibility   Streptococcus group c - MIC*    PENICILLIN INTERMEDIATE Intermediate     CEFTRIAXONE 1 SENSITIVE Sensitive     ERYTHROMYCIN <=0.12 SENSITIVE Sensitive     LEVOFLOXACIN <=0.25 SENSITIVE Sensitive     VANCOMYCIN 0.5 SENSITIVE Sensitive     * STREPTOCOCCUS GROUP C CORRECTED ON 04/07 AT 0845: PREVIOUSLY REPORTED AS STREPTOCOCCUS CONSTELLATUS  Blood Culture (routine x 2)     Status: Abnormal (Preliminary result)   Collection Time: 07/09/19  8:44 AM   Specimen: Right Antecubital; Blood  Result Value Ref Range Status   Specimen Description   Final    RIGHT ANTECUBITAL BOTTLES DRAWN AEROBIC AND ANAEROBIC Performed at [facility], [address], [place], [place] [address]    Special Requests   Final    Blood Culture adequate volume Performed at [facility], [address], [place], [place] [address]    Culture  Setup Time   Final    GRAM POSITIVE COCCI Gram Stain Report Called to,Read Back By and Verified With: BONDURANT,R @ [id number] ON 07/11/19 BY JUW AEROBIC BOTTLE ONLY GS DONE @ [facility] GRAM POSITIVE COCCI GRAM POSITIVE RODS Gram Stain Report Called to,Read Back By and Verified With:  MARY HOWARTON,RN @1711  07/11/2019 KAY ANAEROBIC BOTTLE ONLY Performed at [facility], [address] [place], [place] [address]    Culture (A)  Final     STREPTOCOCCUS GROUP C SUSCEPTIBILITIES PERFORMED ON PREVIOUS CULTURE WITHIN THE LAST 5 DAYS. CULTURE REINCUBATED FOR BETTER GROWTH Performed at [facility], [place] [address], [place], [place] [address]    Report Status PENDING  Incomplete  Blood Culture ID Panel (Reflexed)     Status: Abnormal   Collection Time: 07/09/19  8:44 AM  Result Value Ref Range Status   Enterococcus species NOT DETECTED NOT DETECTED Final   Listeria monocytogenes NOT DETECTED NOT DETECTED Final   Staphylococcus species NOT DETECTED NOT DETECTED Final   Staphylococcus aureus (BCID) NOT DETECTED NOT DETECTED Final   Streptococcus species DETECTED (A) NOT DETECTED Final    Comment: Not Enterococcus species, Streptococcus agalactiae, Streptococcus pyogenes, or Streptococcus pneumoniae. CRITICAL RESULT CALLED TO, READ BACK BY AND VERIFIED WITH: RN R BONDURANT [id number] AT 72 AM BY CM    Streptococcus agalactiae NOT DETECTED NOT DETECTED Final   Streptococcus pneumoniae NOT DETECTED NOT DETECTED Final   Streptococcus pyogenes NOT DETECTED NOT DETECTED Final   Acinetobacter baumannii NOT DETECTED NOT DETECTED Final   Enterobacteriaceae species NOT DETECTED NOT DETECTED Final   Enterobacter cloacae complex NOT DETECTED NOT DETECTED Final   Escherichia coli NOT DETECTED NOT DETECTED Final   Klebsiella oxytoca NOT DETECTED NOT DETECTED Final   Klebsiella pneumoniae NOT DETECTED NOT DETECTED Final   Proteus species NOT DETECTED NOT DETECTED Final   Serratia marcescens NOT DETECTED NOT DETECTED Final   Haemophilus influenzae NOT DETECTED NOT DETECTED Final   Neisseria meningitidis NOT DETECTED NOT DETECTED Final   Pseudomonas aeruginosa NOT DETECTED NOT DETECTED Final   Candida albicans NOT DETECTED NOT DETECTED Final   Candida glabrata NOT DETECTED NOT DETECTED Final  Candida krusei NOT DETECTED NOT DETECTED Final   Candida parapsilosis NOT DETECTED NOT DETECTED Final   Candida tropicalis NOT DETECTED NOT  DETECTED Final    Comment: Performed at [facility], [place] [address] [place], [place] [address]  Urine culture     Status: None   Collection Time: 07/09/19  8:50 AM   Specimen: Urine, Catheterized  Result Value Ref Range Status   Specimen Description   Final    URINE, CATHETERIZED Performed at [facility], [address] [place], [place] [address]    Special Requests   Final    NONE Performed at [facility], [address], [place], [place] [address]    Culture   Final    NO GROWTH Performed at [facility], [place] [address], [place], [place] [address]    Report Status 07/10/2019 FINAL  Final  SARS CORONAVIRUS 2 (TAT 6-24 HRS) Nasopharyngeal Nasopharyngeal Swab     Status: None   Collection Time: 07/09/19  9:08 AM   Specimen: Nasopharyngeal Swab  Result Value Ref Range Status   SARS Coronavirus 2 NEGATIVE NEGATIVE Final    Comment: (NOTE) SARS-CoV-2 target nucleic acids are NOT DETECTED. The SARS-CoV-2 RNA is generally detectable in upper and lower respiratory specimens during the acute phase of infection. Negative results do not preclude SARS-CoV-2 infection, do not rule out co-infections with other pathogens, and should not be used as the sole basis for treatment or other patient management decisions. Negative results must be combined with clinical observations, patient history, and epidemiological information. The expected result is Negative. Fact Sheet for Patients: SugarRoll.be Fact Sheet for Healthcare Providers: https://www.woods-mathews.com/ This test is not yet approved or cleared by the [place] FDA and  has been authorized for detection and/or diagnosis of SARS-CoV-2 by FDA under an Emergency Use Authorization (EUA). This EUA will remain  in effect (meaning this test can be used) for the duration of the COVID-19 declaration under Section 56 4(b)(1) of the Act, 21 U.S.C. section 360bbb-3(b)(1), unless the  authorization is terminated or revoked sooner. Performed at [facility], [place] [address] [place], [place] [address]   Respiratory Panel by RT PCR (Flu A&B, Covid) - Nasopharyngeal Swab     Status: None   Collection Time: 07/09/19 11:45 AM   Specimen: Nasopharyngeal Swab  Result Value Ref Range Status   SARS Coronavirus 2 by RT PCR NEGATIVE NEGATIVE Final    Comment: (NOTE) SARS-CoV-2 target nucleic acids are NOT DETECTED. The SARS-CoV-2 RNA is generally detectable in upper respiratoy specimens during the acute phase of infection. The lowest concentration of SARS-CoV-2 viral copies this assay can detect is 131 copies/mL. A negative result does not preclude SARS-Cov-2 infection and should not be used as the sole basis for treatment or other patient management decisions. A negative result may occur with  improper specimen collection/handling, submission of specimen other than nasopharyngeal swab, presence of viral mutation(s) within the areas targeted by this assay, and inadequate number of viral copies (<131 copies/mL). A negative result must be combined with clinical observations, patient history, and epidemiological information. The expected result is Negative. Fact Sheet for Patients:  PinkCheek.be Fact Sheet for Healthcare Providers:  GravelBags.it This test is not yet ap proved or cleared by the [place] FDA and  has been authorized for detection and/or diagnosis of SARS-CoV-2 by FDA under an Emergency Use Authorization (EUA). This EUA will remain  in effect (meaning this test can be used) for the duration  of the COVID-19 declaration under Section 564(b)(1) of the Act, 21 U.S.C. section 360bbb-3(b)(1), unless the authorization is terminated or revoked sooner.    Influenza A by PCR NEGATIVE NEGATIVE Final   Influenza B by PCR NEGATIVE NEGATIVE Final    Comment: (NOTE) The Xpert Xpress SARS-CoV-2/FLU/RSV  assay is intended as an aid in  the diagnosis of influenza from Nasopharyngeal swab specimens and  should not be used as a sole basis for treatment. Nasal washings and  aspirates are unacceptable for Xpert Xpress SARS-CoV-2/FLU/RSV  testing. Fact Sheet for Patients: PinkCheek.be Fact Sheet for Healthcare Providers: GravelBags.it This test is not yet approved or cleared by the [place] FDA and  has been authorized for detection and/or diagnosis of SARS-CoV-2 by  FDA under an Emergency Use Authorization (EUA). This EUA will remain  in effect (meaning this test can be used) for the duration of the  Covid-19 declaration under Section 564(b)(1) of the Act, 21  U.S.C. section 360bbb-3(b)(1), unless the authorization is  terminated or revoked. Performed at [facility], [address] [place], [place] [address]   MRSA PCR Screening     Status: None   Collection Time: 07/09/19  3:09 PM   Specimen: Nasal Mucosa; Nasopharyngeal  Result Value Ref Range Status   MRSA by PCR NEGATIVE NEGATIVE Final    Comment:        The GeneXpert MRSA Assay (FDA approved for NASAL specimens only), is one component of a comprehensive MRSA colonization surveillance program. It is not intended to diagnose MRSA infection nor to guide or monitor treatment for MRSA infections. Performed at [facility], [address] [place], [place] [address]   Culture, blood (routine x 2)     Status: None (Preliminary result)   Collection Time: 07/12/19  9:47 AM   Specimen: BLOOD  Result Value Ref Range Status   Specimen Description BLOOD LEFT ANTECUBITAL  Final   Special Requests   Final    Blood Culture results may not be optimal due to an inadequate volume of blood received in culture bottles   Culture   Final    NO GROWTH < 24 HOURS Performed at [facility], [address], [place], [place] [address]    Report Status PENDING  Incomplete  Culture, blood  (routine x 2)     Status: None (Preliminary result)   Collection Time: 07/12/19  9:47 AM   Specimen: BLOOD LEFT HAND  Result Value Ref Range Status   Specimen Description BLOOD LEFT HAND  Final   Special Requests   Final    Blood Culture results may not be optimal due to an inadequate volume of blood received in culture bottles   Culture   Final    NO GROWTH < 24 HOURS Performed at [facility], [address], [place], [place] [address]    Report Status PENDING  Incomplete         Radiology Studies: DG Abd 1 View  Result Date: 07/13/2019 CLINICAL DATA:  Abdominal pain, pain around PEG tube EXAM: ABDOMEN - 1 VIEW COMPARISON:  CT abdomen pelvis, 07/09/2019 FINDINGS: The bowel gas pattern is normal. No large burden of stool. Left hemiabdomen percutaneous jejunostomy tube. No radio-opaque calculi or other significant radiographic abnormality are seen. IMPRESSION: Nonobstructive pattern of bowel gas. No large burden of stool. No free air on supine radiographs. Left hemiabdomen percutaneous jejunostomy. Electronically Signed   By: Eddie Candle M.D.   On: 07/13/2019 15:20   ECHOCARDIOGRAM LIMITED  Result Date:  07/12/2019    ECHOCARDIOGRAM LIMITED REPORT   Patient Name:   BRENNDON MATHUS Date of Exam: 07/12/2019 Medical Rec #:  [id number]       Height:       73.0 in Accession #:    [id number]      Weight:       206.1 lb Date of Birth:  12-Apr-1945       BSA:          2.179 m Patient Age:    40 years        BP:           129/89 mmHg Patient Gender: M               HR:           84 bpm. Exam Location:  [facility] Procedure: Limited Echo Indications:    Bacteremia 790.7 / R78.81  History:        Patient has prior history of Echocardiogram examinations, most                 recent 05/10/2019. COPD; Risk Factors:Diabetes and Former Smoker.                 GERD, Sepsis, Septic shock , Neutropenic fever.  Sonographer:    Leavy Cella RDCS (AE) Referring Phys: [place]  1. Left ventricular  ejection fraction, by estimation, is 55 to 60%. The left ventricle has normal function. The left ventricle has no regional wall motion abnormalities.  2. Right ventricular systolic function is normal. The right ventricular size is normal. There is normal pulmonary artery systolic pressure.  3. Trivial mitral valve regurgitation. No evidence of mitral stenosis.  4. Aortic valve poorly visualized. In the short access heavy shadowing from anular calcification, leaflets poorly visualized. In some views there is at least some suggestion of a density moving independently from the leaflets, possibly complex calcification but cannot exlcude a vegetation. If clinical suspicion for endocarditis consider TEE. There is no significant aortic valve dysfunction. . The aortic valve is abnormal. Aortic valve regurgitation is not visualized. No aortic stenosis is present.  5. Indeterminant PASP, inadequate TR jet.  6. The inferior vena cava is normal in size with greater than 50% respiratory variability, suggesting right atrial pressure of 3 mmHg. FINDINGS  Left Ventricle: Left ventricular ejection fraction, by estimation, is 55 to 60%. The left ventricle has normal function. The left ventricle has no regional wall motion abnormalities. Right Ventricle: The right ventricular size is normal. No increase in right ventricular wall thickness. Right ventricular systolic function is normal. There is normal pulmonary artery systolic pressure. The tricuspid regurgitant velocity is 1.91 m/s, and  with an assumed right atrial pressure of 10 mmHg, the estimated right ventricular systolic pressure is 123456 mmHg. Mitral Valve: There is mild thickening of the mitral valve leaflet(s). There is mild calcification of the mitral valve leaflet(s). Mild mitral annular calcification. Trivial mitral valve regurgitation. No evidence of mitral valve stenosis. Tricuspid Valve: Tricuspid valve regurgitation is trivial. No evidence of tricuspid stenosis. Aortic  Valve: Aortic valve poorly visualized. In the short access heavy shadowing from anular calcification, leaflets poorly visualized. In some views there is at least some suggestion of a density moving independently from the leaflets, possibly complex  calcification but cannot exlcude a vegetation. If clinical suspicion for endocarditis consider TEE. There is no significant aortic valve dysfunction. The aortic valve is abnormal. . There is mild thickening and mild  calcification of the aortic valve. Aortic valve regurgitation is not visualized. No aortic stenosis is present. Mild aortic valve annular calcification. There is mild thickening of the aortic valve. There is mild calcification of the aortic valve. Pulmonic Valve: The pulmonic valve was normal in structure. Pulmonic valve regurgitation is not visualized. No evidence of pulmonic stenosis. Pulmonary Artery: Indeterminant PASP, inadequate TR jet. Venous: The inferior vena cava is normal in size with greater than 50% respiratory variability, suggesting right atrial pressure of 3 mmHg.  LEFT VENTRICLE PLAX 2D LVIDd:         4.45 cm LVIDs:         3.17 cm LV PW:         1.14 cm LV IVS:        0.86 cm LVOT diam:     2.20 cm LV SV:         75 LV SV Index:   34 LVOT Area:     3.80 cm  LEFT ATRIUM         Index LA diam:    3.40 cm 1.56 cm/m  AORTIC VALVE LVOT Vmax:   106.00 cm/s LVOT Vmean:  54.500 cm/s LVOT VTI:    0.197 m  AORTA Ao Root diam: 3.20 cm MITRAL VALVE                TRICUSPID VALVE MV Area (PHT): 3.40 cm     TR Peak grad:   14.6 mmHg MV Decel Time: 223 msec     TR Vmax:        191.00 cm/s MV E velocity: 105.00 cm/s MV A velocity: 72.40 cm/s   SHUNTS MV E/A ratio:  1.45         Systemic VTI:  0.20 m                             Systemic Diam: 2.20 cm Carlyle Dolly MD Electronically signed by Carlyle Dolly MD Signature Date/Time: 07/12/2019/11:22:45 AM    Final         Scheduled Meds: . ALPRAZolam  0.5 mg Per J Tube BID  . Chlorhexidine Gluconate  Cloth  6 each Topical Daily  . famotidine  20 mg Per Tube Daily  . feeding supplement (PRO-STAT SUGAR FREE 64)  30 mL Per J Tube BID  . FLUoxetine  20 mg Per Tube Daily  . free water  100 mL Per Tube Q8H  . HYDROcodone-homatropine  5 mL Per Tube Q6H  . insulin aspart  0-15 Units Subcutaneous Q4H  . lidocaine (PF)  30 mL Intradermal Once  . ondansetron (ZOFRAN) IV  4 mg Intravenous Q6H  . pantoprazole (PROTONIX) IV  40 mg Intravenous Q12H   Continuous Infusions: . sodium chloride 1,000 mL (07/13/19 0435)  . cefTRIAXone (ROCEPHIN)  IV 2 g (07/12/19 1825)  . feeding supplement (OSMOLITE 1.5 CAL)    . norepinephrine (LEVOPHED) Adult infusion Stopped (07/09/19 1530)     LOS: 4 days    Time spent: 35 minutes    Mariska Daffin D Manuella Ghazi, DO [facility]  If 7PM-7AM, please contact night-coverage [email or web address] 07/13/2019, 3:58 PM

## 2019-07-13 NOTE — TOC Progression Note (Signed)
Transition of Care [facility]) - Progression Note    Patient Details  Name: Kurt Duran MRN: [id number] Date of Birth: July 04, 1945  Transition of Care [facility]) CM/SW [place], LCSW Phone Number: 07/13/2019, 4:47 PM  Clinical Narrative: CSW met with patient and his wife, Kurt Duran, to discuss SNF bed offers and pending offers. Wife stated they prefer the patient to go to [facility] if there is bed availability rather than going to [facility], but the patient will go to [facility] if [facility] does not have a bed. CSW called Mardene Celeste with [facility] to discuss bed availability. Mardene Celeste asked if chemotherapy and radiation will be continued in rehab. Per physician, chemo and radiation will stop while patient is in rehab as he will be on IV antibiotics. Mardene Celeste requested that TOC follow up tomorrow about bed availability as an offer could not be made today.  Expected Discharge Plan: [place] Barriers to Discharge: Continued Medical Work up  Expected Discharge Plan and Services Expected Discharge Plan: [place] arrangements for the past 2 months: Single Family Home                  Readmission Risk Interventions No flowsheet data found.

## 2019-07-13 NOTE — Progress Notes (Addendum)
REVIEWED. Hb stable. Counts RECOVERING. Will SIGN OFF. PLEASE CALL WITH QUESTIONS.   Subjective: No nausea. Spitting up clear secretions, very scant pink-tinged in canister. No frank hematemesis. Back to baseline from home. Tube feeds started last night, with 5 loose stools. No further after tube feeds stopped this morning. At home will occasionally have looser stools.   Objective: Vital signs in last 24 hours: Temp:  [98 F (36.7 C)-98.1 F (36.7 C)] 98.1 F (36.7 C) (04/07 0438) Pulse Rate:  [78-80] 80 (04/07 0438) Resp:  [16-18] 16 (04/07 0438) BP: (121-130)/(74-82) 130/76 (04/07 0438) SpO2:  [94 %-97 %] 94 % (04/07 0438) Last BM Date: 07/12/19 General:   Alert and oriented, pleasant, chronically ill-appearing Head:  Normocephalic and atraumatic. Abdomen:  Bowel sounds present, soft, non-tender, non-distended. J tube in place  Neurologic:  Alert and  oriented x4   Intake/Output from previous day: 04/06 0701 - 04/07 0700 In: 0  Out: 2000 [Urine:2000] Intake/Output this shift: No intake/output data recorded.  Lab Results: Recent Labs    07/11/19 0411 07/12/19 0556 07/13/19 0405  WBC 2.0* 2.3* 2.2*  HGB 7.1* 8.6* 9.0*  HCT 21.5* 25.4* 26.8*  PLT 118* 133* 141*   BMET Recent Labs    07/11/19 0411 07/12/19 0556 07/13/19 0405  NA 131* 131* 129*  K 3.5 3.8 3.3*  CL 96* 96* 96*  CO2 27 26 25   GLUCOSE 183* 157* 206*  BUN 10 10 8   CREATININE 0.45* 0.47* 0.43*  CALCIUM 7.7* 7.7* 7.5*     Studies/Results: ECHOCARDIOGRAM LIMITED  Result Date: 07/12/2019    ECHOCARDIOGRAM LIMITED REPORT   Patient Name:   DONQUARIUS COLEE Date of Exam: 07/12/2019 Medical Rec #:  [id number]       Height:       73.0 in Accession #:    [id number]      Weight:       206.1 lb Date of Birth:  30-Aug-1945       BSA:          2.179 m Patient Age:    72 years        BP:           129/89 mmHg Patient Gender: M               HR:           84 bpm. Exam Location:  [facility] Procedure: Limited Echo  Indications:    Bacteremia 790.7 / R78.81  History:        Patient has prior history of Echocardiogram examinations, most                 recent 05/10/2019. COPD; Risk Factors:Diabetes and Former Smoker.                 GERD, Sepsis, Septic shock , Neutropenic fever.  Sonographer:    Leavy Cella RDCS (AE) Referring Phys: [place]  1. Left ventricular ejection fraction, by estimation, is 55 to 60%. The left ventricle has normal function. The left ventricle has no regional wall motion abnormalities.  2. Right ventricular systolic function is normal. The right ventricular size is normal. There is normal pulmonary artery systolic pressure.  3. Trivial mitral valve regurgitation. No evidence of mitral stenosis.  4. Aortic valve poorly visualized. In the short access heavy shadowing from anular calcification, leaflets poorly visualized. In some views there is at least some suggestion of a density moving independently from the leaflets, possibly  complex calcification but cannot exlcude a vegetation. If clinical suspicion for endocarditis consider TEE. There is no significant aortic valve dysfunction. . The aortic valve is abnormal. Aortic valve regurgitation is not visualized. No aortic stenosis is present.  5. Indeterminant PASP, inadequate TR jet.  6. The inferior vena cava is normal in size with greater than 50% respiratory variability, suggesting right atrial pressure of 3 mmHg. FINDINGS  Left Ventricle: Left ventricular ejection fraction, by estimation, is 55 to 60%. The left ventricle has normal function. The left ventricle has no regional wall motion abnormalities. Right Ventricle: The right ventricular size is normal. No increase in right ventricular wall thickness. Right ventricular systolic function is normal. There is normal pulmonary artery systolic pressure. The tricuspid regurgitant velocity is 1.91 m/s, and  with an assumed right atrial pressure of 10 mmHg, the estimated right  ventricular systolic pressure is 123456 mmHg. Mitral Valve: There is mild thickening of the mitral valve leaflet(s). There is mild calcification of the mitral valve leaflet(s). Mild mitral annular calcification. Trivial mitral valve regurgitation. No evidence of mitral valve stenosis. Tricuspid Valve: Tricuspid valve regurgitation is trivial. No evidence of tricuspid stenosis. Aortic Valve: Aortic valve poorly visualized. In the short access heavy shadowing from anular calcification, leaflets poorly visualized. In some views there is at least some suggestion of a density moving independently from the leaflets, possibly complex  calcification but cannot exlcude a vegetation. If clinical suspicion for endocarditis consider TEE. There is no significant aortic valve dysfunction. The aortic valve is abnormal. . There is mild thickening and mild calcification of the aortic valve. Aortic valve regurgitation is not visualized. No aortic stenosis is present. Mild aortic valve annular calcification. There is mild thickening of the aortic valve. There is mild calcification of the aortic valve. Pulmonic Valve: The pulmonic valve was normal in structure. Pulmonic valve regurgitation is not visualized. No evidence of pulmonic stenosis. Pulmonary Artery: Indeterminant PASP, inadequate TR jet. Venous: The inferior vena cava is normal in size with greater than 50% respiratory variability, suggesting right atrial pressure of 3 mmHg.  LEFT VENTRICLE PLAX 2D LVIDd:         4.45 cm LVIDs:         3.17 cm LV PW:         1.14 cm LV IVS:        0.86 cm LVOT diam:     2.20 cm LV SV:         75 LV SV Index:   34 LVOT Area:     3.80 cm  LEFT ATRIUM         Index LA diam:    3.40 cm 1.56 cm/m  AORTIC VALVE LVOT Vmax:   106.00 cm/s LVOT Vmean:  54.500 cm/s LVOT VTI:    0.197 m  AORTA Ao Root diam: 3.20 cm MITRAL VALVE                TRICUSPID VALVE MV Area (PHT): 3.40 cm     TR Peak grad:   14.6 mmHg MV Decel Time: 223 msec     TR Vmax:         191.00 cm/s MV E velocity: 105.00 cm/s MV A velocity: 72.40 cm/s   SHUNTS MV E/A ratio:  1.45         Systemic VTI:  0.20 m  Systemic Diam: 2.20 cm Carlyle Dolly MD Electronically signed by Carlyle Dolly MD Signature Date/Time: 07/12/2019/11:22:45 AM    Final     Assessment: 74 year 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 Not a candidate for esophageal stent. Secretions have returned to his baseline as outpatient, now clear and able to tolerate with oral suctioning as needed. No further frank hematemesis.   Sepsis with bacteremia: echo with concern for vegetations. Management per hospitalist.   Pancytopenia: in setting of chemotherapy. Received 1 unit PRBCs 4/5 with improvement in Hgb to 9. Leukopenia and thrombocytopenia improving.   Loose stools: overnight with resuming tube feeds. Notes occasional loose stools at home. Likely related to tube feeds and doubt infectious process.   Plan: Follow H/H PPI BID Monitor for frank hematemesis: could consider EGD with hemospray if recurrent Follow for any further loose stools but likely secondary to tube feeds   Annitta Needs, PhD, ANP-BC [facility]     LOS: 4 days    07/13/2019, 7:39 AM

## 2019-07-13 NOTE — Progress Notes (Addendum)
Nutrition Follow up   INTERVENTION:  Tube feeding started- -Osmolite 1.5 ml/hr- start at 30 ml/hr and advance q 4hr to goal of 60 ml/hr x 15 hr daily.   -Prostat 30 BID via tube.   -Free water 100 ml q 8 hrs  NUTRITION DIAGNOSIS:   Increased nutrient needs related to cancer and cancer related treatments as evidenced by estimated needs.   GOAL:  Patient will meet greater than or equal to 90% of their needs   MONITOR:  Labs, Weight trends, I & O's(medical clearance to resume tube feeds)   REASON FOR ASSESSMENT:  Malnutrition Screening Tool- Home tube feeding.     ASSESSMENT: Patient has esophageal cancer and is undergoing chemo and radiation therapy. Lives with his spouse. He presents with fever,hypotension and tachycardic. Elevated lactic acid, neutropenic. Severe sepsis with septic shock (blood cultures pending). Bloody secretions.   Talked with patient spouse Jana Half who reports good tolerance of tube feeds up to current acute illness. He has 13 more radiation treatments.   Followed by outpatient RD- last contact 3/26. He is NPO- PEG dependent for 100% of nutrition provision. Tube feeding being held currently. Patient receiving IV fluids-NS @75  ml/hr.   Home regimen: Osmolite 1.5 (6 cartons daily) -89 ml/hr from 3 pm - 7 am. Free water -1000 ml daily. Protein modular 30 ml-TID (ProSource). Provides: 2313 kcal, 134.4 gr protein and 1086 water from formula and free water flushes for total ~2100 ml water daily   4/7- Patient tube feed resumed. Loose stools per nursing. Patient tube feeding started yesterday. Currently Osmolite 1.5 at 30 ml/hr providing 675 kcal, 28 gr protein and 343 ml water. Talked with nursing who is in process of advancing rate.     Weight: currently 205.7 lb (93.5 kg)  3/26 -wt 194 lb (88.1 kg)  Medications reviewed.   Labs reviewed: sodium  131 (L), glucose 183 , WBC-2.0 (L), Hgb 7.1 (L). CBG's-145, 176, 149 mg/dl.   Diet Order:   Diet Order        Diet NPO time specified  Diet effective midnight              EDUCATION NEEDS:  Education needs have been addressed   Skin:  Skin Assessment: Reviewed RN Assessment  Last BM:  Loose stools  Height:   Ht Readings from Last 1 Encounters:  07/09/19 6\' 1"  (1.854 m)    Weight:   Wt Readings from Last 1 Encounters:  07/10/19 93.5 kg    Ideal Body Weight:   75 kg  BMI:  Body mass index is 27.2 kg/m.  Estimated Nutritional Needs:   Kcal:  2150-2580  Protein:  110-130 gr  Fluid:  > 2liters daily   Colman Cater MS,RD,CSG,LDN Contact available through [employer]

## 2019-07-13 NOTE — Progress Notes (Signed)
Patient experiencing abd pain around PEG site again. MD notified.

## 2019-07-13 NOTE — Progress Notes (Addendum)
Patient discussed with primary team, both Dr Roderic Palau and Dr Manuella Ghazi. Admitted with sepsis and strep bacteremia. His TTE has limited visualization of the aortic valve partially due to significant calcification of the anulus and leaflets. It is difficult to fully exclude a potential vegatation. There is no significant valvular regurgitation. Patient has a history of esophageal cancer, 05/2019 EGD showed a distal esophageal mass near obstructing and circumferential with ulcerations and signs of recent bleeding. He has a PEG for feeding and all medications. I spoke with family and patient does not take anything at all by mouth, trouble managing even his secretions. A TEE in the setting of an obstructive bleeding esophageal mass with hematemsis this admission is not logistically possible. If there is a vegetation it would likely be small given there is no valvular dysfunction, may consider treating standard course and following clinically but would defer to ID.     Carlyle Dolly MD

## 2019-07-14 ENCOUNTER — Inpatient Hospital Stay: Payer: Self-pay

## 2019-07-14 DIAGNOSIS — R7881 Bacteremia: Secondary | ICD-10-CM

## 2019-07-14 LAB — CBC
HCT: 29.6 % — ABNORMAL LOW (ref 39.0–52.0)
Hemoglobin: 10 g/dL — ABNORMAL LOW (ref 13.0–17.0)
MCH: 29.8 pg (ref 26.0–34.0)
MCHC: 33.8 g/dL (ref 30.0–36.0)
MCV: 88.1 fL (ref 80.0–100.0)
Platelets: 140 10*3/uL — ABNORMAL LOW (ref 150–400)
RBC: 3.36 MIL/uL — ABNORMAL LOW (ref 4.22–5.81)
RDW: 13.9 % (ref 11.5–15.5)
WBC: 2.6 10*3/uL — ABNORMAL LOW (ref 4.0–10.5)
nRBC: 0 % (ref 0.0–0.2)

## 2019-07-14 LAB — GLUCOSE, CAPILLARY
Glucose-Capillary: 184 mg/dL — ABNORMAL HIGH (ref 70–99)
Glucose-Capillary: 187 mg/dL — ABNORMAL HIGH (ref 70–99)
Glucose-Capillary: 200 mg/dL — ABNORMAL HIGH (ref 70–99)
Glucose-Capillary: 202 mg/dL — ABNORMAL HIGH (ref 70–99)
Glucose-Capillary: 221 mg/dL — ABNORMAL HIGH (ref 70–99)
Glucose-Capillary: 231 mg/dL — ABNORMAL HIGH (ref 70–99)

## 2019-07-14 LAB — BASIC METABOLIC PANEL WITH GFR
Anion gap: 10 (ref 5–15)
BUN: 8 mg/dL (ref 8–23)
CO2: 25 mmol/L (ref 22–32)
Calcium: 7.6 mg/dL — ABNORMAL LOW (ref 8.9–10.3)
Chloride: 94 mmol/L — ABNORMAL LOW (ref 98–111)
Creatinine, Ser: 0.5 mg/dL — ABNORMAL LOW (ref 0.61–1.24)
GFR calc Af Amer: >60 mL/min
GFR calc non Af Amer: >60 mL/min
Glucose, Bld: 194 mg/dL — ABNORMAL HIGH (ref 70–99)
Potassium: 3.6 mmol/L (ref 3.5–5.1)
Sodium: 129 mmol/L — ABNORMAL LOW (ref 135–145)

## 2019-07-14 LAB — MAGNESIUM: Magnesium: 1.8 mg/dL (ref 1.7–2.4)

## 2019-07-14 MED ORDER — SODIUM CHLORIDE 1 G PO TABS: 1.0000 g | ORAL_TABLET | Freq: Three times a day (TID) | ORAL | Status: DC

## 2019-07-14 MED ORDER — SODIUM CHLORIDE 1 G PO TABS
1.0000 g | ORAL_TABLET | Freq: Two times a day (BID) | ORAL | Status: DC
  Administered 2019-07-14 – 2019-07-15 (×3): 1 g via JEJUNOSTOMY
  Filled 2019-07-14 (×3): qty 1

## 2019-07-14 NOTE — Progress Notes (Signed)
PHARMACY CONSULT NOTE FOR:  OUTPATIENT  PARENTERAL ANTIBIOTIC THERAPY (OPAT)  Indication:  Strep bacteremia Regimen:  ceftriaxone 2g IV q24h End date:  08/22/19  IV antibiotic discharge orders are pended. To discharging provider:  please sign these orders via discharge navigator,  Select New Orders & click on the button choice - Manage This Unsigned Work.     Thank you for allowing pharmacy to be a part of this patient's care.  Despina Pole 07/14/2019, 8:42 AM

## 2019-07-14 NOTE — Progress Notes (Signed)
PROGRESS NOTE    Kurt Duran  [id number] DOB: Nov 24, 1945 DOA: 07/09/2019 PCP: Glenda Chroman, MD   Brief Narrative:  Norville Haggard Robertsis a 74 y.o.malewith medical history significant ofesophageal cancer who is receiving chemotherapy as well as radiation treatments. Patient has an obstructive lesion and is PEG tube for nutrition/meds. His last chemotherapy was on 3/31. Reports of the past 10 days, he has had increasing secretions and cough. He was prescribed scopolamine patch which caused dry mouth, but did not improve his respiratory secretions. He has been feeling generally weak and unwell. He came to the ER yesterday wife reports that his temperature was 100.3Fprior to the visit. During his ER stay, his labs were reassuring and he felt better and therefore discharged home. Upon returning home, he had worsening fever this morning developed chills. He was brought back to the emergency room where he was found to have a temperature of 104F. Is also noted to be hypotensive and tachycardic. Lactic acid markedly elevated over 4. He was started on IV fluids per sepsis protocol. CT of the abdomen pelvis as well as chest did not show any source of sepsis. Coronavirus test was also negative. He was noted to be neutropenic. He has been referred for admission  4/7: Patient appears to be doing well today.  Discussed case with cardiology with recommendations to continue 6 weeks of IV antibiotics as TEE is too high risk for him.  He has had some abdominal pain throughout the day and KUB was ordered with no acute findings.  General surgery with plans to remove port this afternoon.  Plans to place PICC line tomorrow.  PT recommending SNF on discharge.  4/8: Per ID, patient to have PICC line placement tomorrow if blood cultures remain negative.  He has had port removed on 4/7.  Continue current antibiotics and monitor repeat lab work in a.m.  Start sodium tablets per J-tube and discontinue IV  fluid as well as free water flushes and monitor hyponatremia.  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 Severe sepsis with septic shock-resolved. Patient was noted to be hypotensive, febrile and tachycardic on arrival. He was also neutropenic. 2 sets of blood cultures positive for strep species. Currently on intravenous ceftriaxone. He received IV fluid boluses per sepsis protocoland briefly required vasopressors.Lactic acid trended down. He was able to wean off of vasopressors. Hemodynamic status has stabilized. 2. Strep constellatus bacteremia. Currently on IV ceftriaxone. Echocardiogram ordered cannot rule out vegetation on aortic valve. Discussed with infectious disease, Dr. Johnnye Sima recommended 6 weeks of IV antibiotics. It was also recommended that port be removed. General surgery has removed port on 4/7.  ID recommends placement of PICC line by 4/9 if blood cultures remain negative. 3. Febrile neutropenia. Related to bacteremia. WBC count is stable.  Appreciate oncology evaluation. 4. Hyponatremia-persistent and chronic.  Likely related to excessive free water intake which has now been discontinued as well as IV normal saline.  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  We will also give sodium tablets today and repeat BMP in a.m. 5. Pancytopenia-stable. Likely related to recent chemotherapy.Patient was  transfused 1 unit PRBC on 4/5. Follow-up hemoglobin has improved. Continue to treat supportively and monitor. 6. Diabetes. Patient was on glipizide prior to admission. Will hold for now and continue on sliding  scale insulin.Blood sugars have been stable 7. Esophageal adenocarcinoma. He is followed by oncology for chemotherapy and radiation treatments. Plans are to follow-up with cardiothoracic surgery as an outpatient to consider resection at some point. Continue PEJ tube feedings 8. GERD. Continue on PPI and H2 blockers. 9. GI bleeding. Patient began to have hematemesis after admission. 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 GI signed off for now with stable hemoglobin levels.  Continue on PPI twice daily.   DVT prophylaxis:SCDs Code Status:DNR Family Communication:Discussed with wife at the bedside Disposition Plan:Patient is from home. Plan will be to discharge to skilled nursing facility hopefully by 4/9.  Barriers at this time are placement of PICC line that will need to take place on 4/9.  OPAT completed.   Consultants:  Gastroenterology  Oncology  Cardiology curbside  Procedures:  Echo:1. Left ventricular ejection fraction, by estimation, is 55 to 60%. The  left ventricle has normal function. The left ventricle has no regional  wall motion abnormalities.  2. Right ventricular systolic function is normal. The right ventricular  size is normal. There is normal pulmonary artery systolic pressure.  3. Trivial mitral valve regurgitation. No evidence of mitral stenosis.  4. Aortic valve poorly visualized. In the short access heavy shadowing  from anular calcification, leaflets poorly visualized. In some views there  is at least some suggestion of a density moving independently from the  leaflets, possibly complex  calcification but cannot exlcude a vegetation. If clinical suspicion for  endocarditis consider TEE. There is no significant aortic valve  dysfunction. . The aortic valve is abnormal. Aortic valve regurgitation is  not visualized. No aortic stenosis is  present.  5. Indeterminant PASP, inadequate TR jet.  6. The inferior vena cava is  normal in size with greater than 50%  respiratory variability, suggesting right atrial pressure of 3 mmHg.   Antimicrobials:   Cefepime 4/3> 4/5  Ceftriaxone 4/5 >  Subjective: Patient seen and evaluated today with no new acute complaints or concerns. No acute concerns or events noted overnight.  He denies any further abdominal pain this morning.  Objective: Vitals:   07/13/19 0438 07/13/19 1404 07/13/19 2112 07/14/19 0531  BP: 130/76 132/85 123/87 129/79  Pulse: 80 82 82 84  Resp: 16 19 20 19   Temp: 98.1 F (36.7 C) 98.6 F (37 C) 97.8 F (36.6 C) 98.5 F (36.9 C)  TempSrc: Oral Oral Oral Oral  SpO2: 94% 96% 97% 95%  Weight:      Height:        Intake/Output Summary (Last 24 hours) at 07/14/2019 1046 Last data filed at 07/14/2019 0527 Gross per 24 hour  Intake 3723.47 ml  Output 2800 ml  Net 923.47 ml   Filed Weights   07/09/19 0829 07/09/19 1414 07/10/19 0500  Weight: 89 kg 93.7 kg 93.5 kg    Examination:  General exam: Appears calm and comfortable  Respiratory system: Clear to auscultation. Respiratory effort normal.  Currently on room air Cardiovascular system: S1 & S2 heard, RRR. No JVD, murmurs, rubs, gallops or clicks. No pedal edema. Gastrointestinal system: Abdomen is nondistended, soft and nontender. No organomegaly or masses felt. Normal bowel sounds heard.  PEJ tube present without any drainage or erythema surrounding. Central nervous system: Alert and oriented. No focal neurological deficits. Extremities:  Symmetric 5 x 5 power. Skin: No rashes, lesions or ulcers Psychiatry: Judgement and insight appear normal. Mood & affect appropriate.     Data Reviewed: I have personally reviewed following labs and imaging studies  CBC: Recent Labs  Lab 07/08/19 0317 07/08/19 0317 07/09/19 0844 07/09/19 0844 07/10/19 0431 07/10/19 1400 07/10/19 2044 07/11/19 0411 07/12/19 0556 07/13/19 0405 07/14/19 0408  WBC 3.7*   < > 0.8*   < > 2.1*  --  1.9* 2.0*  2.3* 2.2* 2.6*  NEUTROABS 3.3  --  0.7*  --  1.7  --   --   --   --   --   --   HGB 8.6*   < > 8.6*   < > 7.4*   < > 7.1* 7.1* 8.6* 9.0* 10.0*  HCT 25.5*   < > 25.4*   < > 22.1*   < > 20.9* 21.5* 25.4* 26.8* 29.6*  MCV 88.9   < > 89.4   < > 90.2  --  90.9 90.3 89.4 88.2 88.1  PLT 120*   < > 126*   < > 113*  --  122* 118* 133* 141* 140*   < > = values in this interval not displayed.   Basic Metabolic Panel: Recent Labs  Lab 07/09/19 0844 07/09/19 0844 07/10/19 0431 07/11/19 0411 07/12/19 0556 07/13/19 0405 07/14/19 0408  NA 126*   < > 130* 131* 131* 129* 129*  K 3.7   < > 3.9 3.5 3.8 3.3* 3.6  CL 90*   < > 96* 96* 96* 96* 94*  CO2 23   < > 27 27 26 25 25   GLUCOSE 267*   < > 263* 183* 157* 206* 194*  BUN 14   < > 14 10 10 8 8   CREATININE 0.61   < > 0.49* 0.45* 0.47* 0.43* 0.50*  CALCIUM 7.8*   < > 7.7* 7.7* 7.7* 7.5* 7.6*  MG 1.6*  --  1.7  --   --   --  1.8   < > = values in this interval not displayed.   GFR: Estimated Creatinine Clearance: 95.7 mL/min (A) (by C-G formula based on SCr of 0.5 mg/dL (L)). Liver Function Tests: Recent Labs  Lab 07/08/19 0317 07/09/19 0844 07/10/19 0431  AST 20 28 15   ALT 23 24 18   ALKPHOS 85 111 90  BILITOT 0.4 0.6 0.3  PROT 5.8* 5.6* 4.9*  ALBUMIN 2.5* 2.4* 2.1*   No results for input(s): LIPASE, AMYLASE in the last 168 hours. No results for input(s): AMMONIA in the last 168 hours. Coagulation Profile: Recent Labs  Lab 07/09/19 0844  INR 1.2   Cardiac Enzymes: No results for input(s): CKTOTAL, CKMB, CKMBINDEX, TROPONINI in the last 168 hours. BNP (last 3 results) No results for input(s): PROBNP in the last 8760 hours. HbA1C: No results for input(s): HGBA1C in the last 72 hours. CBG: Recent Labs  Lab 07/13/19 1609 07/13/19 2114 07/14/19 0011 07/14/19 0522 07/14/19 0813  GLUCAP 155* 151* 202* 187* 184*   Lipid Profile: No results for input(s): CHOL, HDL, LDLCALC, TRIG, CHOLHDL, LDLDIRECT in the last 72 hours. Thyroid  Function Tests: No results for input(s): TSH, T4TOTAL, FREET4, T3FREE, THYROIDAB in the last 72 hours. Anemia Panel: No results for input(s): VITAMINB12, FOLATE, FERRITIN, TIBC, IRON, RETICCTPCT in the last 72 hours. Sepsis Labs: Recent Labs  Lab 07/09/19 0844 07/09/19 1052 07/09/19 1322 07/09/19 1602  LATICACIDVEN 4.6* 5.0* 3.3* 2.7*    Recent Results (from the  past 240 hour(s))  Blood Culture (routine x 2)     Status: Abnormal   Collection Time: 07/09/19  8:44 AM   Specimen: BLOOD RIGHT FOREARM  Result Value Ref Range Status   Specimen Description   Final    BLOOD RIGHT FOREARM BOTTLES DRAWN AEROBIC AND ANAEROBIC Performed at [facility], [address], [place], [place] [address]    Special Requests   Final    Blood Culture adequate volume Performed at [facility], [address], [place], [place] [address]    Culture  Setup Time   Final    GRAM POSITIVE COCCI IN CHAINS ANAEROBIC BOTTLE ONLY CRITICAL RESULT CALLED TO, READ BACK BY AND VERIFIED WITH: RN R [place] [id number] AT 726 AM BY CM Performed at [facility], No Name [address] [place], [place] [address]    Culture (A)  Final    STREPTOCOCCUS GROUP C CORRECTED ON 04/07 AT 0845: PREVIOUSLY REPORTED AS STREPTOCOCCUS CONSTELLATUS   Report Status 07/13/2019 FINAL  Final   Organism ID, Bacteria STREPTOCOCCUS GROUP C  Final      Susceptibility   Streptococcus group c - MIC*    PENICILLIN INTERMEDIATE Intermediate     CEFTRIAXONE 1 SENSITIVE Sensitive     ERYTHROMYCIN <=0.12 SENSITIVE Sensitive     LEVOFLOXACIN <=0.25 SENSITIVE Sensitive     VANCOMYCIN 0.5 SENSITIVE Sensitive     * STREPTOCOCCUS GROUP C CORRECTED ON 04/07 AT 0845: PREVIOUSLY REPORTED AS STREPTOCOCCUS CONSTELLATUS  Blood Culture (routine x 2)     Status: Abnormal (Preliminary result)   Collection Time: 07/09/19  8:44 AM   Specimen: Right Antecubital; Blood  Result Value Ref Range Status   Specimen Description   Final    RIGHT ANTECUBITAL BOTTLES  DRAWN AEROBIC AND ANAEROBIC Performed at [facility], [address], [place], [place] [address]    Special Requests   Final    Blood Culture adequate volume Performed at [facility], [address] [place], [place] [address]    Culture  Setup Time   Final    GRAM POSITIVE COCCI Gram Stain Report Called to,Read Back By and Verified With: BONDURANT,R @ [id number] ON 07/11/19 BY JUW AEROBIC BOTTLE ONLY GS DONE @ [facility] GRAM POSITIVE COCCI GRAM POSITIVE RODS Gram Stain Report Called to,Read Back By and Verified With:  MARY HOWARTON,RN @1711  07/11/2019 KAY ANAEROBIC BOTTLE ONLY Performed at [facility], [address], [place], [place] [address]    Culture (A)  Final    STREPTOCOCCUS GROUP C SUSCEPTIBILITIES PERFORMED ON PREVIOUS CULTURE WITHIN THE LAST 5 DAYS. CULTURE REINCUBATED FOR BETTER GROWTH Performed at [facility], [place] [address] [place], [place] [address]    Report Status PENDING  Incomplete  Blood Culture ID Panel (Reflexed)     Status: Abnormal   Collection Time: 07/09/19  8:44 AM  Result Value Ref Range Status   Enterococcus species NOT DETECTED NOT DETECTED Final   Listeria monocytogenes NOT DETECTED NOT DETECTED Final   Staphylococcus species NOT DETECTED NOT DETECTED Final   Staphylococcus aureus (BCID) NOT DETECTED NOT DETECTED Final   Streptococcus species DETECTED (A) NOT DETECTED Final    Comment: Not Enterococcus species, Streptococcus agalactiae, Streptococcus pyogenes, or Streptococcus pneumoniae. CRITICAL RESULT CALLED TO, READ BACK BY AND VERIFIED WITH: RN R [place] [id number] AT 68 AM BY CM    Streptococcus agalactiae NOT DETECTED NOT DETECTED Final   Streptococcus pneumoniae NOT DETECTED NOT DETECTED Final   Streptococcus pyogenes NOT DETECTED NOT DETECTED Final  Acinetobacter baumannii NOT DETECTED NOT DETECTED Final   Enterobacteriaceae species NOT DETECTED NOT DETECTED Final   Enterobacter cloacae complex NOT DETECTED NOT DETECTED Final   Escherichia  coli NOT DETECTED NOT DETECTED Final   Klebsiella oxytoca NOT DETECTED NOT DETECTED Final   Klebsiella pneumoniae NOT DETECTED NOT DETECTED Final   Proteus species NOT DETECTED NOT DETECTED Final   Serratia marcescens NOT DETECTED NOT DETECTED Final   Haemophilus influenzae NOT DETECTED NOT DETECTED Final   Neisseria meningitidis NOT DETECTED NOT DETECTED Final   Pseudomonas aeruginosa NOT DETECTED NOT DETECTED Final   Candida albicans NOT DETECTED NOT DETECTED Final   Candida glabrata NOT DETECTED NOT DETECTED Final   Candida krusei NOT DETECTED NOT DETECTED Final   Candida parapsilosis NOT DETECTED NOT DETECTED Final   Candida tropicalis NOT DETECTED NOT DETECTED Final    Comment: Performed at [facility], [place] [address] [place], [place] [address]  Urine culture     Status: None   Collection Time: 07/09/19  8:50 AM   Specimen: Urine, Catheterized  Result Value Ref Range Status   Specimen Description   Final    URINE, CATHETERIZED Performed at [facility], [address] [place], [place] [address]    Special Requests   Final    NONE Performed at [facility], [address] [place], [place] [address]    Culture   Final    NO GROWTH Performed at [facility], [place] [address] [place], [place] [address]    Report Status 07/10/2019 FINAL  Final  SARS CORONAVIRUS 2 (TAT 6-24 HRS) Nasopharyngeal Nasopharyngeal Swab     Status: None   Collection Time: 07/09/19  9:08 AM   Specimen: Nasopharyngeal Swab  Result Value Ref Range Status   SARS Coronavirus 2 NEGATIVE NEGATIVE Final    Comment: (NOTE) SARS-CoV-2 target nucleic acids are NOT DETECTED. The SARS-CoV-2 RNA is generally detectable in upper and lower respiratory specimens during the acute phase of infection. Negative results do not preclude SARS-CoV-2 infection, do not rule out co-infections with other pathogens, and should not be used as the sole basis for treatment or other patient management  decisions. Negative results must be combined with clinical observations, patient history, and epidemiological information. The expected result is Negative. Fact Sheet for Patients: SugarRoll.be Fact Sheet for Healthcare Providers: https://www.woods-mathews.com/ This test is not yet approved or cleared by the [place] FDA and  has been authorized for detection and/or diagnosis of SARS-CoV-2 by FDA under an Emergency Use Authorization (EUA). This EUA will remain  in effect (meaning this test can be used) for the duration of the COVID-19 declaration under Section 56 4(b)(1) of the Act, 21 U.S.C. section 360bbb-3(b)(1), unless the authorization is terminated or revoked sooner. Performed at [facility], [place] [address] [place], [place] [address]   Respiratory Panel by RT PCR (Flu A&B, Covid) - Nasopharyngeal Swab     Status: None   Collection Time: 07/09/19 11:45 AM   Specimen: Nasopharyngeal Swab  Result Value Ref Range Status   SARS Coronavirus 2 by RT PCR NEGATIVE NEGATIVE Final    Comment: (NOTE) SARS-CoV-2 target nucleic acids are NOT DETECTED. The SARS-CoV-2 RNA is generally detectable in upper respiratoy specimens during the acute phase of infection. The lowest concentration of SARS-CoV-2 viral copies this assay can detect is 131 copies/mL. A negative result does not preclude SARS-Cov-2 infection and should not be used as the sole basis for treatment or other patient management  decisions. A negative result may occur with  improper specimen collection/handling, submission of specimen other than nasopharyngeal swab, presence of viral mutation(s) within the areas targeted by this assay, and inadequate number of viral copies (<131 copies/mL). A negative result must be combined with clinical observations, patient history, and epidemiological information. The expected result is Negative. Fact Sheet for Patients:   PinkCheek.be Fact Sheet for Healthcare Providers:  GravelBags.it This test is not yet ap proved or cleared by the [place] FDA and  has been authorized for detection and/or diagnosis of SARS-CoV-2 by FDA under an Emergency Use Authorization (EUA). This EUA will remain  in effect (meaning this test can be used) for the duration of the COVID-19 declaration under Section 564(b)(1) of the Act, 21 U.S.C. section 360bbb-3(b)(1), unless the authorization is terminated or revoked sooner.    Influenza A by PCR NEGATIVE NEGATIVE Final   Influenza B by PCR NEGATIVE NEGATIVE Final    Comment: (NOTE) The Xpert Xpress SARS-CoV-2/FLU/RSV assay is intended as an aid in  the diagnosis of influenza from Nasopharyngeal swab specimens and  should not be used as a sole basis for treatment. Nasal washings and  aspirates are unacceptable for Xpert Xpress SARS-CoV-2/FLU/RSV  testing. Fact Sheet for Patients: PinkCheek.be Fact Sheet for Healthcare Providers: GravelBags.it This test is not yet approved or cleared by the [place] FDA and  has been authorized for detection and/or diagnosis of SARS-CoV-2 by  FDA under an Emergency Use Authorization (EUA). This EUA will remain  in effect (meaning this test can be used) for the duration of the  Covid-19 declaration under Section 564(b)(1) of the Act, 21  U.S.C. section 360bbb-3(b)(1), unless the authorization is  terminated or revoked. Performed at [facility], [address] [place], [place] [address]   MRSA PCR Screening     Status: None   Collection Time: 07/09/19  3:09 PM   Specimen: Nasal Mucosa; Nasopharyngeal  Result Value Ref Range Status   MRSA by PCR NEGATIVE NEGATIVE Final    Comment:        The GeneXpert MRSA Assay (FDA approved for NASAL specimens only), is one component of a comprehensive MRSA  colonization surveillance program. It is not intended to diagnose MRSA infection nor to guide or monitor treatment for MRSA infections. Performed at [facility], [address] [place], [place] [address]   Culture, blood (routine x 2)     Status: None (Preliminary result)   Collection Time: 07/12/19  9:47 AM   Specimen: BLOOD  Result Value Ref Range Status   Specimen Description BLOOD LEFT ANTECUBITAL  Final   Special Requests   Final    Blood Culture results may not be optimal due to an inadequate volume of blood received in culture bottles   Culture   Final    NO GROWTH 2 DAYS Performed at [facility], [address], [place], [place] [address]    Report Status PENDING  Incomplete  Culture, blood (routine x 2)     Status: None (Preliminary result)   Collection Time: 07/12/19  9:47 AM   Specimen: BLOOD LEFT HAND  Result Value Ref Range Status   Specimen Description BLOOD LEFT HAND  Final   Special Requests   Final    Blood Culture results may not be optimal due to an inadequate volume of blood received in culture bottles   Culture   Final    NO GROWTH 2 DAYS Performed at [facility], [address] [place], [place]  [address]    Report Status PENDING  Incomplete         Radiology Studies: DG Abd 1 View  Result Date: 07/13/2019 CLINICAL DATA:  Abdominal pain, pain around PEG tube EXAM: ABDOMEN - 1 VIEW COMPARISON:  CT abdomen pelvis, 07/09/2019 FINDINGS: The bowel gas pattern is normal. No large burden of stool. Left hemiabdomen percutaneous jejunostomy tube. No radio-opaque calculi or other significant radiographic abnormality are seen. IMPRESSION: Nonobstructive pattern of bowel gas. No large burden of stool. No free air on supine radiographs. Left hemiabdomen percutaneous jejunostomy. Electronically Signed   By: Eddie Candle M.D.   On: 07/13/2019 15:20   DG Chest Port 1 View  Result Date: 07/13/2019 CLINICAL DATA:  Recent Port-A-Cath removal. EXAM: PORTABLE CHEST 1 VIEW  COMPARISON:  July 09, 2019 FINDINGS: The right-sided venous Port-A-Cath seen on the prior study has been removed. Mild atelectasis and/or infiltrate is seen within the retrocardiac region of the left lung base. There is no evidence of a pleural effusion or pneumothorax. The heart size and mediastinal contours are within normal limits. A radiopaque fusion plate and screws are seen overlying the cervical spine. Radiopaque surgical pins are seen overlying the right humeral head. Multilevel degenerative changes seen throughout the thoracic spine. IMPRESSION: 1. Mild left basilar atelectasis and/or infiltrate. 2. Postoperative changes within the cervical spine. Electronically Signed   By: Virgina Norfolk M.D.   On: 07/13/2019 19:28   [place] EKG SITE RITE  Result Date: 07/14/2019 If Site Rite image not attached, placement could not be confirmed due to current cardiac rhythm.       Scheduled Meds:  ALPRAZolam  0.5 mg Per J Tube BID   Chlorhexidine Gluconate Cloth  6 each Topical Daily   famotidine  20 mg Per Tube Daily   feeding supplement (PRO-STAT SUGAR FREE 64)  30 mL Per J Tube BID   FLUoxetine  20 mg Per Tube Daily   HYDROcodone-homatropine  5 mL Per Tube Q6H   insulin aspart  0-15 Units Subcutaneous Q4H   lidocaine (PF)  30 mL Intradermal Once   ondansetron (ZOFRAN) IV  4 mg Intravenous Q6H   pantoprazole (PROTONIX) IV  40 mg Intravenous Q12H   sodium chloride  1 g Per J Tube BID   Continuous Infusions:  cefTRIAXone (ROCEPHIN)  IV 2 g (07/13/19 1934)   feeding supplement (OSMOLITE 1.5 CAL)     norepinephrine (LEVOPHED) Adult infusion Stopped (07/09/19 1530)     LOS: 5 days    Time spent: 30 minutes    Zephyr Ridley Darleen Crocker, DO [facility]  If 7PM-7AM, please contact night-coverage [email or web address] 07/14/2019, 10:46 AM

## 2019-07-14 NOTE — Progress Notes (Signed)
Per Dr. Algis Downs recommendations via "securechat" PICC will be scheduled for placement tomorrow 07/15/19

## 2019-07-14 NOTE — Progress Notes (Signed)
Spoke with Caryl Pina RN regarding PICC placement. Waiting on blood culture results. Will check again later today.

## 2019-07-14 NOTE — Progress Notes (Signed)
[facility]  CXR with catheter fully removed, no ptx. Would remove dressing 07/15/2019, and leave steri strips in place for 7-10 days.   Call if issues or concern.  Curlene Labrum, MD [facility] [address] [place], [place] [address] [phone] (office)

## 2019-07-14 NOTE — Consult Note (Signed)
Kurt Duran      MRN: [id number]    Location: A303/A303-01  Date: 07/14/2019 Time:6:47 PM   Subjective: Interval History:Kurt Duran is seen this evening.  He is lying in bed.  His wife is at bedside.  He had Port-A-Cath removed today.  Overall he reports that he is feeling better today.  No fevers in the last couple of days.  Objective: Vital signs in last 24 hours: Temp:  [97.8 F (36.6 C)-98.6 F (37 C)] 98.6 F (37 C) (04/08 1421) Pulse Rate:  [80-84] 80 (04/08 1421) Resp:  [19-20] 19 (04/08 1421) BP: (123-129)/(79-87) 124/79 (04/08 1421) SpO2:  [95 %-98 %] 97 % (04/08 1640)    Intake/Output from previous day: 04/07 0800 - 04/08 0759 In: 3963.5 [P.O.:720; I.V.:2943.5] Out: 2800 [Urine:2450]    Intake/Output this shift: Total I/O In: -  Out: 1300 [Urine:1300]   PHYSICAL EXAM: BP 124/79 (BP Location: Right Arm)   Pulse 80   Temp 98.6 F (37 C) (Oral)   Resp 19   Ht 6\' 1"  (1.854 m)   Wt 206 lb 2.1 oz (93.5 kg)   SpO2 97%   BMI 27.20 kg/m  General appearance: alert, cooperative and appears stated age Lungs: Bilateral air entry. Heart: regular rate and rhythm Abdomen: Soft, G-tube site is within normal limits. Extremities: No edema or cyanosis. Skin: Skin color, texture, turgor normal. No rashes or lesions Neurologic: Grossly normal   Studies/Results: Results for orders placed or performed during the hospital encounter of 07/09/19 (from the past 48 hour(s))  Glucose, capillary     Status: Abnormal   Collection Time: 07/12/19  9:07 PM  Result Value Ref Range   Glucose-Capillary 171 (H) 70 - 99 mg/dL    Comment: Glucose reference range applies only to samples taken after fasting for at least 8 hours.   Comment 1 Notify RN    Comment 2 Document in Chart   Glucose, capillary     Status: Abnormal   Collection Time: 07/13/19  1:02 AM  Result Value Ref Range   Glucose-Capillary 169 (H) 70 - 99 mg/dL     Comment: Glucose reference range applies only to samples taken after fasting for at least 8 hours.   Comment 1 Notify RN    Comment 2 Document in Chart   Glucose, capillary     Status: Abnormal   Collection Time: 07/13/19  4:02 AM  Result Value Ref Range   Glucose-Capillary 192 (H) 70 - 99 mg/dL    Comment: Glucose reference range applies only to samples taken after fasting for at least 8 hours.  CBC     Status: Abnormal   Collection Time: 07/13/19  4:05 AM  Result Value Ref Range   WBC 2.2 (L) 4.0 - 10.5 K/uL   RBC 3.04 (L) 4.22 - 5.81 MIL/uL   Hemoglobin 9.0 (L) 13.0 - 17.0 g/dL   HCT 26.8 (L) 39.0 - 52.0 %   MCV 88.2 80.0 - 100.0 fL   MCH 29.6 26.0 - 34.0 pg   MCHC 33.6 30.0 - 36.0 g/dL   RDW 13.8 11.5 - 15.5 %   Platelets 141 (L) 150 - 400 K/uL   nRBC 0.0 0.0 - 0.2 %    Comment: Performed at [facility] [address], [place], [place] [id number]  Basic metabolic panel     Status: Abnormal   Collection Time: 07/13/19  4:05 AM  Result Value Ref Range  Sodium 129 (L) 135 - 145 mmol/L   Potassium 3.3 (L) 3.5 - 5.1 mmol/L   Chloride 96 (L) 98 - 111 mmol/L   CO2 25 22 - 32 mmol/L   Glucose, Bld 206 (H) 70 - 99 mg/dL    Comment: Glucose reference range applies only to samples taken after fasting for at least 8 hours.   BUN 8 8 - 23 mg/dL   Creatinine, Ser 0.43 (L) 0.61 - 1.24 mg/dL   Calcium 7.5 (L) 8.9 - 10.3 mg/dL   GFR calc non Af Amer >60 >60 mL/min   GFR calc Af Amer >60 >60 mL/min   Anion gap 8 5 - 15    Comment: Performed at [facility], [address], [place], [place] [address]  Glucose, capillary     Status: Abnormal   Collection Time: 07/13/19  7:53 AM  Result Value Ref Range   Glucose-Capillary 193 (H) 70 - 99 mg/dL    Comment: Glucose reference range applies only to samples taken after fasting for at least 8 hours.  Glucose, capillary     Status: Abnormal   Collection Time: 07/13/19 12:09 PM  Result Value Ref Range   Glucose-Capillary 167 (H) 70 - 99 mg/dL     Comment: Glucose reference range applies only to samples taken after fasting for at least 8 hours.  Glucose, capillary     Status: Abnormal   Collection Time: 07/13/19  4:09 PM  Result Value Ref Range   Glucose-Capillary 155 (H) 70 - 99 mg/dL    Comment: Glucose reference range applies only to samples taken after fasting for at least 8 hours.  Glucose, capillary     Status: Abnormal   Collection Time: 07/13/19  9:14 PM  Result Value Ref Range   Glucose-Capillary 151 (H) 70 - 99 mg/dL    Comment: Glucose reference range applies only to samples taken after fasting for at least 8 hours.   Comment 1 Notify RN    Comment 2 Document in Chart   Glucose, capillary     Status: Abnormal   Collection Time: 07/14/19 12:11 AM  Result Value Ref Range   Glucose-Capillary 202 (H) 70 - 99 mg/dL    Comment: Glucose reference range applies only to samples taken after fasting for at least 8 hours.   Comment 1 Notify RN    Comment 2 Document in Chart   Basic metabolic panel     Status: Abnormal   Collection Time: 07/14/19  4:08 AM  Result Value Ref Range   Sodium 129 (L) 135 - 145 mmol/L   Potassium 3.6 3.5 - 5.1 mmol/L   Chloride 94 (L) 98 - 111 mmol/L   CO2 25 22 - 32 mmol/L   Glucose, Bld 194 (H) 70 - 99 mg/dL    Comment: Glucose reference range applies only to samples taken after fasting for at least 8 hours.   BUN 8 8 - 23 mg/dL   Creatinine, Ser 0.50 (L) 0.61 - 1.24 mg/dL   Calcium 7.6 (L) 8.9 - 10.3 mg/dL   GFR calc non Af Amer >60 >60 mL/min   GFR calc Af Amer >60 >60 mL/min   Anion gap 10 5 - 15    Comment: Performed at [facility], [address], [place], [place] [address]  Magnesium     Status: None   Collection Time: 07/14/19  4:08 AM  Result Value Ref Range   Magnesium 1.8 1.7 - 2.4 mg/dL    Comment: Performed at  [facility] [place], [place] [address]  CBC     Status: Abnormal   Collection Time: 07/14/19  4:08 AM  Result Value Ref Range   WBC 2.6 (L) 4.0 - 10.5  K/uL   RBC 3.36 (L) 4.22 - 5.81 MIL/uL   Hemoglobin 10.0 (L) 13.0 - 17.0 g/dL   HCT 29.6 (L) 39.0 - 52.0 %   MCV 88.1 80.0 - 100.0 fL   MCH 29.8 26.0 - 34.0 pg   MCHC 33.8 30.0 - 36.0 g/dL   RDW 13.9 11.5 - 15.5 %   Platelets 140 (L) 150 - 400 K/uL   nRBC 0.0 0.0 - 0.2 %    Comment: Performed at [facility], [address] [place], [place] [address]  Glucose, capillary     Status: Abnormal   Collection Time: 07/14/19  5:22 AM  Result Value Ref Range   Glucose-Capillary 187 (H) 70 - 99 mg/dL    Comment: Glucose reference range applies only to samples taken after fasting for at least 8 hours.  Glucose, capillary     Status: Abnormal   Collection Time: 07/14/19  8:13 AM  Result Value Ref Range   Glucose-Capillary 184 (H) 70 - 99 mg/dL    Comment: Glucose reference range applies only to samples taken after fasting for at least 8 hours.  Glucose, capillary     Status: Abnormal   Collection Time: 07/14/19 12:05 PM  Result Value Ref Range   Glucose-Capillary 221 (H) 70 - 99 mg/dL    Comment: Glucose reference range applies only to samples taken after fasting for at least 8 hours.  Glucose, capillary     Status: Abnormal   Collection Time: 07/14/19  4:14 PM  Result Value Ref Range   Glucose-Capillary 200 (H) 70 - 99 mg/dL    Comment: Glucose reference range applies only to samples taken after fasting for at least 8 hours.   DG Abd 1 View  Result Date: 07/13/2019 CLINICAL DATA:  Abdominal pain, pain around PEG tube EXAM: ABDOMEN - 1 VIEW COMPARISON:  CT abdomen pelvis, 07/09/2019 FINDINGS: The bowel gas pattern is normal. No large burden of stool. Left hemiabdomen percutaneous jejunostomy tube. No radio-opaque calculi or other significant radiographic abnormality are seen. IMPRESSION: Nonobstructive pattern of bowel gas. No large burden of stool. No free air on supine radiographs. Left hemiabdomen percutaneous jejunostomy. Electronically Signed   By: Eddie Candle M.D.   On: 07/13/2019 15:20    DG Chest Port 1 View  Result Date: 07/13/2019 CLINICAL DATA:  Recent Port-A-Cath removal. EXAM: PORTABLE CHEST 1 VIEW COMPARISON:  July 09, 2019 FINDINGS: The right-sided venous Port-A-Cath seen on the prior study has been removed. Mild atelectasis and/or infiltrate is seen within the retrocardiac region of the left lung base. There is no evidence of a pleural effusion or pneumothorax. The heart size and mediastinal contours are within normal limits. A radiopaque fusion plate and screws are seen overlying the cervical spine. Radiopaque surgical pins are seen overlying the right humeral head. Multilevel degenerative changes seen throughout the thoracic spine. IMPRESSION: 1. Mild left basilar atelectasis and/or infiltrate. 2. Postoperative changes within the cervical spine. Electronically Signed   By: Virgina Norfolk M.D.   On: 07/13/2019 19:28   [place] EKG SITE RITE  Result Date: 07/14/2019 If Site Rite image not attached, placement could not be confirmed due to current cardiac rhythm.    MEDICATIONS: I have reviewed the patient's current medications.     Assessment/Plan:  1.  Esophageal adenocarcinoma: -4 doses of weekly carboplatin and paclitaxel with radiation from 06/15/2019 through 07/06/2019. -He has 2 weeks of treatment left.  Currently is on hold because of infection.  2.  Sepsis with Streptococcus group C: -Blood cultures on 07/09/2019 shows Streptococcus group C, sensitive to ceftriaxone. -Blood cultures on 07/12/2019 did not show any growth in 2 days. -Port-A-Cath was removed on 07/14/2019.  PICC line planned on 07/15/2019. -2D echocardiogram showed EF 55-60%.  Aortic valve was poorly visualized.  There was a questionable vegetation although small. -Cardiology felt that TEE was too risky at this time. -ID has recommended 6 weeks of antibiotics.  3.  Pancytopenia: -CBC today shows white count 2.6.  Hemoglobin is 10 and slowly improving. -Received 1 unit PRBC on 07/11/2019.  4.   Nutrition: -Osmolite 1.5, 6 cans continues between 3 PM and 7 AM.  5.  Follow-up: -He will be discharged to skilled nursing facility in [place]. -Patient follow-up with me in the office once he is discharged from the skilled nursing facility.  All questions were answered. The patient knows to call the clinic with any problems, questions or concerns. We can certainly see the patient much sooner if necessary.     Derek Jack

## 2019-07-14 NOTE — TOC Progression Note (Signed)
Transition of Care [facility]) - Progression Note    Patient Details  Name: Kurt Duran MRN: [id number] Date of Birth: 04-26-1945  [facility] CM/SW Contact  Shade Flood, LCSW Phone Number: 07/14/2019, 11:06 AM  Clinical Narrative:     TOC following. Spoke with Mardene Celeste at [facility] and they will offer pt a bed. Dr. Manuella Ghazi anticipating dc tomorrow if pt stable. Waiting on PASRR number still.   Updated pt's wife who is pleased with the bed offer and will accept. Will update her again tomorrow.  Will follow.  Expected Discharge Plan: [place] Barriers to Discharge: Continued Medical Work up  Expected Discharge Plan and Services Expected Discharge Plan: [place] arrangements for the past 2 months: Single Family Home                                       Social Determinants of Health (SDOH) Interventions    Readmission Risk Interventions No flowsheet data found.

## 2019-07-14 NOTE — TOC Progression Note (Signed)
Transition of Care (TOC) -30 day Note       Patient Details  Name: Kurt Duran MRN: [id number] Date of Birth: October 04, 1945   Transition of Care [facility]) CM/SW Contact  Name: Shade Flood  Phone Number: [phone] Date: 07/14/2019 Time: 11:00   MUST ID: [id number]   To Whom it May Concern:   Please be advised that the above patient will require a short-term nursing home stay, anticipated 30 days or less rehabilitation and strengthening. The plan is for return home.

## 2019-07-15 ENCOUNTER — Telehealth: Payer: Medicare Other | Admitting: Thoracic Surgery (Cardiothoracic Vascular Surgery)

## 2019-07-15 LAB — BASIC METABOLIC PANEL
Anion gap: 7 (ref 5–15)
BUN: 9 mg/dL (ref 8–23)
CO2: 28 mmol/L (ref 22–32)
Calcium: 7.4 mg/dL — ABNORMAL LOW (ref 8.9–10.3)
Chloride: 93 mmol/L — ABNORMAL LOW (ref 98–111)
Creatinine, Ser: 0.52 mg/dL — ABNORMAL LOW (ref 0.61–1.24)
GFR calc Af Amer: >60 mL/min (ref >60)
GFR calc non Af Amer: >60 mL/min (ref >60)
Glucose, Bld: 229 mg/dL — ABNORMAL HIGH (ref 70–99)
Potassium: 3.6 mmol/L (ref 3.5–5.1)
Sodium: 128 mmol/L — ABNORMAL LOW (ref 135–145)

## 2019-07-15 LAB — GLUCOSE, CAPILLARY
Glucose-Capillary: 189 mg/dL — ABNORMAL HIGH (ref 70–99)
Glucose-Capillary: 192 mg/dL — ABNORMAL HIGH (ref 70–99)
Glucose-Capillary: 201 mg/dL — ABNORMAL HIGH (ref 70–99)
Glucose-Capillary: 208 mg/dL — ABNORMAL HIGH (ref 70–99)
Glucose-Capillary: 215 mg/dL — ABNORMAL HIGH (ref 70–99)
Glucose-Capillary: 221 mg/dL — ABNORMAL HIGH (ref 70–99)
Glucose-Capillary: 248 mg/dL — ABNORMAL HIGH (ref 70–99)

## 2019-07-15 LAB — MAGNESIUM: Magnesium: 1.7 mg/dL (ref 1.7–2.4)

## 2019-07-15 LAB — TSH: TSH: 1.27 u[IU]/mL (ref 0.350–4.500)

## 2019-07-15 LAB — OSMOLALITY: Osmolality: 273 mOsm/kg — ABNORMAL LOW (ref 275–295)

## 2019-07-15 MED ORDER — SODIUM CHLORIDE 0.9% FLUSH
10.0000 mL | Freq: Two times a day (BID) | INTRAVENOUS | Status: DC
  Administered 2019-07-15 – 2019-07-16 (×3): 10 mL

## 2019-07-15 MED ORDER — SODIUM CHLORIDE 0.9% FLUSH: 10.0000 mL | INTRAVENOUS | Status: DC | PRN

## 2019-07-15 MED ORDER — SODIUM CHLORIDE 1 G PO TABS
2.0000 g | ORAL_TABLET | Freq: Three times a day (TID) | ORAL | Status: DC
  Administered 2019-07-15 – 2019-07-16 (×4): 2 g via JEJUNOSTOMY
  Filled 2019-07-15 (×5): qty 2

## 2019-07-15 NOTE — TOC Progression Note (Addendum)
Transition of Care [facility]) - Progression Note    Patient Details  Name: Kurt Duran MRN: [id number] Date of Birth: [birth year]  Transition of [facility]) CM/SW Contact  Shade Flood, LCSW Phone Number: 07/15/2019, 4:06 PM  Clinical Narrative:     TOC following. Pt not yet medically stable for dc per MD. Updated Mardene Celeste at [facility]. If pt stable for dc on Saturday, they can take him IF they get the dc summary by 10:30AM. They cannot take pt on Sunday.  Updated MD. Julienne Kass TOC will be available to assist as needed.   Expected Discharge Plan: [place] Barriers to Discharge: Continued Medical Work up  Expected Discharge Plan and Services Expected Discharge Plan: [place] arrangements for the past 2 months: Single Family Home                                       Social Determinants of Health (SDOH) Interventions    Readmission Risk Interventions No flowsheet data found.

## 2019-07-15 NOTE — Progress Notes (Signed)

## 2019-07-15 NOTE — Care Management Important Message (Signed)
Important Message  Patient Details  Name: Kurt Duran MRN: [id number] Date of Birth: 10-Mar-1946   Medicare Important Message Given:  Yes     Tommy Medal 07/15/2019, 1:00 PM

## 2019-07-15 NOTE — Progress Notes (Signed)
PROGRESS NOTE    Kurt Duran  [id number] DOB: [birth year] DOA: 07/09/2019 PCP: Glenda Chroman, MD   Brief Narrative:  Kurt Haggard Robertsis a 74 y.o.malewith medical history significant ofesophageal cancer who is receiving chemotherapy as well as radiation treatments. Patient has an obstructive lesion and is PEG tube for nutrition/meds. His last chemotherapy was on 3/31. Reports of the past 10 days, he has had increasing secretions and cough. He was prescribed scopolamine patch which caused dry mouth, but did not improve his respiratory secretions. He has been feeling generally weak and unwell. He came to the ER yesterday wife reports that his temperature was 100.3Fprior to the visit. During his ER stay, his labs were reassuring and he felt better and therefore discharged home. Upon returning home, he had worsening fever this morning developed chills. He was brought back to the emergency room where he was found to have a temperature of 104F. Is also noted to be hypotensive and tachycardic. Lactic acid markedly elevated over 4. He was started on IV fluids per sepsis protocol. CT of the abdomen pelvis as well as chest did not show any source of sepsis. Coronavirus test was also negative. He was noted to be neutropenic. He has been referred for admission  4/7:Patient appears to be doing well today. Discussed case with cardiology with recommendations to continue 6 weeks of IV antibiotics as TEE is too high risk for him. He has had some abdominal pain throughout the day and KUB was ordered with no acute findings. General surgery with plans to remove port this afternoon. Plans to place PICC line tomorrow. PT recommending SNF on discharge.  4/8: Per ID, patient to have PICC line placement tomorrow if blood cultures remain negative.  He has had port removed on 4/7.  Continue current antibiotics and monitor repeat lab work in a.m.  Start sodium tablets per J-tube and discontinue IV  fluid as well as free water flushes and monitor hyponatremia.  4/9: Plans for PICC line placement noted for today.  Unfortunately, patient still continues to have persistent hyponatremia which is trending in the wrong direction.  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  Will order further studies with TSH as well as urine and serum osmolarity's.  Avoid further IV fluid as well as free water flushes.  Continue on sodium chloride tablet pushes which will be increased to 2 g 3 times daily today.  Follow BMP in a.m.  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 Severe sepsis with septic shock-resolved. Patient was noted to be hypotensive, febrile and tachycardic on arrival. He was also neutropenic. 2 sets of blood cultures positive for strep species. Currently on intravenous ceftriaxone. He received IV fluid boluses per sepsis protocoland briefly required vasopressors.Lactic acid trended down. He was able to wean off of vasopressors. Hemodynamic status has stabilized. 2. Strepconstellatusbacteremia. Currently on IV ceftriaxone. Echocardiogram ordered cannot rule out vegetation on aortic valve. Discussed with infectious disease, Dr. Johnnye Sima recommended 6 weeks of IV antibiotics. It was also recommended that port be removed. General surgery has removed port on 4/7.  ID recommends placement of PICC line which will be done today. 3. Febrile neutropenia. Related to bacteremia. WBC count isstable. Appreciate oncology evaluation. 4. 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  He is not having much improvement in his sodium levels despite fluid restrictions and stopping IV fluid.  He appears euvolemic and generally has sodium range between 133-135.  He is currently at 128 and I will increase sodium intake with tablets increased to 2 g 3 times daily and will order TSH as well as serum and urine osmolarity for further evaluation. 5. Pancytopenia-stable. Likely related to recent chemotherapy.Patient was transfused 1 unit PRBC on 4/5. Follow-up hemoglobin has improved. Continue to treat supportively and monitor. 6. Diabetes. Patient was on glipizide prior to admission. Will hold for now and continue on sliding scale insulin.Blood sugars have been slightly elevated. 7. Esophageal adenocarcinoma. He is followed by oncology for chemotherapy and radiation treatments. Plans are to follow-up with cardiothoracic surgery as an outpatient to consider resection at some point. Continue PEJ tube feedings 8. GERD. Continue on PPI and H2 blockers. 9. GI bleeding. Patient began to have hematemesis after admission. 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 GIsigned off for now with stable hemoglobin levels. Continue on PPI twice daily.   DVT prophylaxis:SCDs Code Status:DNR Family Communication:Discussed daily with wife at the bedside Disposition Plan:Patient is from home. Plan will be to discharge to skilled nursing facility hopefully by 4/10-4/11.  Barriers at this time are placement of PICC line, which should be completed today, along with evaluation of hyponatremia.   Consultants:  Gastroenterology-[place]  Oncology  Cardiology curbside  Procedures:  Echo:1. Left ventricular ejection fraction, by estimation, is 55 to 60%. The  left ventricle has normal function. The left ventricle has no regional  wall motion abnormalities.  2. Right ventricular systolic function is normal. The right ventricular  size  is normal. There is normal pulmonary artery systolic pressure.  3. Trivial mitral valve regurgitation. No evidence of mitral stenosis.  4. Aortic valve poorly visualized. In the short access heavy shadowing  from anular calcification, leaflets poorly visualized. In some views there  is at least some suggestion of a density moving independently from the  leaflets, possibly complex  calcification but cannot exlcude a vegetation. If clinical suspicion for  endocarditis consider TEE. There is no significant aortic valve  dysfunction. . The aortic valve is abnormal. Aortic valve regurgitation is  not visualized. No aortic stenosis is  present.  5. Indeterminant PASP, inadequate TR jet.  6. The inferior vena cava is normal in size with greater than 50%  respiratory variability, suggesting right atrial pressure of 3 mmHg.   Antimicrobials:   Cefepime 4/3> 4/5  Ceftriaxone 4/5 >  Subjective: Patient seen and evaluated today with no new acute complaints or concerns. No acute concerns or events noted overnight.  Objective: Vitals:   07/14/19 2023 07/14/19 2140 07/15/19 0544 07/15/19 0812  BP:  136/74 108/67   Pulse:  84 84   Resp:  19 18   Temp:  98 F (36.7 C) 98.9 F (37.2 C)   TempSrc:      SpO2: 93% 96% 96% 95%  Weight:      Height:        Intake/Output Summary (Last 24 hours) at 07/15/2019 1154 Last data filed at 07/15/2019 0200 Gross per 24 hour  Intake --  Output 1950 ml  Net -1950 ml   Filed Weights   07/09/19 0829 07/09/19  1414 07/10/19 0500  Weight: 89 kg 93.7 kg 93.5 kg    Examination:  General exam: Appears calm and comfortable  Respiratory system: Clear to auscultation. Respiratory effort normal. Cardiovascular system: S1 & S2 heard, RRR. No JVD, murmurs, rubs, gallops or clicks. No pedal edema. Gastrointestinal system: Abdomen is nondistended, soft and nontender. No organomegaly or masses felt. Normal bowel sounds heard.  J-tube present, clean, dry and  intact Central nervous system: Alert and oriented. No focal neurological deficits. Extremities: Symmetric 5 x 5 power. Skin: No rashes, lesions or ulcers Psychiatry: Judgement and insight appear normal. Mood & affect appropriate.     Data Reviewed: I have personally reviewed following labs and imaging studies  CBC: Recent Labs  Lab 07/09/19 0844 07/09/19 0844 07/10/19 0431 07/10/19 1400 07/10/19 2044 07/11/19 0411 07/12/19 0556 07/13/19 0405 07/14/19 0408  WBC 0.8*   < > 2.1*  --  1.9* 2.0* 2.3* 2.2* 2.6*  NEUTROABS 0.7*  --  1.7  --   --   --   --   --   --   HGB 8.6*   < > 7.4*   < > 7.1* 7.1* 8.6* 9.0* 10.0*  HCT 25.4*   < > 22.1*   < > 20.9* 21.5* 25.4* 26.8* 29.6*  MCV 89.4   < > 90.2  --  90.9 90.3 89.4 88.2 88.1  PLT 126*   < > 113*  --  122* 118* 133* 141* 140*   < > = values in this interval not displayed.   Basic Metabolic Panel: Recent Labs  Lab 07/09/19 0844 07/09/19 0844 07/10/19 0431 07/10/19 0431 07/11/19 0411 07/12/19 0556 07/13/19 0405 07/14/19 0408 07/15/19 0600  NA 126*   < > 130*   < > 131* 131* 129* 129* 128*  K 3.7   < > 3.9   < > 3.5 3.8 3.3* 3.6 3.6  CL 90*   < > 96*   < > 96* 96* 96* 94* 93*  CO2 23   < > 27   < > 27 26 25 25 28   GLUCOSE 267*   < > 263*   < > 183* 157* 206* 194* 229*  BUN 14   < > 14   < > 10 10 8 8 9   CREATININE 0.61   < > 0.49*   < > 0.45* 0.47* 0.43* 0.50* 0.52*  CALCIUM 7.8*   < > 7.7*   < > 7.7* 7.7* 7.5* 7.6* 7.4*  MG 1.6*  --  1.7  --   --   --   --  1.8 1.7   < > = values in this interval not displayed.   GFR: Estimated Creatinine Clearance: 95.7 mL/min (A) (by C-G formula based on SCr of 0.52 mg/dL (L)). Liver Function Tests: Recent Labs  Lab 07/09/19 0844 07/10/19 0431  AST 28 15  ALT 24 18  ALKPHOS 111 90  BILITOT 0.6 0.3  PROT 5.6* 4.9*  ALBUMIN 2.4* 2.1*   No results for input(s): LIPASE, AMYLASE in the last 168 hours. No results for input(s): AMMONIA in the last 168 hours. Coagulation  Profile: Recent Labs  Lab 07/09/19 0844  INR 1.2   Cardiac Enzymes: No results for input(s): CKTOTAL, CKMB, CKMBINDEX, TROPONINI in the last 168 hours. BNP (last 3 results) No results for input(s): PROBNP in the last 8760 hours. HbA1C: No results for input(s): HGBA1C in the last 72 hours. CBG: Recent Labs  Lab 07/14/19 2110 07/15/19 0010 07/15/19 0213 07/15/19  0410 07/15/19 0824  GLUCAP 231* 189* 192* 215* 248*   Lipid Profile: No results for input(s): CHOL, HDL, LDLCALC, TRIG, CHOLHDL, LDLDIRECT in the last 72 hours. Thyroid Function Tests: Recent Labs    07/15/19 0814  TSH 1.270   Anemia Panel: No results for input(s): VITAMINB12, FOLATE, FERRITIN, TIBC, IRON, RETICCTPCT in the last 72 hours. Sepsis Labs: Recent Labs  Lab 07/09/19 0844 07/09/19 1052 07/09/19 1322 07/09/19 1602  LATICACIDVEN 4.6* 5.0* 3.3* 2.7*    Recent Results (from the past 240 hour(s))  Blood Culture (routine x 2)     Status: Abnormal   Collection Time: 07/09/19  8:44 AM   Specimen: BLOOD RIGHT FOREARM  Result Value Ref Range Status   Specimen Description   Final    BLOOD RIGHT FOREARM BOTTLES DRAWN AEROBIC AND ANAEROBIC Performed at [facility], [address] [place], [place] [address]    Special Requests   Final    Blood Culture adequate volume Performed at [facility], [address], [place], [place] [address]    Culture  Setup Time   Final    GRAM POSITIVE COCCI IN CHAINS ANAEROBIC BOTTLE ONLY CRITICAL RESULT CALLED TO, READ BACK BY AND VERIFIED WITH: RN Mallie Snooks [id number] AT 726 AM BY CM Performed at [facility], [place] [address] [place], [place] [address]    Culture (A)  Final    STREPTOCOCCUS GROUP C CORRECTED ON 04/07 AT 0845: PREVIOUSLY REPORTED AS STREPTOCOCCUS CONSTELLATUS   Report Status 07/13/2019 FINAL  Final   Organism ID, Bacteria STREPTOCOCCUS GROUP C  Final      Susceptibility   Streptococcus group c - MIC*    PENICILLIN INTERMEDIATE Intermediate      CEFTRIAXONE 1 SENSITIVE Sensitive     ERYTHROMYCIN <=0.12 SENSITIVE Sensitive     LEVOFLOXACIN <=0.25 SENSITIVE Sensitive     VANCOMYCIN 0.5 SENSITIVE Sensitive     * STREPTOCOCCUS GROUP C CORRECTED ON 04/07 AT 0845: PREVIOUSLY REPORTED AS STREPTOCOCCUS CONSTELLATUS  Blood Culture (routine x 2)     Status: Abnormal (Preliminary result)   Collection Time: 07/09/19  8:44 AM   Specimen: Right Antecubital; Blood  Result Value Ref Range Status   Specimen Description   Final    RIGHT ANTECUBITAL BOTTLES DRAWN AEROBIC AND ANAEROBIC Performed at [facility], [address], [place], [place] [address]    Special Requests   Final    Blood Culture adequate volume Performed at [facility], [address] [place], [place] [address]    Culture  Setup Time   Final    GRAM POSITIVE COCCI Gram Stain Report Called to,Read Back By and Verified With: BONDURANT,R @ [id number] ON 07/11/19 BY JUW AEROBIC BOTTLE ONLY GS DONE @ [facility] GRAM POSITIVE COCCI GRAM POSITIVE RODS Gram Stain Report Called to,Read Back By and Verified With:  MARY HOWARTON,RN @[id number]  07/11/2019 KAY ANAEROBIC BOTTLE ONLY Performed at [facility], [address], [place], [place] [address]    Culture (A)  Final    STREPTOCOCCUS GROUP C SUSCEPTIBILITIES PERFORMED ON PREVIOUS CULTURE WITHIN THE LAST 5 DAYS. GRAM POSITIVE RODS    Report Status PENDING  Incomplete  Blood Culture ID Panel (Reflexed)     Status: Abnormal   Collection Time: 07/09/19  8:44 AM  Result Value Ref Range Status   Enterococcus species NOT DETECTED NOT DETECTED Final   Listeria monocytogenes NOT DETECTED NOT DETECTED Final   Staphylococcus species NOT DETECTED NOT DETECTED Final   Staphylococcus aureus (BCID) NOT DETECTED  NOT DETECTED Final   Streptococcus species DETECTED (A) NOT DETECTED Final    Comment: Not Enterococcus species, Streptococcus agalactiae, Streptococcus pyogenes, or Streptococcus pneumoniae. CRITICAL RESULT CALLED TO, READ BACK BY AND VERIFIED  WITH: RN R BONDURANT [id number] AT 59 AM BY CM    Streptococcus agalactiae NOT DETECTED NOT DETECTED Final   Streptococcus pneumoniae NOT DETECTED NOT DETECTED Final   Streptococcus pyogenes NOT DETECTED NOT DETECTED Final   Acinetobacter baumannii NOT DETECTED NOT DETECTED Final   Enterobacteriaceae species NOT DETECTED NOT DETECTED Final   Enterobacter cloacae complex NOT DETECTED NOT DETECTED Final   Escherichia coli NOT DETECTED NOT DETECTED Final   Klebsiella oxytoca NOT DETECTED NOT DETECTED Final   Klebsiella pneumoniae NOT DETECTED NOT DETECTED Final   Proteus species NOT DETECTED NOT DETECTED Final   Serratia marcescens NOT DETECTED NOT DETECTED Final   Haemophilus influenzae NOT DETECTED NOT DETECTED Final   Neisseria meningitidis NOT DETECTED NOT DETECTED Final   Pseudomonas aeruginosa NOT DETECTED NOT DETECTED Final   Candida albicans NOT DETECTED NOT DETECTED Final   Candida glabrata NOT DETECTED NOT DETECTED Final   Candida krusei NOT DETECTED NOT DETECTED Final   Candida parapsilosis NOT DETECTED NOT DETECTED Final   Candida tropicalis NOT DETECTED NOT DETECTED Final    Comment: Performed at [facility], [place] [address], [place], [place] [address]  Urine culture     Status: None   Collection Time: 07/09/19  8:50 AM   Specimen: Urine, Catheterized  Result Value Ref Range Status   Specimen Description   Final    URINE, CATHETERIZED Performed at [facility], [address], [place], [place] [address]    Special Requests   Final    NONE Performed at [facility], [address], [place], [place] [address]    Culture   Final    NO GROWTH Performed at [facility], [place] [address], [place], [place] [address]    Report Status 07/10/2019 FINAL  Final  SARS CORONAVIRUS 2 (TAT 6-24 HRS) Nasopharyngeal Nasopharyngeal Swab     Status: None   Collection Time: 07/09/19  9:08 AM   Specimen: Nasopharyngeal Swab  Result Value Ref Range Status   SARS Coronavirus 2  NEGATIVE NEGATIVE Final    Comment: (NOTE) SARS-CoV-2 target nucleic acids are NOT DETECTED. The SARS-CoV-2 RNA is generally detectable in upper and lower respiratory specimens during the acute phase of infection. Negative results do not preclude SARS-CoV-2 infection, do not rule out co-infections with other pathogens, and should not be used as the sole basis for treatment or other patient management decisions. Negative results must be combined with clinical observations, patient history, and epidemiological information. The expected result is Negative. Fact Sheet for Patients: SugarRoll.be Fact Sheet for Healthcare Providers: https://www.woods-mathews.com/ This test is not yet approved or cleared by the [place] FDA and  has been authorized for detection and/or diagnosis of SARS-CoV-2 by FDA under an Emergency Use Authorization (EUA). This EUA will remain  in effect (meaning this test can be used) for the duration of the COVID-19 declaration under Section 56 4(b)(1) of the Act, 21 U.S.C. section 360bbb-3(b)(1), unless the authorization is terminated or revoked sooner. Performed at [facility], [place] [address], [place], [place] [address]   Respiratory Panel by RT PCR (Flu A&B, Covid) - Nasopharyngeal Swab     Status: None   Collection Time: 07/09/19 11:45 AM   Specimen: Nasopharyngeal Swab  Result Value Ref Range Status  SARS Coronavirus 2 by RT PCR NEGATIVE NEGATIVE Final    Comment: (NOTE) SARS-CoV-2 target nucleic acids are NOT DETECTED. The SARS-CoV-2 RNA is generally detectable in upper respiratoy specimens during the acute phase of infection. The lowest concentration of SARS-CoV-2 viral copies this assay can detect is 131 copies/mL. A negative result does not preclude SARS-Cov-2 infection and should not be used as the sole basis for treatment or other patient management decisions. A negative result may occur with   improper specimen collection/handling, submission of specimen other than nasopharyngeal swab, presence of viral mutation(s) within the areas targeted by this assay, and inadequate number of viral copies (<131 copies/mL). A negative result must be combined with clinical observations, patient history, and epidemiological information. The expected result is Negative. Fact Sheet for Patients:  PinkCheek.be Fact Sheet for Healthcare Providers:  GravelBags.it This test is not yet ap proved or cleared by the [place] FDA and  has been authorized for detection and/or diagnosis of SARS-CoV-2 by FDA under an Emergency Use Authorization (EUA). This EUA will remain  in effect (meaning this test can be used) for the duration of the COVID-19 declaration under Section 564(b)(1) of the Act, 21 U.S.C. section 360bbb-3(b)(1), unless the authorization is terminated or revoked sooner.    Influenza A by PCR NEGATIVE NEGATIVE Final   Influenza B by PCR NEGATIVE NEGATIVE Final    Comment: (NOTE) The Xpert Xpress SARS-CoV-2/FLU/RSV assay is intended as an aid in  the diagnosis of influenza from Nasopharyngeal swab specimens and  should not be used as a sole basis for treatment. Nasal washings and  aspirates are unacceptable for Xpert Xpress SARS-CoV-2/FLU/RSV  testing. Fact Sheet for Patients: PinkCheek.be Fact Sheet for Healthcare Providers: GravelBags.it This test is not yet approved or cleared by the [place] FDA and  has been authorized for detection and/or diagnosis of SARS-CoV-2 by  FDA under an Emergency Use Authorization (EUA). This EUA will remain  in effect (meaning this test can be used) for the duration of the  Covid-19 declaration under Section 564(b)(1) of the Act, 21  U.S.C. section 360bbb-3(b)(1), unless the authorization is  terminated or revoked. Performed at  [facility], [address] [place], [place] [address]   MRSA PCR Screening     Status: None   Collection Time: 07/09/19  3:09 PM   Specimen: Nasal Mucosa; Nasopharyngeal  Result Value Ref Range Status   MRSA by PCR NEGATIVE NEGATIVE Final    Comment:        The GeneXpert MRSA Assay (FDA approved for NASAL specimens only), is one component of a comprehensive MRSA colonization surveillance program. It is not intended to diagnose MRSA infection nor to guide or monitor treatment for MRSA infections. Performed at [facility], [address] [place], [place] [address]   Culture, blood (routine x 2)     Status: None (Preliminary result)   Collection Time: 07/12/19  9:47 AM   Specimen: BLOOD  Result Value Ref Range Status   Specimen Description BLOOD LEFT ANTECUBITAL  Final   Special Requests   Final    Blood Culture results may not be optimal due to an inadequate volume of blood received in culture bottles   Culture   Final    NO GROWTH 3 DAYS Performed at [facility], [address], [place], [place] [address]    Report Status PENDING  Incomplete  Culture, blood (routine x 2)     Status: None (Preliminary result)   Collection Time: 07/12/19  9:47 AM   Specimen: BLOOD LEFT HAND  Result Value Ref Range Status   Specimen Description BLOOD LEFT HAND  Final   Special Requests   Final    Blood Culture results may not be optimal due to an inadequate volume of blood received in culture bottles   Culture   Final    NO GROWTH 3 DAYS Performed at [facility], [address] [place], [place] [address]    Report Status PENDING  Incomplete         Radiology Studies: DG Abd 1 View  Result Date: 07/13/2019 CLINICAL DATA:  Abdominal pain, pain around PEG tube EXAM: ABDOMEN - 1 VIEW COMPARISON:  CT abdomen pelvis, 07/09/2019 FINDINGS: The bowel gas pattern is normal. No large burden of stool. Left hemiabdomen percutaneous jejunostomy tube. No radio-opaque calculi or other significant  radiographic abnormality are seen. IMPRESSION: Nonobstructive pattern of bowel gas. No large burden of stool. No free air on supine radiographs. Left hemiabdomen percutaneous jejunostomy. Electronically Signed   By: Eddie Candle M.D.   On: 07/13/2019 15:20   DG Chest Port 1 View  Result Date: 07/13/2019 CLINICAL DATA:  Recent Port-A-Cath removal. EXAM: PORTABLE CHEST 1 VIEW COMPARISON:  July 09, 2019 FINDINGS: The right-sided venous Port-A-Cath seen on the prior study has been removed. Mild atelectasis and/or infiltrate is seen within the retrocardiac region of the left lung base. There is no evidence of a pleural effusion or pneumothorax. The heart size and mediastinal contours are within normal limits. A radiopaque fusion plate and screws are seen overlying the cervical spine. Radiopaque surgical pins are seen overlying the right humeral head. Multilevel degenerative changes seen throughout the thoracic spine. IMPRESSION: 1. Mild left basilar atelectasis and/or infiltrate. 2. Postoperative changes within the cervical spine. Electronically Signed   By: Virgina Norfolk M.D.   On: 07/13/2019 19:28   [place] EKG SITE RITE  Result Date: 07/14/2019 If Site Rite image not attached, placement could not be confirmed due to current cardiac rhythm.       Scheduled Meds: . ALPRAZolam  0.5 mg Per J Tube BID  . Chlorhexidine Gluconate Cloth  6 each Topical Daily  . famotidine  20 mg Per Tube Daily  . feeding supplement (PRO-STAT SUGAR FREE 64)  30 mL Per J Tube BID  . FLUoxetine  20 mg Per Tube Daily  . HYDROcodone-homatropine  5 mL Per Tube Q6H  . insulin aspart  0-15 Units Subcutaneous Q4H  . lidocaine (PF)  30 mL Intradermal Once  . ondansetron (ZOFRAN) IV  4 mg Intravenous Q6H  . pantoprazole (PROTONIX) IV  40 mg Intravenous Q12H  . sodium chloride flush  10-40 mL Intracatheter Q12H  . sodium chloride  2 g Per J Tube TID WC   Continuous Infusions: . cefTRIAXone (ROCEPHIN)  IV 2 g (07/14/19 1735)  .  feeding supplement (OSMOLITE 1.5 CAL) 1,000 mL (07/14/19 1618)  . norepinephrine (LEVOPHED) Adult infusion Stopped (07/09/19 1530)     LOS: 6 days    Time spent: 35 minutes    Caysen Whang D Manuella Ghazi, DO [facility]  If 7PM-7AM, please contact night-coverage [email or web address] 07/15/2019, 11:54 AM

## 2019-07-15 NOTE — Progress Notes (Signed)
Inpatient Diabetes Program Recommendations  AACE/ADA: New Consensus Statement on Inpatient Glycemic Control (2015)  Target Ranges:  Prepandial:   less than 140 mg/dL      Peak postprandial:   less than 180 mg/dL (1-2 hours)      Critically ill patients:  140 - 180 mg/dL   Lab Results  Component Value Date   GLUCAP 248 (H) 07/15/2019   HGBA1C 8.0 (H) 07/10/2019    Review of Glycemic Control Results for Kurt Duran, Kurt Duran (MRN [id number]) as of 07/15/2019 10:52  Ref. Range 07/14/2019 21:10 07/15/2019 00:10 07/15/2019 02:13 07/15/2019 04:10 07/15/2019 08:24  Glucose-Capillary Latest Ref Range: 70 - 99 mg/dL 231 (H) 189 (H) 192 (H) 215 (H) 248 (H)   Diabetes history: DM 2 Outpatient Diabetes medications: Glipizide 10 mg qam, 5 mg qpm, Metformin 1000 mg bid Current orders for Inpatient glycemic control:  Novolog 0-15 units Q4 hours  Inpatient Diabetes Program Recommendations:    Pt on tube feeds. Glucose trends in 200's consistently.   Consider Novolog 3 units Q4 hours Tube Feed Coverage (Hold if tube feeds are stopped or held).  Thanks,  Tama Headings RN, MSN, BC-ADM Inpatient Diabetes Coordinator Team Pager [phone] (8a-5p)

## 2019-07-15 NOTE — Progress Notes (Signed)
Peripherally Inserted Central Catheter Placement  The IV Nurse has discussed with the patient and/or persons authorized to consent for the patient, the purpose of this procedure and the potential benefits and risks involved with this procedure.  The benefits include less needle sticks, lab draws from the catheter, and the patient may be discharged home with the catheter. Risks include, but not limited to, infection, bleeding, blood clot (thrombus formation), and puncture of an artery; nerve damage and irregular heartbeat and possibility to perform a PICC exchange if needed/ordered by physician.  Alternatives to this procedure were also discussed.  Bard Power PICC patient education guide, fact sheet on infection prevention and patient information card has been provided to patient /or left at bedside.    PICC Placement Documentation  PICC Single Lumen 123456 PICC Right Basilic 41 cm 0 cm (Active)  Indication for Insertion or Continuance of Line Home intravenous therapies (PICC only) 07/15/19 1026  Exposed Catheter (cm) 0 cm 07/15/19 1026  Site Assessment Clean;Dry;Intact 07/15/19 1026  Line Status Flushed;Blood return noted 07/15/19 1026  Dressing Type Transparent 07/15/19 1026  Dressing Status Clean;Dry;Intact;Antimicrobial disc in place;Other (Comment) 07/15/19 1026  Dressing Intervention New dressing 07/15/19 1026  Dressing Change Due 07/22/19 07/15/19 1026   Wife signed consent at bedside per patient request    Synthia Innocent 07/15/2019, 10:26 AM

## 2019-07-16 DIAGNOSIS — Z7401 Bed confinement status: Secondary | ICD-10-CM | POA: Diagnosis not present

## 2019-07-16 DIAGNOSIS — K9423 Gastrostomy malfunction: Secondary | ICD-10-CM | POA: Diagnosis not present

## 2019-07-16 DIAGNOSIS — Z841 Family history of disorders of kidney and ureter: Secondary | ICD-10-CM | POA: Diagnosis not present

## 2019-07-16 DIAGNOSIS — K219 Gastro-esophageal reflux disease without esophagitis: Secondary | ICD-10-CM | POA: Diagnosis not present

## 2019-07-16 DIAGNOSIS — E113293 Type 2 diabetes mellitus with mild nonproliferative diabetic retinopathy without macular edema, bilateral: Secondary | ICD-10-CM | POA: Diagnosis present

## 2019-07-16 DIAGNOSIS — K2100 Gastro-esophageal reflux disease with esophagitis, without bleeding: Secondary | ICD-10-CM | POA: Diagnosis present

## 2019-07-16 DIAGNOSIS — Z8249 Family history of ischemic heart disease and other diseases of the circulatory system: Secondary | ICD-10-CM | POA: Diagnosis not present

## 2019-07-16 DIAGNOSIS — E119 Type 2 diabetes mellitus without complications: Secondary | ICD-10-CM

## 2019-07-16 DIAGNOSIS — F319 Bipolar disorder, unspecified: Secondary | ICD-10-CM | POA: Diagnosis not present

## 2019-07-16 DIAGNOSIS — Z743 Need for continuous supervision: Secondary | ICD-10-CM | POA: Diagnosis not present

## 2019-07-16 DIAGNOSIS — R5081 Fever presenting with conditions classified elsewhere: Secondary | ICD-10-CM | POA: Diagnosis not present

## 2019-07-16 DIAGNOSIS — R0902 Hypoxemia: Secondary | ICD-10-CM | POA: Diagnosis not present

## 2019-07-16 DIAGNOSIS — Z9889 Other specified postprocedural states: Secondary | ICD-10-CM | POA: Diagnosis not present

## 2019-07-16 DIAGNOSIS — E871 Hypo-osmolality and hyponatremia: Secondary | ICD-10-CM

## 2019-07-16 DIAGNOSIS — M1990 Unspecified osteoarthritis, unspecified site: Secondary | ICD-10-CM | POA: Diagnosis not present

## 2019-07-16 DIAGNOSIS — Z931 Gastrostomy status: Secondary | ICD-10-CM

## 2019-07-16 DIAGNOSIS — E860 Dehydration: Secondary | ICD-10-CM | POA: Diagnosis present

## 2019-07-16 DIAGNOSIS — R2689 Other abnormalities of gait and mobility: Secondary | ICD-10-CM | POA: Diagnosis not present

## 2019-07-16 DIAGNOSIS — Z87891 Personal history of nicotine dependence: Secondary | ICD-10-CM | POA: Diagnosis not present

## 2019-07-16 DIAGNOSIS — F419 Anxiety disorder, unspecified: Secondary | ICD-10-CM | POA: Diagnosis not present

## 2019-07-16 DIAGNOSIS — I959 Hypotension, unspecified: Secondary | ICD-10-CM | POA: Diagnosis not present

## 2019-07-16 DIAGNOSIS — R29898 Other symptoms and signs involving the musculoskeletal system: Secondary | ICD-10-CM | POA: Diagnosis not present

## 2019-07-16 DIAGNOSIS — Z934 Other artificial openings of gastrointestinal tract status: Secondary | ICD-10-CM | POA: Diagnosis not present

## 2019-07-16 DIAGNOSIS — I471 Supraventricular tachycardia: Secondary | ICD-10-CM | POA: Diagnosis present

## 2019-07-16 DIAGNOSIS — M109 Gout, unspecified: Secondary | ICD-10-CM | POA: Diagnosis present

## 2019-07-16 DIAGNOSIS — Z91012 Allergy to eggs: Secondary | ICD-10-CM | POA: Diagnosis not present

## 2019-07-16 DIAGNOSIS — A419 Sepsis, unspecified organism: Secondary | ICD-10-CM | POA: Diagnosis not present

## 2019-07-16 DIAGNOSIS — Z7984 Long term (current) use of oral hypoglycemic drugs: Secondary | ICD-10-CM | POA: Diagnosis not present

## 2019-07-16 DIAGNOSIS — Z8501 Personal history of malignant neoplasm of esophagus: Secondary | ICD-10-CM | POA: Diagnosis not present

## 2019-07-16 DIAGNOSIS — R0602 Shortness of breath: Secondary | ICD-10-CM | POA: Diagnosis not present

## 2019-07-16 DIAGNOSIS — E7800 Pure hypercholesterolemia, unspecified: Secondary | ICD-10-CM | POA: Diagnosis not present

## 2019-07-16 DIAGNOSIS — J449 Chronic obstructive pulmonary disease, unspecified: Secondary | ICD-10-CM | POA: Diagnosis present

## 2019-07-16 DIAGNOSIS — E876 Hypokalemia: Secondary | ICD-10-CM | POA: Diagnosis present

## 2019-07-16 DIAGNOSIS — Z95828 Presence of other vascular implants and grafts: Secondary | ICD-10-CM | POA: Diagnosis not present

## 2019-07-16 DIAGNOSIS — D709 Neutropenia, unspecified: Secondary | ICD-10-CM | POA: Diagnosis not present

## 2019-07-16 DIAGNOSIS — M6281 Muscle weakness (generalized): Secondary | ICD-10-CM | POA: Diagnosis not present

## 2019-07-16 DIAGNOSIS — R41841 Cognitive communication deficit: Secondary | ICD-10-CM | POA: Diagnosis not present

## 2019-07-16 DIAGNOSIS — Z4682 Encounter for fitting and adjustment of non-vascular catheter: Secondary | ICD-10-CM | POA: Diagnosis not present

## 2019-07-16 DIAGNOSIS — R7881 Bacteremia: Secondary | ICD-10-CM | POA: Diagnosis present

## 2019-07-16 DIAGNOSIS — Z792 Long term (current) use of antibiotics: Secondary | ICD-10-CM | POA: Diagnosis not present

## 2019-07-16 DIAGNOSIS — Z79899 Other long term (current) drug therapy: Secondary | ICD-10-CM | POA: Diagnosis not present

## 2019-07-16 DIAGNOSIS — J698 Pneumonitis due to inhalation of other solids and liquids: Secondary | ICD-10-CM | POA: Diagnosis not present

## 2019-07-16 DIAGNOSIS — J9601 Acute respiratory failure with hypoxia: Secondary | ICD-10-CM | POA: Diagnosis present

## 2019-07-16 DIAGNOSIS — Z20822 Contact with and (suspected) exposure to covid-19: Secondary | ICD-10-CM | POA: Diagnosis present

## 2019-07-16 DIAGNOSIS — C159 Malignant neoplasm of esophagus, unspecified: Secondary | ICD-10-CM | POA: Diagnosis not present

## 2019-07-16 DIAGNOSIS — J690 Pneumonitis due to inhalation of food and vomit: Secondary | ICD-10-CM | POA: Diagnosis present

## 2019-07-16 DIAGNOSIS — C155 Malignant neoplasm of lower third of esophagus: Secondary | ICD-10-CM | POA: Diagnosis present

## 2019-07-16 DIAGNOSIS — I10 Essential (primary) hypertension: Secondary | ICD-10-CM | POA: Diagnosis present

## 2019-07-16 DIAGNOSIS — Z66 Do not resuscitate: Secondary | ICD-10-CM | POA: Diagnosis present

## 2019-07-16 DIAGNOSIS — K920 Hematemesis: Secondary | ICD-10-CM | POA: Diagnosis present

## 2019-07-16 LAB — CBC
HCT: 31.6 % — ABNORMAL LOW (ref 39.0–52.0)
Hemoglobin: 10.7 g/dL — ABNORMAL LOW (ref 13.0–17.0)
MCH: 30.4 pg (ref 26.0–34.0)
MCHC: 33.9 g/dL (ref 30.0–36.0)
MCV: 89.8 fL (ref 80.0–100.0)
Platelets: 132 10*3/uL — ABNORMAL LOW (ref 150–400)
RBC: 3.52 MIL/uL — ABNORMAL LOW (ref 4.22–5.81)
RDW: 14.2 % (ref 11.5–15.5)
WBC: 3.3 10*3/uL — ABNORMAL LOW (ref 4.0–10.5)
nRBC: 0 % (ref 0.0–0.2)

## 2019-07-16 LAB — GLUCOSE, CAPILLARY
Glucose-Capillary: 168 mg/dL — ABNORMAL HIGH (ref 70–99)
Glucose-Capillary: 176 mg/dL — ABNORMAL HIGH (ref 70–99)
Glucose-Capillary: 177 mg/dL — ABNORMAL HIGH (ref 70–99)
Glucose-Capillary: 215 mg/dL — ABNORMAL HIGH (ref 70–99)
Glucose-Capillary: 238 mg/dL — ABNORMAL HIGH (ref 70–99)

## 2019-07-16 LAB — BASIC METABOLIC PANEL
Anion gap: 11 (ref 5–15)
BUN: 10 mg/dL (ref 8–23)
CO2: 29 mmol/L (ref 22–32)
Calcium: 8 mg/dL — ABNORMAL LOW (ref 8.9–10.3)
Chloride: 92 mmol/L — ABNORMAL LOW (ref 98–111)
Creatinine, Ser: 0.54 mg/dL — ABNORMAL LOW (ref 0.61–1.24)
GFR calc Af Amer: >60 mL/min (ref >60)
GFR calc non Af Amer: >60 mL/min (ref >60)
Glucose, Bld: 165 mg/dL — ABNORMAL HIGH (ref 70–99)
Potassium: 3.8 mmol/L (ref 3.5–5.1)
Sodium: 132 mmol/L — ABNORMAL LOW (ref 135–145)

## 2019-07-16 LAB — MAGNESIUM: Magnesium: 1.8 mg/dL (ref 1.7–2.4)

## 2019-07-16 MED ORDER — HEPARIN SOD (PORK) LOCK FLUSH 100 UNIT/ML IV SOLN
500.0000 [IU] | Freq: Once | INTRAVENOUS | Status: AC
  Administered 2019-07-16: 12:00:00 500 [IU] via INTRAVENOUS
  Filled 2019-07-16: qty 5

## 2019-07-16 MED ORDER — PROCHLORPERAZINE MALEATE 10 MG PO TABS: 10.0000 mg | ORAL_TABLET | Freq: Four times a day (QID) | ORAL | 1 refills | Status: AC | PRN

## 2019-07-16 MED ORDER — FREE WATER: 250.0000 mL | Freq: Three times a day (TID) | 0 refills | Status: AC

## 2019-07-16 MED ORDER — GLIPIZIDE 10 MG PO TABS: 5.0000 mg | ORAL_TABLET | Freq: Two times a day (BID) | ORAL | Status: AC

## 2019-07-16 MED ORDER — ALPRAZOLAM 0.5 MG PO TABS: 0.5000 mg | ORAL_TABLET | Freq: Two times a day (BID) | ORAL | 0 refills | Status: AC

## 2019-07-16 MED ORDER — FAMOTIDINE 20 MG PO TABS: 20.0000 mg | ORAL_TABLET | Freq: Every day | ORAL | Status: AC

## 2019-07-16 MED ORDER — PANTOPRAZOLE SODIUM 40 MG PO TBEC: 40.0000 mg | DELAYED_RELEASE_TABLET | Freq: Two times a day (BID) | ORAL | Status: DC

## 2019-07-16 MED ORDER — CEFTRIAXONE IV (FOR PTA / DISCHARGE USE ONLY): 2.0000 g | INTRAVENOUS | 0 refills | Status: AC

## 2019-07-16 MED ORDER — PANTOPRAZOLE SODIUM 40 MG PO PACK: 40.0000 mg | PACK | Freq: Two times a day (BID) | ORAL | Status: AC

## 2019-07-16 MED ORDER — SODIUM CHLORIDE 1 G PO TABS: 2.0000 g | ORAL_TABLET | Freq: Three times a day (TID) | ORAL | Status: AC

## 2019-07-16 MED ORDER — FAMOTIDINE 20 MG PO TABS: 20.0000 mg | ORAL_TABLET | Freq: Every day | ORAL | Status: DC

## 2019-07-16 NOTE — Progress Notes (Signed)
Report called to [facility]. Patient transported via EMS in stable condition

## 2019-07-16 NOTE — Progress Notes (Signed)
CSW faxed over discharge summary to [facility]. Fax #: [phone]. [facility] called and scheduled.  [facility]  Clinical Social Worker  Ph: [phone]

## 2019-07-16 NOTE — Discharge Summary (Signed)
Physician Discharge Summary  RIGGIN CUTTINO NAT:[id number] DOB: [birth year] DOA: 07/09/2019  PCP: Glenda Chroman, MD  Admit date: 07/09/2019 Discharge date: 07/16/2019  Time spent: 35 minutes  Recommendations for Outpatient Follow-up:  1. Repeat CBC and basic metabolic weekly while receiving antibiotics 2. -Plan is for a total of 6 weeks of antibiotics for bacteremia. 3. -Follow-up with oncology service and PCP as an outpatient.   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  Discharged to skilled nursing facility for further care and rehabilitation.  CODE STATUS: DNR.  Diet recommendation: Continue tube feedings.  Filed Weights   07/09/19 0829 07/09/19 1414 07/10/19 0500  Weight: 89 kg 93.7 kg 93.5 kg    History of present illness:  As per H&P written by Dr. Roderic Palau On 07/09/2019  74 y.o. male with medical history significant of esophageal cancer who is receiving chemotherapy as well as radiation treatments.  Patient has an obstructive lesion and is PEG tube for nutrition/meds.  His last chemotherapy was on 3/31.  Reports of the past 10 days, he has had increasing secretions and cough.  He was prescribed scopolamine patch which caused dry mouth, but did not improve his respiratory secretions.  He has been feeling generally weak and unwell.  He came to the ER yesterday wife reports that his temperature was 100.71F prior to the visit.  During his ER stay, his labs were reassuring and he felt better and therefore discharged home.  Upon returning  home, he had worsening fever this morning developed chills.  He was brought back to the emergency room where he was found to have a temperature of 104F.  Is also noted to be hypotensive and tachycardic.  Lactic acid markedly elevated over 4.  He was started on IV fluids per sepsis protocol.  CT of the abdomen pelvis as well as chest did not show any source of sepsis.  Coronavirus test was also negative. He was noted to be neutropenic.  He has been referred for admission  Hospital Course:  1-severe sepsis with septic shock in the setting of Streptococcus Constellatus  bacteremia -Patient with excellent response to aggressive hydration and IV antibiotics. -Sepsis features resolved by time of discharge -Hemodynamically stable. -Following recommendations by infectious disease service patient will complete 6 weeks of IV antibiotics. -Port-A-Cath removal by general surgery and a PICC line placed to facilitate antibiotic treatment.  2-febrile neutropenia/pancytopenia -Patient with pancytopenia in the setting of chemotherapy treatment -Fever secondary to bacteremia -Resolved and stable at discharge. -WBC stable; will continue to monitor intermittently as an outpatient. -1 unit of PRBC was transfused on 07/11/2019; hemoglobin has remained stable since then.  Continue to follow cell blood count trend  3-hyponatremia (chronic; but worse during this admission). 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 -Free water flushes dosage has been adjusted -Patient euvolemic at discharge -Continue sodium tablets 3 times a day as instructed. -TSH within normal limits -Serum osmolality slightly low. -Sodium 132 at time of  discharge.  Patient otherwise asymptomatic.  4-type 2 diabetes -Stable and well-controlled -Resume oral hypoglycemic agents  5-esophageal adenocarcinoma -Continue outpatient follow-up by oncology  service for further chemotherapy/radiation treatments. -Per records review plan is for outpatient follow-up with cardiothoracic surgery as an outpatient to consider resection at some point. -Patient remains n.p.o. with the use of PEG-J-tube for feedings and to use as  route for medications.  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   Procedures:  See below for x-ray reports.  2D Echo: 1. Left ventricular ejection fraction, by estimation, is 55 to 60%. The  left ventricle has normal function. The left ventricle has no regional  wall motion abnormalities.  2. Right ventricular systolic function is normal. The right ventricular  size is normal. There is normal pulmonary artery systolic pressure.  3. Trivial mitral valve regurgitation. No evidence of mitral stenosis.  4. Aortic valve poorly visualized. In the short access heavy shadowing  from anular calcification, leaflets poorly visualized. In some views there  is at least some suggestion of a density moving independently from the  leaflets, possibly complex  calcification but cannot exlcude a vegetation. If clinical suspicion for  endocarditis consider TEE. There is no significant aortic valve  dysfunction. . The aortic valve is abnormal. Aortic valve regurgitation is  not visualized. No aortic stenosis is  present.  5. Indeterminant PASP, inadequate TR jet.  6. The inferior vena cava is normal in size with greater than 50%  respiratory variability, suggesting right atrial pressure of 3 mmHg.   Consultations:  Gastroenterology service  Oncology service  Cardiology  Infectious disease service (Dr. Johnnye Sima).  Discharge Exam: Vitals:   07/15/19 2145 07/16/19 0555  BP: 127/74 132/77  Pulse: 84 90  Resp: 20 18  Temp:  98.8 F (37.1 C)  SpO2: 96% 95%    General: Afebrile, in no acute distress; still  experiencing intermittent episodes of vomiting, no frank hematemesis.  No abdominal pain and a stable electrolytes at discharge.   Cardiovascular: Positive systolic ejection murmur, no rubs, no gallops, no JVD. Respiratory: Good air movement bilaterally; no wheezing, no using accessory muscles.  Good oxygen saturation on room air. Abdomen: PEG-J tube in place, soft, nontender, positive bowel sounds bilaterally Extremities: No cyanosis, no clubbing.  Discharge Instructions   Discharge Instructions    Diet - low sodium heart healthy   Complete by: As directed    Discharge instructions   Complete by: As directed    Rehabilitation and conditioning as per the skilled nursing facility protocol. Repeat basic metabolic panel in 1 week to assess electrolytes and renal function. Continue antibiotics as instructed for a total of 6 weeks. Follow-up with primary care physician in 2 weeks after discharge from the skilled nursing facility Follow-up with oncology service as previously instructed (they will determine further treatment with chemotherapeutic agents).   Home infusion instructions   Complete by: As directed    Instructions: Flushing of vascular access device: 0.9% NaCl pre/post medication administration and prn patency; Heparin 100 u/ml, 26m for implanted ports and Heparin 10u/ml, 522mfor all other central venous catheters.     Allergies as of 07/16/2019      Reactions   Eggs Or Egg-derived Products Nausea And Vomiting      Medication List    STOP taking these medications   atorvastatin 40 MG tablet Commonly known as: LIPITOR   scopolamine 1 MG/3DAYS Commonly known as: TRANSDERM-SCOP  TAKE these medications   acetaminophen 325 MG tablet Commonly known as: TYLENOL Place 2 tablets (650 mg total) into feeding tube every 6 (six) hours as needed for mild pain (or Fever >/= 101).   albuterol 108 (90 Base) MCG/ACT inhaler Commonly known as: VENTOLIN HFA Inhale 2 puffs into the  lungs every 4 (four) hours as needed for wheezing or shortness of breath.   albuterol (2.5 MG/3ML) 0.083% nebulizer solution Commonly known as: PROVENTIL Take 2.5 mg by nebulization every 6 (six) hours as needed for wheezing or shortness of breath.   ALPRAZolam 0.5 MG tablet Commonly known as: XANAX 1 tablet (0.5 mg total) by Per J Tube route 2 (two) times daily.   CARBOPLATIN IV Inject into the vein once a week.   cefTRIAXone  IVPB Commonly known as: ROCEPHIN Inject 2 g into the vein daily. Indication:   bacteremia Last Day of Therapy:  08/22/19 Labs - Once weekly:  CBC/D and BMP, Labs - Every other week:  ESR and CRP   famotidine 20 MG tablet Commonly known as: PEPCID Place 1 tablet (20 mg total) into feeding tube at bedtime. Start taking on: July 17, 2019 What changed: when to take this   feeding supplement (OSMOLITE 1.5 CAL) Liqd Place 1,000 mLs into feeding tube continuous.   feeding supplement (PRO-STAT SUGAR FREE 64) Liqd 30 mLs by Per J Tube route 2 (two) times daily.   FLUoxetine 20 MG capsule Commonly known as: PROZAC Place 1 capsule (20 mg total) into feeding tube daily.   free water Soln Place 250 mLs into feeding tube every 8 (eight) hours. What changed: how much to take   glipiZIDE 10 MG tablet Commonly known as: GLUCOTROL Place 0.5-1 tablets (5-10 mg total) into feeding tube 2 (two) times daily before a meal. Take 10 mg in the morning and 5 mg in the evening   lidocaine-prilocaine cream Commonly known as: EMLA Apply a pea-sized amount to port a cath site and cover with plastic wrap one hour prior to chemotherapy appointments   metFORMIN 1000 MG tablet Commonly known as: GLUCOPHAGE 1 tablet (1,000 mg total) by Per J Tube route 2 (two) times daily.   ondansetron 4 MG disintegrating tablet Commonly known as: ZOFRAN-ODT Take 4 mg by mouth every 6 (six) hours as needed.   PACLITAXEL IV Inject 50 mg/m2 into the vein once a week.   pantoprazole  sodium 40 mg/20 mL Pack Commonly known as: PROTONIX Place 20 mLs (40 mg total) into feeding tube 2 (two) times daily. What changed: when to take this   prochlorperazine 10 MG tablet Commonly known as: COMPAZINE Take 1 tablet (10 mg total) by mouth every 6 (six) hours as needed (given per tube for Nausea or vomiting). What changed: reasons to take this   promethazine 25 MG suppository Commonly known as: PHENERGAN Place 25 mg rectally every 6 (six) hours as needed.   sodium chloride 1 g tablet 2 tablets (2 g total) by Per J Tube route 3 (three) times daily with meals.   terazosin 10 MG capsule Commonly known as: HYTRIN 1 capsule (10 mg total) by Per J Tube route daily.            Home Infusion Instuctions  (From admission, onward)         Start     Ordered   07/16/19 0000  Home infusion instructions    Question:  Instructions  Answer:  Flushing of vascular access device: 0.9% NaCl pre/post medication administration  and prn patency; Heparin 100 u/ml, 65m for implanted ports and Heparin 10u/ml, 529mfor all other central venous catheters.   07/16/19 1012         Allergies  Allergen Reactions  . Eggs Or Egg-Derived Products Nausea And Vomiting    Contact information for follow-up providers    Vyas, Dhruv B, MD. Schedule an appointment as soon as possible for a visit in 2 week(s).   Specialty: Internal Medicine Why: after discharge from SNF Contact information: [id number] [phone]          Contact information for after-discharge care    [facility]   Service: Skilled Nursing Contact information: [phone]                 The results of significant diagnostics from this hospitalization (including imaging, microbiology, ancillary and laboratory) are listed below for reference.    Significant  Diagnostic Studies: DG Abd 1 View  Result Date: 07/13/2019 CLINICAL DATA:  Abdominal pain, pain around PEG tube EXAM: ABDOMEN - 1 VIEW COMPARISON:  CT abdomen pelvis, 07/09/2019 FINDINGS: The bowel gas pattern is normal. No large burden of stool. Left hemiabdomen percutaneous jejunostomy tube. No radio-opaque calculi or other significant radiographic abnormality are seen. IMPRESSION: Nonobstructive pattern of bowel gas. No large burden of stool. No free air on supine radiographs. Left hemiabdomen percutaneous jejunostomy. Electronically Signed   By: AlEddie Candle.D.   On: 07/13/2019 15:20   CT Chest W Contrast  Result Date: 07/09/2019 CLINICAL DATA:  Fever and history of esophageal cancer.  Sepsis. EXAM: CT CHEST, ABDOMEN, AND PELVIS WITH CONTRAST TECHNIQUE: Multidetector CT imaging of the chest, abdomen and pelvis was performed following the standard protocol during bolus administration of intravenous contrast. CONTRAST:  10060mMNIPAQUE IOHEXOL 300 MG/ML  SOLN COMPARISON:  05/09/2019 FINDINGS: CT CHEST FINDINGS Cardiovascular: Normal heart size. No pericardial effusion. Extensive coronary atherosclerotic calcification. Porta catheter with tip at the distal SVC. Mediastinum/Nodes: Lower esophageal mass with wall thickening and retain fluid superiorly. There is indistinct fat at the GE junction with periesophageal nodularity likely reflecting tumor. No noted perforation. Small bilateral thyroid nodules measuring 1 cm or less. No followup recommended (ref: J Am Coll Radiol. 2015 Feb;12(2): 143-50). Lungs/Pleura: Small left pleural effusion without nodularity. Left lower lobe atelectasis. Musculoskeletal: Spondylosis. Subcutaneous lipoma partially covered posterior to the right shoulder. CT ABDOMEN PELVIS FINDINGS Hepatobiliary: Vague 4 mm low-density in the subcapsular high right lobe liver.Sludge or noncalcified gallstones. The gallbladder is full but there is no acute cholecystitis. Pancreas: Unremarkable.  Spleen: Unremarkable. Adrenals/Urinary Tract: Small left adrenal myelolipoma. No hydronephrosis or stone. Unremarkable bladder. Stomach/Bowel: Fluid levels reach the distal colon. No bowel wall thickening. Percutaneous jejunostomy tube in good position. Negative for bowel obstruction. Vascular/Lymphatic: No acute vascular abnormality. Diffuse atherosclerotic calcification. Mildly enlarged lymph nodes about the GE junction. Reproductive:No acute finding Other: No ascites or pneumoperitoneum. Musculoskeletal: Extensive spondylosis. No bone metastases by recent PET-CT. IMPRESSION: 1. No specific cause of sepsis. 2. There is liquid stool but no colitic wall thickening. 3. Small left pleural effusion with atelectasis. 4. Known obstructive lower esophageal mass. Subcentimeter low-density in the upper right liver, attention on follow-up. 5. Cholelithiasis. Electronically Signed   By: JonMonte FantasiaD.   On: 07/09/2019 10:27   CT ABDOMEN PELVIS W CONTRAST  Result Date: 07/09/2019 CLINICAL  DATA:  Fever and history of esophageal cancer.  Sepsis. EXAM: CT CHEST, ABDOMEN, AND PELVIS WITH CONTRAST TECHNIQUE: Multidetector CT imaging of the chest, abdomen and pelvis was performed following the standard protocol during bolus administration of intravenous contrast. CONTRAST:  114m OMNIPAQUE IOHEXOL 300 MG/ML  SOLN COMPARISON:  05/09/2019 FINDINGS: CT CHEST FINDINGS Cardiovascular: Normal heart size. No pericardial effusion. Extensive coronary atherosclerotic calcification. Porta catheter with tip at the distal SVC. Mediastinum/Nodes: Lower esophageal mass with wall thickening and retain fluid superiorly. There is indistinct fat at the GE junction with periesophageal nodularity likely reflecting tumor. No noted perforation. Small bilateral thyroid nodules measuring 1 cm or less. No followup recommended (ref: J Am Coll Radiol. 2015 Feb;12(2): 143-50). Lungs/Pleura: Small left pleural effusion without nodularity. Left lower  lobe atelectasis. Musculoskeletal: Spondylosis. Subcutaneous lipoma partially covered posterior to the right shoulder. CT ABDOMEN PELVIS FINDINGS Hepatobiliary: Vague 4 mm low-density in the subcapsular high right lobe liver.Sludge or noncalcified gallstones. The gallbladder is full but there is no acute cholecystitis. Pancreas: Unremarkable. Spleen: Unremarkable. Adrenals/Urinary Tract: Small left adrenal myelolipoma. No hydronephrosis or stone. Unremarkable bladder. Stomach/Bowel: Fluid levels reach the distal colon. No bowel wall thickening. Percutaneous jejunostomy tube in good position. Negative for bowel obstruction. Vascular/Lymphatic: No acute vascular abnormality. Diffuse atherosclerotic calcification. Mildly enlarged lymph nodes about the GE junction. Reproductive:No acute finding Other: No ascites or pneumoperitoneum. Musculoskeletal: Extensive spondylosis. No bone metastases by recent PET-CT. IMPRESSION: 1. No specific cause of sepsis. 2. There is liquid stool but no colitic wall thickening. 3. Small left pleural effusion with atelectasis. 4. Known obstructive lower esophageal mass. Subcentimeter low-density in the upper right liver, attention on follow-up. 5. Cholelithiasis. Electronically Signed   By: JMonte FantasiaM.D.   On: 07/09/2019 10:27   DG Chest Port 1 View  Result Date: 07/13/2019 CLINICAL DATA:  Recent Port-A-Cath removal. EXAM: PORTABLE CHEST 1 VIEW COMPARISON:  July 09, 2019 FINDINGS: The right-sided venous Port-A-Cath seen on the prior study has been removed. Mild atelectasis and/or infiltrate is seen within the retrocardiac region of the left lung base. There is no evidence of a pleural effusion or pneumothorax. The heart size and mediastinal contours are within normal limits. A radiopaque fusion plate and screws are seen overlying the cervical spine. Radiopaque surgical pins are seen overlying the right humeral head. Multilevel degenerative changes seen throughout the thoracic  spine. IMPRESSION: 1. Mild left basilar atelectasis and/or infiltrate. 2. Postoperative changes within the cervical spine. Electronically Signed   By: TVirgina NorfolkM.D.   On: 07/13/2019 19:28   DG Chest Port 1 View  Result Date: 07/09/2019 CLINICAL DATA:  Fever.  History of esophageal carcinoma EXAM: PORTABLE CHEST 1 VIEW COMPARISON:  July 08, 2019 FINDINGS: Port-A-Cath tip is in the superior vena cava. No pneumothorax. There is a small left pleural effusion. Lungs elsewhere are clear. Heart size and pulmonary vascularity are normal. No appreciable mediastinal widening. There is aortic atherosclerosis. No evident adenopathy. Postoperative change noted in lower cervical spine. Postoperative change also noted in right shoulder. IMPRESSION: Small left pleural effusion. Lungs elsewhere clear. Stable cardiac silhouette. Aortic Atherosclerosis (ICD10-I70.0). Port-A-Cath tip in superior vena cava.  No pneumothorax. Electronically Signed   By: WLowella GripIII M.D.   On: 07/09/2019 09:05   DG Chest Port 1 View  Result Date: 07/08/2019 CLINICAL DATA:  Cough and shortness of breath EXAM: PORTABLE CHEST 1 VIEW COMPARISON:  None. FINDINGS: The heart size and mediastinal contours are within normal limits. Both lungs are clear.  The visualized skeletal structures are unremarkable. There is a right chest wall power-injectable Port-A-Cath with tip in the lower SVC via a right internal jugular vein approach. IMPRESSION: No active disease. Electronically Signed   By: Ulyses Jarred M.D.   On: 07/08/2019 03:46   ECHOCARDIOGRAM LIMITED  Result Date: 07/12/2019    ECHOCARDIOGRAM LIMITED REPORT   Patient Name:   LAVERT MATOUSEK Date of Exam: 07/12/2019 Medical Rec #:  [id number]       Height:       73.0 in Accession #:    [id number]      Weight:       206.1 lb Date of Birth:  [birth year]       BSA:          2.179 m Patient Age:    71 years        BP:           129/89 mmHg Patient Gender: M               HR:           84 bpm.  Exam Location:  [facility] Procedure: Limited Echo Indications:    Bacteremia 790.7 / R78.81  History:        Patient has prior history of Echocardiogram examinations, most                 recent 05/10/2019. COPD; Risk Factors:Diabetes and Former Smoker.                 GERD, Sepsis, Septic shock , Neutropenic fever.  Sonographer:    Leavy Cella RDCS (AE) Referring Phys: [place]  1. Left ventricular ejection fraction, by estimation, is 55 to 60%. The left ventricle has normal function. The left ventricle has no regional wall motion abnormalities.  2. Right ventricular systolic function is normal. The right ventricular size is normal. There is normal pulmonary artery systolic pressure.  3. Trivial mitral valve regurgitation. No evidence of mitral stenosis.  4. Aortic valve poorly visualized. In the short access heavy shadowing from anular calcification, leaflets poorly visualized. In some views there is at least some suggestion of a density moving independently from the leaflets, possibly complex calcification but cannot exlcude a vegetation. If clinical suspicion for endocarditis consider TEE. There is no significant aortic valve dysfunction. . The aortic valve is abnormal. Aortic valve regurgitation is not visualized. No aortic stenosis is present.  5. Indeterminant PASP, inadequate TR jet.  6. The inferior vena cava is normal in size with greater than 50% respiratory variability, suggesting right atrial pressure of 3 mmHg. FINDINGS  Left Ventricle: Left ventricular ejection fraction, by estimation, is 55 to 60%. The left ventricle has normal function. The left ventricle has no regional wall motion abnormalities. Right Ventricle: The right ventricular size is normal. No increase in right ventricular wall thickness. Right ventricular systolic function is normal. There is normal pulmonary artery systolic pressure. The tricuspid regurgitant velocity is 1.91 m/s, and  with an assumed right  atrial pressure of 10 mmHg, the estimated right ventricular systolic pressure is 96.2 mmHg. Mitral Valve: There is mild thickening of the mitral valve leaflet(s). There is mild calcification of the mitral valve leaflet(s). Mild mitral annular calcification. Trivial mitral valve regurgitation. No evidence of mitral valve stenosis. Tricuspid Valve: Tricuspid valve regurgitation is trivial. No evidence of tricuspid stenosis. Aortic Valve: Aortic valve poorly visualized. In the short access heavy shadowing from anular calcification, leaflets poorly visualized.  In some views there is at least some suggestion of a density moving independently from the leaflets, possibly complex  calcification but cannot exlcude a vegetation. If clinical suspicion for endocarditis consider TEE. There is no significant aortic valve dysfunction. The aortic valve is abnormal. . There is mild thickening and mild calcification of the aortic valve. Aortic valve regurgitation is not visualized. No aortic stenosis is present. Mild aortic valve annular calcification. There is mild thickening of the aortic valve. There is mild calcification of the aortic valve. Pulmonic Valve: The pulmonic valve was normal in structure. Pulmonic valve regurgitation is not visualized. No evidence of pulmonic stenosis. Pulmonary Artery: Indeterminant PASP, inadequate TR jet. Venous: The inferior vena cava is normal in size with greater than 50% respiratory variability, suggesting right atrial pressure of 3 mmHg.  LEFT VENTRICLE PLAX 2D LVIDd:         4.45 cm LVIDs:         3.17 cm LV PW:         1.14 cm LV IVS:        0.86 cm LVOT diam:     2.20 cm LV SV:         75 LV SV Index:   34 LVOT Area:     3.80 cm  LEFT ATRIUM         Index LA diam:    3.40 cm 1.56 cm/m  AORTIC VALVE LVOT Vmax:   106.00 cm/s LVOT Vmean:  54.500 cm/s LVOT VTI:    0.197 m  AORTA Ao Root diam: 3.20 cm MITRAL VALVE                TRICUSPID VALVE MV Area (PHT): 3.40 cm     TR Peak grad:   14.6  mmHg MV Decel Time: 223 msec     TR Vmax:        191.00 cm/s MV E velocity: 105.00 cm/s MV A velocity: 72.40 cm/s   SHUNTS MV E/A ratio:  1.45         Systemic VTI:  0.20 m                             Systemic Diam: 2.20 cm Carlyle Dolly MD Electronically signed by Carlyle Dolly MD Signature Date/Time: 07/12/2019/11:22:45 AM    Final    [place] EKG SITE RITE  Result Date: 07/14/2019 If Site Rite image not attached, placement could not be confirmed due to current cardiac rhythm.   Microbiology: Recent Results (from the past 240 hour(s))  Blood Culture (routine x 2)     Status: Abnormal   Collection Time: 07/09/19  8:44 AM   Specimen: BLOOD RIGHT FOREARM  Result Value Ref Range Status   Specimen Description   Final    BLOOD RIGHT FOREARM BOTTLES DRAWN AEROBIC AND ANAEROBIC Performed at [facility], [address], [place], [place] [address]    Special Requests   Final    Blood Culture adequate volume Performed at [facility], [address] [place], [place] [id number]    Culture  Setup Time   Final    GRAM POSITIVE COCCI IN CHAINS ANAEROBIC BOTTLE ONLY CRITICAL RESULT CALLED TO, READ BACK BY AND VERIFIED WITH: RN Mallie Snooks [id number] AT 726 AM BY CM Performed at [facility], [place] [address], [place], [place] [address]    Culture (A)  Final    STREPTOCOCCUS GROUP C CORRECTED ON 04/07 AT  0845: PREVIOUSLY REPORTED AS STREPTOCOCCUS CONSTELLATUS   Report Status 07/13/2019 FINAL  Final   Organism ID, Bacteria STREPTOCOCCUS GROUP C  Final      Susceptibility   Streptococcus group c - MIC*    PENICILLIN INTERMEDIATE Intermediate     CEFTRIAXONE 1 SENSITIVE Sensitive     ERYTHROMYCIN <=0.12 SENSITIVE Sensitive     LEVOFLOXACIN <=0.25 SENSITIVE Sensitive     VANCOMYCIN 0.5 SENSITIVE Sensitive     * STREPTOCOCCUS GROUP C CORRECTED ON 04/07 AT 0845: PREVIOUSLY REPORTED AS STREPTOCOCCUS CONSTELLATUS  Blood Culture (routine x 2)     Status: Abnormal (Preliminary result)   Collection Time:  07/09/19  8:44 AM   Specimen: Right Antecubital; Blood  Result Value Ref Range Status   Specimen Description   Final    RIGHT ANTECUBITAL BOTTLES DRAWN AEROBIC AND ANAEROBIC Performed at [facility], [address], [place], [place] [address]    Special Requests   Final    Blood Culture adequate volume Performed at [facility], [address], [place], [place] [address]    Culture  Setup Time   Final    GRAM POSITIVE COCCI Gram Stain Report Called to,Read Back By and Verified With: BONDURANT,R @ 7846 ON 07/11/19 BY JUW AEROBIC BOTTLE ONLY GS DONE @ [facility] GRAM POSITIVE COCCI GRAM POSITIVE RODS Gram Stain Report Called to,Read Back By and Verified With:  MARY HOWARTON,RN '@[id number]'  07/11/2019 KAY ANAEROBIC BOTTLE ONLY Performed at [facility], [address] [place], [place] [address]    Culture (A)  Final    STREPTOCOCCUS GROUP C SUSCEPTIBILITIES PERFORMED ON PREVIOUS CULTURE WITHIN THE LAST 5 DAYS. GRAM POSITIVE RODS    Report Status PENDING  Incomplete  Blood Culture ID Panel (Reflexed)     Status: Abnormal   Collection Time: 07/09/19  8:44 AM  Result Value Ref Range Status   Enterococcus species NOT DETECTED NOT DETECTED Final   Listeria monocytogenes NOT DETECTED NOT DETECTED Final   Staphylococcus species NOT DETECTED NOT DETECTED Final   Staphylococcus aureus (BCID) NOT DETECTED NOT DETECTED Final   Streptococcus species DETECTED (A) NOT DETECTED Final    Comment: Not Enterococcus species, Streptococcus agalactiae, Streptococcus pyogenes, or Streptococcus pneumoniae. CRITICAL RESULT CALLED TO, READ BACK BY AND VERIFIED WITH: RN R BONDURANT [id number] AT 16 AM BY CM    Streptococcus agalactiae NOT DETECTED NOT DETECTED Final   Streptococcus pneumoniae NOT DETECTED NOT DETECTED Final   Streptococcus pyogenes NOT DETECTED NOT DETECTED Final   Acinetobacter baumannii NOT DETECTED NOT DETECTED Final   Enterobacteriaceae species NOT DETECTED NOT DETECTED Final   Enterobacter cloacae  complex NOT DETECTED NOT DETECTED Final   Escherichia coli NOT DETECTED NOT DETECTED Final   Klebsiella oxytoca NOT DETECTED NOT DETECTED Final   Klebsiella pneumoniae NOT DETECTED NOT DETECTED Final   Proteus species NOT DETECTED NOT DETECTED Final   Serratia marcescens NOT DETECTED NOT DETECTED Final   Haemophilus influenzae NOT DETECTED NOT DETECTED Final   Neisseria meningitidis NOT DETECTED NOT DETECTED Final   Pseudomonas aeruginosa NOT DETECTED NOT DETECTED Final   Candida albicans NOT DETECTED NOT DETECTED Final   Candida glabrata NOT DETECTED NOT DETECTED Final   Candida krusei NOT DETECTED NOT DETECTED Final   Candida parapsilosis NOT DETECTED NOT DETECTED Final   Candida tropicalis NOT DETECTED NOT DETECTED Final    Comment: Performed at [facility], [place] [address] [place], [place] [address]  Urine culture     Status: None   Collection  Time: 07/09/19  8:50 AM   Specimen: Urine, Catheterized  Result Value Ref Range Status   Specimen Description   Final    URINE, CATHETERIZED Performed at [facility], [address], [place], [place] [address]    Special Requests   Final    NONE Performed at [facility], [address] [place], [place] [address]    Culture   Final    NO GROWTH Performed at [facility], [place] [address], [place], [place] [address]    Report Status 07/10/2019 FINAL  Final  SARS CORONAVIRUS 2 (TAT 6-24 HRS) Nasopharyngeal Nasopharyngeal Swab     Status: None   Collection Time: 07/09/19  9:08 AM   Specimen: Nasopharyngeal Swab  Result Value Ref Range Status   SARS Coronavirus 2 NEGATIVE NEGATIVE Final    Comment: (NOTE) SARS-CoV-2 target nucleic acids are NOT DETECTED. The SARS-CoV-2 RNA is generally detectable in upper and lower respiratory specimens during the acute phase of infection. Negative results do not preclude SARS-CoV-2 infection, do not rule out co-infections with other pathogens, and should not be used as the sole basis for  treatment or other patient management decisions. Negative results must be combined with clinical observations, patient history, and epidemiological information. The expected result is Negative. Fact Sheet for Patients: SugarRoll.be Fact Sheet for Healthcare Providers: https://www.woods-mathews.com/ This test is not yet approved or cleared by the [place] FDA and  has been authorized for detection and/or diagnosis of SARS-CoV-2 by FDA under an Emergency Use Authorization (EUA). This EUA will remain  in effect (meaning this test can be used) for the duration of the COVID-19 declaration under Section 56 4(b)(1) of the Act, 21 U.S.C. section 360bbb-3(b)(1), unless the authorization is terminated or revoked sooner. Performed at [facility], [place] [address], [place], [place] [address]   Respiratory Panel by RT PCR (Flu A&B, Covid) - Nasopharyngeal Swab     Status: None   Collection Time: 07/09/19 11:45 AM   Specimen: Nasopharyngeal Swab  Result Value Ref Range Status   SARS Coronavirus 2 by RT PCR NEGATIVE NEGATIVE Final    Comment: (NOTE) SARS-CoV-2 target nucleic acids are NOT DETECTED. The SARS-CoV-2 RNA is generally detectable in upper respiratoy specimens during the acute phase of infection. The lowest concentration of SARS-CoV-2 viral copies this assay can detect is 131 copies/mL. A negative result does not preclude SARS-Cov-2 infection and should not be used as the sole basis for treatment or other patient management decisions. A negative result may occur with  improper specimen collection/handling, submission of specimen other than nasopharyngeal swab, presence of viral mutation(s) within the areas targeted by this assay, and inadequate number of viral copies (<131 copies/mL). A negative result must be combined with clinical observations, patient history, and epidemiological information. The expected result is Negative. Fact  Sheet for Patients:  PinkCheek.be Fact Sheet for Healthcare Providers:  GravelBags.it This test is not yet ap proved or cleared by the [place] FDA and  has been authorized for detection and/or diagnosis of SARS-CoV-2 by FDA under an Emergency Use Authorization (EUA). This EUA will remain  in effect (meaning this test can be used) for the duration of the COVID-19 declaration under Section 564(b)(1) of the Act, 21 U.S.C. section 360bbb-3(b)(1), unless the authorization is terminated or revoked sooner.    Influenza A by PCR NEGATIVE NEGATIVE Final   Influenza B by PCR NEGATIVE NEGATIVE Final    Comment: (NOTE) The Xpert Xpress SARS-CoV-2/FLU/RSV assay is intended as  an aid in  the diagnosis of influenza from Nasopharyngeal swab specimens and  should not be used as a sole basis for treatment. Nasal washings and  aspirates are unacceptable for Xpert Xpress SARS-CoV-2/FLU/RSV  testing. Fact Sheet for Patients: PinkCheek.be Fact Sheet for Healthcare Providers: GravelBags.it This test is not yet approved or cleared by the [place] FDA and  has been authorized for detection and/or diagnosis of SARS-CoV-2 by  FDA under an Emergency Use Authorization (EUA). This EUA will remain  in effect (meaning this test can be used) for the duration of the  Covid-19 declaration under Section 564(b)(1) of the Act, 21  U.S.C. section 360bbb-3(b)(1), unless the authorization is  terminated or revoked. Performed at [facility], [address], [place], [place] [address]   MRSA PCR Screening     Status: None   Collection Time: 07/09/19  3:09 PM   Specimen: Nasal Mucosa; Nasopharyngeal  Result Value Ref Range Status   MRSA by PCR NEGATIVE NEGATIVE Final    Comment:        The GeneXpert MRSA Assay (FDA approved for NASAL specimens only), is one component of a comprehensive MRSA  colonization surveillance program. It is not intended to diagnose MRSA infection nor to guide or monitor treatment for MRSA infections. Performed at [facility], [address], [place], [place] [address]   Culture, blood (routine x 2)     Status: None (Preliminary result)   Collection Time: 07/12/19  9:47 AM   Specimen: BLOOD  Result Value Ref Range Status   Specimen Description BLOOD LEFT ANTECUBITAL  Final   Special Requests   Final    Blood Culture results may not be optimal due to an inadequate volume of blood received in culture bottles   Culture   Final    NO GROWTH 4 DAYS Performed at [facility], [address], [place], [place] [address]    Report Status PENDING  Incomplete  Culture, blood (routine x 2)     Status: None (Preliminary result)   Collection Time: 07/12/19  9:47 AM   Specimen: BLOOD LEFT HAND  Result Value Ref Range Status   Specimen Description BLOOD LEFT HAND  Final   Special Requests   Final    Blood Culture results may not be optimal due to an inadequate volume of blood received in culture bottles   Culture   Final    NO GROWTH 4 DAYS Performed at [facility], [address] [place], [place] [address]    Report Status PENDING  Incomplete     Labs: Basic Metabolic Panel: Recent Labs  Lab 07/10/19 0431 07/11/19 0411 07/12/19 0556 07/13/19 0405 07/14/19 0408 07/15/19 0600 07/16/19 0627  NA 130*   < > 131* 129* 129* 128* 132*  K 3.9   < > 3.8 3.3* 3.6 3.6 3.8  CL 96*   < > 96* 96* 94* 93* 92*  CO2 27   < > '26 25 25 28 29  ' GLUCOSE 263*   < > 157* 206* 194* 229* 165*  BUN 14   < > '10 8 8 9 10  ' CREATININE 0.49*   < > 0.47* 0.43* 0.50* 0.52* 0.54*  CALCIUM 7.7*   < > 7.7* 7.5* 7.6* 7.4* 8.0*  MG 1.7  --   --   --  1.8 1.7 1.8   < > = values in this interval not displayed.   Liver Function Tests: Recent Labs  Lab 07/10/19 0431  AST 15  ALT  18  ALKPHOS 90  BILITOT 0.3  PROT 4.9*  ALBUMIN 2.1*   CBC: Recent Labs  Lab 07/10/19 0431  07/10/19 1400 07/11/19 0411 07/12/19 0556 07/13/19 0405 07/14/19 0408 07/16/19 0627  WBC 2.1*   < > 2.0* 2.3* 2.2* 2.6* 3.3*  NEUTROABS 1.7  --   --   --   --   --   --   HGB 7.4*   < > 7.1* 8.6* 9.0* 10.0* 10.7*  HCT 22.1*   < > 21.5* 25.4* 26.8* 29.6* 31.6*  MCV 90.2   < > 90.3 89.4 88.2 88.1 89.8  PLT 113*   < > 118* 133* 141* 140* 132*   < > = values in this interval not displayed.    BNP (last 3 results) Recent Labs    07/08/19 0318  BNP 101.0*     CBG: Recent Labs  Lab 07/15/19 2140 07/16/19 0039 07/16/19 0405 07/16/19 0429 07/16/19 0718  GLUCAP 208* 215* 177* 176* 168*    Signed:  Barton Dubois MD.  [facility] 07/16/2019, 10:14 AM

## 2019-07-17 ENCOUNTER — Inpatient Hospital Stay (HOSPITAL_COMMUNITY)
Admission: AD | Admit: 2019-07-17 | Payer: Medicare Other | Source: Other Acute Inpatient Hospital | Admitting: Internal Medicine

## 2019-07-17 DIAGNOSIS — K219 Gastro-esophageal reflux disease without esophagitis: Secondary | ICD-10-CM | POA: Diagnosis not present

## 2019-07-17 DIAGNOSIS — M1990 Unspecified osteoarthritis, unspecified site: Secondary | ICD-10-CM | POA: Diagnosis not present

## 2019-07-17 DIAGNOSIS — E119 Type 2 diabetes mellitus without complications: Secondary | ICD-10-CM | POA: Diagnosis not present

## 2019-07-17 DIAGNOSIS — I10 Essential (primary) hypertension: Secondary | ICD-10-CM | POA: Diagnosis not present

## 2019-07-17 DIAGNOSIS — K9423 Gastrostomy malfunction: Secondary | ICD-10-CM | POA: Diagnosis not present

## 2019-07-17 DIAGNOSIS — Z87891 Personal history of nicotine dependence: Secondary | ICD-10-CM | POA: Diagnosis not present

## 2019-07-17 DIAGNOSIS — Z4682 Encounter for fitting and adjustment of non-vascular catheter: Secondary | ICD-10-CM | POA: Diagnosis not present

## 2019-07-17 LAB — CULTURE, BLOOD (ROUTINE X 2)
Culture: NO GROWTH
Culture: NO GROWTH

## 2019-07-18 DIAGNOSIS — E7800 Pure hypercholesterolemia, unspecified: Secondary | ICD-10-CM | POA: Diagnosis not present

## 2019-07-18 DIAGNOSIS — F319 Bipolar disorder, unspecified: Secondary | ICD-10-CM | POA: Diagnosis not present

## 2019-07-18 DIAGNOSIS — M109 Gout, unspecified: Secondary | ICD-10-CM | POA: Diagnosis not present

## 2019-07-18 DIAGNOSIS — F419 Anxiety disorder, unspecified: Secondary | ICD-10-CM | POA: Diagnosis not present

## 2019-07-18 LAB — CULTURE, BLOOD (ROUTINE X 2): Special Requests: ADEQUATE

## 2019-07-19 DIAGNOSIS — K219 Gastro-esophageal reflux disease without esophagitis: Secondary | ICD-10-CM | POA: Diagnosis not present

## 2019-07-19 DIAGNOSIS — C159 Malignant neoplasm of esophagus, unspecified: Secondary | ICD-10-CM | POA: Diagnosis not present

## 2019-07-19 DIAGNOSIS — E119 Type 2 diabetes mellitus without complications: Secondary | ICD-10-CM | POA: Diagnosis not present

## 2019-07-20 ENCOUNTER — Other Ambulatory Visit (HOSPITAL_COMMUNITY): Payer: Medicare Other

## 2019-07-20 ENCOUNTER — Ambulatory Visit (HOSPITAL_COMMUNITY): Payer: Medicare Other | Admitting: Hematology

## 2019-07-20 ENCOUNTER — Ambulatory Visit (HOSPITAL_COMMUNITY): Payer: Medicare Other

## 2019-07-22 ENCOUNTER — Encounter (HOSPITAL_COMMUNITY): Payer: Medicare Other

## 2019-07-26 DIAGNOSIS — C159 Malignant neoplasm of esophagus, unspecified: Secondary | ICD-10-CM | POA: Diagnosis not present

## 2019-07-26 DIAGNOSIS — F419 Anxiety disorder, unspecified: Secondary | ICD-10-CM | POA: Diagnosis not present

## 2019-07-26 DIAGNOSIS — E119 Type 2 diabetes mellitus without complications: Secondary | ICD-10-CM | POA: Diagnosis not present

## 2019-08-02 ENCOUNTER — Other Ambulatory Visit: Payer: Self-pay

## 2019-08-02 ENCOUNTER — Encounter (HOSPITAL_COMMUNITY): Payer: Self-pay | Admitting: Emergency Medicine

## 2019-08-02 ENCOUNTER — Inpatient Hospital Stay (HOSPITAL_COMMUNITY)
Admission: EM | Admit: 2019-08-02 | Discharge: 2019-08-06 | DRG: 177 | Disposition: A | Discharge status: Skilled Nursing Facility | Payer: Medicare Other | Source: Skilled Nursing Facility | Attending: Internal Medicine | Admitting: Internal Medicine

## 2019-08-02 ENCOUNTER — Emergency Department (HOSPITAL_COMMUNITY): Payer: Medicare Other

## 2019-08-02 DIAGNOSIS — L8990 Pressure ulcer of unspecified site, unspecified stage: Secondary | ICD-10-CM | POA: Insufficient documentation

## 2019-08-02 DIAGNOSIS — R0902 Hypoxemia: Secondary | ICD-10-CM | POA: Diagnosis not present

## 2019-08-02 DIAGNOSIS — Z20822 Contact with and (suspected) exposure to covid-19: Secondary | ICD-10-CM | POA: Diagnosis present

## 2019-08-02 DIAGNOSIS — I471 Supraventricular tachycardia: Secondary | ICD-10-CM | POA: Diagnosis present

## 2019-08-02 DIAGNOSIS — K219 Gastro-esophageal reflux disease without esophagitis: Secondary | ICD-10-CM | POA: Diagnosis present

## 2019-08-02 DIAGNOSIS — J690 Pneumonitis due to inhalation of food and vomit: Secondary | ICD-10-CM | POA: Diagnosis not present

## 2019-08-02 DIAGNOSIS — E876 Hypokalemia: Secondary | ICD-10-CM | POA: Diagnosis present

## 2019-08-02 DIAGNOSIS — E119 Type 2 diabetes mellitus without complications: Secondary | ICD-10-CM

## 2019-08-02 DIAGNOSIS — Z8249 Family history of ischemic heart disease and other diseases of the circulatory system: Secondary | ICD-10-CM

## 2019-08-02 DIAGNOSIS — C159 Malignant neoplasm of esophagus, unspecified: Secondary | ICD-10-CM | POA: Diagnosis present

## 2019-08-02 DIAGNOSIS — E113293 Type 2 diabetes mellitus with mild nonproliferative diabetic retinopathy without macular edema, bilateral: Secondary | ICD-10-CM | POA: Diagnosis present

## 2019-08-02 DIAGNOSIS — C155 Malignant neoplasm of lower third of esophagus: Secondary | ICD-10-CM | POA: Diagnosis present

## 2019-08-02 DIAGNOSIS — A419 Sepsis, unspecified organism: Secondary | ICD-10-CM | POA: Diagnosis present

## 2019-08-02 DIAGNOSIS — Z66 Do not resuscitate: Secondary | ICD-10-CM | POA: Diagnosis present

## 2019-08-02 DIAGNOSIS — Z931 Gastrostomy status: Secondary | ICD-10-CM

## 2019-08-02 DIAGNOSIS — R7881 Bacteremia: Secondary | ICD-10-CM | POA: Diagnosis present

## 2019-08-02 DIAGNOSIS — J449 Chronic obstructive pulmonary disease, unspecified: Secondary | ICD-10-CM | POA: Diagnosis present

## 2019-08-02 DIAGNOSIS — D696 Thrombocytopenia, unspecified: Secondary | ICD-10-CM

## 2019-08-02 DIAGNOSIS — K2100 Gastro-esophageal reflux disease with esophagitis, without bleeding: Secondary | ICD-10-CM | POA: Diagnosis present

## 2019-08-02 DIAGNOSIS — Z841 Family history of disorders of kidney and ureter: Secondary | ICD-10-CM

## 2019-08-02 DIAGNOSIS — E871 Hypo-osmolality and hyponatremia: Secondary | ICD-10-CM | POA: Diagnosis present

## 2019-08-02 DIAGNOSIS — Z87891 Personal history of nicotine dependence: Secondary | ICD-10-CM

## 2019-08-02 DIAGNOSIS — E860 Dehydration: Secondary | ICD-10-CM | POA: Diagnosis present

## 2019-08-02 DIAGNOSIS — M109 Gout, unspecified: Secondary | ICD-10-CM | POA: Diagnosis present

## 2019-08-02 DIAGNOSIS — Z95828 Presence of other vascular implants and grafts: Secondary | ICD-10-CM | POA: Diagnosis not present

## 2019-08-02 DIAGNOSIS — Z934 Other artificial openings of gastrointestinal tract status: Secondary | ICD-10-CM

## 2019-08-02 DIAGNOSIS — J9601 Acute respiratory failure with hypoxia: Secondary | ICD-10-CM | POA: Diagnosis not present

## 2019-08-02 DIAGNOSIS — I10 Essential (primary) hypertension: Secondary | ICD-10-CM | POA: Diagnosis present

## 2019-08-02 DIAGNOSIS — J698 Pneumonitis due to inhalation of other solids and liquids: Secondary | ICD-10-CM | POA: Diagnosis not present

## 2019-08-02 DIAGNOSIS — R0602 Shortness of breath: Secondary | ICD-10-CM | POA: Diagnosis not present

## 2019-08-02 DIAGNOSIS — K920 Hematemesis: Secondary | ICD-10-CM | POA: Diagnosis present

## 2019-08-02 LAB — CBC WITH DIFFERENTIAL/PLATELET
Abs Immature Granulocytes: 0.04 10*3/uL (ref 0.00–0.07)
Basophils Absolute: 0 10*3/uL (ref 0.0–0.1)
Basophils Relative: 1 %
Eosinophils Absolute: 0 10*3/uL (ref 0.0–0.5)
Eosinophils Relative: 0 %
HCT: 26.5 % — ABNORMAL LOW (ref 39.0–52.0)
Hemoglobin: 9 g/dL — ABNORMAL LOW (ref 13.0–17.0)
Immature Granulocytes: 1 %
Lymphocytes Relative: 12 %
Lymphs Abs: 0.6 10*3/uL — ABNORMAL LOW (ref 0.7–4.0)
MCH: 30 pg (ref 26.0–34.0)
MCHC: 34 g/dL (ref 30.0–36.0)
MCV: 88.3 fL (ref 80.0–100.0)
Monocytes Absolute: 0.3 10*3/uL (ref 0.1–1.0)
Monocytes Relative: 6 %
Neutro Abs: 4.3 10*3/uL (ref 1.7–7.7)
Neutrophils Relative %: 80 %
Platelets: 219 10*3/uL (ref 150–400)
RBC: 3 MIL/uL — ABNORMAL LOW (ref 4.22–5.81)
RDW: 14.5 % (ref 11.5–15.5)
WBC Morphology: INCREASED
WBC: 5.3 10*3/uL (ref 4.0–10.5)
nRBC: 0 % (ref 0.0–0.2)

## 2019-08-02 LAB — BASIC METABOLIC PANEL
Anion gap: 13 (ref 5–15)
BUN: 26 mg/dL — ABNORMAL HIGH (ref 8–23)
CO2: 23 mmol/L (ref 22–32)
Calcium: 7.9 mg/dL — ABNORMAL LOW (ref 8.9–10.3)
Chloride: 93 mmol/L — ABNORMAL LOW (ref 98–111)
Creatinine, Ser: 0.65 mg/dL (ref 0.61–1.24)
GFR calc Af Amer: >60 mL/min (ref >60)
GFR calc non Af Amer: >60 mL/min (ref >60)
Glucose, Bld: 303 mg/dL — ABNORMAL HIGH (ref 70–99)
Potassium: 4.2 mmol/L (ref 3.5–5.1)
Sodium: 129 mmol/L — ABNORMAL LOW (ref 135–145)

## 2019-08-02 MED ORDER — SODIUM CHLORIDE 0.9 % IV BOLUS
500.0000 mL | Freq: Once | INTRAVENOUS | Status: AC
  Administered 2019-08-02: 21:00:00 500 mL via INTRAVENOUS

## 2019-08-02 MED ORDER — SODIUM CHLORIDE 0.9 % IV SOLN: INTRAVENOUS | Status: DC

## 2019-08-02 NOTE — ED Triage Notes (Signed)
From [facility]. Recently dx with esophageal cancer. NPO with g tube, per nurse practitoner aspirated last night, have not been able to obtain chest xray.  Sats 89 % on 6liters.  Pt lethargic and sodium level 126

## 2019-08-03 ENCOUNTER — Other Ambulatory Visit: Payer: Self-pay

## 2019-08-03 ENCOUNTER — Emergency Department (HOSPITAL_COMMUNITY): Payer: Medicare Other

## 2019-08-03 DIAGNOSIS — Z934 Other artificial openings of gastrointestinal tract status: Secondary | ICD-10-CM | POA: Diagnosis not present

## 2019-08-03 DIAGNOSIS — Z20822 Contact with and (suspected) exposure to covid-19: Secondary | ICD-10-CM | POA: Diagnosis present

## 2019-08-03 DIAGNOSIS — I10 Essential (primary) hypertension: Secondary | ICD-10-CM | POA: Diagnosis present

## 2019-08-03 DIAGNOSIS — E113293 Type 2 diabetes mellitus with mild nonproliferative diabetic retinopathy without macular edema, bilateral: Secondary | ICD-10-CM | POA: Diagnosis present

## 2019-08-03 DIAGNOSIS — I471 Supraventricular tachycardia: Secondary | ICD-10-CM | POA: Diagnosis present

## 2019-08-03 DIAGNOSIS — C155 Malignant neoplasm of lower third of esophagus: Secondary | ICD-10-CM | POA: Diagnosis present

## 2019-08-03 DIAGNOSIS — J690 Pneumonitis due to inhalation of food and vomit: Secondary | ICD-10-CM

## 2019-08-03 DIAGNOSIS — R41841 Cognitive communication deficit: Secondary | ICD-10-CM | POA: Diagnosis not present

## 2019-08-03 DIAGNOSIS — E871 Hypo-osmolality and hyponatremia: Secondary | ICD-10-CM | POA: Diagnosis present

## 2019-08-03 DIAGNOSIS — E860 Dehydration: Secondary | ICD-10-CM | POA: Diagnosis present

## 2019-08-03 DIAGNOSIS — J9601 Acute respiratory failure with hypoxia: Secondary | ICD-10-CM | POA: Diagnosis present

## 2019-08-03 DIAGNOSIS — E119 Type 2 diabetes mellitus without complications: Secondary | ICD-10-CM | POA: Diagnosis not present

## 2019-08-03 DIAGNOSIS — A419 Sepsis, unspecified organism: Secondary | ICD-10-CM | POA: Diagnosis not present

## 2019-08-03 DIAGNOSIS — R2689 Other abnormalities of gait and mobility: Secondary | ICD-10-CM | POA: Diagnosis not present

## 2019-08-03 DIAGNOSIS — Z95828 Presence of other vascular implants and grafts: Secondary | ICD-10-CM | POA: Diagnosis not present

## 2019-08-03 DIAGNOSIS — K219 Gastro-esophageal reflux disease without esophagitis: Secondary | ICD-10-CM | POA: Diagnosis not present

## 2019-08-03 DIAGNOSIS — Z8249 Family history of ischemic heart disease and other diseases of the circulatory system: Secondary | ICD-10-CM | POA: Diagnosis not present

## 2019-08-03 DIAGNOSIS — M109 Gout, unspecified: Secondary | ICD-10-CM | POA: Diagnosis present

## 2019-08-03 DIAGNOSIS — K2100 Gastro-esophageal reflux disease with esophagitis, without bleeding: Secondary | ICD-10-CM | POA: Diagnosis present

## 2019-08-03 DIAGNOSIS — Z87891 Personal history of nicotine dependence: Secondary | ICD-10-CM | POA: Diagnosis not present

## 2019-08-03 DIAGNOSIS — Z931 Gastrostomy status: Secondary | ICD-10-CM | POA: Diagnosis not present

## 2019-08-03 DIAGNOSIS — J449 Chronic obstructive pulmonary disease, unspecified: Secondary | ICD-10-CM | POA: Diagnosis present

## 2019-08-03 DIAGNOSIS — C159 Malignant neoplasm of esophagus, unspecified: Secondary | ICD-10-CM | POA: Diagnosis not present

## 2019-08-03 DIAGNOSIS — Z792 Long term (current) use of antibiotics: Secondary | ICD-10-CM | POA: Diagnosis not present

## 2019-08-03 DIAGNOSIS — M6281 Muscle weakness (generalized): Secondary | ICD-10-CM | POA: Diagnosis not present

## 2019-08-03 DIAGNOSIS — R7881 Bacteremia: Secondary | ICD-10-CM | POA: Diagnosis present

## 2019-08-03 DIAGNOSIS — Z841 Family history of disorders of kidney and ureter: Secondary | ICD-10-CM | POA: Diagnosis not present

## 2019-08-03 DIAGNOSIS — E876 Hypokalemia: Secondary | ICD-10-CM | POA: Diagnosis present

## 2019-08-03 DIAGNOSIS — Z66 Do not resuscitate: Secondary | ICD-10-CM | POA: Diagnosis present

## 2019-08-03 DIAGNOSIS — K920 Hematemesis: Secondary | ICD-10-CM | POA: Diagnosis present

## 2019-08-03 LAB — BASIC METABOLIC PANEL
Anion gap: 14 (ref 5–15)
BUN: 22 mg/dL (ref 8–23)
CO2: 24 mmol/L (ref 22–32)
Calcium: 8.2 mg/dL — ABNORMAL LOW (ref 8.9–10.3)
Chloride: 93 mmol/L — ABNORMAL LOW (ref 98–111)
Creatinine, Ser: 0.42 mg/dL — ABNORMAL LOW (ref 0.61–1.24)
GFR calc Af Amer: >60 mL/min (ref >60)
GFR calc non Af Amer: >60 mL/min (ref >60)
Glucose, Bld: 215 mg/dL — ABNORMAL HIGH (ref 70–99)
Potassium: 3.9 mmol/L (ref 3.5–5.1)
Sodium: 131 mmol/L — ABNORMAL LOW (ref 135–145)

## 2019-08-03 LAB — MRSA PCR SCREENING: MRSA by PCR: NEGATIVE

## 2019-08-03 LAB — CBC
HCT: 26.1 % — ABNORMAL LOW (ref 39.0–52.0)
Hemoglobin: 8.6 g/dL — ABNORMAL LOW (ref 13.0–17.0)
MCH: 29.2 pg (ref 26.0–34.0)
MCHC: 33 g/dL (ref 30.0–36.0)
MCV: 88.5 fL (ref 80.0–100.0)
Platelets: 202 10*3/uL (ref 150–400)
RBC: 2.95 MIL/uL — ABNORMAL LOW (ref 4.22–5.81)
RDW: 14.6 % (ref 11.5–15.5)
WBC: 4 10*3/uL (ref 4.0–10.5)
nRBC: 0 % (ref 0.0–0.2)

## 2019-08-03 LAB — GLUCOSE, CAPILLARY
Glucose-Capillary: 207 mg/dL — ABNORMAL HIGH (ref 70–99)
Glucose-Capillary: 158 mg/dL — ABNORMAL HIGH (ref 70–99)
Glucose-Capillary: 157 mg/dL — ABNORMAL HIGH (ref 70–99)
Glucose-Capillary: 161 mg/dL — ABNORMAL HIGH (ref 70–99)

## 2019-08-03 MED ORDER — ONDANSETRON HCL 4 MG PO TABS: 4.0000 mg | ORAL_TABLET | Freq: Four times a day (QID) | ORAL | Status: DC | PRN

## 2019-08-03 MED ORDER — FLUOXETINE HCL 20 MG PO CAPS
20.0000 mg | ORAL_CAPSULE | Freq: Every day | ORAL | Status: DC
  Administered 2019-08-03 – 2019-08-06 (×4): 20 mg
  Filled 2019-08-03 (×4): qty 1

## 2019-08-03 MED ORDER — ONDANSETRON HCL 4 MG/2ML IJ SOLN: 4.0000 mg | Freq: Four times a day (QID) | INTRAMUSCULAR | Status: DC | PRN

## 2019-08-03 MED ORDER — ENOXAPARIN SODIUM 40 MG/0.4ML ~~LOC~~ SOLN
40.0000 mg | SUBCUTANEOUS | Status: DC
  Administered 2019-08-03 – 2019-08-06 (×4): 40 mg via SUBCUTANEOUS
  Filled 2019-08-03 (×4): qty 0.4

## 2019-08-03 MED ORDER — TERAZOSIN HCL 5 MG PO CAPS
10.0000 mg | ORAL_CAPSULE | Freq: Every day | ORAL | Status: DC
  Administered 2019-08-03 – 2019-08-06 (×4): 10 mg via JEJUNOSTOMY
  Filled 2019-08-03 (×4): qty 2

## 2019-08-03 MED ORDER — SODIUM CHLORIDE 0.9 % IV SOLN
2.0000 g | Freq: Three times a day (TID) | INTRAVENOUS | Status: DC
  Administered 2019-08-03 – 2019-08-06 (×11): 2 g via INTRAVENOUS
  Filled 2019-08-03 (×11): qty 2

## 2019-08-03 MED ORDER — VANCOMYCIN HCL 1250 MG/250ML IV SOLN
1250.0000 mg | Freq: Two times a day (BID) | INTRAVENOUS | Status: DC
  Administered 2019-08-03 – 2019-08-04 (×2): 1250 mg via INTRAVENOUS
  Filled 2019-08-03 (×3): qty 250

## 2019-08-03 MED ORDER — POLYETHYLENE GLYCOL 3350 17 G PO PACK: 17.0000 g | PACK | Freq: Every day | ORAL | Status: DC | PRN

## 2019-08-03 MED ORDER — BISACODYL 10 MG RE SUPP: 10.0000 mg | Freq: Every day | RECTAL | Status: DC | PRN

## 2019-08-03 MED ORDER — SODIUM CHLORIDE 0.9% FLUSH
3.0000 mL | Freq: Two times a day (BID) | INTRAVENOUS | Status: DC
  Administered 2019-08-04 – 2019-08-06 (×3): 3 mL via INTRAVENOUS

## 2019-08-03 MED ORDER — SODIUM CHLORIDE 0.9% FLUSH: 3.0000 mL | INTRAVENOUS | Status: DC | PRN

## 2019-08-03 MED ORDER — OSMOLITE 1.5 CAL PO LIQD
1000.0000 mL | ORAL | Status: DC
  Administered 2019-08-04 – 2019-08-05 (×2): 1000 mL

## 2019-08-03 MED ORDER — SODIUM CHLORIDE 0.9 % IV SOLN
250.0000 mL | INTRAVENOUS | Status: DC | PRN
  Administered 2019-08-05: 18:00:00 250 mL via INTRAVENOUS

## 2019-08-03 MED ORDER — IOHEXOL 350 MG/ML SOLN
100.0000 mL | Freq: Once | INTRAVENOUS | Status: AC | PRN
  Administered 2019-08-03: 02:00:00 100 mL via INTRAVENOUS

## 2019-08-03 MED ORDER — PANTOPRAZOLE SODIUM 40 MG PO PACK
40.0000 mg | PACK | Freq: Two times a day (BID) | ORAL | Status: DC
  Administered 2019-08-04 – 2019-08-06 (×6): 40 mg
  Filled 2019-08-03 (×6): qty 20

## 2019-08-03 MED ORDER — LACTATED RINGERS IV SOLN: INTRAVENOUS | Status: DC

## 2019-08-03 MED ORDER — INSULIN ASPART 100 UNIT/ML ~~LOC~~ SOLN
0.0000 [IU] | Freq: Three times a day (TID) | SUBCUTANEOUS | Status: DC
  Administered 2019-08-03: 12:00:00 2 [IU] via SUBCUTANEOUS
  Administered 2019-08-03: 09:00:00 3 [IU] via SUBCUTANEOUS
  Administered 2019-08-03 – 2019-08-04 (×2): 2 [IU] via SUBCUTANEOUS
  Administered 2019-08-04: 11:00:00 3 [IU] via SUBCUTANEOUS
  Administered 2019-08-04: 13:00:00 2 [IU] via SUBCUTANEOUS
  Administered 2019-08-05 (×2): 3 [IU] via SUBCUTANEOUS

## 2019-08-03 MED ORDER — FREE WATER
250.0000 mL | Freq: Three times a day (TID) | Status: DC
  Administered 2019-08-04 – 2019-08-06 (×9): 250 mL

## 2019-08-03 MED ORDER — FAMOTIDINE 20 MG PO TABS
20.0000 mg | ORAL_TABLET | Freq: Every day | ORAL | Status: DC
  Administered 2019-08-04 – 2019-08-05 (×3): 20 mg
  Filled 2019-08-03 (×3): qty 1

## 2019-08-03 MED ORDER — ALBUTEROL SULFATE (2.5 MG/3ML) 0.083% IN NEBU: 3.0000 mL | INHALATION_SOLUTION | RESPIRATORY_TRACT | Status: DC | PRN

## 2019-08-03 MED ORDER — SODIUM CHLORIDE 1 G PO TABS
2.0000 g | ORAL_TABLET | Freq: Three times a day (TID) | ORAL | Status: DC
  Administered 2019-08-03 – 2019-08-06 (×11): 2 g via JEJUNOSTOMY
  Filled 2019-08-03 (×11): qty 2

## 2019-08-03 MED ORDER — CHLORHEXIDINE GLUCONATE CLOTH 2 % EX PADS
6.0000 | MEDICATED_PAD | Freq: Every day | CUTANEOUS | Status: DC
  Administered 2019-08-03 – 2019-08-06 (×4): 6 via TOPICAL

## 2019-08-03 MED ORDER — ALPRAZOLAM 0.5 MG PO TABS
0.5000 mg | ORAL_TABLET | Freq: Two times a day (BID) | ORAL | Status: DC
  Administered 2019-08-03 – 2019-08-06 (×7): 0.5 mg via JEJUNOSTOMY
  Filled 2019-08-03: qty 2
  Filled 2019-08-03 (×6): qty 1

## 2019-08-03 NOTE — Progress Notes (Signed)
Initial Nutrition Assessment  DOCUMENTATION CODES:   Not applicable  INTERVENTION:  Recommend resuming home tube feeding regimen as able -Osmolite 1.5 (6 cartons daily)  89 ml/hr from 3PM - 7AM with 30 ml Prostat TID, and 1000 ml free water daily This provides 2313 kcal, 134 grams of protein, and 1086 water from formula (2100 ml total water daily with flushes)   NUTRITION DIAGNOSIS:   Increased nutrient needs related to cancer and cancer related treatments as evidenced by estimated needs.   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  3/31-last chemo 4/9 - PICC placed  Followed by outpatient RD at cancer center, last contact on 3/26. Patient is PEG dependent for 100% of nutrition/hydration needs. -Home regimen: Osmolite 1.5 (6 cartons daily)  89 ml/hr from 3PM - 7AM with 30 ml Prosource TID, and 1000 ml free water daily This provides 2313 kcal, 134 grams of protein, and 1086 water from formula (2100 ml total water daily with flushes)  Current wt 178.64 lbs Per history, pt weights have been stable 191-197 lbs over the past 2 months, on 07/08/19 pt weighed 196.68 lbs, on 07/06/19 pt weighed 192.06 lbs, on 06/29/19 pt weighed 194.48 lbs, on 06/22/19 pt weighed 192.06 lbs, on 06/07/19 pt weighed 190.52 lbs. This indicates ~13 lb (7%) wt loss in the past month which is significant.   Medications reviewed and include: SSI, sodium chloride tablet with meals, terazosin IVF: Lactated  ringers IVPB: Maxipime, Vancomycin Labs: CBG 207, Na 131 (L)  NUTRITION - FOCUSED PHYSICAL EXAM: Deferred   Diet Order:   Diet Order            Diet NPO time specified  Diet effective now              EDUCATION NEEDS:   No education needs have been identified at this time  Skin:  Skin Assessment: Reviewed RN Assessment  Last BM:  4/27  Height:   Ht Readings from Last 1 Encounters:  08/03/19 6\' 1"  (1.854 m)    Weight:   Wt Readings from Last 1 Encounters:  08/03/19 81.2 kg    BMI:  Body mass index is 23.62 kg/m.  Estimated Nutritional Needs:   Kcal:  2150-2580  Protein:  110-130  Fluid:  >/= 2 L/day   Lajuan Lines, RD, LDN Clinical Nutrition After Hours/Weekend Pager # in [place]

## 2019-08-03 NOTE — Progress Notes (Signed)
Patient admitted to the hospital earlier this morning by Dr. Scherrie November  Patient seen and examined.  Overall he feels that his breathing is improving.  He does have a cough.  Assessment/plan:  1. Acute respiratory failure with hypoxia secondary to aspiration pneumonia.  Currently on 4 to 5 L oxygen.  He is currently on cefepime.  He is not having fevers and his overall condition appears to be stabilizing.  Continue to wean down oxygen as tolerated.  Encourage pulmonary hygiene. 2. Esophageal cancer.  Continue follow-up with oncology for further chemotherapy/radiation.  Patient is PEG tube dependent for feeding/medications. 3. GERD.  Continue on PPI twice a day. 4. Type 2 diabetes.  On oral hypoglycemics.  Will hold for now and has been started on sliding scale insulin.  Kurt Duran

## 2019-08-03 NOTE — H&P (Addendum)
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  Presented with worsening hypoxemia and requiring 5 physical liters of oxygen in the ER.  CT angiogram of the chest shows patchy bibasilar opacities with tree-in-bud appearance concerning for aspiration pneumonia. -We will place on broad-spectrum IV antibiotics -Monitor CBC, temp  Other Active Problems: Acute hypoxemic respiratory failure Patient presented with oxygen saturation in the 80s and requiring 5 to 6 L of oxygen in the ER.  Not on home oxygen.  Likely secondary to aspiration pneumonia. -We will titrate oxygen as needed  Recent history of Streptococcus constellatus bacteremia Patient was admitted from 4/3-4/10 with septic shock secondary to bacteremia.  Was also noted to have a febrile neutropenia.  Patient had a PICC line placed and plan was for 6 weeks of IV ceftriaxone.  Esophageal cancer Last chemotherapy was 07/06/2019. -Continue outpatient follow-up by oncology service for further chemotherapy/radiation treatments. -Per records review plan is for outpatient follow-up with cardiothoracic surgery as an outpatient to consider resection  at some point. -Patient remains n.p.o. with the use of PEG-J-tube for feedings and to use as  route for medications.  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  Type 2 diabetes -Stable and well-controlled -Hold oral hypoglycemics during hospital stay -Sliding scale insulin coverage with fingerstick monitoring  Hyponatremia (chronic; but worse during this admission). 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 -Free water flushes dosage has been adjusted  DVT prophylaxis: Lovenox Code Status:  DNR  Family Communication: N/A  Disposition Plan: Admitted as inpatient for aspiration pneumonia, will place on IV antibiotics already has a PICC line. Consults called: N/A Admission status: Inpatient status    HPI:     HPI: Kurt Duran is a 74 y.o. male with medical history significant of esophageal cancer, diabetes mellitus type 2, chronic hyponatremia, GERD who presented from the nursing home with worsening shortness of breath and cough.  He was noted to have hypoxemia in the nursing home.  Patient was admitted to this facility from 07/09/2019-07/16/2019 with streptococcal bacteremia.  Patient has a known history of esophageal cancer and is on PEG tube feeding and is followed by heme-onc and receives chemo and radiation.  Last chemo was on 07/06/2019.  Gets feeds and medications through G-tube.  Had a PICC line placed on recent admission for IV antibiotics for 6 weeks.  Was sent in from the nursing home due to persistent hypoxemia with saturation in the 80s on room air.  Has not been on home oxygen.  Was requiring 5 to 6 L oxygen  in the ER.  Was noted to be slightly more lethargic. No significant chest pain, abdominal pain, nausea, vomiting, dysuria, diarrhea.  Has chronic lower extremity  weakness.  Receives physical therapy in the nursing home. ED Course:  Vital Signs reviewed on presentation, significant for temperature 98.4, respirate 32, heart rate 111, saturation 95% on 5 L nasal cannula. Labs reviewed, significant for sodium 129, potassium 4.2, BUN 26, creatinine 0.65, WBC count 5.3, hemoglobin 9.0, hematocrit 26, platelets 219, Imaging personally Reviewed, CT angiogram of the chest shows no evidence of pulmonary embolism, tree-in-bud and patchy airspace opacity seen predominantly within both lower lobes likely due to aspiration/infectious etiology.  Mildly dilated air and fluid-filled mid to distal esophagus with diffuse wall thickening of the GE junction. EKG personally reviewed, shows sinus tachycardia.    Review of systems:    Review of Systems: As per HPI otherwise 10 point review of systems negative.  All other review of systems is negative except the ones noted above in the HPI.    Past Medical and Surgical History:  Reviewed by me  Past Medical History:  Diagnosis Date  . Anxiety   . Arthritis   . COPD (chronic obstructive pulmonary disease) ([place])   . Diabetes mellitus without complication (Kurt Duran)   . Diabetic retinopathy (Kurt Duran)    NPDR OU  . Esophageal cancer (Kurt Duran)   . GERD (gastroesophageal reflux disease)   . Gout   . Hypertension   . Hypertensive retinopathy    OU  . Port-A-Cath in place 06/07/2019    Past Surgical History:  Procedure Laterality Date  . BIOPSY  05/10/2019   Procedure: BIOPSY;  Surgeon: Danie Binder, MD;  Location: [facility];  Service: Endoscopy;;  gastric esophageal  . CATARACT EXTRACTION W/PHACO Left 04/03/2016   Procedure: CATARACT EXTRACTION PHACO AND INTRAOCULAR LENS PLACEMENT LEFT EYE CDE= 11.33;  Surgeon: Tonny Branch, MD;  Location: [facility];  Service: Ophthalmology;  Laterality: Left;  left  . CATARACT EXTRACTION W/PHACO Right 04/28/2016   Procedure: CATARACT EXTRACTION PHACO AND INTRAOCULAR LENS PLACEMENT (IOC);   Surgeon: Tonny Branch, MD;  Location: [facility];  Service: Ophthalmology;  Laterality: Right;  cde-10.23   . ESOPHAGOGASTRODUODENOSCOPY (EGD) WITH PROPOFOL N/A 05/10/2019   Fields: medium sized ulcerating mass with bleeding found in distal esophagus, partially obstructing and circumferential (adenocarcinoma). Gastritis (benign bx) and duodenitis  . EYE SURGERY Bilateral    Cat Sx OU  . JEJUNOSTOMY N/A 05/13/2019   Procedure: JEJUNOSTOMY FEEDING TUBE PLACEMENT (Procedure #2);  Surgeon: Aviva Signs, MD;  Location: [facility];  Service: General;  Laterality: N/A;  . KNEE ARTHROSCOPY Right   . NECK SURGERY    . PORTACATH PLACEMENT Right 05/13/2019   Procedure: INSERTION PORT-A-CATH (attached catheter in right subclavian) (Procedure #1);  Surgeon: Aviva Signs, MD;  Location: [facility];  Service: General;  Laterality: Right;  . ROTATOR CUFF REPAIR Right      Social History:  Reviewed by me   reports that he quit smoking about 30 years ago. His smoking use included cigarettes. He has never used smokeless tobacco. He reports that he does not drink alcohol or use drugs.  Allergies:    Allergies  Allergen Reactions  . Eggs Or Egg-Derived Products Nausea And Vomiting    Family History :   Family History  Adopted: Yes  Problem Relation Age of Onset  . Hypertension Brother   . Kidney disease Brother    Family history reviewed, noted as above, not pertinent to current presentation.  Home Medications:    Prior to Admission medications   Medication Sig Start Date End Date Taking? Authorizing Provider  acetaminophen (TYLENOL) 325 MG tablet Place 2 tablets (650 mg total) into feeding tube every 6 (six) hours as needed for mild pain (or Fever >/= 101). 05/16/19  Yes Elgergawy, Silver Huguenin, MD  albuterol (PROVENTIL HFA;VENTOLIN HFA) 108 (90 Base) MCG/ACT inhaler Inhale 2 puffs into the lungs every 4 (four) hours as needed for wheezing or shortness of breath.   Yes [provider]  albuterol (PROVENTIL)  (2.5 MG/3ML) 0.083% nebulizer solution Take 2.5 mg by nebulization every 6 (six) hours as needed for wheezing or shortness of breath.   Yes [provider]  ALPRAZolam (XANAX) 0.5 MG tablet 1 tablet (0.5 mg total) by Per J Tube route 2 (two) times daily. 07/16/19  Yes Barton Dubois, MD  Amino Acids-Protein Hydrolys (FEEDING SUPPLEMENT, PRO-STAT SUGAR FREE 64,) LIQD 30 mLs by Per J Tube route 2 (two) times daily. 05/16/19  Yes Elgergawy, Silver Huguenin, MD  cefTRIAXone (ROCEPHIN) IVPB Inject 2 g into the vein daily. Indication:   bacteremia Last Day of Therapy:  08/22/19 Labs - Once weekly:  CBC/D and BMP, Labs - Every other week:  ESR and CRP Patient taking differently: Inject 2 g into the vein daily. Indication:   bacteremia Last Day of Therapy:  08/22/19 Labs - Once weekly:  CBC/D and BMP, Labs - Every other week:  ESR and CRP Total of 5 weeks and 2 day course 07/16/19  Yes Barton Dubois, MD  famotidine (PEPCID) 20 MG tablet Place 1 tablet (20 mg total) into feeding tube at bedtime. 07/17/19  Yes Barton Dubois, MD  FLUoxetine (PROZAC) 20 MG capsule Place 1 capsule (20 mg total) into feeding tube daily. 05/16/19  Yes Elgergawy, Silver Huguenin, MD  glipiZIDE (GLUCOTROL) 10 MG tablet Place 0.5-1 tablets (5-10 mg total) into feeding tube 2 (two) times daily before a meal. Take 10 mg in the morning and 5 mg in the evening 07/16/19  Yes Barton Dubois, MD  lidocaine-prilocaine (EMLA) cream Apply a pea-sized amount to port a cath site and cover with plastic wrap one hour prior to chemotherapy appointments Patient taking differently: Apply 1 application topically See admin instructions. Apply a pea-sized amount to port a cath site and cover with plastic wrap one hour prior to chemotherapy appointments 06/07/19  Yes Derek Jack, MD  metFORMIN (GLUCOPHAGE) 1000 MG tablet 1 tablet (1,000 mg total) by Per J Tube route 2 (two) times daily. 05/16/19  Yes Elgergawy, Silver Huguenin, MD  Nutritional Supplements (FEEDING  SUPPLEMENT, OSMOLITE 1.5 CAL,) LIQD Place 1,000 mLs into feeding tube continuous. 05/16/19  Yes Elgergawy, Silver Huguenin, MD  ondansetron (ZOFRAN-ODT) 4 MG disintegrating tablet 4 mg every 6 (six) hours as needed for nausea or vomiting (G-Tube).  06/03/19  Yes [provider]  PACLITAXEL IV Inject 50 mg/m2 into the vein once a week.  06/15/19  Yes [provider]  pantoprazole sodium (PROTONIX) 40 mg/20 mL PACK Place 20 mLs (40 mg total) into feeding tube 2 (two) times daily. 07/16/19  Yes Barton Dubois, MD  prochlorperazine (COMPAZINE) 10 MG tablet Take 1 tablet (10 mg total) by mouth every 6 (six) hours as needed (given per tube for Nausea or vomiting). 07/16/19  Yes Barton Dubois, MD  promethazine (PHENERGAN) 25 MG suppository Place 25 mg rectally every 6 (six) hours as needed. 06/03/19  Yes [provider]  sodium chloride 1 g tablet 2 tablets (2 g  total) by Per J Tube route 3 (three) times daily with meals. 07/16/19  Yes Barton Dubois, MD  terazosin (HYTRIN) 10 MG capsule 1 capsule (10 mg total) by Per J Tube route daily. 05/16/19  Yes Elgergawy, Silver Huguenin, MD  Water For Irrigation, Sterile (FREE WATER) SOLN Place 250 mLs into feeding tube every 8 (eight) hours. 07/16/19  Yes Barton Dubois, MD    Physical Exam:    Physical Exam: Vitals:   08/02/19 2300 08/03/19 0000 08/03/19 0130 08/03/19 0349  BP: (!) 105/58 106/62 113/71   Pulse: (!) 106 (!) 106 (!) 107   Resp: (!) 34 (!) 32 (!) 30   Temp:    98.4 F (36.9 C)  TempSrc:    Oral  SpO2: 95% 94% 93%   Weight:      Height:        Constitutional: NAD, calm, comfortable Vitals:   08/02/19 2300 08/03/19 0000 08/03/19 0130 08/03/19 0349  BP: (!) 105/58 106/62 113/71   Pulse: (!) 106 (!) 106 (!) 107   Resp: (!) 34 (!) 32 (!) 30   Temp:    98.4 F (36.9 C)  TempSrc:    Oral  SpO2: 95% 94% 93%   Weight:      Height:       Eyes: PERRL, lids and conjunctivae normal ENMT: Mucous membranes are dry. Posterior pharynx  clear of any exudate or lesions.Normal dentition.  Neck: normal, supple, no masses, no thyromegaly Respiratory: Decreased air entry at bases, bilateral crepitations, mild wheezing. Normal respiratory effort. No accessory muscle use.  Cardiovascular: Tachycardia, no murmurs / rubs / gallops. No extremity edema. 2+ pedal pulses. No carotid bruits.  Abdomen: no tenderness, no masses palpated. No hepatosplenomegaly. Bowel sounds positive.  PEG tube in place.  G-tube in place Musculoskeletal: no clubbing / cyanosis. No joint deformity upper and lower extremities. Good ROM, no contractures. Normal muscle tone.  Right-sided PICC line in place Skin: no rashes, lesions, ulcers. No induration Neurologic: CN 2-12 grossly intact. Sensation intact, DTR normal. Strength 5/5 in all 4.  Psychiatric: Normal judgment and insight. Alert and oriented x 3. Normal mood.    Decubitus Ulcers: Not present on admission Catheters and tubes: None  Data Review:    Labs on Admission: I have personally reviewed following labs and imaging studies  CBC: Recent Labs  Lab 08/02/19 1851  WBC 5.3  NEUTROABS 4.3  HGB 9.0*  HCT 26.5*  MCV 88.3  PLT 903   Basic Metabolic Panel: Recent Labs  Lab 08/02/19 1851  NA 129*  K 4.2  CL 93*  CO2 23  GLUCOSE 303*  BUN 26*  CREATININE 0.65  CALCIUM 7.9*   GFR: Estimated Creatinine Clearance: 95.7 mL/min (by C-G formula based on SCr of 0.65 mg/dL). Liver Function Tests: No results for input(s): AST, ALT, ALKPHOS, BILITOT, PROT, ALBUMIN in the last 168 hours. No results for input(s): LIPASE, AMYLASE in the last 168 hours. No results for input(s): AMMONIA in the last 168 hours. Coagulation Profile: No results for input(s): INR, PROTIME in the last 168 hours. Cardiac Enzymes: No results for input(s): CKTOTAL, CKMB, CKMBINDEX, TROPONINI in the last 168 hours. BNP (last 3 results) No results for input(s): PROBNP in the last 8760 hours. HbA1C: No results for input(s):  HGBA1C in the last 72 hours. CBG: No results for input(s): GLUCAP in the last 168 hours. Lipid Profile: No results for input(s): CHOL, HDL, LDLCALC, TRIG, CHOLHDL, LDLDIRECT in the last 72 hours. Thyroid Function Tests:  No results for input(s): TSH, T4TOTAL, FREET4, T3FREE, THYROIDAB in the last 72 hours. Anemia Panel: No results for input(s): VITAMINB12, FOLATE, FERRITIN, TIBC, IRON, RETICCTPCT in the last 72 hours. Urine analysis:    Component Value Date/Time   COLORURINE YELLOW 07/09/2019 0850   APPEARANCEUR CLEAR 07/09/2019 0850   LABSPEC 1.018 07/09/2019 0850   PHURINE 5.0 07/09/2019 0850   GLUCOSEU >=500 (A) 07/09/2019 0850   HGBUR NEGATIVE 07/09/2019 0850   BILIRUBINUR NEGATIVE 07/09/2019 0850   KETONESUR NEGATIVE 07/09/2019 0850   PROTEINUR NEGATIVE 07/09/2019 0850   NITRITE NEGATIVE 07/09/2019 0850   LEUKOCYTESUR NEGATIVE 07/09/2019 0850     Imaging Results:      Radiological Exams on Admission: CT Angio Chest PE W/Cm &/Or Wo Cm  Result Date: 08/03/2019 CLINICAL DATA:  Recently diagnosed with esophageal cancer, aspiration EXAM: CT ANGIOGRAPHY CHEST WITH CONTRAST TECHNIQUE: Multidetector CT imaging of the chest was performed using the standard protocol during bolus administration of intravenous contrast. Multiplanar CT image reconstructions and MIPs were obtained to evaluate the vascular anatomy. CONTRAST:  153m OMNIPAQUE IOHEXOL 350 MG/ML SOLN COMPARISON:  July 09, 2019 FINDINGS: Cardiovascular: There is a optimal opacification of the pulmonary arteries. There is no central,segmental, or subsegmental filling defects within the pulmonary arteries. The heart is normal in size. No pericardial effusion or thickening. No evidence right heart strain. There is normal three-vessel brachiocephalic anatomy without proximal stenosis. Coronary artery calcifications. Scattered aortic atherosclerosis is noted. Mediastinum/Nodes: No hilar, mediastinal, or axillary adenopathy. The thyroid  gland is unremarkable. There is a moderately dilated air and fluid-filled proximal to mid esophagus. There is diffuse wall thickening of the distal esophagus extending to the GE junction as on the prior exam. A right-sided PICC is seen with the tip at the superior cavoatrial junction. Lungs/Pleura: Tree-in-bud and patchy airspace opacities are seen at both lung bases. There is also areas of consolidation seen at the posterior bilateral lung bases. There is a small left pleural effusion again identified. Upper Abdomen: No acute abnormalities present in the visualized portions of the upper abdomen. Musculoskeletal: There is a heterogeneous mottled appearance throughout the osseous structures as on the prior exam. Review of the MIP images confirms the above findings. IMPRESSION: 1. No central, segmental, or subsegmental pulmonary embolism. 2. Tree-in-bud and patchy airspace opacity seen predominantly within both lower lungs, likely due to aspiration/infectious etiology. 3. Small left pleural effusion 4. Mildly dilated air and fluid-filled mid to distal esophagus with diffuse wall thickening of the GE junction. 5.  Aortic Atherosclerosis (ICD10-I70.0). Electronically Signed   By: BPrudencio PairM.D.   On: 08/03/2019 02:22   DG Chest Portable 1 View  Result Date: 08/02/2019 CLINICAL DATA:  Shortness of breath EXAM: PORTABLE CHEST 1 VIEW COMPARISON:  07/13/2019 FINDINGS: Right upper extremity approach PICC line terminates in the lower SVC. The heart size and mediastinal contours are within normal limits. Both lungs are clear. The visualized skeletal structures are unremarkable. IMPRESSION: No active disease. Electronically Signed   By: KUlyses JarredM.D.   On: 08/02/2019 19:57      Reneta Niehaus MGinette OttoMD [facility]  If 7PM-7AM, please contact night-coverage   08/03/2019, 4:18 AM

## 2019-08-03 NOTE — TOC Initial Note (Addendum)
Transition of Care [facility]) - Initial/Assessment Note    Patient Details  Name: Kurt Duran MRN: [id number] Date of Birth: 29-Oct-1945  Transition of Care [facility]) CM/SW Contact:    Salome Arnt, [place] Phone Number: 08/03/2019, 12:03 PM  Clinical Narrative:                  Pt admitted due to aspiration pneumonia. He is a resident at [facility]. Pt is skilled level of care. LCSW discussed pt with Mardene Celeste at [facility]. Per Pam with [facility], pt receiving enteral nutrition. Anticipate return to SNF when medically stable.    Expected Discharge Plan: Skilled Nursing Facility Barriers to Discharge: Continued Medical Work up         Expected Discharge Plan and Services Expected Discharge Plan: [place] Acute Care Choice: [place] arrangements for the past 2 months: [place]                                      Prior Living Arrangements/Services Living arrangements for the past 2 months: [place] Lives with:: Facility Resident                   Activities of Daily Living Home Assistive Devices/Equipment: Environmental consultant (specify type) ADL Screening (condition at time of admission) Patient's cognitive ability adequate to safely complete daily activities?: Yes Is the patient deaf or have difficulty hearing?: Yes Does the patient have difficulty seeing, even when wearing glasses/contacts?: No Does the patient have difficulty concentrating, remembering, or making decisions?: No Patient able to express need for assistance with ADLs?: Yes Does the patient have difficulty dressing or bathing?: Yes Independently performs ADLs?: No Communication: Independent Dressing (OT): Needs assistance Is this a change from baseline?: Pre-admission baseline Grooming: Independent Feeding: Needs assistance Is this a change from baseline?: Pre-admission baseline Bathing: Needs  assistance Is this a change from baseline?: Pre-admission baseline Toileting: Needs assistance Is this a change from baseline?: Pre-admission baseline In/Out Bed: Needs assistance Is this a change from baseline?: Pre-admission baseline Walks in Home: Needs assistance Is this a change from baseline?: Pre-admission baseline Does the patient have difficulty walking or climbing stairs?: Yes Weakness of Legs: Both Weakness of Arms/Hands: None                       Psych Involvement: No (comment)  Admission diagnosis:  Dehydration [E86.0] Hyponatremia [E87.1] Aspiration pneumonia ([place]) [J69.0] Thrombocytopenia (H[facility]) [D69.6] Hypoxia [R09.02] Aspiration pneumonia of both lungs, unspecified aspiration pneumonia type, unspecified part of lung ([place]) [J69.0] Patient Active Problem List   Diagnosis Date Noted  . Aspiration pneumonia ([place]) 08/03/2019  . History of removal of Port-a-Cath   . Bacteremia   . Hematemesis without nausea   . Sepsis ([place]) 07/09/2019  . S/P percutaneous endoscopic gastrostomy (PEG) tube placement ([place]) 07/09/2019  . Neutropenic fever ([place]) 07/09/2019  . Hyponatremia 07/09/2019  . Hypomagnesemia 07/09/2019  . Pancytopenia ([place]) 07/09/2019  . Septic shock ([place]) 07/09/2019  . Dehydration 07/06/2019  . Port-A-Cath in place 06/07/2019  . Protein-calorie malnutrition, severe 05/13/2019  . 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 Esophageal mass-Lower 3rd   . COPD (chronic obstructive pulmonary disease) ([place]) 05/09/2019  . DM2 (diabetes mellitus, type 2) ([place]) 05/09/2019  . GERD (gastroesophageal reflux disease) 05/09/2019  . Intractable vomiting 05/09/2019  .  HTN (hypertension) 05/09/2019  . Gout 05/09/2019  . Intractable nausea and vomiting 05/09/2019   PCP:  Glenda Chroman, MD Pharmacy:   [place], [place] [id number] [place] [id number] Phone: [phone] Fax: [phone]     Readmission Risk  Interventions Readmission Risk Prevention Plan 08/03/2019  Transportation Screening Complete  Medication Review ([place]) Complete  HRI or [place] Not Complete  HRI or Home Care Consult Pt Refusal Comments Pt from SNF  SW Recovery Care/Counseling Consult Complete  Palliative Care Screening Not [place] Complete  Some recent data might be hidden

## 2019-08-03 NOTE — Progress Notes (Signed)
Pharmacy Antibiotic Note  Kurt Duran is a 74 y.o. male admitted on 08/02/2019 with pneumonia.  Pharmacy has been consulted for Vancomycin/Cefepime dosing. WBC WNL. Renal function good.   Plan: Vancomycin 1250 mg IV q12h >>Estimated AUC: 419 Cefepime 2g IV q8h Trend WBC, temp, renal function  F/U infectious work-up Drug levels as indicated   Height: 6\' 1"  (185.4 cm) Weight: 93 kg (205 lb 0.4 oz) IBW/kg (Calculated) : 79.9  Temp (24hrs), Avg:98.4 F (36.9 C), Min:98.3 F (36.8 C), Max:98.4 F (36.9 C)  Recent Labs  Lab 08/02/19 1851  WBC 5.3  CREATININE 0.65    Estimated Creatinine Clearance: 95.7 mL/min (by C-G formula based on SCr of 0.65 mg/dL).    Allergies  Allergen Reactions  . Eggs Or Egg-Derived Products Nausea And Vomiting   Narda Bonds, PharmD, BCPS Clinical Pharmacist Phone: [phone]

## 2019-08-03 NOTE — Discharge Instructions (Addendum)
Patient's chest x-ray negative no signs of pneumonia and certainly no signs of aspiration pneumonia.  Patient stable here on 4 L of nasal cannula oxygen.  Sodium here was 129.  There was some clinical evidence of mild dehydration.  Patient received IV fluids feels better.  The sodium of 129 does not meet threshold criteria for admission.  Patient states he definitely feels better after the IV hydration.  Patient stable for discharge back to rehab facility.  Also no evidence of pulmonary embolism.

## 2019-08-03 NOTE — Evaluation (Signed)
Physical Therapy Evaluation Patient Details Name: Kurt Duran MRN: [id number] DOB: September 25, 1945 Today's Date: 08/03/2019   History of Present Illness  Kurt Duran is a 74 y.o. male with medical history significant of esophageal cancer, diabetes mellitus type 2, chronic hyponatremia, GERD who presented from the nursing home with worsening shortness of breath and cough.  He was noted to have hypoxemia in the nursing home.  Patient was admitted to this facility from 07/09/2019-07/16/2019 with streptococcal bacteremia.  Patient has a known history of esophageal cancer and is on PEG tube feeding and is followed by heme-onc and receives chemo and radiation.  Last chemo was on 07/06/2019.  Gets feeds and medications through G-tube.  Had a PICC line placed on recent admission for IV antibiotics for 6 weeks.  Was sent in from the nursing home due to persistent hypoxemia with saturation in the 80s on room air.  Has not been on home oxygen.  Was requiring 5 to 6 L oxygen in the ER.  Was noted to be slightly more lethargic.No significant chest pain, abdominal pain, nausea, vomiting, dysuria, diarrhea.  Has chronic lower extremity weakness.  Receives physical therapy in the nursing home.    Clinical Impression  Patient demonstrates slow labored movement for sitting up at bedside with head of bed raised, at severe risk for falls and limited to a few steps at bedside, once fatigued had to lean over RW for support and tolerated sitting up in chair after therapy with his spouse present in room.  Patient will benefit from continued physical therapy in hospital and recommended venue below to increase strength, balance, endurance for safe ADLs and gait.     Follow Up Recommendations SNF    Equipment Recommendations  None recommended by PT    Recommendations for Other Services       Precautions / Restrictions Precautions Precautions: Fall Restrictions Weight Bearing Restrictions: No      Mobility  Bed  Mobility Overal bed mobility: Needs Assistance Bed Mobility: Supine to Sit     Supine to sit: Min assist;HOB elevated     General bed mobility comments: slow labored  movement with head of bed raised  Transfers Overall transfer level: Needs assistance Equipment used: Rolling walker (2 wheeled) Transfers: Stand Pivot Transfers;Sit to/from Stand Sit to Stand: Min assist;Mod assist Stand pivot transfers: Mod assist       General transfer comment: increased time, labored movement  Ambulation/Gait Ambulation/Gait assistance: Mod assist Gait Distance (Feet): 10 Feet Assistive device: Rolling walker (2 wheeled) Gait Pattern/deviations: Decreased step length - left;Decreased step length - right;Decreased stride length Gait velocity: decreased   General Gait Details: limited to taking steps at bedside demonstrating slow labored cadence and has to lean over RW once fatigued  Stairs            Wheelchair Mobility    Modified Rankin (Stroke Patients Only)       Balance Overall balance assessment: Needs assistance Sitting-balance support: Feet supported;No upper extremity supported Sitting balance-Leahy Scale: Fair Sitting balance - Comments: fair/good seated at EOB   Standing balance support: During functional activity;Bilateral upper extremity supported Standing balance-Leahy Scale: Poor Standing balance comment: fair/poor using RW                             Pertinent Vitals/Pain Pain Assessment: No/denies pain    Home Living Family/patient expects to be discharged to:: Private residence Living Arrangements: Spouse/significant other Available Help  at Discharge: Family;Available 24 hours/day Type of Home: House Home Access: Ramped entrance     Home Layout: One level Home Equipment: Cane - single point Additional Comments: tripod cane    Prior Function Level of Independence: Independent with assistive device(s);Needs assistance   Gait /  Transfers Assistance Needed: household and limited distance community ambulator with [facility] as needed  ADL's / Homemaking Assistance Needed: Patient mostly independent but wife assists PRN        Hand Dominance        Extremity/Trunk Assessment   Upper Extremity Assessment Upper Extremity Assessment: Generalized weakness    Lower Extremity Assessment Lower Extremity Assessment: Generalized weakness    Cervical / Trunk Assessment Cervical / Trunk Assessment: Normal  Communication   Communication: No difficulties  Cognition Arousal/Alertness: Awake/alert Behavior During Therapy: WFL for tasks assessed/performed Overall Cognitive Status: Within Functional Limits for tasks assessed                                        General Comments      Exercises     Assessment/Plan    PT Assessment Patient needs continued PT services  PT Problem List Decreased strength;Decreased mobility;Decreased activity tolerance;Decreased balance;Decreased knowledge of use of DME       PT Treatment Interventions Gait training;Stair training;Functional mobility training;Therapeutic activities;Therapeutic exercise;Patient/family education    PT Goals (Current goals can be found in the Care Plan section)  Acute Rehab PT Goals Patient Stated Goal: Get stronger and return home after rehab PT Goal Formulation: With patient/family Time For Goal Achievement: 08/17/19 Potential to Achieve Goals: Good    Frequency Min 3X/week   Barriers to discharge        Co-evaluation               AM-PAC PT "6 Clicks" Mobility  Outcome Measure Help needed turning from your back to your side while in a flat bed without using bedrails?: A Little Help needed moving from lying on your back to sitting on the side of a flat bed without using bedrails?: A Little Help needed moving to and from a bed to a chair (including a wheelchair)?: A Lot Help needed standing up from a chair using your  arms (e.g., wheelchair or bedside chair)?: A Little Help needed to walk in hospital room?: A Lot Help needed climbing 3-5 steps with a railing? : Total 6 Click Score: 14    End of Session Equipment Utilized During Treatment: Oxygen Activity Tolerance: Patient tolerated treatment well;Patient limited by fatigue Patient left: in chair;with call bell/phone within reach;with family/visitor present Nurse Communication: Mobility status PT Visit Diagnosis: Unsteadiness on feet (R26.81);Other abnormalities of gait and mobility (R26.89);Muscle weakness (generalized) (M62.81)    Time: [id number] PT Time Calculation (min) (ACUTE ONLY): 29 min   Charges:   PT Evaluation $PT Eval Moderate Complexity: 1 Mod PT Treatments $Therapeutic Activity: 23-37 mins        3:51 PM, 08/03/19 [name], MPT Physical Therapist with [facility] 336 [phone] office [phone] mobile phone

## 2019-08-03 NOTE — Plan of Care (Signed)
  Problem: Acute Rehab PT Goals(only PT should resolve) Goal: Pt Will Go Supine/Side To Sit Outcome: Progressing Flowsheets (Taken 08/03/2019 1552) Pt will go Supine/Side to Sit:  with min guard assist  with supervision Goal: Patient Will Transfer Sit To/From Stand Outcome: Progressing Flowsheets (Taken 08/03/2019 1552) Patient will transfer sit to/from stand: with min guard assist Goal: Pt Will Transfer Bed To Chair/Chair To Bed Outcome: Progressing Flowsheets (Taken 08/03/2019 1552) Pt will Transfer Bed to Chair/Chair to Bed:  with min assist  min guard assist Goal: Pt Will Ambulate Outcome: Progressing Flowsheets (Taken 08/03/2019 1552) Pt will Ambulate:  25 feet  with minimal assist  with moderate assist  with rolling walker   3:52 PM, 08/03/19 [name], MPT Physical Therapist with [facility] [phone] office [phone] mobile phone

## 2019-08-03 NOTE — ED Provider Notes (Signed)
Patient was left at change of shift to get results of his CT scan.  When I walk in the room patient seems to be breathing easily, he is on oxygen at 4 L/min nasal cannula.  He states he has been coughing.  He is noted to have a tachycardia on the monitor.  When I touch his skin he feels very warm to touch, having nurses recheck his temperature.  He is noted to have some rales at the bases of his lungs.  We discussed his CT result.  If he has a fever if you like he should stay in the hospital.  Otherwise Dr. Rogene Houston had felt like he could be released back to his facility.  Pt's repeat temp was still normal. However, he is still tachycardia, tachypneic and requiring oxygen.   4:11 AM Dr Scherrie November, hospitalist, will admit.   CT Angio Chest PE W/Cm &/Or Wo Cm  Result Date: 08/03/2019 CLINICAL DATA:  Recently diagnosed with esophageal cancer, aspiration EXAM: CT ANGIOGRAPHY CHEST WITH CONTRAST TECHNIQUE: Multidetector CT imaging of the chest was performed using the standard protocol during bolus administration of intravenous contrast. Multiplanar CT image reconstructions and MIPs were obtained to evaluate the vascular anatomy. CONTRAST:  137mL OMNIPAQUE IOHEXOL 350 MG/ML SOLN COMPARISON:  July 09, 2019 FINDINGS: Cardiovascular: There is a optimal opacification of the pulmonary arteries. There is no central,segmental, or subsegmental filling defects within the pulmonary arteries. The heart is normal in size. No pericardial effusion or thickening. No evidence right heart strain. There is normal three-vessel brachiocephalic anatomy without proximal stenosis. Coronary artery calcifications. Scattered aortic atherosclerosis is noted. Mediastinum/Nodes: No hilar, mediastinal, or axillary adenopathy. The thyroid gland is unremarkable. There is a moderately dilated air and fluid-filled proximal to mid esophagus. There is diffuse wall thickening of the distal esophagus extending to the GE junction as on the prior exam. A  right-sided PICC is seen with the tip at the superior cavoatrial junction. Lungs/Pleura: Tree-in-bud and patchy airspace opacities are seen at both lung bases. There is also areas of consolidation seen at the posterior bilateral lung bases. There is a small left pleural effusion again identified. Upper Abdomen: No acute abnormalities present in the visualized portions of the upper abdomen. Musculoskeletal: There is a heterogeneous mottled appearance throughout the osseous structures as on the prior exam. Review of the MIP images confirms the above findings. IMPRESSION: 1. No central, segmental, or subsegmental pulmonary embolism. 2. Tree-in-bud and patchy airspace opacity seen predominantly within both lower lungs, likely due to aspiration/infectious etiology. 3. Small left pleural effusion 4. Mildly dilated air and fluid-filled mid to distal esophagus with diffuse wall thickening of the GE junction. 5.  Aortic Atherosclerosis (ICD10-I70.0). Electronically Signed   By: Prudencio Pair M.D.   On: 08/03/2019 02:22         Rolland Porter, MD 08/03/19 [phone]

## 2019-08-03 NOTE — ED Provider Notes (Addendum)
[facility] EMERGENCY DEPARTMENT Provider Note   CSN: [id number] Arrival date & time: 08/02/19  1800     History Chief Complaint  Patient presents with  . Abnormal Lab  . Aspiration    Kurt Duran is a 74 y.o. male.  Patient with recent hospitalist admission for neutropenic fever.  He was admitted from April 3 through April 10.  Patient with known esophageal cancer.  Followed by hematology oncology here.  Patient currently at [facility] rehab center in [place].  Patient is not able to take anything by mouth.  Gets everything through G-tube.  Patient also has a PICC line in the right upper arm area.  Still receiving IV antibiotics to that.  Referred in for concerns for aspiration pneumonia.  Apparently had oxygen saturations of's 89% on 6 L.  Patient's been a little more lethargic.  They had a sodium level of 126.  Patient's accompanied by his wife.  She was informed that they felt that he needed to be admitted because they could not give him IV fluids there and because of his hyponatremia and because of the aspiration.  Patient did not require any supplemental oxygenation until the event that occurred yesterday.  Patient's past medical history otherwise significant for COPD and diabetes.        Past Medical History:  Diagnosis Date  . Anxiety   . Arthritis   . COPD (chronic obstructive pulmonary disease) ([place])   . Diabetes mellitus without complication ([place])   . Diabetic retinopathy ([place])    NPDR OU  . Esophageal cancer ([place])   . GERD (gastroesophageal reflux disease)   . Gout   . Hypertension   . Hypertensive retinopathy    OU  . Port-A-Cath in place 06/07/2019    Patient Active Problem List   Diagnosis Date Noted  . History of removal of Port-a-Cath   . Bacteremia   . Hematemesis without nausea   . Sepsis ([place]) 07/09/2019  . S/P percutaneous endoscopic gastrostomy (PEG) tube placement ([place]) 07/09/2019  . Neutropenic fever ([place]) 07/09/2019  . Hyponatremia 07/09/2019  .  Hypomagnesemia 07/09/2019  . Pancytopenia ([place]) 07/09/2019  . Septic shock ([place]) 07/09/2019  . Dehydration 07/06/2019  . Port-A-Cath in place 06/07/2019  . Protein-calorie malnutrition, severe 05/13/2019  . 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 Esophageal mass-Lower 3rd   . COPD (chronic obstructive pulmonary disease) ([place]) 05/09/2019  . DM2 (diabetes mellitus, type 2) ([place]) 05/09/2019  . GERD (gastroesophageal reflux disease) 05/09/2019  . Intractable vomiting 05/09/2019  . HTN (hypertension) 05/09/2019  . Gout 05/09/2019  . Intractable nausea and vomiting 05/09/2019    Past Surgical History:  Procedure Laterality Date  . BIOPSY  05/10/2019   Procedure: BIOPSY;  Surgeon: Danie Binder, MD;  Location: [facility] ENDO SUITE;  Service: Endoscopy;;  gastric esophageal  . CATARACT EXTRACTION W/PHACO Left 04/03/2016   Procedure: CATARACT EXTRACTION PHACO AND INTRAOCULAR LENS PLACEMENT LEFT EYE CDE= 11.33;  Surgeon: Tonny Branch, MD;  Location: [facility] ORS;  Service: Ophthalmology;  Laterality: Left;  left  . CATARACT EXTRACTION W/PHACO Right 04/28/2016   Procedure: CATARACT EXTRACTION PHACO AND INTRAOCULAR LENS PLACEMENT (IOC);  Surgeon: Tonny Branch, MD;  Location: [facility] ORS;  Service: Ophthalmology;  Laterality: Right;  [id number]   . ESOPHAGOGASTRODUODENOSCOPY (EGD) WITH PROPOFOL N/A 05/10/2019   Fields: medium sized ulcerating mass with bleeding found in distal esophagus, partially obstructing and circumferential (adenocarcinoma). Gastritis (benign bx) and duodenitis  . EYE SURGERY Bilateral    Cat Sx OU  .  JEJUNOSTOMY N/A 05/13/2019   Procedure: JEJUNOSTOMY FEEDING TUBE PLACEMENT (Procedure #2);  Surgeon: Aviva Signs, MD;  Location: [facility] ORS;  Service: General;  Laterality: N/A;  . KNEE ARTHROSCOPY Right   . NECK SURGERY    . PORTACATH PLACEMENT Right 05/13/2019   Procedure: INSERTION PORT-A-CATH (attached catheter in right subclavian) (Procedure #1);  Surgeon: Aviva Signs, MD;  Location: [facility]  ORS;  Service: General;  Laterality: Right;  . ROTATOR CUFF REPAIR Right        Family History  Adopted: Yes  Problem Relation Age of Onset  . Hypertension Brother   . Kidney disease Brother     Social History   Tobacco Use  . Smoking status: Former Smoker    Types: Cigarettes    Quit date: 03/26/1989    Years since quitting: 30.3  . Smokeless tobacco: Never Used  Substance Use Topics  . Alcohol use: No  . Drug use: No    Home Medications Prior to Admission medications   Medication Sig Start Date End Date Taking? Authorizing Provider  acetaminophen (TYLENOL) 325 MG tablet Place 2 tablets (650 mg total) into feeding tube every 6 (six) hours as needed for mild pain (or Fever >/= 101). 05/16/19  Yes Elgergawy, Silver Huguenin, MD  albuterol (PROVENTIL HFA;VENTOLIN HFA) 108 (90 Base) MCG/ACT inhaler Inhale 2 puffs into the lungs every 4 (four) hours as needed for wheezing or shortness of breath.   Yes [provider]  albuterol (PROVENTIL) (2.5 MG/3ML) 0.083% nebulizer solution Take 2.5 mg by nebulization every 6 (six) hours as needed for wheezing or shortness of breath.   Yes [provider]  ALPRAZolam (XANAX) 0.5 MG tablet 1 tablet (0.5 mg total) by Per J Tube route 2 (two) times daily. 07/16/19  Yes Barton Dubois, MD  Amino Acids-Protein Hydrolys (FEEDING SUPPLEMENT, PRO-STAT SUGAR FREE 64,) LIQD 30 mLs by Per J Tube route 2 (two) times daily. 05/16/19  Yes Elgergawy, Silver Huguenin, MD  cefTRIAXone (ROCEPHIN) IVPB Inject 2 g into the vein daily. Indication:   bacteremia Last Day of Therapy:  08/22/19 Labs - Once weekly:  CBC/D and BMP, Labs - Every other week:  ESR and CRP Patient taking differently: Inject 2 g into the vein daily. Indication:   bacteremia Last Day of Therapy:  08/22/19 Labs - Once weekly:  CBC/D and BMP, Labs - Every other week:  ESR and CRP Total of 5 weeks and 2 day course 07/16/19  Yes Barton Dubois, MD  famotidine (PEPCID) 20 MG tablet Place 1 tablet  (20 mg total) into feeding tube at bedtime. 07/17/19  Yes Barton Dubois, MD  FLUoxetine (PROZAC) 20 MG capsule Place 1 capsule (20 mg total) into feeding tube daily. 05/16/19  Yes Elgergawy, Silver Huguenin, MD  glipiZIDE (GLUCOTROL) 10 MG tablet Place 0.5-1 tablets (5-10 mg total) into feeding tube 2 (two) times daily before a meal. Take 10 mg in the morning and 5 mg in the evening 07/16/19  Yes Barton Dubois, MD  lidocaine-prilocaine (EMLA) cream Apply a pea-sized amount to port a cath site and cover with plastic wrap one hour prior to chemotherapy appointments Patient taking differently: Apply 1 application topically See admin instructions. Apply a pea-sized amount to port a cath site and cover with plastic wrap one hour prior to chemotherapy appointments 06/07/19  Yes Derek Jack, MD  metFORMIN (GLUCOPHAGE) 1000 MG tablet 1 tablet (1,000 mg total) by Per J Tube route 2 (two) times daily. 05/16/19  Yes Elgergawy, Silver Huguenin, MD  Nutritional Supplements (FEEDING SUPPLEMENT, OSMOLITE 1.5 CAL,) LIQD Place 1,000 mLs into feeding tube continuous. 05/16/19  Yes Elgergawy, Silver Huguenin, MD  ondansetron (ZOFRAN-ODT) 4 MG disintegrating tablet 4 mg every 6 (six) hours as needed for nausea or vomiting (G-Tube).  06/03/19  Yes [provider]  PACLITAXEL IV Inject 50 mg/m2 into the vein once a week.  06/15/19  Yes [provider]  pantoprazole sodium (PROTONIX) 40 mg/20 mL PACK Place 20 mLs (40 mg total) into feeding tube 2 (two) times daily. 07/16/19  Yes Barton Dubois, MD  prochlorperazine (COMPAZINE) 10 MG tablet Take 1 tablet (10 mg total) by mouth every 6 (six) hours as needed (given per tube for Nausea or vomiting). 07/16/19  Yes Barton Dubois, MD  promethazine (PHENERGAN) 25 MG suppository Place 25 mg rectally every 6 (six) hours as needed. 06/03/19  Yes [provider]  sodium chloride 1 g tablet 2 tablets (2 g total) by Per J Tube route 3 (three) times daily with meals. 07/16/19  Yes Barton Dubois, MD  terazosin (HYTRIN) 10 MG capsule 1 capsule (10 mg total) by Per J Tube route daily. 05/16/19  Yes Elgergawy, Silver Huguenin, MD  Water For Irrigation, Sterile (FREE WATER) SOLN Place 250 mLs into feeding tube every 8 (eight) hours. 07/16/19  Yes Barton Dubois, MD    Allergies    Eggs or egg-derived products  Review of Systems   Review of Systems  Constitutional: Positive for fatigue. Negative for chills and fever.  HENT: Negative for congestion, rhinorrhea and sore throat.   Eyes: Negative for visual disturbance.  Respiratory: Negative for cough and shortness of breath.   Cardiovascular: Negative for chest pain and leg swelling.  Gastrointestinal: Negative for abdominal pain, diarrhea, nausea and vomiting.  Genitourinary: Negative for dysuria.  Musculoskeletal: Negative for back pain and neck pain.  Skin: Negative for rash.  Neurological: Negative for dizziness, light-headedness and headaches.  Hematological: Does not bruise/bleed easily.  Psychiatric/Behavioral: Negative for confusion.    Physical Exam Updated Vital Signs BP 106/62   Pulse (!) 110   Temp 98.3 F (36.8 C) (Oral)   Resp (!) 34   Ht 1.854 m (6' 1")   Wt 93 kg   SpO2 95%   BMI 27.05 kg/m   Physical Exam Vitals and nursing note reviewed.  Constitutional:      Appearance: Normal appearance. He is well-developed.  HENT:     Head: Normocephalic and atraumatic.     Mouth/Throat:     Mouth: Mucous membranes are dry.  Eyes:     Extraocular Movements: Extraocular movements intact.     Conjunctiva/sclera: Conjunctivae normal.     Pupils: Pupils are equal, round, and reactive to light.  Cardiovascular:     Rate and Rhythm: Normal rate and regular rhythm.     Heart sounds: No murmur.  Pulmonary:     Effort: Pulmonary effort is normal. No respiratory distress.     Breath sounds: Normal breath sounds.  Abdominal:     Palpations: Abdomen is soft.     Tenderness: There is no abdominal tenderness.      Comments: G-tube in place.  Musculoskeletal:        General: Normal range of motion.     Cervical back: Normal range of motion and neck supple.     Comments: PICC line right upper extremity  Skin:    General: Skin is warm and dry.     Capillary Refill: Capillary refill takes less than 2  seconds.  Neurological:     General: No focal deficit present.     Mental Status: He is alert and oriented to person, place, and time.     Cranial Nerves: No cranial nerve deficit.     Sensory: No sensory deficit.     Motor: No weakness.     ED Results / Procedures / Treatments   Labs (all labs ordered are listed, but only abnormal results are displayed) Labs Reviewed  CBC WITH DIFFERENTIAL/PLATELET - Abnormal; Notable for the following components:      Result Value   RBC 3.00 (*)    Hemoglobin 9.0 (*)    HCT 26.5 (*)    Lymphs Abs 0.6 (*)    All other components within normal limits  BASIC METABOLIC PANEL - Abnormal; Notable for the following components:   Sodium 129 (*)    Chloride 93 (*)    Glucose, Bld 303 (*)    BUN 26 (*)    Calcium 7.9 (*)    All other components within normal limits   Results for orders placed or performed during the hospital encounter of 08/02/19  CBC with Differential  Result Value Ref Range   WBC 5.3 4.0 - 10.5 K/uL   RBC 3.00 (L) 4.22 - 5.81 MIL/uL   Hemoglobin 9.0 (L) 13.0 - 17.0 g/dL   HCT 26.5 (L) 39.0 - 52.0 %   MCV 88.3 80.0 - 100.0 fL   MCH 30.0 26.0 - 34.0 pg   MCHC 34.0 30.0 - 36.0 g/dL   RDW 14.5 11.5 - 15.5 %   Platelets 219 150 - 400 K/uL   nRBC 0.0 0.0 - 0.2 %   Neutrophils Relative % 80 %   Neutro Abs 4.3 1.7 - 7.7 K/uL   Lymphocytes Relative 12 %   Lymphs Abs 0.6 (L) 0.7 - 4.0 K/uL   Monocytes Relative 6 %   Monocytes Absolute 0.3 0.1 - 1.0 K/uL   Eosinophils Relative 0 %   Eosinophils Absolute 0.0 0.0 - 0.5 K/uL   Basophils Relative 1 %   Basophils Absolute 0.0 0.0 - 0.1 K/uL   WBC Morphology INCREASED BANDS (>20% BANDS)     Immature Granulocytes 1 %   Abs Immature Granulocytes 0.04 0.00 - 0.07 K/uL  Basic metabolic panel  Result Value Ref Range   Sodium 129 (L) 135 - 145 mmol/L   Potassium 4.2 3.5 - 5.1 mmol/L   Chloride 93 (L) 98 - 111 mmol/L   CO2 23 22 - 32 mmol/L   Glucose, Bld 303 (H) 70 - 99 mg/dL   BUN 26 (H) 8 - 23 mg/dL   Creatinine, Ser 0.65 0.61 - 1.24 mg/dL   Calcium 7.9 (L) 8.9 - 10.3 mg/dL   GFR calc non Af Amer >60 >60 mL/min   GFR calc Af Amer >60 >60 mL/min   Anion gap 13 5 - 15     EKG EKG Interpretation  Date/Time:  Tuesday August 02 2019 18:26:29 EDT Ventricular Rate:  111 PR Interval:    QRS Duration: 90 QT Interval:  336 QTC Calculation: 455 R Axis:   28 Text Interpretation: Sinus tachycardia Low voltage, precordial leads Borderline ST depression, inferior leads Baseline wander in lead(s) V3 V4 Confirmed by Fredia Sorrow [phone]) on 08/02/2019 8:34:16 PM   Radiology DG Chest Portable 1 View  Result Date: 08/02/2019 CLINICAL DATA:  Shortness of breath EXAM: PORTABLE CHEST 1 VIEW COMPARISON:  07/13/2019 FINDINGS: Right upper extremity approach PICC line terminates in the  lower SVC. The heart size and mediastinal contours are within normal limits. Both lungs are clear. The visualized skeletal structures are unremarkable. IMPRESSION: No active disease. Electronically Signed   By: Ulyses Jarred M.D.   On: 08/02/2019 19:57    Procedures Procedures (including critical care time)  Medications Ordered in ED Medications  0.9 %  sodium chloride infusion ( Intravenous New Bag/Given 08/02/19 2125)  sodium chloride 0.9 % bolus 500 mL (500 mLs Intravenous New Bag/Given 08/02/19 2125)    ED Course  I have reviewed the triage vital signs and the nursing notes.  Pertinent labs & imaging results that were available during my care of the patient were reviewed by me and considered in my medical decision making (see chart for details).    MDM Rules/Calculators/A&P                       Is mucous membranes slightly dry here.  Patient oxygenating well on 4 L of oxygen.  No hypoxia on the 4 L.  Patient sodium here 129 not in a critical range.  Chest x-ray negative for any pulmonary abnormalities no evidence of any infiltrates.  So doubt any significant aspiration.  Patient will be given some IV fluids here.  Patient received 500 cc bolus and then 100 cc an hour.  Almost has received 1 L.  Feeling better.  Patient states that he feels much better with the fluid.  Patient does have a PICC line in the left upper extremity it seems as if the facility could use that for IV hydration if necessary.  Patient does have G-tube where he can receive hydration through that.  Patient will need to follow-up with hematology oncology.  Patient was evaluated looking for reason for readmission.  No evidence of free admission.  White blood cell count here today to 5.3.  And differential shows no neutropenia.  In addition patient's renal function was very reasonable.  Patient did show some clinical evidence of some mild dehydration seems improved with some IV fluids.  Which we will hold in for a while.  No evidence of any significant hyponatremia requiring admission no evidence of any significant pulmonary disease process requiring admission.  Patient patient in no respiratory distress.  However due to patient esophageal cancer is a set up for pulmonary embolus.  Is showing persistent mild tachycardia.  So CT angio would be done to rule out pulmonary embolus.  If negative patient stable for discharge back to rehab facility.  If positive will require admission.  Also CT chest will give a better look for any concerns for pneumonia or aspiration pneumonia that was not evident on regular CT.  CT angio will be reviewed by the overnight emergency physician Dr. Tomi Bamberger.  Final Clinical Impression(s) / ED Diagnoses Final diagnoses:  Hyponatremia  Dehydration    Rx / DC Orders ED Discharge Orders    None        Fredia Sorrow, MD 08/03/19 0041    Fredia Sorrow, MD 08/03/19 [phone]

## 2019-08-04 DIAGNOSIS — J449 Chronic obstructive pulmonary disease, unspecified: Secondary | ICD-10-CM

## 2019-08-04 DIAGNOSIS — E119 Type 2 diabetes mellitus without complications: Secondary | ICD-10-CM

## 2019-08-04 DIAGNOSIS — C159 Malignant neoplasm of esophagus, unspecified: Secondary | ICD-10-CM

## 2019-08-04 DIAGNOSIS — K219 Gastro-esophageal reflux disease without esophagitis: Secondary | ICD-10-CM

## 2019-08-04 DIAGNOSIS — I10 Essential (primary) hypertension: Secondary | ICD-10-CM

## 2019-08-04 LAB — BASIC METABOLIC PANEL
Anion gap: 7 (ref 5–15)
BUN: 19 mg/dL (ref 8–23)
CO2: 26 mmol/L (ref 22–32)
Calcium: 7.7 mg/dL — ABNORMAL LOW (ref 8.9–10.3)
Chloride: 101 mmol/L (ref 98–111)
Creatinine, Ser: 0.4 mg/dL — ABNORMAL LOW (ref 0.61–1.24)
GFR calc Af Amer: >60 mL/min (ref >60)
GFR calc non Af Amer: >60 mL/min (ref >60)
Glucose, Bld: 212 mg/dL — ABNORMAL HIGH (ref 70–99)
Potassium: 3.5 mmol/L (ref 3.5–5.1)
Sodium: 134 mmol/L — ABNORMAL LOW (ref 135–145)

## 2019-08-04 LAB — GLUCOSE, CAPILLARY
Glucose-Capillary: 162 mg/dL — ABNORMAL HIGH (ref 70–99)
Glucose-Capillary: 175 mg/dL — ABNORMAL HIGH (ref 70–99)
Glucose-Capillary: 185 mg/dL — ABNORMAL HIGH (ref 70–99)
Glucose-Capillary: 194 mg/dL — ABNORMAL HIGH (ref 70–99)
Glucose-Capillary: 216 mg/dL — ABNORMAL HIGH (ref 70–99)

## 2019-08-04 LAB — CBC
HCT: 24.8 % — ABNORMAL LOW (ref 39.0–52.0)
Hemoglobin: 8.1 g/dL — ABNORMAL LOW (ref 13.0–17.0)
MCH: 29.7 pg (ref 26.0–34.0)
MCHC: 32.7 g/dL (ref 30.0–36.0)
MCV: 90.8 fL (ref 80.0–100.0)
Platelets: 215 10*3/uL (ref 150–400)
RBC: 2.73 MIL/uL — ABNORMAL LOW (ref 4.22–5.81)
RDW: 14.9 % (ref 11.5–15.5)
WBC: 2.9 10*3/uL — ABNORMAL LOW (ref 4.0–10.5)
nRBC: 0 % (ref 0.0–0.2)

## 2019-08-04 MED ORDER — SODIUM CHLORIDE 0.9 % IV SOLN: INTRAVENOUS | Status: DC

## 2019-08-04 MED ORDER — METOPROLOL TARTRATE 25 MG PO TABS
12.5000 mg | ORAL_TABLET | Freq: Two times a day (BID) | ORAL | Status: DC
  Administered 2019-08-04 – 2019-08-06 (×5): 12.5 mg
  Filled 2019-08-04 (×5): qty 1

## 2019-08-04 MED ORDER — SODIUM CHLORIDE 0.9 % IV BOLUS
500.0000 mL | Freq: Once | INTRAVENOUS | Status: AC
  Administered 2019-08-04: 01:00:00 500 mL via INTRAVENOUS

## 2019-08-04 MED ORDER — METOPROLOL TARTRATE 5 MG/5ML IV SOLN
5.0000 mg | Freq: Once | INTRAVENOUS | Status: AC
  Administered 2019-08-04: 5 mg via INTRAVENOUS
  Filled 2019-08-04: qty 5

## 2019-08-04 MED ORDER — METOPROLOL TARTRATE 25 MG/10 ML ORAL SUSPENSION: 12.5000 mg | Freq: Two times a day (BID) | ORAL | Status: DC

## 2019-08-04 NOTE — Progress Notes (Signed)
PROGRESS NOTE    Kurt Duran  [id number] DOB: 1945/12/20 DOA: 08/02/2019 PCP: Glenda Chroman, MD    Brief Narrative:  74 year old male with a history of esophageal cancer who is PEG tube dependent, with recent admission for bacteremia requiring long-term IV to biotics.  He was discharged to a nursing facility.  Patient returned to the hospital with what appears to be aspiration pneumonia.  Started on IV antibiotics.  Hospital course complicated by development of transient SVT which resolved after receiving IV metoprolol.   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 Acute respiratory failure with hypoxia secondary to aspiration pneumonia.  Currently on 3-4 L oxygen.  He is currently on cefepime.  He is not having fevers and his overall condition appears to be stabilizing.  Continue to wean down oxygen as tolerated.  Encourage pulmonary hygiene. 2. SVT.  Patient developed SVT overnight requiring transfer to stepdown unit.  Patient received intravenous metoprolol which improved his heart rate.  Blood pressure has been stabilized. 3. Esophageal cancer.  Continue follow-up with oncology for further chemotherapy/radiation.  Patient is PEG tube dependent for feeding/medications. 4. GERD.  Continue on PPI twice a day. 5. Type 2 diabetes.  On oral hypoglycemics.  Will hold for now and has been started on sliding scale insulin.  Blood sugars currently stable.   DVT prophylaxis: Lovenox Code Status: DNR Family Communication: Discussed with wife at the bedside Disposition Plan: Status is: Inpatient  Remains inpatient appropriate because:Inpatient level of  care appropriate due to severity of illness patient needs to be monitored on telemetry for recurrence of SVT.  He is also still requiring supplemental oxygen will need to be weaned down.   Dispo: The patient is from: SNF              Anticipated d/c is to: SNF              Anticipated d/c date is: 2 days              Patient currently is not medically stable to d/c.    Consultants:     Procedures:     Antimicrobials:   Cefepime 4/28>   Subjective: Denies any chest pain, shortness of breath.  Events of overnight noted.  Patient developed SVT and required transfer to stepdown unit.  He was tachycardic/hypotensive.  Heart rate has since stabilized and blood pressure has improved.  Objective: Vitals:   08/04/19 1600 08/04/19 1606 08/04/19 1923 08/04/19 2025  BP: 107/67     Pulse: 81 83    Resp: 20 (!) 26    Temp:  98.2 F (36.8 C)  98 F (36.7 C)  TempSrc:  Oral  Oral  SpO2: 98% 97% 99%   Weight:      Height:        Intake/Output Summary (Last 24 hours) at 08/04/2019 2035 Last data filed at 08/04/2019 1800 Gross per 24 hour  Intake 3892.22 ml  Output 1300 ml  Net 2592.22 ml   Filed Weights   08/02/19 1829 08/03/19 0500 08/04/19 0052  Weight: 93 kg 81.2 kg 82.3 kg    Examination:  General exam: Appears  calm and comfortable  Respiratory system: Clear to auscultation. Respiratory effort normal. Cardiovascular system: S1 & S2 heard, RRR. No JVD, murmurs, rubs, gallops or clicks. No pedal edema. Gastrointestinal system: Abdomen is nondistended, soft and nontender. No organomegaly or masses felt. Normal bowel sounds heard.  G-tube in place Central nervous system: Alert and oriented. No focal neurological deficits. Extremities: Symmetric 5 x 5 power. Skin: No rashes, lesions or ulcers Psychiatry: Judgement and insight appear normal. Mood & affect appropriate.     Data Reviewed: I have personally reviewed following labs and imaging studies  CBC: Recent Labs    Lab August 05, 2019 1851 08/03/19 0626 08/04/19 0413  WBC 5.3 4.0 2.9*  NEUTROABS 4.3  --   --   HGB 9.0* 8.6* 8.1*  HCT 26.5* 26.1* 24.8*  MCV 88.3 88.5 90.8  PLT 219 202 [id number]   Basic Metabolic Panel: Recent Labs  Lab 08-05-2019 1851 08/03/19 0626 08/04/19 0413  NA 129* 131* 134*  K 4.2 3.9 3.5  CL 93* 93* 101  CO2 23 24 26   GLUCOSE 303* 215* 212*  BUN 26* 22 19  CREATININE 0.65 0.42* 0.40*  CALCIUM 7.9* 8.2* 7.7*   GFR: Estimated Creatinine Clearance: 95.7 mL/min (A) (by C-G formula based on SCr of 0.4 mg/dL (L)). Liver Function Tests: No results for input(s): AST, ALT, ALKPHOS, BILITOT, PROT, ALBUMIN in the last 168 hours. No results for input(s): LIPASE, AMYLASE in the last 168 hours. No results for input(s): AMMONIA in the last 168 hours. Coagulation Profile: No results for input(s): INR, PROTIME in the last 168 hours. Cardiac Enzymes: No results for input(s): CKTOTAL, CKMB, CKMBINDEX, TROPONINI in the last 168 hours. BNP (last 3 results) No results for input(s): PROBNP in the last 8760 hours. HbA1C: No results for input(s): HGBA1C in the last 72 hours. CBG: Recent Labs  Lab 08/04/19 0000 08/04/19 0903 08/04/19 1215 08/04/19 1608 08/04/19 2024  GLUCAP 162* 216* 194* 185* 175*   Lipid Profile: No results for input(s): CHOL, HDL, LDLCALC, TRIG, CHOLHDL, LDLDIRECT in the last 72 hours. Thyroid Function Tests: No results for input(s): TSH, T4TOTAL, FREET4, T3FREE, THYROIDAB in the last 72 hours. Anemia Panel: No results for input(s): VITAMINB12, FOLATE, FERRITIN, TIBC, IRON, RETICCTPCT in the last 72 hours. Sepsis Labs: No results for input(s): PROCALCITON, LATICACIDVEN in the last 168 hours.  Recent Results (from the past 240 hour(s))  MRSA PCR Screening     Status: None   Collection Time: 08/03/19 11:10 AM   Specimen: Nasopharyngeal  Result Value Ref Range Status   MRSA by PCR NEGATIVE NEGATIVE Final    Comment:        The GeneXpert MRSA Assay  (FDA approved for NASAL specimens only), is one component of a comprehensive MRSA colonization surveillance program. It is not intended to diagnose MRSA infection nor to guide or monitor treatment for MRSA infections. Performed at [facility], [address], [place], [place] [address]          Radiology Studies: CT Angio Chest PE W/Cm &/Or Wo Cm  Result Date: 08/03/2019 CLINICAL DATA:  Recently diagnosed with esophageal cancer, aspiration EXAM: CT ANGIOGRAPHY CHEST WITH CONTRAST TECHNIQUE: Multidetector CT imaging of the chest was performed using the standard protocol during bolus administration of intravenous contrast. Multiplanar CT image reconstructions and MIPs were obtained to evaluate the vascular anatomy. CONTRAST:  123mL OMNIPAQUE IOHEXOL 350 MG/ML SOLN COMPARISON:  July 09, 2019 FINDINGS: Cardiovascular: There is a optimal opacification of the pulmonary arteries. There is no central,segmental,  or subsegmental filling defects within the pulmonary arteries. The heart is normal in size. No pericardial effusion or thickening. No evidence right heart strain. There is normal three-vessel brachiocephalic anatomy without proximal stenosis. Coronary artery calcifications. Scattered aortic atherosclerosis is noted. Mediastinum/Nodes: No hilar, mediastinal, or axillary adenopathy. The thyroid gland is unremarkable. There is a moderately dilated air and fluid-filled proximal to mid esophagus. There is diffuse wall thickening of the distal esophagus extending to the GE junction as on the prior exam. A right-sided PICC is seen with the tip at the superior cavoatrial junction. Lungs/Pleura: Tree-in-bud and patchy airspace opacities are seen at both lung bases. There is also areas of consolidation seen at the posterior bilateral lung bases. There is a small left pleural effusion again identified. Upper Abdomen: No acute abnormalities present in the visualized portions of the upper abdomen.  Musculoskeletal: There is a heterogeneous mottled appearance throughout the osseous structures as on the prior exam. Review of the MIP images confirms the above findings. IMPRESSION: 1. No central, segmental, or subsegmental pulmonary embolism. 2. Tree-in-bud and patchy airspace opacity seen predominantly within both lower lungs, likely due to aspiration/infectious etiology. 3. Small left pleural effusion 4. Mildly dilated air and fluid-filled mid to distal esophagus with diffuse wall thickening of the GE junction. 5.  Aortic Atherosclerosis (ICD10-I70.0). Electronically Signed   By: Prudencio Pair M.D.   On: 08/03/2019 02:22        Scheduled Meds: . ALPRAZolam  0.5 mg Per J Tube BID  . Chlorhexidine Gluconate Cloth  6 each Topical Daily  . enoxaparin (LOVENOX) injection  40 mg Subcutaneous Q24H  . famotidine  20 mg Per Tube QHS  . FLUoxetine  20 mg Per Tube Daily  . free water  250 mL Per Tube Q8H  . insulin aspart  0-9 Units Subcutaneous TID WC  . metoprolol tartrate  12.5 mg Per Tube BID  . pantoprazole sodium  40 mg Per Tube BID  . sodium chloride flush  3 mL Intravenous Q12H  . sodium chloride  2 g Per J Tube TID WC  . terazosin  10 mg Per J Tube Daily   Continuous Infusions: . sodium chloride    . ceFEPime (MAXIPIME) IV Stopped (08/04/19 1604)  . feeding supplement (OSMOLITE 1.5 CAL) 40 mL/hr at 08/04/19 0855     LOS: 1 day    Time spent:8mins    Kathie Dike, MD [facility]   If 7PM-7AM, please contact night-coverage [email or web address]  08/04/2019, 8:35 PM

## 2019-08-04 NOTE — Progress Notes (Signed)
OT Cancellation Note  Patient Details Name: Kurt Duran MRN: [id number] DOB: May 21, 1945   Cancelled Treatment:    Reason Eval/Treat Not Completed: Medical issues which prohibited therapy. Per chart review pt was transferred from floor to step-down last night. This indicates a change in medical status and per [facility] policy will require a new order for OT services when pt is medically appropriate. OT will sign off at this time, please re-consult if/when pt is appropriate for rehab services.    Guadelupe Sabin, OTR/L  [phone] 08/04/2019, 7:14 AM

## 2019-08-04 NOTE — Progress Notes (Signed)
Tele advised pt was sustained in the 140's. BP 84/55 HR 144 O2 90% 3L [place] and R 24. Rapid was called due to HR sustained 140-150's and hypotension. CBG 162. Pt alert oriented, no report of pain, responding appropriately. EKG obtained. Tele confirmed afib vs irregular st. MD request to trans pt to step down and change IV  fluids (see mar). Report given bedside to [facility]. Pt spouse was notified of the course of events and new pt location.

## 2019-08-04 NOTE — Progress Notes (Signed)
PT Cancellation Note  Patient Details Name: Kurt Duran MRN: [id number] DOB: Sep 14, 1945   Cancelled Treatment:    Reason Eval/Treat Not Completed: Medical issues which prohibited therapy.  Patient transferred to a higher level of care and will need new PT consult resume therapy when patient is medically stable.  Thank you.   7:59 AM, 08/04/19 [name], MPT Physical Therapist with [facility] [phone] office [phone] mobile phone

## 2019-08-04 NOTE — Progress Notes (Signed)
Dr. Darrick Meigs paged and made aware that pts BP was 74/54. Awaiting orders Will continue to monitor pt

## 2019-08-05 DIAGNOSIS — L8990 Pressure ulcer of unspecified site, unspecified stage: Secondary | ICD-10-CM | POA: Insufficient documentation

## 2019-08-05 LAB — GLUCOSE, CAPILLARY
Glucose-Capillary: 231 mg/dL — ABNORMAL HIGH (ref 70–99)
Glucose-Capillary: 238 mg/dL — ABNORMAL HIGH (ref 70–99)
Glucose-Capillary: 219 mg/dL — ABNORMAL HIGH (ref 70–99)

## 2019-08-05 LAB — CBC
HCT: 25 % — ABNORMAL LOW (ref 39.0–52.0)
Hemoglobin: 8.1 g/dL — ABNORMAL LOW (ref 13.0–17.0)
MCH: 29.1 pg (ref 26.0–34.0)
MCHC: 32.4 g/dL (ref 30.0–36.0)
MCV: 89.9 fL (ref 80.0–100.0)
Platelets: 218 10*3/uL (ref 150–400)
RBC: 2.78 MIL/uL — ABNORMAL LOW (ref 4.22–5.81)
RDW: 14.8 % (ref 11.5–15.5)
WBC: 3.6 10*3/uL — ABNORMAL LOW (ref 4.0–10.5)
nRBC: 0 % (ref 0.0–0.2)

## 2019-08-05 LAB — COMPREHENSIVE METABOLIC PANEL
ALT: 87 U/L — ABNORMAL HIGH (ref 0–44)
AST: 70 U/L — ABNORMAL HIGH (ref 15–41)
Albumin: 1.7 g/dL — ABNORMAL LOW (ref 3.5–5.0)
Alkaline Phosphatase: 140 U/L — ABNORMAL HIGH (ref 38–126)
Anion gap: 10 (ref 5–15)
BUN: 12 mg/dL (ref 8–23)
CO2: 26 mmol/L (ref 22–32)
Calcium: 7.5 mg/dL — ABNORMAL LOW (ref 8.9–10.3)
Chloride: 97 mmol/L — ABNORMAL LOW (ref 98–111)
Creatinine, Ser: 0.32 mg/dL — ABNORMAL LOW (ref 0.61–1.24)
GFR calc Af Amer: >60 mL/min (ref >60)
GFR calc non Af Amer: >60 mL/min (ref >60)
Glucose, Bld: 244 mg/dL — ABNORMAL HIGH (ref 70–99)
Potassium: 3.1 mmol/L — ABNORMAL LOW (ref 3.5–5.1)
Sodium: 133 mmol/L — ABNORMAL LOW (ref 135–145)
Total Bilirubin: 0.2 mg/dL — ABNORMAL LOW (ref 0.3–1.2)
Total Protein: 5.1 g/dL — ABNORMAL LOW (ref 6.5–8.1)

## 2019-08-05 MED ORDER — METOPROLOL TARTRATE 25 MG PO TABS: 12.5000 mg | ORAL_TABLET | Freq: Two times a day (BID) | ORAL | Status: AC

## 2019-08-05 MED ORDER — INSULIN ASPART 100 UNIT/ML ~~LOC~~ SOLN
0.0000 [IU] | Freq: Four times a day (QID) | SUBCUTANEOUS | Status: DC
  Administered 2019-08-05 – 2019-08-06 (×4): 3 [IU] via SUBCUTANEOUS

## 2019-08-05 MED ORDER — GUAIFENESIN-DM 100-10 MG/5ML PO SYRP
5.0000 mL | ORAL_SOLUTION | ORAL | 0 refills | Status: AC | PRN
Start: 2019-08-05 — End: ?

## 2019-08-05 NOTE — Discharge Summary (Signed)
Physician Discharge Summary  Kurt Duran GXQ:[id number] DOB: Jun 06, 1945 DOA: 08/02/2019  PCP: Glenda Chroman, MD  Admit date: 08/02/2019 Discharge date: 08/05/2019  Admitted From: Skilled nursing facility Disposition: Skilled nursing facility  Recommendations for Outpatient Follow-up:  1. Follow up with PCP in 1-2 weeks 2. Please obtain BMP/CBC in one week 3. Continue IV antibiotics with ceftriaxone till 08/22/2019 4. Follow-up with oncology as previously scheduled   Discharge Condition: Stable CODE STATUS: DNR Diet recommendation: N.p.o., patient is on tube feeds  Brief/Interim Summary: 74 year old male with a history of esophageal cancer who is PEG tube dependent, with recent admission for bacteremia requiring long-term IV to biotics.  He was discharged to a nursing facility.  Patient returned to the hospital with what appears to be aspiration pneumonia.  Started on IV antibiotics.  Hospital course complicated by development of transient SVT which resolved after receiving IV metoprolol.  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 Acute respiratory failure with hypoxia secondary to aspiration pneumonia. Patient was requiring 4 to 5 L of oxygen.  He was started on intravenous cefepime.  Overall respiratory status has improved.  He is afebrile.  He will be discharged back on his chronic regimen of ceftriaxone. 2. SVT.  Patient developed SVT requiring transfer to stepdown unit.  Patient received intravenous metoprolol which improved his heart rate.  Blood pressure has been stabilized.  He was started on low-dose oral  Lopressor 3. Esophageal cancer. Continue follow-up with oncology for further chemotherapy/radiation. Patient is PEG tube dependent for feeding/medications. 4. GERD. Continue on PPI twice a day. 5. Type 2 diabetes. On oral hypoglycemics. Blood sugars have been stable.  Resume oral medication on discharge.  Discharge Instructions   Allergies as of 08/05/2019      Reactions   Eggs Or Egg-derived Products Nausea And Vomiting      Medication List    TAKE these medications   acetaminophen 325 MG tablet Commonly known as: TYLENOL Place 2 tablets (650 mg total) into feeding tube every 6 (six) hours as needed for mild pain (or Fever >/= 101).   albuterol 108 (90 Base) MCG/ACT inhaler Commonly known as: VENTOLIN HFA Inhale 2 puffs into the lungs every 4 (four) hours as needed for wheezing or shortness of breath.   albuterol (2.5 MG/3ML) 0.083% nebulizer solution Commonly known as: PROVENTIL Take 2.5 mg by nebulization every 6 (six) hours as needed for wheezing or shortness of breath.   ALPRAZolam 0.5 MG tablet Commonly known as: XANAX 1 tablet (0.5 mg total) by Per J Tube route 2 (two) times daily.   cefTRIAXone  IVPB Commonly known as: ROCEPHIN Inject 2 g into the vein daily. Indication:   bacteremia Last Day of Therapy:  08/22/19 Labs - Once weekly:  CBC/D and BMP, Labs - Every other week:  ESR and CRP What changed: additional instructions   famotidine 20 MG tablet Commonly known as: PEPCID Place 1 tablet (20 mg total) into feeding tube at bedtime.   feeding supplement (OSMOLITE 1.5 CAL) Liqd Place 1,000 mLs into feeding tube continuous.   feeding supplement (PRO-STAT SUGAR FREE 64) Liqd 30 mLs by Per J  Tube route 2 (two) times daily.   FLUoxetine 20 MG capsule Commonly known as: PROZAC Place 1 capsule (20 mg total) into feeding tube daily.   free water Soln Place 250 mLs into feeding tube every 8 (eight) hours.   glipiZIDE 10 MG tablet Commonly known as:  GLUCOTROL Place 0.5-1 tablets (5-10 mg total) into feeding tube 2 (two) times daily before a meal. Take 10 mg in the morning and 5 mg in the evening   guaiFENesin-dextromethorphan 100-10 MG/5ML syrup Commonly known as: ROBITUSSIN DM Take 5 mLs by mouth every 4 (four) hours as needed for cough.   lidocaine-prilocaine cream Commonly known as: EMLA Apply a pea-sized amount to port a cath site and cover with plastic wrap one hour prior to chemotherapy appointments What changed:   how much to take  how to take this  when to take this   metFORMIN 1000 MG tablet Commonly known as: GLUCOPHAGE 1 tablet (1,000 mg total) by Per J Tube route 2 (two) times daily.   metoprolol tartrate 25 MG tablet Commonly known as: LOPRESSOR Place 0.5 tablets (12.5 mg total) into feeding tube 2 (two) times daily.   ondansetron 4 MG disintegrating tablet Commonly known as: ZOFRAN-ODT 4 mg every 6 (six) hours as needed for nausea or vomiting (G-Tube).   PACLITAXEL IV Inject 50 mg/m2 into the vein once a week.   pantoprazole sodium 40 mg/20 mL Pack Commonly known as: PROTONIX Place 20 mLs (40 mg total) into feeding tube 2 (two) times daily.   prochlorperazine 10 MG tablet Commonly known as: COMPAZINE Take 1 tablet (10 mg total) by mouth every 6 (six) hours as needed (given per tube for Nausea or vomiting).   promethazine 25 MG suppository Commonly known as: PHENERGAN Place 25 mg rectally every 6 (six) hours as needed.   sodium chloride 1 g tablet 2 tablets (2 g total) by Per J Tube route 3 (three) times daily with meals.   terazosin 10 MG capsule Commonly known as: HYTRIN 1 capsule (10 mg total) by Per J Tube route daily.       Allergies  Allergen Reactions  . Eggs Or Egg-Derived Products Nausea And Vomiting    Consultations:     Procedures/Studies: DG Abd 1 View  Result Date: 07/13/2019 CLINICAL DATA:  Abdominal pain, pain around PEG tube EXAM: ABDOMEN - 1 VIEW COMPARISON:  CT  abdomen pelvis, 07/09/2019 FINDINGS: The bowel gas pattern is normal. No large burden of stool. Left hemiabdomen percutaneous jejunostomy tube. No radio-opaque calculi or other significant radiographic abnormality are seen. IMPRESSION: Nonobstructive pattern of bowel gas. No large burden of stool. No free air on supine radiographs. Left hemiabdomen percutaneous jejunostomy. Electronically Signed   By: Eddie Candle M.D.   On: 07/13/2019 15:20   CT Chest W Contrast  Result Date: 07/09/2019 CLINICAL DATA:  Fever and history of esophageal cancer.  Sepsis. EXAM: CT CHEST, ABDOMEN, AND PELVIS WITH CONTRAST TECHNIQUE: Multidetector CT imaging of the chest, abdomen and pelvis was performed following the standard protocol during bolus administration of intravenous contrast. CONTRAST:  124m OMNIPAQUE IOHEXOL 300 MG/ML  SOLN COMPARISON:  05/09/2019 FINDINGS: CT CHEST FINDINGS Cardiovascular: Normal heart size. No pericardial effusion. Extensive coronary atherosclerotic calcification. Porta catheter with tip at the distal SVC. Mediastinum/Nodes: Lower esophageal mass with wall thickening and retain fluid superiorly. There is indistinct fat at the GE junction with periesophageal nodularity likely reflecting tumor. No noted perforation. Small bilateral thyroid nodules measuring 1 cm or less. No followup recommended (  ref: J Am Coll Radiol. 2015 Feb;12(2): 143-50). Lungs/Pleura: Small left pleural effusion without nodularity. Left lower lobe atelectasis. Musculoskeletal: Spondylosis. Subcutaneous lipoma partially covered posterior to the right shoulder. CT ABDOMEN PELVIS FINDINGS Hepatobiliary: Vague 4 mm low-density in the subcapsular high right lobe liver.Sludge or noncalcified gallstones. The gallbladder is full but there is no acute cholecystitis. Pancreas: Unremarkable. Spleen: Unremarkable. Adrenals/Urinary Tract: Small left adrenal myelolipoma. No hydronephrosis or stone. Unremarkable bladder. Stomach/Bowel: Fluid levels  reach the distal colon. No bowel wall thickening. Percutaneous jejunostomy tube in good position. Negative for bowel obstruction. Vascular/Lymphatic: No acute vascular abnormality. Diffuse atherosclerotic calcification. Mildly enlarged lymph nodes about the GE junction. Reproductive:No acute finding Other: No ascites or pneumoperitoneum. Musculoskeletal: Extensive spondylosis. No bone metastases by recent PET-CT. IMPRESSION: 1. No specific cause of sepsis. 2. There is liquid stool but no colitic wall thickening. 3. Small left pleural effusion with atelectasis. 4. Known obstructive lower esophageal mass. Subcentimeter low-density in the upper right liver, attention on follow-up. 5. Cholelithiasis. Electronically Signed   By: Monte Fantasia M.D.   On: 07/09/2019 10:27   CT Angio Chest PE W/Cm &/Or Wo Cm  Result Date: 08/03/2019 CLINICAL DATA:  Recently diagnosed with esophageal cancer, aspiration EXAM: CT ANGIOGRAPHY CHEST WITH CONTRAST TECHNIQUE: Multidetector CT imaging of the chest was performed using the standard protocol during bolus administration of intravenous contrast. Multiplanar CT image reconstructions and MIPs were obtained to evaluate the vascular anatomy. CONTRAST:  186m OMNIPAQUE IOHEXOL 350 MG/ML SOLN COMPARISON:  July 09, 2019 FINDINGS: Cardiovascular: There is a optimal opacification of the pulmonary arteries. There is no central,segmental, or subsegmental filling defects within the pulmonary arteries. The heart is normal in size. No pericardial effusion or thickening. No evidence right heart strain. There is normal three-vessel brachiocephalic anatomy without proximal stenosis. Coronary artery calcifications. Scattered aortic atherosclerosis is noted. Mediastinum/Nodes: No hilar, mediastinal, or axillary adenopathy. The thyroid gland is unremarkable. There is a moderately dilated air and fluid-filled proximal to mid esophagus. There is diffuse wall thickening of the distal esophagus extending  to the GE junction as on the prior exam. A right-sided PICC is seen with the tip at the superior cavoatrial junction. Lungs/Pleura: Tree-in-bud and patchy airspace opacities are seen at both lung bases. There is also areas of consolidation seen at the posterior bilateral lung bases. There is a small left pleural effusion again identified. Upper Abdomen: No acute abnormalities present in the visualized portions of the upper abdomen. Musculoskeletal: There is a heterogeneous mottled appearance throughout the osseous structures as on the prior exam. Review of the MIP images confirms the above findings. IMPRESSION: 1. No central, segmental, or subsegmental pulmonary embolism. 2. Tree-in-bud and patchy airspace opacity seen predominantly within both lower lungs, likely due to aspiration/infectious etiology. 3. Small left pleural effusion 4. Mildly dilated air and fluid-filled mid to distal esophagus with diffuse wall thickening of the GE junction. 5.  Aortic Atherosclerosis (ICD10-I70.0). Electronically Signed   By: BPrudencio PairM.D.   On: 08/03/2019 02:22   CT ABDOMEN PELVIS W CONTRAST  Result Date: 07/09/2019 CLINICAL DATA:  Fever and history of esophageal cancer.  Sepsis. EXAM: CT CHEST, ABDOMEN, AND PELVIS WITH CONTRAST TECHNIQUE: Multidetector CT imaging of the chest, abdomen and pelvis was performed following the standard protocol during bolus administration of intravenous contrast. CONTRAST:  1052mOMNIPAQUE IOHEXOL 300 MG/ML  SOLN COMPARISON:  05/09/2019 FINDINGS: CT CHEST FINDINGS Cardiovascular: Normal heart size. No pericardial effusion. Extensive coronary atherosclerotic calcification. Porta catheter with tip at the distal  SVC. Mediastinum/Nodes: Lower esophageal mass with wall thickening and retain fluid superiorly. There is indistinct fat at the GE junction with periesophageal nodularity likely reflecting tumor. No noted perforation. Small bilateral thyroid nodules measuring 1 cm or less. No followup  recommended (ref: J Am Coll Radiol. 2015 Feb;12(2): 143-50). Lungs/Pleura: Small left pleural effusion without nodularity. Left lower lobe atelectasis. Musculoskeletal: Spondylosis. Subcutaneous lipoma partially covered posterior to the right shoulder. CT ABDOMEN PELVIS FINDINGS Hepatobiliary: Vague 4 mm low-density in the subcapsular high right lobe liver.Sludge or noncalcified gallstones. The gallbladder is full but there is no acute cholecystitis. Pancreas: Unremarkable. Spleen: Unremarkable. Adrenals/Urinary Tract: Small left adrenal myelolipoma. No hydronephrosis or stone. Unremarkable bladder. Stomach/Bowel: Fluid levels reach the distal colon. No bowel wall thickening. Percutaneous jejunostomy tube in good position. Negative for bowel obstruction. Vascular/Lymphatic: No acute vascular abnormality. Diffuse atherosclerotic calcification. Mildly enlarged lymph nodes about the GE junction. Reproductive:No acute finding Other: No ascites or pneumoperitoneum. Musculoskeletal: Extensive spondylosis. No bone metastases by recent PET-CT. IMPRESSION: 1. No specific cause of sepsis. 2. There is liquid stool but no colitic wall thickening. 3. Small left pleural effusion with atelectasis. 4. Known obstructive lower esophageal mass. Subcentimeter low-density in the upper right liver, attention on follow-up. 5. Cholelithiasis. Electronically Signed   By: Monte Fantasia M.D.   On: 07/09/2019 10:27   DG Chest Portable 1 View  Result Date: 08/02/2019 CLINICAL DATA:  Shortness of breath EXAM: PORTABLE CHEST 1 VIEW COMPARISON:  07/13/2019 FINDINGS: Right upper extremity approach PICC line terminates in the lower SVC. The heart size and mediastinal contours are within normal limits. Both lungs are clear. The visualized skeletal structures are unremarkable. IMPRESSION: No active disease. Electronically Signed   By: Ulyses Jarred M.D.   On: 08/02/2019 19:57   DG Chest Port 1 View  Result Date: 07/13/2019 CLINICAL DATA:   Recent Port-A-Cath removal. EXAM: PORTABLE CHEST 1 VIEW COMPARISON:  July 09, 2019 FINDINGS: The right-sided venous Port-A-Cath seen on the prior study has been removed. Mild atelectasis and/or infiltrate is seen within the retrocardiac region of the left lung base. There is no evidence of a pleural effusion or pneumothorax. The heart size and mediastinal contours are within normal limits. A radiopaque fusion plate and screws are seen overlying the cervical spine. Radiopaque surgical pins are seen overlying the right humeral head. Multilevel degenerative changes seen throughout the thoracic spine. IMPRESSION: 1. Mild left basilar atelectasis and/or infiltrate. 2. Postoperative changes within the cervical spine. Electronically Signed   By: Virgina Norfolk M.D.   On: 07/13/2019 19:28   DG Chest Port 1 View  Result Date: 07/09/2019 CLINICAL DATA:  Fever.  History of esophageal carcinoma EXAM: PORTABLE CHEST 1 VIEW COMPARISON:  July 08, 2019 FINDINGS: Port-A-Cath tip is in the superior vena cava. No pneumothorax. There is a small left pleural effusion. Lungs elsewhere are clear. Heart size and pulmonary vascularity are normal. No appreciable mediastinal widening. There is aortic atherosclerosis. No evident adenopathy. Postoperative change noted in lower cervical spine. Postoperative change also noted in right shoulder. IMPRESSION: Small left pleural effusion. Lungs elsewhere clear. Stable cardiac silhouette. Aortic Atherosclerosis (ICD10-I70.0). Port-A-Cath tip in superior vena cava.  No pneumothorax. Electronically Signed   By: Lowella Grip III M.D.   On: 07/09/2019 09:05   DG Chest Port 1 View  Result Date: 07/08/2019 CLINICAL DATA:  Cough and shortness of breath EXAM: PORTABLE CHEST 1 VIEW COMPARISON:  None. FINDINGS: The heart size and mediastinal contours are within normal limits. Both lungs are  clear. The visualized skeletal structures are unremarkable. There is a right chest wall power-injectable  Port-A-Cath with tip in the lower SVC via a right internal jugular vein approach. IMPRESSION: No active disease. Electronically Signed   By: Ulyses Jarred M.D.   On: 07/08/2019 03:46   ECHOCARDIOGRAM LIMITED  Result Date: 07/12/2019    ECHOCARDIOGRAM LIMITED REPORT   Patient Name:   Kurt Duran Date of Exam: 07/12/2019 Medical Rec #:  [id number]       Height:       73.0 in Accession #:    [id number]      Weight:       206.1 lb Date of Birth:  12-17-1945       BSA:          2.179 m Patient Age:    26 years        BP:           129/89 mmHg Patient Gender: M               HR:           84 bpm. Exam Location:  [facility] Procedure: Limited Echo Indications:    Bacteremia 790.7 / R78.81  History:        Patient has prior history of Echocardiogram examinations, most                 recent 05/10/2019. COPD; Risk Factors:Diabetes and Former Smoker.                 GERD, Sepsis, Septic shock , Neutropenic fever.  Sonographer:    Leavy Cella RDCS (AE) Referring Phys: [place]  1. Left ventricular ejection fraction, by estimation, is 55 to 60%. The left ventricle has normal function. The left ventricle has no regional wall motion abnormalities.  2. Right ventricular systolic function is normal. The right ventricular size is normal. There is normal pulmonary artery systolic pressure.  3. Trivial mitral valve regurgitation. No evidence of mitral stenosis.  4. Aortic valve poorly visualized. In the short access heavy shadowing from anular calcification, leaflets poorly visualized. In some views there is at least some suggestion of a density moving independently from the leaflets, possibly complex calcification but cannot exlcude a vegetation. If clinical suspicion for endocarditis consider TEE. There is no significant aortic valve dysfunction. . The aortic valve is abnormal. Aortic valve regurgitation is not visualized. No aortic stenosis is present.  5. Indeterminant PASP, inadequate TR jet.  6. The  inferior vena cava is normal in size with greater than 50% respiratory variability, suggesting right atrial pressure of 3 mmHg. FINDINGS  Left Ventricle: Left ventricular ejection fraction, by estimation, is 55 to 60%. The left ventricle has normal function. The left ventricle has no regional wall motion abnormalities. Right Ventricle: The right ventricular size is normal. No increase in right ventricular wall thickness. Right ventricular systolic function is normal. There is normal pulmonary artery systolic pressure. The tricuspid regurgitant velocity is 1.91 m/s, and  with an assumed right atrial pressure of 10 mmHg, the estimated right ventricular systolic pressure is 63.8 mmHg. Mitral Valve: There is mild thickening of the mitral valve leaflet(s). There is mild calcification of the mitral valve leaflet(s). Mild mitral annular calcification. Trivial mitral valve regurgitation. No evidence of mitral valve stenosis. Tricuspid Valve: Tricuspid valve regurgitation is trivial. No evidence of tricuspid stenosis. Aortic Valve: Aortic valve poorly visualized. In the short access heavy shadowing from anular calcification, leaflets poorly  visualized. In some views there is at least some suggestion of a density moving independently from the leaflets, possibly complex  calcification but cannot exlcude a vegetation. If clinical suspicion for endocarditis consider TEE. There is no significant aortic valve dysfunction. The aortic valve is abnormal. . There is mild thickening and mild calcification of the aortic valve. Aortic valve regurgitation is not visualized. No aortic stenosis is present. Mild aortic valve annular calcification. There is mild thickening of the aortic valve. There is mild calcification of the aortic valve. Pulmonic Valve: The pulmonic valve was normal in structure. Pulmonic valve regurgitation is not visualized. No evidence of pulmonic stenosis. Pulmonary Artery: Indeterminant PASP, inadequate TR jet.  Venous: The inferior vena cava is normal in size with greater than 50% respiratory variability, suggesting right atrial pressure of 3 mmHg.  LEFT VENTRICLE PLAX 2D LVIDd:         4.45 cm LVIDs:         3.17 cm LV PW:         1.14 cm LV IVS:        0.86 cm LVOT diam:     2.20 cm LV SV:         75 LV SV Index:   34 LVOT Area:     3.80 cm  LEFT ATRIUM         Index LA diam:    3.40 cm 1.56 cm/m  AORTIC VALVE LVOT Vmax:   106.00 cm/s LVOT Vmean:  54.500 cm/s LVOT VTI:    0.197 m  AORTA Ao Root diam: 3.20 cm MITRAL VALVE                TRICUSPID VALVE MV Area (PHT): 3.40 cm     TR Peak grad:   14.6 mmHg MV Decel Time: 223 msec     TR Vmax:        191.00 cm/s MV E velocity: 105.00 cm/s MV A velocity: 72.40 cm/s   SHUNTS MV E/A ratio:  1.45         Systemic VTI:  0.20 m                             Systemic Diam: 2.20 cm Carlyle Dolly MD Electronically signed by Carlyle Dolly MD Signature Date/Time: 07/12/2019/11:22:45 AM    Final    [place] EKG SITE RITE  Result Date: 07/14/2019 If Site Rite image not attached, placement could not be confirmed due to current cardiac rhythm.      Subjective: Feeling better.  Continues to have productive cough.  Discharge Exam: Vitals:   08/05/19 1200 08/05/19 1440 08/05/19 1609 08/05/19 2041  BP: 123/68  126/68 128/76  Pulse: 83  85 88  Resp: (!) 35  18 18  Temp: 98.3 F (36.8 C)  98.5 F (36.9 C) (!) 97.4 F (36.3 C)  TempSrc: Oral  Oral   SpO2: 98% 96% 99% 99%  Weight:      Height:        General: Pt is alert, awake, not in acute distress Cardiovascular: RRR, S1/S2 +, no rubs, no gallops Respiratory: CTA bilaterally, no wheezing, no rhonchi Abdominal: Soft, NT, ND, bowel sounds +, PEG tube in place Extremities: no edema, no cyanosis    The results of significant diagnostics from this hospitalization (including imaging, microbiology, ancillary and laboratory) are listed below for reference.     Microbiology: Recent Results (from the past 240 hour(s))  MRSA PCR Screening     Status: None   Collection Time: 08/03/19 11:10 AM   Specimen: Nasopharyngeal  Result Value Ref Range Status   MRSA by PCR NEGATIVE NEGATIVE Final    Comment:        The GeneXpert MRSA Assay (FDA approved for NASAL specimens only), is one component of a comprehensive MRSA colonization surveillance program. It is not intended to diagnose MRSA infection nor to guide or monitor treatment for MRSA infections. Performed at [facility], [address], [place], [place] [address]      Labs: BNP (last 3 results) Recent Labs    07/08/19 0318  BNP 970.2*   Basic Metabolic Panel: Recent Labs  Lab 08/02/19 1851 08/03/19 0626 08/04/19 0413 08/05/19 0431  NA 129* 131* 134* 133*  K 4.2 3.9 3.5 3.1*  CL 93* 93* 101 97*  CO2 '23 24 26 26  ' GLUCOSE 303* 215* 212* 244*  BUN 26* '22 19 12  ' CREATININE 0.65 0.42* 0.40* 0.32*  CALCIUM 7.9* 8.2* 7.7* 7.5*   Liver Function Tests: Recent Labs  Lab 08/05/19 0431  AST 70*  ALT 87*  ALKPHOS 140*  BILITOT 0.2*  PROT 5.1*  ALBUMIN 1.7*   No results for input(s): LIPASE, AMYLASE in the last 168 hours. No results for input(s): AMMONIA in the last 168 hours. CBC: Recent Labs  Lab 08/02/19 1851 08/03/19 0626 08/04/19 0413 08/05/19 0431  WBC 5.3 4.0 2.9* 3.6*  NEUTROABS 4.3  --   --   --   HGB 9.0* 8.6* 8.1* 8.1*  HCT 26.5* 26.1* 24.8* 25.0*  MCV 88.3 88.5 90.8 89.9  PLT 219 202 215 218   Cardiac Enzymes: No results for input(s): CKTOTAL, CKMB, CKMBINDEX, TROPONINI in the last 168 hours. BNP: Invalid input(s): POCBNP CBG: Recent Labs  Lab 08/04/19 1608 08/04/19 2024 08/05/19 0749 08/05/19 1222 08/05/19 1628  GLUCAP 185* 175* 231* 238* 219*   D-Dimer No results for input(s): DDIMER in the last 72 hours. Hgb A1c No results for input(s): HGBA1C in the last 72 hours. Lipid Profile No results for input(s): CHOL, HDL, LDLCALC, TRIG, CHOLHDL, LDLDIRECT in the last 72 hours. Thyroid function  studies No results for input(s): TSH, T4TOTAL, T3FREE, THYROIDAB in the last 72 hours.  Invalid input(s): FREET3 Anemia work up No results for input(s): VITAMINB12, FOLATE, FERRITIN, TIBC, IRON, RETICCTPCT in the last 72 hours. Urinalysis    Component Value Date/Time   COLORURINE YELLOW 07/09/2019 0850   APPEARANCEUR CLEAR 07/09/2019 0850   LABSPEC 1.018 07/09/2019 0850   PHURINE 5.0 07/09/2019 0850   GLUCOSEU >=500 (A) 07/09/2019 0850   HGBUR NEGATIVE 07/09/2019 0850   BILIRUBINUR NEGATIVE 07/09/2019 0850   KETONESUR NEGATIVE 07/09/2019 0850   PROTEINUR NEGATIVE 07/09/2019 0850   NITRITE NEGATIVE 07/09/2019 0850   LEUKOCYTESUR NEGATIVE 07/09/2019 0850   Sepsis Labs Invalid input(s): PROCALCITONIN,  WBC,  LACTICIDVEN Microbiology Recent Results (from the past 240 hour(s))  MRSA PCR Screening     Status: None   Collection Time: 08/03/19 11:10 AM   Specimen: Nasopharyngeal  Result Value Ref Range Status   MRSA by PCR NEGATIVE NEGATIVE Final    Comment:        The GeneXpert MRSA Assay (FDA approved for NASAL specimens only), is one component of a comprehensive MRSA colonization surveillance program. It is not intended to diagnose MRSA infection nor to guide or monitor treatment for MRSA infections. Performed at [facility], [address], [place], [place] [address]  Time coordinating discharge: 42mns  SIGNED:   JKathie Dike MD  [facility] 08/05/2019, 9:02 PM   If 7PM-7AM, please contact night-coverage [email or web address]

## 2019-08-05 NOTE — TOC Progression Note (Signed)
Transition of Care [facility]) - Progression Note   Patient Details  Name: ALECZANDER MAHON MRN: [id number] Date of Birth: Oct 23, 1945  Transition of Care [facility]) CM/SW [place], LCSW Phone Number: 08/05/2019, 3:10 PM  Clinical Narrative: CSW called Mardene Celeste with [facility] to see if patient can return tomorrow if he is medically ready for discharge. [facility] does not accept patients on Sundays. Mardene Celeste reported patient can come tomorrow if the discharge summary is received by 10:30am and patient has a negative COVID test. Patient will go to [facility] and the number to call for report is [phone].  Expected Discharge Plan: Skilled Nursing Facility Barriers to Discharge: Continued Medical Work up  Expected Discharge Plan and Services Expected Discharge Plan: [place] Choice: [place] arrangements for the past 2 months: [place]  Readmission Risk Interventions Readmission Risk Prevention Plan 08/03/2019  Transportation Screening Complete  Medication Review Press [employer]) Complete  HRI or [place] Not Complete  HRI or Home Care Consult Pt Refusal Comments Pt from SNF  SW Recovery Care/Counseling Consult Complete  Palliative Care Screening Not [place] Complete  Some recent data might be hidden

## 2019-08-06 DIAGNOSIS — C159 Malignant neoplasm of esophagus, unspecified: Secondary | ICD-10-CM | POA: Diagnosis not present

## 2019-08-06 DIAGNOSIS — Z931 Gastrostomy status: Secondary | ICD-10-CM | POA: Diagnosis not present

## 2019-08-06 DIAGNOSIS — K219 Gastro-esophageal reflux disease without esophagitis: Secondary | ICD-10-CM | POA: Diagnosis not present

## 2019-08-06 DIAGNOSIS — J449 Chronic obstructive pulmonary disease, unspecified: Secondary | ICD-10-CM | POA: Diagnosis not present

## 2019-08-06 DIAGNOSIS — R41841 Cognitive communication deficit: Secondary | ICD-10-CM | POA: Diagnosis not present

## 2019-08-06 DIAGNOSIS — M6281 Muscle weakness (generalized): Secondary | ICD-10-CM | POA: Diagnosis not present

## 2019-08-06 DIAGNOSIS — J698 Pneumonitis due to inhalation of other solids and liquids: Secondary | ICD-10-CM | POA: Diagnosis not present

## 2019-08-06 DIAGNOSIS — C155 Malignant neoplasm of lower third of esophagus: Secondary | ICD-10-CM | POA: Diagnosis not present

## 2019-08-06 DIAGNOSIS — R2689 Other abnormalities of gait and mobility: Secondary | ICD-10-CM | POA: Diagnosis not present

## 2019-08-06 DIAGNOSIS — Z792 Long term (current) use of antibiotics: Secondary | ICD-10-CM | POA: Diagnosis not present

## 2019-08-06 DIAGNOSIS — E119 Type 2 diabetes mellitus without complications: Secondary | ICD-10-CM | POA: Diagnosis not present

## 2019-08-06 DIAGNOSIS — J690 Pneumonitis due to inhalation of food and vomit: Secondary | ICD-10-CM | POA: Diagnosis not present

## 2019-08-06 DIAGNOSIS — A419 Sepsis, unspecified organism: Secondary | ICD-10-CM | POA: Diagnosis not present

## 2019-08-06 DIAGNOSIS — Z95828 Presence of other vascular implants and grafts: Secondary | ICD-10-CM | POA: Diagnosis not present

## 2019-08-06 LAB — RESPIRATORY PANEL BY RT PCR (FLU A&B, COVID)
Influenza A by PCR: NEGATIVE
Influenza B by PCR: NEGATIVE
SARS Coronavirus 2 by RT PCR: NEGATIVE

## 2019-08-06 LAB — GLUCOSE, CAPILLARY
Glucose-Capillary: 207 mg/dL — ABNORMAL HIGH (ref 70–99)
Glucose-Capillary: 227 mg/dL — ABNORMAL HIGH (ref 70–99)
Glucose-Capillary: 234 mg/dL — ABNORMAL HIGH (ref 70–99)

## 2019-08-06 MED ORDER — POTASSIUM CHLORIDE 20 MEQ PO PACK
40.0000 meq | PACK | Freq: Once | ORAL | Status: AC
  Administered 2019-08-06: 40 meq
  Filled 2019-08-06: qty 2

## 2019-08-06 MED ORDER — POTASSIUM CHLORIDE 10 MEQ/100ML IV SOLN
10.0000 meq | INTRAVENOUS | Status: AC
  Administered 2019-08-06 (×2): 10 meq via INTRAVENOUS
  Filled 2019-08-06 (×2): qty 100

## 2019-08-06 NOTE — NC FL2 (Signed)
[place] MEDICAID FL2 LEVEL OF CARE SCREENING TOOL     IDENTIFICATION  Patient Name: Kurt Duran Birthdate: 08/09/1945 Sex: male Admission Date (Current Location): 08/02/2019  [facility] and [place] Number:  [employer] and Address:  [address], [place]      Provider Number: [id number]  Attending Physician Name and Address:  Kathie Dike, MD  Relative Name and Phone Number:       Current Level of Care: Hospital Recommended Level of Care: [place] Prior Approval Number:    Date Approved/Denied:   PASRR Number:    Discharge Plan: SNF    Current Diagnoses: Patient Active Problem List   Diagnosis Date Noted  . Pressure injury of skin 08/05/2019  . Aspiration pneumonia ([place]) 08/03/2019  . History of removal of Port-a-Cath   . Bacteremia   . Hematemesis without nausea   . Sepsis ([place]) 07/09/2019  . S/P percutaneous endoscopic gastrostomy (PEG) tube placement ([place]) 07/09/2019  . Neutropenic fever ([place]) 07/09/2019  . Hyponatremia 07/09/2019  . Hypomagnesemia 07/09/2019  . Pancytopenia ([place]) 07/09/2019  . Septic shock ([place]) 07/09/2019  . Dehydration 07/06/2019  . Port-A-Cath in place 06/07/2019  . Protein-calorie malnutrition, severe 05/13/2019  . 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 Esophageal mass-Lower 3rd   . COPD (chronic obstructive pulmonary disease) ([place]) 05/09/2019  . DM2 (diabetes mellitus, type 2) ([place]) 05/09/2019  . GERD (gastroesophageal reflux disease) 05/09/2019  . Intractable vomiting 05/09/2019  . HTN (hypertension) 05/09/2019  . Gout 05/09/2019  . Intractable nausea and vomiting 05/09/2019    Orientation RESPIRATION BLADDER Height & Weight     Self, Time, Situation, Place    Continent Weight: 188 lb 7.9 oz (85.5 kg) Height:  6\' 1"  (185.4 cm)  BEHAVIORAL SYMPTOMS/MOOD NEUROLOGICAL BOWEL NUTRITION STATUS      Continent Feeding tube  AMBULATORY STATUS  COMMUNICATION OF NEEDS Skin   Limited Assist Verbally Normal                       Personal Care Assistance Level of Assistance    Bathing Assistance: Limited assistance Feeding assistance: Independent Dressing Assistance: Limited assistance     Functional Limitations Info    Sight Info: Adequate Hearing Info: Adequate Speech Info: Adequate    SPECIAL CARE FACTORS FREQUENCY  PT (By licensed PT), OT (By licensed OT)     PT Frequency: 5x week OT Frequency: 3x week            Contractures Contractures Info: Not present    Additional Factors Info    Code Status Info: DNR Allergies Info: Eggs or Egg-derived products Psychotropic Info: Xanax         Current Medications (08/06/2019):  This is the current hospital active medication list Current Facility-Administered Medications  Medication Dose Route Frequency Provider Last Rate Last Admin  . 0.9 %  sodium chloride infusion  250 mL Intravenous PRN Lynetta Mare, MD 10 mL/hr at 08/06/19 0314 Rate Verify at 08/06/19 0314  . albuterol (PROVENTIL) (2.5 MG/3ML) 0.083% nebulizer solution 3 mL  3 mL Inhalation Q4H PRN Lynetta Mare, MD      . ALPRAZolam Duanne Moron) tablet 0.5 mg  0.5 mg Per J Tube BID Lynetta Mare, MD   0.5 mg at 08/05/19 2209  . bisacodyl (DULCOLAX) suppository 10 mg  10 mg Rectal Daily PRN Lynetta Mare, MD      . ceFEPIme (MAXIPIME) 2 g  in sodium chloride 0.9 % 100 mL IVPB  2 g Intravenous Q8H Erenest Blank, RPH 200 mL/hr at 08/06/19 0608 2 g at 08/06/19 [id number]  . Chlorhexidine Gluconate Cloth 2 % PADS 6 each  6 each Topical Daily Kathie Dike, MD   6 each at 08/05/19 0950  . enoxaparin (LOVENOX) injection 40 mg  40 mg Subcutaneous Q24H Lynetta Mare, MD   40 mg at 08/06/19 [id number]  . famotidine (PEPCID) tablet 20 mg  20 mg Per Tube QHS Lynetta Mare, MD   20 mg at 08/05/19 2209  . feeding supplement (OSMOLITE 1.5 CAL) liquid 1,000 mL  1,000 mL Per Tube Continuous Kathie Dike, MD 40  mL/hr at 08/05/19 0440 1,000 mL at 08/05/19 0440  . FLUoxetine (PROZAC) capsule 20 mg  20 mg Per Tube Daily Lynetta Mare, MD   20 mg at 08/05/19 0950  . free water 250 mL  250 mL Per Tube Q8H Kathie Dike, MD   250 mL at 08/06/19 0610  . insulin aspart (novoLOG) injection 0-9 Units  0-9 Units Subcutaneous Q6H Kathie Dike, MD   3 Units at 08/06/19 0609  . metoprolol tartrate (LOPRESSOR) tablet 12.5 mg  12.5 mg Per Tube BID Kathie Dike, MD   12.5 mg at 08/05/19 2209  . ondansetron (ZOFRAN) tablet 4 mg  4 mg Oral Q6H PRN Lynetta Mare, MD       Or  . ondansetron [facility]) injection 4 mg  4 mg Intravenous Q6H PRN Lynetta Mare, MD      . pantoprazole sodium (PROTONIX) 40 mg/20 mL oral suspension 40 mg  40 mg Per Tube BID Kathie Dike, MD   40 mg at 08/05/19 2209  . polyethylene glycol (MIRALAX / GLYCOLAX) packet 17 g  17 g Oral Daily PRN Lynetta Mare, MD      . potassium chloride (KLOR-CON) packet 40 mEq  40 mEq Per Tube Once Kathie Dike, MD      . potassium chloride 10 mEq in 100 mL IVPB  10 mEq Intravenous Q1 Hr x 2 Memon, Jehanzeb, MD      . sodium chloride flush (NS) 0.9 % injection 3 mL  3 mL Intravenous Q12H Lynetta Mare, MD   3 mL at 08/04/19 2115  . sodium chloride flush (NS) 0.9 % injection 3 mL  3 mL Intravenous PRN Lynetta Mare, MD      . sodium chloride tablet 2 g  2 g Per J Tube TID WC Lynetta Mare, MD   2 g at 08/05/19 1741  . terazosin (HYTRIN) capsule 10 mg  10 mg Per J Tube Daily Lynetta Mare, MD   10 mg at 08/05/19 [id number]     Discharge Medications: Please see discharge summary for a list of discharge medications.  Relevant Imaging Results:  Relevant Lab Results:   Additional Information SSN: [id number], continue IV Anbx  Shade Flood, LCSW

## 2019-08-06 NOTE — TOC Transition Note (Addendum)
Transition of Care [facility]) - CM/SW Discharge Note   Patient Details  Name: Kurt Duran MRN: [id number] Date of Birth: 04/28/1945  Transition [facility]) CM/SW Contact:  Shade Flood, LCSW Phone Number: 08/06/2019, 11:06 AM   Clinical Narrative:     Pt stable for dc back to [facility] today per MD. DC summary and COVID results faxed to [facility] at 9:00 this AM. DC clinical sent electronically as well.   Updated RN and she will call report to [phone]. EMS form printed to the floor. RN to call EMS when pt ready. Pt's wife in the room and being kept updated by RN.  There are no other TOC needs for dc.  Final next level of care: Skilled Nursing Facility Barriers to Discharge: Barriers Resolved   Patient Goals and CMS Choice        Discharge Placement                       Discharge Plan and Services     Post Acute Care Choice: Skilled Nursing Facility                               Social Determinants of Health (SDOH) Interventions     Readmission Risk Interventions Readmission Risk Prevention Plan 08/03/2019  Transportation Screening Complete  Medication Review ([place]) Complete  HRI or [place] Not Complete  HRI or Home Care Consult Pt Refusal Comments Pt from SNF  SW Recovery Care/Counseling Consult Complete  Palliative Care Screening Not Applicable  Skilled Nursing Facility Complete  Some recent data might be hidden

## 2019-08-06 NOTE — Progress Notes (Signed)
Patient seen and examined.  No change in overall condition since last night.  Vitals reviewed and are stable.  He has been weaned off of oxygen.  Shortness of breath has improved.  Continues to have some cough.  Feels generally weak.  Lungs are clear to auscultation bilaterally.  Heart sounds are normal.  Assessment/plan: For further details please refer to discharge summary done on 4/30.  Plan is for discharge to nursing facility today.  1. Acute respiratory failure with hypoxia secondary to aspiration pneumonia. Patient was requiring 4 to 5 L of oxygen.  He was started on intravenous cefepime.  Overall respiratory status has improved and he has been weaned off oxygen.  He is afebrile.  He will be discharged back on his chronic regimen of ceftriaxone. 2. SVT. Patient developed SVT requiring transfer to stepdown unit. Patient received intravenous metoprolol which improved his heart rate. Blood pressure has been stabilized.  He was started on low-dose oral Lopressor.  Follow-up heart rates have been stable. 3. Esophageal cancer. Continue follow-up with oncology for further chemotherapy/radiation. Patient is PEG tube dependent for feeding/medications. 4. GERD. Continue on PPI twice a day. 5. Type 2 diabetes. On oral hypoglycemics. Blood sugars have been stable.  Resume oral medication on discharge. 6. Hypokalemia.  This was replaced.  [employer]

## 2019-08-06 NOTE — Plan of Care (Signed)
  Problem: Education: Goal: Knowledge of General Education information will improve Description Including pain rating scale, medication(s)/side effects and non-pharmacologic comfort measures Outcome: Progressing   Problem: Health Behavior/Discharge Planning: Goal: Ability to manage health-related needs will improve Outcome: Progressing   

## 2019-08-06 NOTE — Progress Notes (Signed)
Nsg Discharge Note  Admit Date:  08/02/2019 Discharge date: 08/06/2019   Chrissie Noa to be D/C'd Rehab per MD order.  AVS completed.  Copy for chart, and copy for patient signed, and dated. Called report to [facility]. [facility] picked up stable patient to transport to [facility]. Patient/caregiver able to verbalize understanding.  Discharge Medication: Allergies as of 08/06/2019      Reactions   Eggs Or Egg-derived Products Nausea And Vomiting      Medication List    TAKE these medications   acetaminophen 325 MG tablet Commonly known as: TYLENOL Place 2 tablets (650 mg total) into feeding tube every 6 (six) hours as needed for mild pain (or Fever >/= 101).   albuterol 108 (90 Base) MCG/ACT inhaler Commonly known as: VENTOLIN HFA Inhale 2 puffs into the lungs every 4 (four) hours as needed for wheezing or shortness of breath.   albuterol (2.5 MG/3ML) 0.083% nebulizer solution Commonly known as: PROVENTIL Take 2.5 mg by nebulization every 6 (six) hours as needed for wheezing or shortness of breath.   ALPRAZolam 0.5 MG tablet Commonly known as: XANAX 1 tablet (0.5 mg total) by Per J Tube route 2 (two) times daily.   cefTRIAXone  IVPB Commonly known as: ROCEPHIN Inject 2 g into the vein daily. Indication:   bacteremia Last Day of Therapy:  08/22/19 Labs - Once weekly:  CBC/D and BMP, Labs - Every other week:  ESR and CRP What changed: additional instructions   famotidine 20 MG tablet Commonly known as: PEPCID Place 1 tablet (20 mg total) into feeding tube at bedtime.   feeding supplement (OSMOLITE 1.5 CAL) Liqd Place 1,000 mLs into feeding tube continuous.   feeding supplement (PRO-STAT SUGAR FREE 64) Liqd 30 mLs by Per J Tube route 2 (two) times daily.   FLUoxetine 20 MG capsule Commonly known as: PROZAC Place 1 capsule (20 mg total) into feeding tube daily.   free water Soln Place 250 mLs into feeding tube every 8 (eight) hours.   glipiZIDE 10 MG tablet Commonly known as:  GLUCOTROL Place 0.5-1 tablets (5-10 mg total) into feeding tube 2 (two) times daily before a meal. Take 10 mg in the morning and 5 mg in the evening   guaiFENesin-dextromethorphan 100-10 MG/5ML syrup Commonly known as: ROBITUSSIN DM Take 5 mLs by mouth every 4 (four) hours as needed for cough.   lidocaine-prilocaine cream Commonly known as: EMLA Apply a pea-sized amount to port a cath site and cover with plastic wrap one hour prior to chemotherapy appointments What changed:   how much to take  how to take this  when to take this   metFORMIN 1000 MG tablet Commonly known as: GLUCOPHAGE 1 tablet (1,000 mg total) by Per J Tube route 2 (two) times daily.   metoprolol tartrate 25 MG tablet Commonly known as: LOPRESSOR Place 0.5 tablets (12.5 mg total) into feeding tube 2 (two) times daily.   ondansetron 4 MG disintegrating tablet Commonly known as: ZOFRAN-ODT 4 mg every 6 (six) hours as needed for nausea or vomiting (G-Tube).   PACLITAXEL IV Inject 50 mg/m2 into the vein once a week.   pantoprazole sodium 40 mg/20 mL Pack Commonly known as: PROTONIX Place 20 mLs (40 mg total) into feeding tube 2 (two) times daily.   prochlorperazine 10 MG tablet Commonly known as: COMPAZINE Take 1 tablet (10 mg total) by mouth every 6 (six) hours as needed (given per tube for Nausea or vomiting).   promethazine 25 MG suppository  Commonly known as: PHENERGAN Place 25 mg rectally every 6 (six) hours as needed.   sodium chloride 1 g tablet 2 tablets (2 g total) by Per J Tube route 3 (three) times daily with meals.   terazosin 10 MG capsule Commonly known as: HYTRIN 1 capsule (10 mg total) by Per J Tube route daily.       Discharge Assessment: Vitals:   08/06/19 0448 08/06/19 1418  BP: (!) 119/93 109/68  Pulse: 83 88  Resp: 17 18  Temp: 97.7 F (36.5 C)   SpO2: 98% 97%   Skin clean, dry and intact without evidence of skin break down, no evidence of skin tears noted. IV catheter  discontinued intact. Site without signs and symptoms of complications - no redness or edema noted at insertion site, patient denies c/o pain - only slight tenderness at site.  Dressing with slight pressure applied.  D/c Instructions-Education: Discharge instructions given to patient/family with verbalized understanding. D/c education completed with patient/family including follow up instructions, medication list, d/c activities limitations if indicated, with other d/c instructions as indicated by MD - patient able to verbalize understanding, all questions fully answered. Patient instructed to return to ED, call 911, or call MD for any changes in condition.  Patient escorted via [place], and D/C home via private auto.  Santa Lighter, RN 08/06/2019 7:30 PM

## 2019-08-06 NOTE — Plan of Care (Signed)
  Problem: Education: Goal: Knowledge of General Education information will improve Description: Including pain rating scale, medication(s)/side effects and non-pharmacologic comfort measures 08/06/2019 1533 by Santa Lighter, RN Outcome: Adequate for Discharge 08/06/2019 1532 by Santa Lighter, RN Outcome: Progressing   Problem: Clinical Measurements: Goal: Ability to maintain clinical measurements within normal limits will improve Outcome: Adequate for Discharge Goal: Will remain free from infection Outcome: Adequate for Discharge Goal: Diagnostic test results will improve Outcome: Adequate for Discharge Goal: Respiratory complications will improve Outcome: Adequate for Discharge Goal: Cardiovascular complication will be avoided Outcome: Adequate for Discharge   Problem: Activity: Goal: Risk for activity intolerance will decrease Outcome: Adequate for Discharge   Problem: Nutrition: Goal: Adequate nutrition will be maintained Outcome: Adequate for Discharge   Problem: Coping: Goal: Level of anxiety will decrease Outcome: Adequate for Discharge   Problem: Elimination: Goal: Will not experience complications related to bowel motility Outcome: Adequate for Discharge Goal: Will not experience complications related to urinary retention Outcome: Adequate for Discharge   Problem: Pain Managment: Goal: General experience of comfort will improve Outcome: Adequate for Discharge   Problem: Safety: Goal: Ability to remain free from injury will improve Outcome: Adequate for Discharge   Problem: Skin Integrity: Goal: Risk for impaired skin integrity will decrease Outcome: Adequate for Discharge

## 2019-08-08 DIAGNOSIS — K219 Gastro-esophageal reflux disease without esophagitis: Secondary | ICD-10-CM | POA: Diagnosis not present

## 2019-08-08 DIAGNOSIS — J449 Chronic obstructive pulmonary disease, unspecified: Secondary | ICD-10-CM | POA: Diagnosis not present

## 2019-08-08 DIAGNOSIS — C159 Malignant neoplasm of esophagus, unspecified: Secondary | ICD-10-CM | POA: Diagnosis not present

## 2019-08-08 DIAGNOSIS — J698 Pneumonitis due to inhalation of other solids and liquids: Secondary | ICD-10-CM | POA: Diagnosis not present

## 2019-08-08 LAB — GLUCOSE, CAPILLARY: Glucose-Capillary: 227 mg/dL — ABNORMAL HIGH (ref 70–99)

## 2019-09-07 DIAGNOSIS — E119 Type 2 diabetes mellitus without complications: Secondary | ICD-10-CM | POA: Diagnosis not present

## 2019-09-07 DIAGNOSIS — Z7401 Bed confinement status: Secondary | ICD-10-CM | POA: Diagnosis not present

## 2019-09-07 DIAGNOSIS — Z931 Gastrostomy status: Secondary | ICD-10-CM | POA: Diagnosis not present

## 2019-09-07 DIAGNOSIS — R1310 Dysphagia, unspecified: Secondary | ICD-10-CM | POA: Diagnosis not present

## 2019-09-07 DIAGNOSIS — C159 Malignant neoplasm of esophagus, unspecified: Secondary | ICD-10-CM | POA: Diagnosis not present

## 2019-09-07 DIAGNOSIS — K2100 Gastro-esophageal reflux disease with esophagitis, without bleeding: Secondary | ICD-10-CM | POA: Diagnosis not present

## 2019-09-07 DIAGNOSIS — I10 Essential (primary) hypertension: Secondary | ICD-10-CM | POA: Diagnosis not present

## 2019-09-07 DIAGNOSIS — M109 Gout, unspecified: Secondary | ICD-10-CM | POA: Diagnosis not present

## 2019-09-07 DIAGNOSIS — R42 Dizziness and giddiness: Secondary | ICD-10-CM | POA: Diagnosis not present

## 2019-09-07 DIAGNOSIS — M1990 Unspecified osteoarthritis, unspecified site: Secondary | ICD-10-CM | POA: Diagnosis not present

## 2019-09-07 DIAGNOSIS — R5381 Other malaise: Secondary | ICD-10-CM | POA: Diagnosis not present

## 2019-09-07 DIAGNOSIS — R110 Nausea: Secondary | ICD-10-CM | POA: Diagnosis not present

## 2019-09-07 DIAGNOSIS — R69 Illness, unspecified: Secondary | ICD-10-CM | POA: Diagnosis not present

## 2019-09-07 DIAGNOSIS — J449 Chronic obstructive pulmonary disease, unspecified: Secondary | ICD-10-CM | POA: Diagnosis not present

## 2019-09-07 DIAGNOSIS — R531 Weakness: Secondary | ICD-10-CM | POA: Diagnosis not present

## 2019-09-08 DIAGNOSIS — E119 Type 2 diabetes mellitus without complications: Secondary | ICD-10-CM | POA: Diagnosis not present

## 2019-09-08 DIAGNOSIS — Z931 Gastrostomy status: Secondary | ICD-10-CM | POA: Diagnosis not present

## 2019-09-08 DIAGNOSIS — I10 Essential (primary) hypertension: Secondary | ICD-10-CM | POA: Diagnosis not present

## 2019-09-08 DIAGNOSIS — K2100 Gastro-esophageal reflux disease with esophagitis, without bleeding: Secondary | ICD-10-CM | POA: Diagnosis not present

## 2019-09-08 DIAGNOSIS — C159 Malignant neoplasm of esophagus, unspecified: Secondary | ICD-10-CM | POA: Diagnosis not present

## 2019-09-08 DIAGNOSIS — J449 Chronic obstructive pulmonary disease, unspecified: Secondary | ICD-10-CM | POA: Diagnosis not present

## 2019-09-09 DIAGNOSIS — C159 Malignant neoplasm of esophagus, unspecified: Secondary | ICD-10-CM | POA: Diagnosis not present

## 2019-09-09 DIAGNOSIS — E119 Type 2 diabetes mellitus without complications: Secondary | ICD-10-CM | POA: Diagnosis not present

## 2019-09-09 DIAGNOSIS — Z931 Gastrostomy status: Secondary | ICD-10-CM | POA: Diagnosis not present

## 2019-09-09 DIAGNOSIS — I10 Essential (primary) hypertension: Secondary | ICD-10-CM | POA: Diagnosis not present

## 2019-09-09 DIAGNOSIS — K2100 Gastro-esophageal reflux disease with esophagitis, without bleeding: Secondary | ICD-10-CM | POA: Diagnosis not present

## 2019-09-09 DIAGNOSIS — J449 Chronic obstructive pulmonary disease, unspecified: Secondary | ICD-10-CM | POA: Diagnosis not present

## 2019-09-12 DIAGNOSIS — C159 Malignant neoplasm of esophagus, unspecified: Secondary | ICD-10-CM | POA: Diagnosis not present

## 2019-09-12 DIAGNOSIS — J449 Chronic obstructive pulmonary disease, unspecified: Secondary | ICD-10-CM | POA: Diagnosis not present

## 2019-09-12 DIAGNOSIS — K2100 Gastro-esophageal reflux disease with esophagitis, without bleeding: Secondary | ICD-10-CM | POA: Diagnosis not present

## 2019-09-12 DIAGNOSIS — I10 Essential (primary) hypertension: Secondary | ICD-10-CM | POA: Diagnosis not present

## 2019-09-12 DIAGNOSIS — Z931 Gastrostomy status: Secondary | ICD-10-CM | POA: Diagnosis not present

## 2019-09-12 DIAGNOSIS — E119 Type 2 diabetes mellitus without complications: Secondary | ICD-10-CM | POA: Diagnosis not present

## 2019-09-13 DIAGNOSIS — I10 Essential (primary) hypertension: Secondary | ICD-10-CM | POA: Diagnosis not present

## 2019-09-13 DIAGNOSIS — E119 Type 2 diabetes mellitus without complications: Secondary | ICD-10-CM | POA: Diagnosis not present

## 2019-09-13 DIAGNOSIS — C159 Malignant neoplasm of esophagus, unspecified: Secondary | ICD-10-CM | POA: Diagnosis not present

## 2019-09-13 DIAGNOSIS — J449 Chronic obstructive pulmonary disease, unspecified: Secondary | ICD-10-CM | POA: Diagnosis not present

## 2019-09-13 DIAGNOSIS — K2100 Gastro-esophageal reflux disease with esophagitis, without bleeding: Secondary | ICD-10-CM | POA: Diagnosis not present

## 2019-09-13 DIAGNOSIS — Z931 Gastrostomy status: Secondary | ICD-10-CM | POA: Diagnosis not present

## 2019-09-14 DIAGNOSIS — J449 Chronic obstructive pulmonary disease, unspecified: Secondary | ICD-10-CM | POA: Diagnosis not present

## 2019-09-14 DIAGNOSIS — I10 Essential (primary) hypertension: Secondary | ICD-10-CM | POA: Diagnosis not present

## 2019-09-14 DIAGNOSIS — E119 Type 2 diabetes mellitus without complications: Secondary | ICD-10-CM | POA: Diagnosis not present

## 2019-09-14 DIAGNOSIS — K2100 Gastro-esophageal reflux disease with esophagitis, without bleeding: Secondary | ICD-10-CM | POA: Diagnosis not present

## 2019-09-14 DIAGNOSIS — Z931 Gastrostomy status: Secondary | ICD-10-CM | POA: Diagnosis not present

## 2019-09-14 DIAGNOSIS — C159 Malignant neoplasm of esophagus, unspecified: Secondary | ICD-10-CM | POA: Diagnosis not present

## 2019-09-15 DIAGNOSIS — Z931 Gastrostomy status: Secondary | ICD-10-CM | POA: Diagnosis not present

## 2019-09-15 DIAGNOSIS — E119 Type 2 diabetes mellitus without complications: Secondary | ICD-10-CM | POA: Diagnosis not present

## 2019-09-15 DIAGNOSIS — C159 Malignant neoplasm of esophagus, unspecified: Secondary | ICD-10-CM | POA: Diagnosis not present

## 2019-09-15 DIAGNOSIS — J449 Chronic obstructive pulmonary disease, unspecified: Secondary | ICD-10-CM | POA: Diagnosis not present

## 2019-09-15 DIAGNOSIS — I10 Essential (primary) hypertension: Secondary | ICD-10-CM | POA: Diagnosis not present

## 2019-09-15 DIAGNOSIS — K2100 Gastro-esophageal reflux disease with esophagitis, without bleeding: Secondary | ICD-10-CM | POA: Diagnosis not present

## 2019-09-16 DIAGNOSIS — C159 Malignant neoplasm of esophagus, unspecified: Secondary | ICD-10-CM | POA: Diagnosis not present

## 2019-09-16 DIAGNOSIS — J449 Chronic obstructive pulmonary disease, unspecified: Secondary | ICD-10-CM | POA: Diagnosis not present

## 2019-09-16 DIAGNOSIS — Z931 Gastrostomy status: Secondary | ICD-10-CM | POA: Diagnosis not present

## 2019-09-16 DIAGNOSIS — K2100 Gastro-esophageal reflux disease with esophagitis, without bleeding: Secondary | ICD-10-CM | POA: Diagnosis not present

## 2019-09-16 DIAGNOSIS — I10 Essential (primary) hypertension: Secondary | ICD-10-CM | POA: Diagnosis not present

## 2019-09-16 DIAGNOSIS — E119 Type 2 diabetes mellitus without complications: Secondary | ICD-10-CM | POA: Diagnosis not present

## 2019-09-19 DIAGNOSIS — Z931 Gastrostomy status: Secondary | ICD-10-CM | POA: Diagnosis not present

## 2019-09-19 DIAGNOSIS — E119 Type 2 diabetes mellitus without complications: Secondary | ICD-10-CM | POA: Diagnosis not present

## 2019-09-19 DIAGNOSIS — K2100 Gastro-esophageal reflux disease with esophagitis, without bleeding: Secondary | ICD-10-CM | POA: Diagnosis not present

## 2019-09-19 DIAGNOSIS — I10 Essential (primary) hypertension: Secondary | ICD-10-CM | POA: Diagnosis not present

## 2019-09-19 DIAGNOSIS — C159 Malignant neoplasm of esophagus, unspecified: Secondary | ICD-10-CM | POA: Diagnosis not present

## 2019-09-19 DIAGNOSIS — J449 Chronic obstructive pulmonary disease, unspecified: Secondary | ICD-10-CM | POA: Diagnosis not present

## 2019-09-20 DIAGNOSIS — I10 Essential (primary) hypertension: Secondary | ICD-10-CM | POA: Diagnosis not present

## 2019-09-20 DIAGNOSIS — C159 Malignant neoplasm of esophagus, unspecified: Secondary | ICD-10-CM | POA: Diagnosis not present

## 2019-09-20 DIAGNOSIS — E119 Type 2 diabetes mellitus without complications: Secondary | ICD-10-CM | POA: Diagnosis not present

## 2019-09-20 DIAGNOSIS — K2100 Gastro-esophageal reflux disease with esophagitis, without bleeding: Secondary | ICD-10-CM | POA: Diagnosis not present

## 2019-09-20 DIAGNOSIS — J449 Chronic obstructive pulmonary disease, unspecified: Secondary | ICD-10-CM | POA: Diagnosis not present

## 2019-09-20 DIAGNOSIS — Z931 Gastrostomy status: Secondary | ICD-10-CM | POA: Diagnosis not present

## 2019-10-06 DEATH — deceased

## 2021-02-03 IMAGING — CR DG CHEST 1V PORT
1 series · 1 of 1 positions shown · non-contrast
Comparison: Single-view of the chest 12/17/2017.

CLINICAL DATA: Status post Port-A-Cath placement today.

EXAM:
PORTABLE CHEST 1 VIEW

[portable]
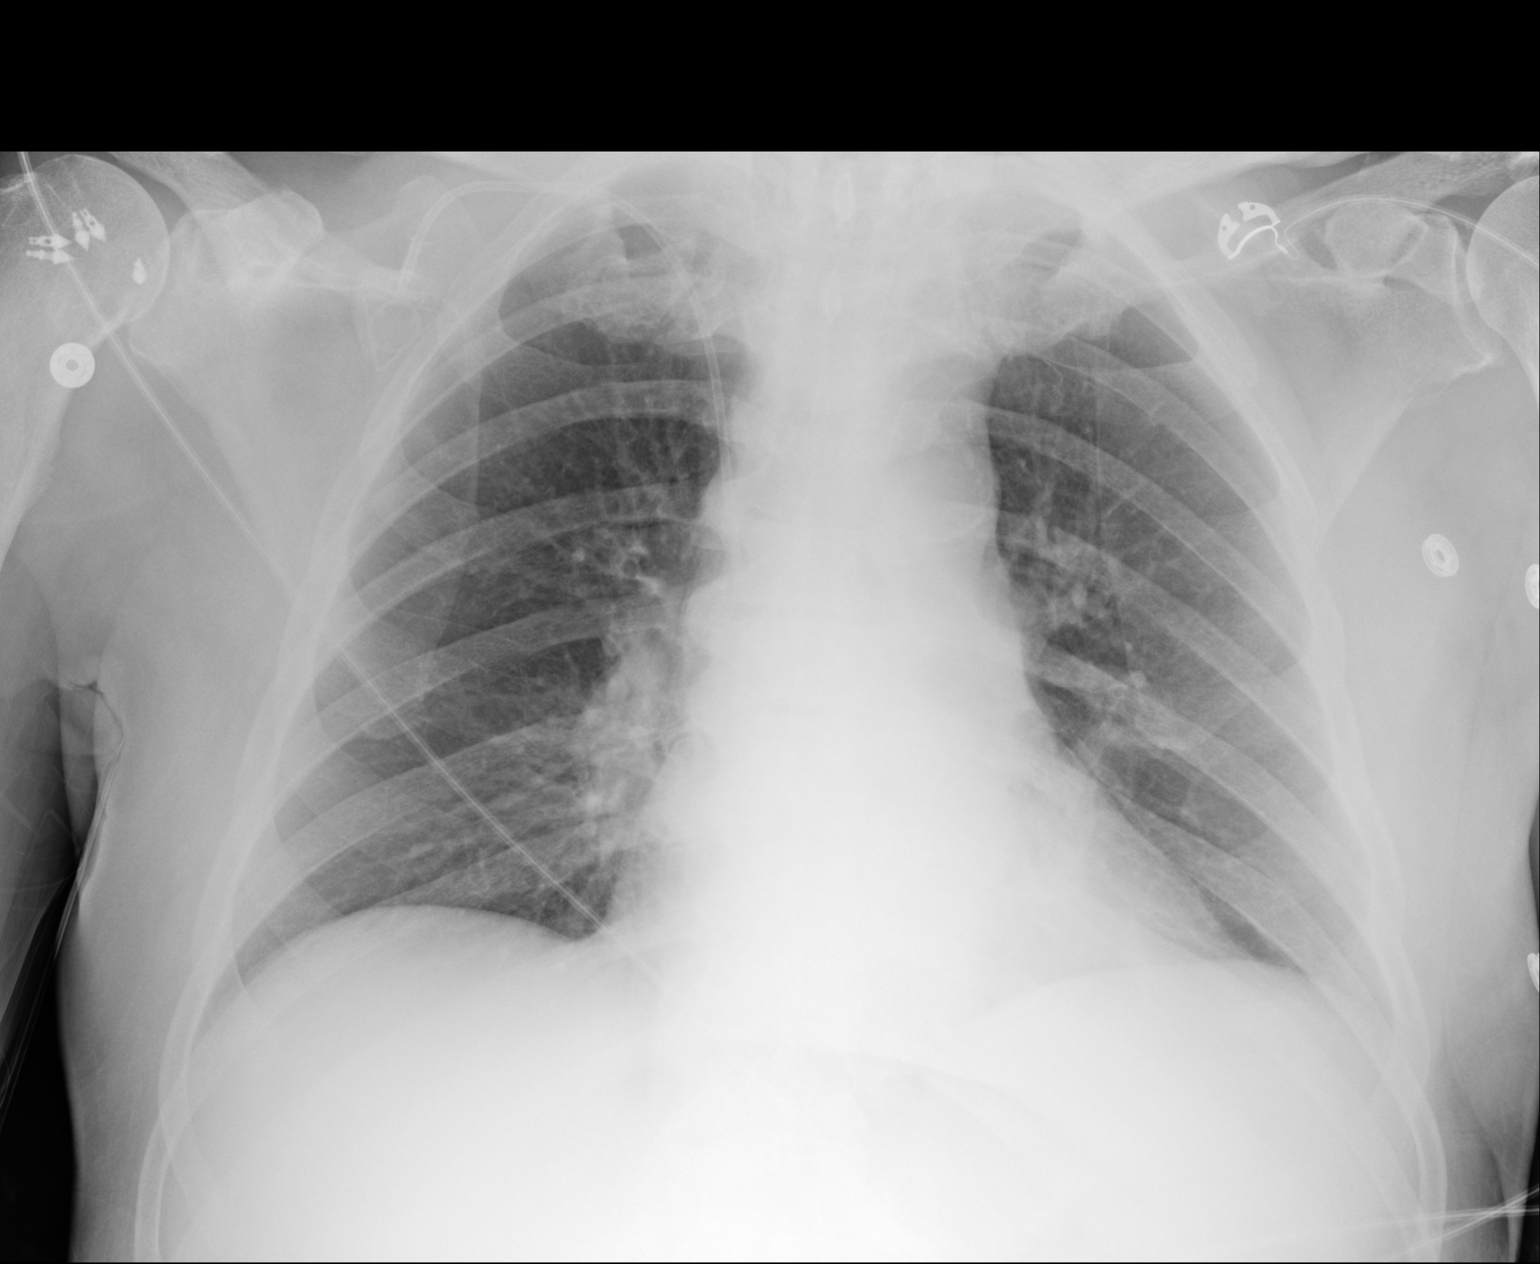

[1 of 1 positions shown; findings below may reference images not displayed]

FINDINGS: Right subclavian approach Port-A-Cath is in place with the tip in
the mid to lower superior vena cava. Tubing is intact. No
pneumothorax. Lungs clear. Heart size normal. Atherosclerosis noted.
The patient is status post right shoulder surgery.
IMPRESSION: Tip of right subclavian approach Port-A-Cath projects in the mid to
lower superior vena cava. No pneumothorax.

No acute disease.

Atherosclerosis.

## 2021-04-01 IMAGING — CT CT ABD-PELV W/ CM
3 of 6 series · 13 of 36 positions shown, 14 images · IV contrast (Omnipaque or Isovue)
Comparison: 05/09/2019

CLINICAL DATA: Fever and history of esophageal cancer.  Sepsis.

EXAM:
CT CHEST, ABDOMEN, AND PELVIS WITH CONTRAST
TECHNIQUE: Multidetector CT imaging of the chest, abdomen and pelvis was
performed following the standard protocol during bolus
administration of intravenous contrast.
CONTRAST:  100mL OMNIPAQUE IOHEXOL 300 MG/ML  SOLN

[Series 2: cap with · axial · 0.81mm/px · z∈[+855,+1085]mm · 2 of 140 slices shown, 3 images]
[im 47/140  mediastinal]
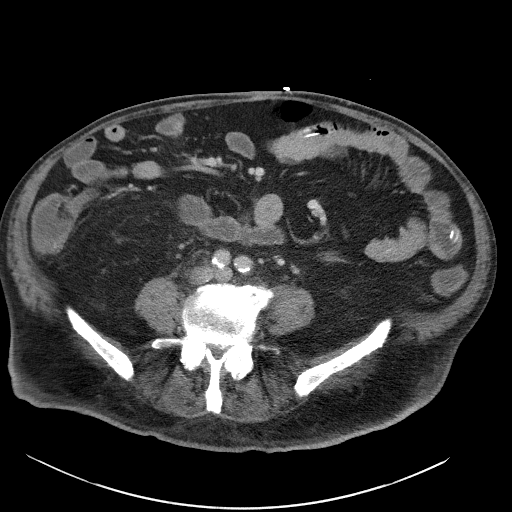
[im 47/140  lung]
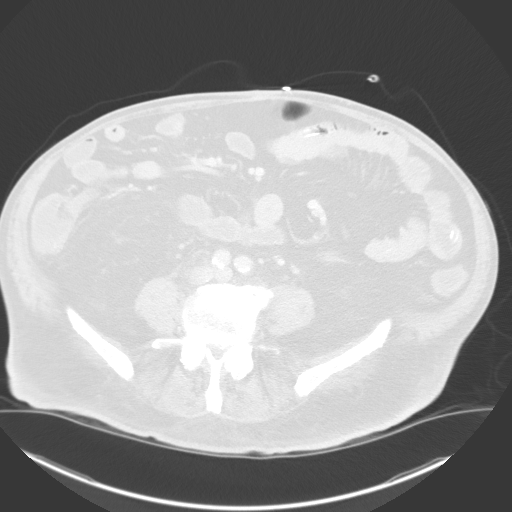
[im 93/140  lung]
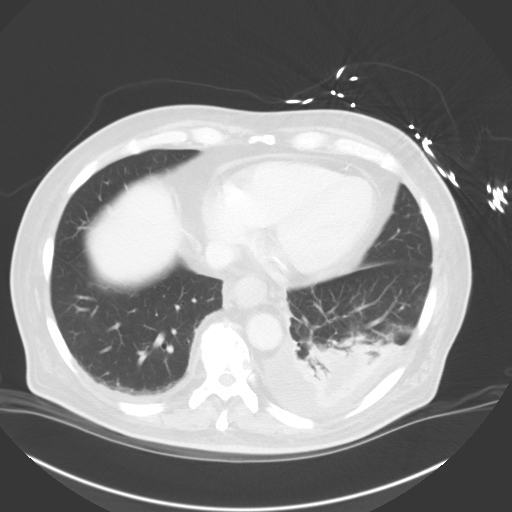

[Series 4: coronals · coronal · 0.83mm/px · 3 of 171 slices shown]
[im 35/171  lung]
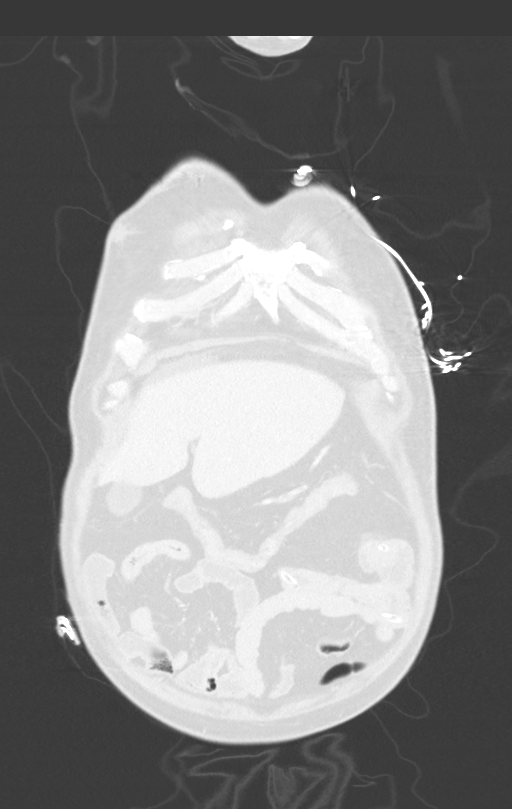
[im 69/171  lung]
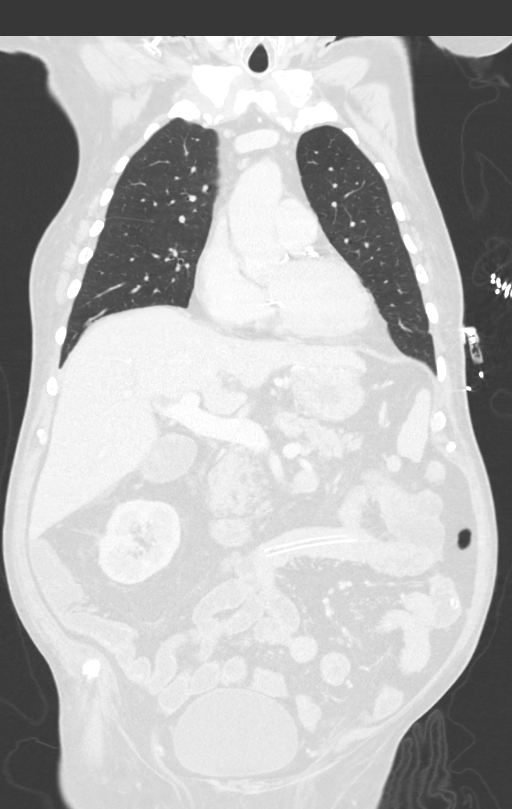
[im 103/171  lung]
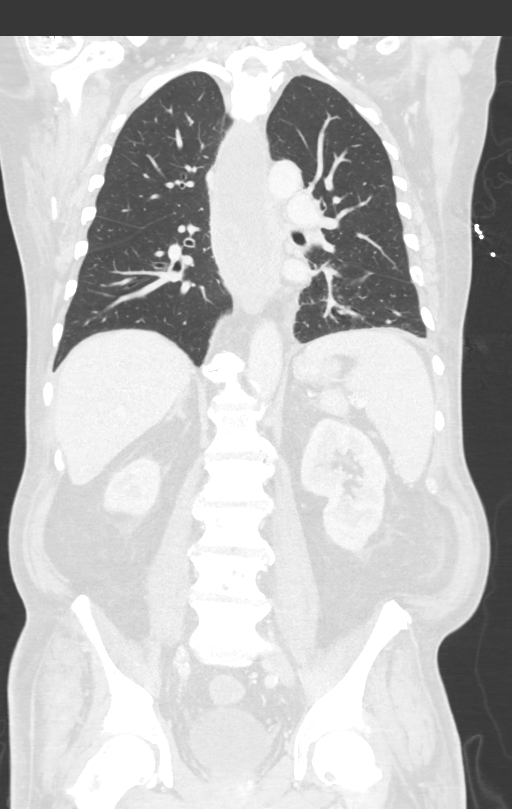

[Series 6: thins · axial · 0.80mm/px · z∈[+741,+1251]mm · 8 of 925 slices shown]
[im 98/925  lung]
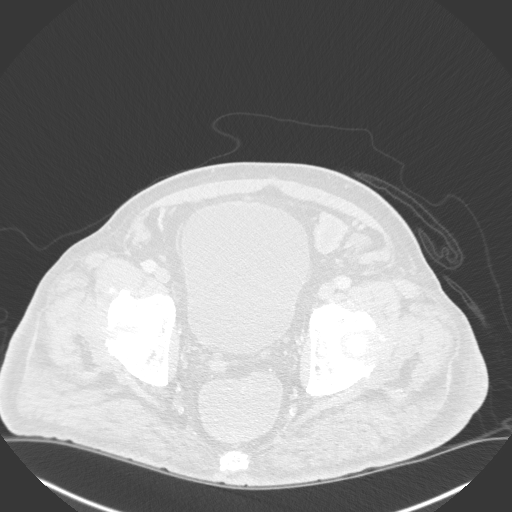
[im 195/925  lung]
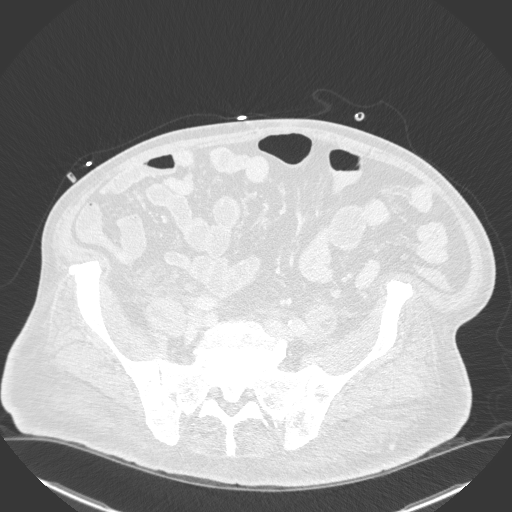
[im 292/925  lung]
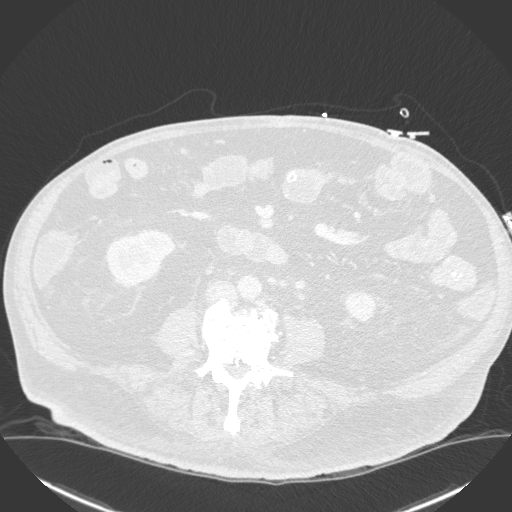
[im 390/925  lung]
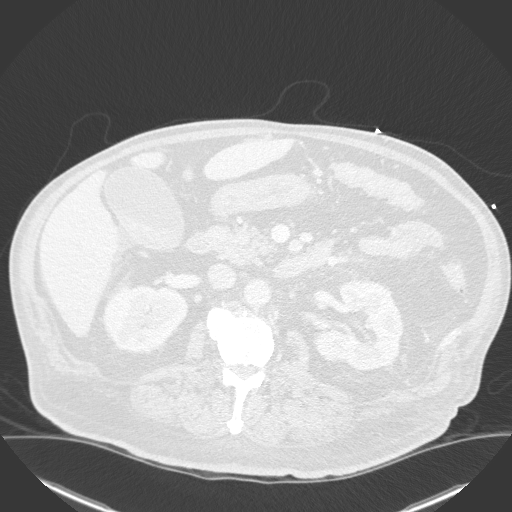
[im 535/925  lung]
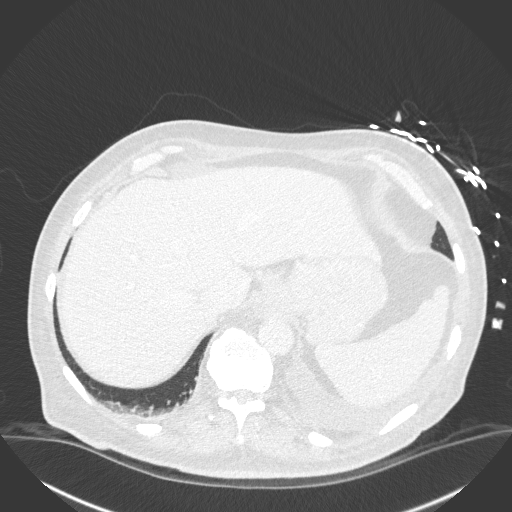
[im 633/925  lung]
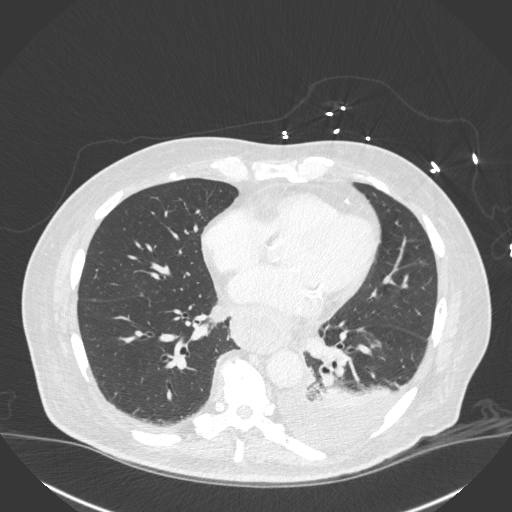
[im 730/925  lung]
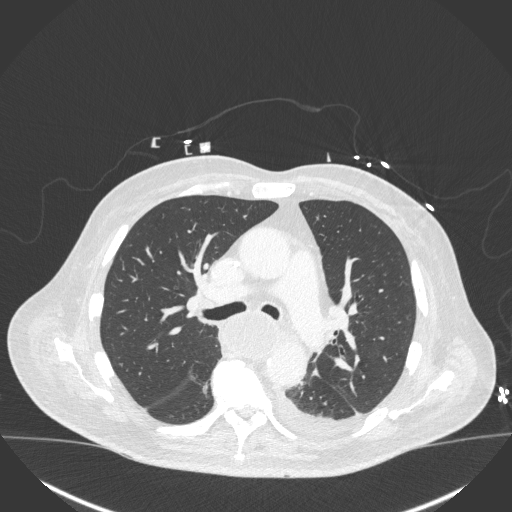
[im 827/925  lung]
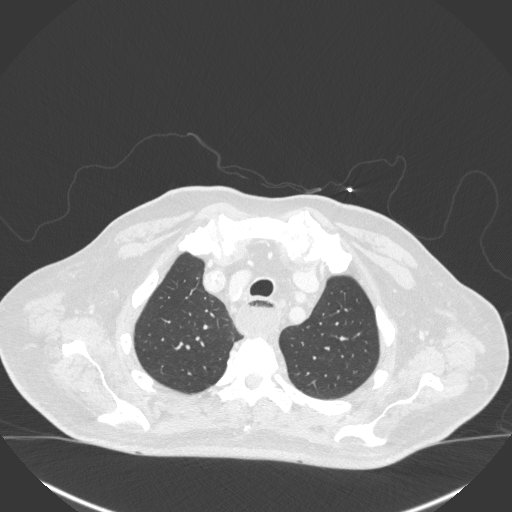

[13 of 36 positions shown; findings below may reference images not displayed]

FINDINGS: CT CHEST FINDINGS

Cardiovascular: Normal heart size. No pericardial effusion.
Extensive coronary atherosclerotic calcification. Porta catheter
with tip at the distal SVC.

Mediastinum/Nodes: Lower esophageal mass with wall thickening and
retain fluid superiorly. There is indistinct fat at the GE junction
with periesophageal nodularity likely reflecting tumor. No noted
perforation. Small bilateral thyroid nodules measuring 1 cm or
less. No followup recommended (ref: [HOSPITAL]. [DATE]): 143-50).

Lungs/Pleura: Small left pleural effusion without nodularity. Left
lower lobe atelectasis.

Musculoskeletal: Spondylosis. Subcutaneous lipoma partially covered
posterior to the right shoulder.

CT ABDOMEN PELVIS FINDINGS

Hepatobiliary: Vague 4 mm low-density in the subcapsular high right
lobe liver.Sludge or noncalcified gallstones. The gallbladder is
full but there is no acute cholecystitis.

Pancreas: Unremarkable.

Spleen: Unremarkable.

Adrenals/Urinary Tract: Small left adrenal myelolipoma. No
hydronephrosis or stone. Unremarkable bladder.

Stomach/Bowel: Fluid levels reach the distal colon. No bowel wall
thickening. Percutaneous jejunostomy tube in good position. Negative
for bowel obstruction.

Vascular/Lymphatic: No acute vascular abnormality. Diffuse
atherosclerotic calcification. Mildly enlarged lymph nodes about the
GE junction.

Reproductive:No acute finding

Other: No ascites or pneumoperitoneum.

Musculoskeletal: Extensive spondylosis. No bone metastases by recent
PET-CT.
IMPRESSION: 1. No specific cause of sepsis.
2. There is liquid stool but no colitic wall thickening.
3. Small left pleural effusion with atelectasis.
4. Known obstructive lower esophageal mass. Subcentimeter
low-density in the upper right liver, attention on follow-up.
5. Cholelithiasis.

## 2021-04-05 IMAGING — DX DG CHEST 1V PORT
1 series · 1 of 1 positions shown · non-contrast
Comparison: July 09, 2019

CLINICAL DATA: Recent Port-A-Cath removal.

EXAM:
PORTABLE CHEST 1 VIEW

[chest ap]
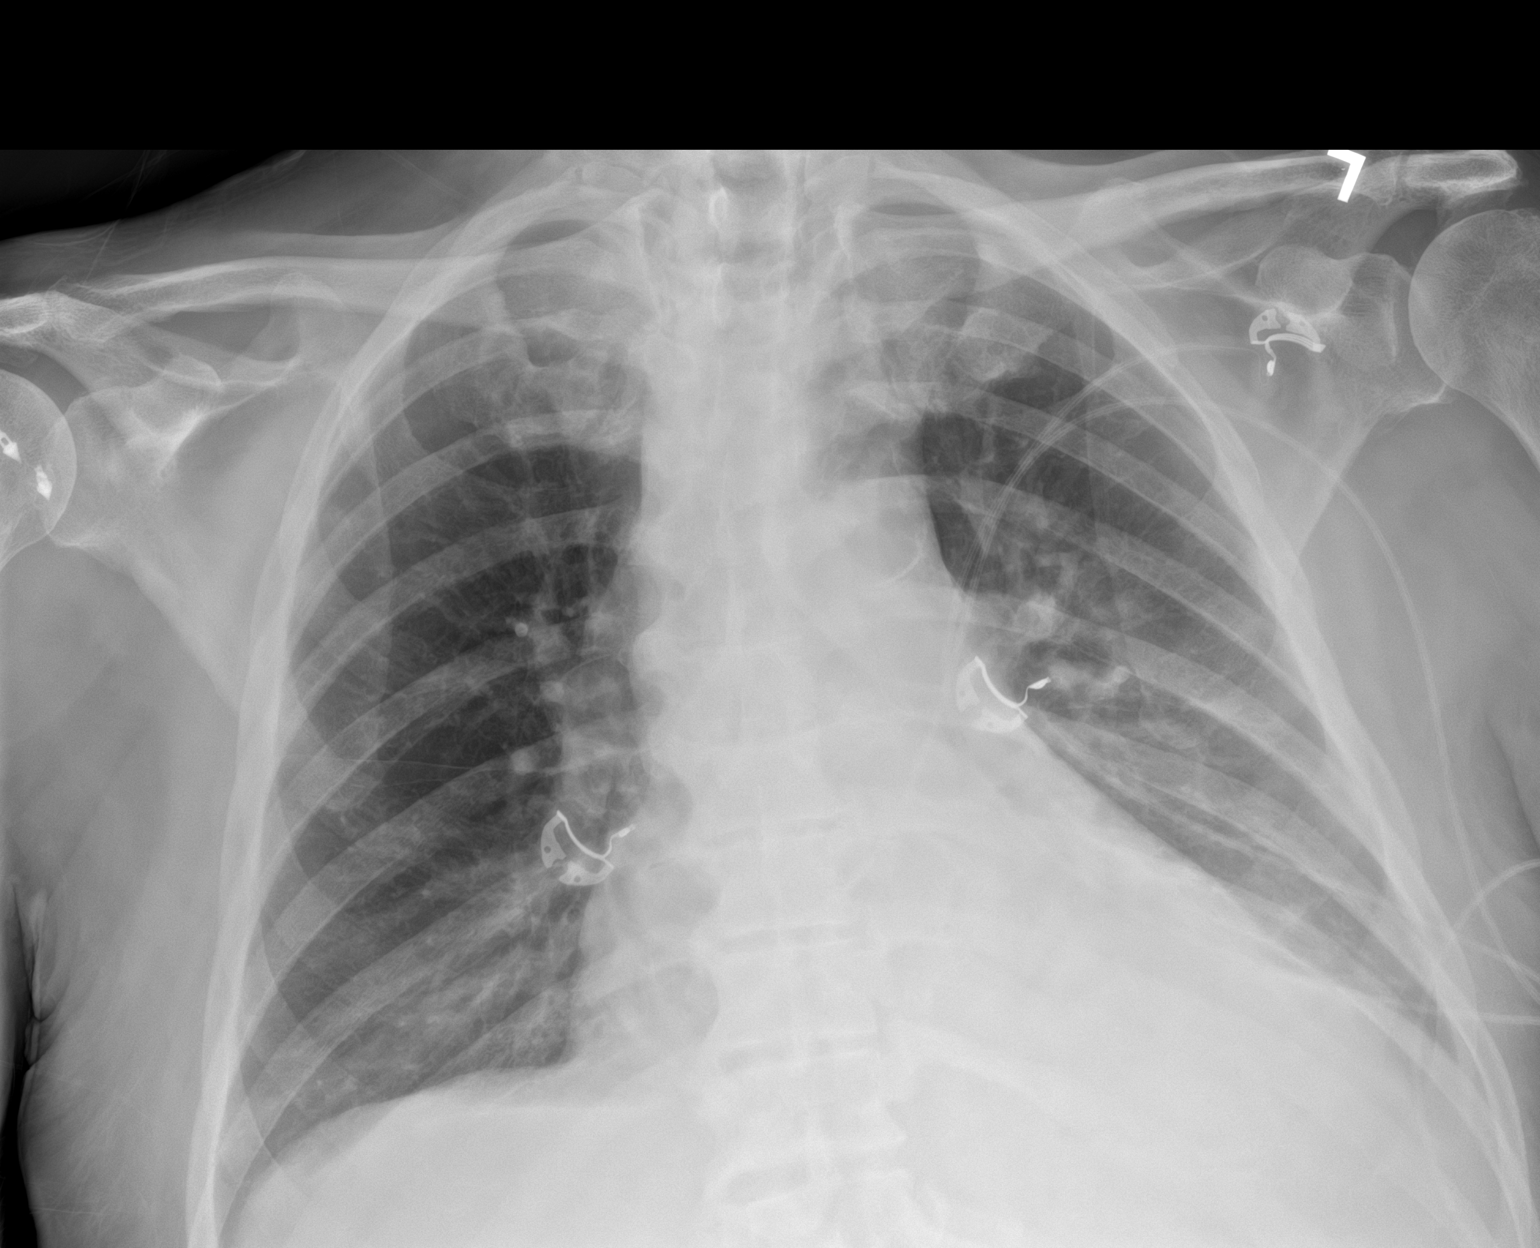

[1 of 1 positions shown; findings below may reference images not displayed]

FINDINGS: The right-sided venous Port-A-Cath seen on the prior study has been
removed.

Mild atelectasis and/or infiltrate is seen within the retrocardiac
region of the left lung base.

There is no evidence of a pleural effusion or pneumothorax.

The heart size and mediastinal contours are within normal limits.

A radiopaque fusion plate and screws are seen overlying the cervical
spine.

Radiopaque surgical pins are seen overlying the right humeral head.

Multilevel degenerative changes seen throughout the thoracic spine.
IMPRESSION: 1. Mild left basilar atelectasis and/or infiltrate.
2. Postoperative changes within the cervical spine.
